# Patient Record
Sex: Female | Born: 1980 | Race: Black or African American | Hispanic: No | Marital: Single | Smoking: Never smoker
Health system: Southern US, Community
[De-identification: ages and names within clinical notes are randomized; demographics above are authoritative.]

## PROBLEM LIST (undated history)

## (undated) DIAGNOSIS — F419 Anxiety disorder, unspecified: Secondary | ICD-10-CM

## (undated) DIAGNOSIS — K8020 Calculus of gallbladder without cholecystitis without obstruction: Secondary | ICD-10-CM

## (undated) DIAGNOSIS — U071 COVID-19: Secondary | ICD-10-CM

## (undated) DIAGNOSIS — R519 Headache, unspecified: Secondary | ICD-10-CM

## (undated) DIAGNOSIS — K219 Gastro-esophageal reflux disease without esophagitis: Secondary | ICD-10-CM

## (undated) DIAGNOSIS — K2970 Gastritis, unspecified, without bleeding: Secondary | ICD-10-CM

## (undated) DIAGNOSIS — E785 Hyperlipidemia, unspecified: Secondary | ICD-10-CM

## (undated) DIAGNOSIS — G8929 Other chronic pain: Secondary | ICD-10-CM

## (undated) DIAGNOSIS — N200 Calculus of kidney: Secondary | ICD-10-CM

## (undated) HISTORY — DX: COVID-19: U07.1

## (undated) HISTORY — PX: KNEE SURGERY: SHX244

## (undated) HISTORY — DX: Other chronic pain: G89.29

## (undated) HISTORY — DX: Gastritis, unspecified, without bleeding: K29.70

## (undated) HISTORY — DX: Headache, unspecified: R51.9

## (undated) HISTORY — DX: Calculus of kidney: N20.0

## (undated) HISTORY — DX: Hyperlipidemia, unspecified: E78.5

## (undated) HISTORY — PX: COLONOSCOPY: SHX174

## (undated) HISTORY — DX: Gastro-esophageal reflux disease without esophagitis: K21.9

## (undated) HISTORY — DX: Calculus of gallbladder without cholecystitis without obstruction: K80.20

---

## 2002-12-10 ENCOUNTER — Encounter: Payer: Self-pay | Admitting: Emergency Medicine

## 2002-12-10 ENCOUNTER — Emergency Department (HOSPITAL_COMMUNITY): Admission: EM | Admit: 2002-12-10 | Discharge: 2002-12-11 | Payer: Self-pay | Admitting: Emergency Medicine

## 2003-04-21 ENCOUNTER — Inpatient Hospital Stay (HOSPITAL_COMMUNITY): Admission: AD | Admit: 2003-04-21 | Discharge: 2003-04-21 | Payer: Self-pay | Admitting: *Deleted

## 2003-05-15 ENCOUNTER — Emergency Department (HOSPITAL_COMMUNITY): Admission: EM | Admit: 2003-05-15 | Discharge: 2003-05-15 | Payer: Self-pay | Admitting: Emergency Medicine

## 2003-09-08 ENCOUNTER — Other Ambulatory Visit: Admission: RE | Admit: 2003-09-08 | Discharge: 2003-09-08 | Payer: Self-pay | Admitting: Obstetrics and Gynecology

## 2004-02-09 ENCOUNTER — Inpatient Hospital Stay (HOSPITAL_COMMUNITY): Admission: AD | Admit: 2004-02-09 | Discharge: 2004-02-09 | Payer: Self-pay | Admitting: *Deleted

## 2004-02-12 ENCOUNTER — Inpatient Hospital Stay (HOSPITAL_COMMUNITY): Admission: AD | Admit: 2004-02-12 | Discharge: 2004-02-15 | Payer: Self-pay | Admitting: Obstetrics and Gynecology

## 2004-08-17 ENCOUNTER — Emergency Department (HOSPITAL_COMMUNITY): Admission: EM | Admit: 2004-08-17 | Discharge: 2004-08-17 | Payer: Self-pay | Admitting: Emergency Medicine

## 2004-08-17 HISTORY — PX: CHOLECYSTECTOMY: SHX55

## 2004-09-12 ENCOUNTER — Ambulatory Visit (HOSPITAL_COMMUNITY): Admission: RE | Admit: 2004-09-12 | Discharge: 2004-09-13 | Payer: Self-pay | Admitting: General Surgery

## 2004-09-12 ENCOUNTER — Encounter (INDEPENDENT_AMBULATORY_CARE_PROVIDER_SITE_OTHER): Payer: Self-pay | Admitting: *Deleted

## 2006-06-12 ENCOUNTER — Other Ambulatory Visit: Admission: RE | Admit: 2006-06-12 | Discharge: 2006-06-12 | Payer: Self-pay | Admitting: Obstetrics and Gynecology

## 2006-09-10 ENCOUNTER — Emergency Department (HOSPITAL_COMMUNITY): Admission: EM | Admit: 2006-09-10 | Discharge: 2006-09-10 | Payer: Self-pay | Admitting: Family Medicine

## 2006-09-24 ENCOUNTER — Emergency Department (HOSPITAL_COMMUNITY): Admission: EM | Admit: 2006-09-24 | Discharge: 2006-09-24 | Payer: Self-pay | Admitting: Emergency Medicine

## 2007-02-15 ENCOUNTER — Emergency Department (HOSPITAL_COMMUNITY): Admission: EM | Admit: 2007-02-15 | Discharge: 2007-02-15 | Payer: Self-pay | Admitting: Emergency Medicine

## 2008-04-27 ENCOUNTER — Emergency Department (HOSPITAL_COMMUNITY): Admission: EM | Admit: 2008-04-27 | Discharge: 2008-04-28 | Payer: Self-pay | Admitting: Emergency Medicine

## 2009-07-07 ENCOUNTER — Emergency Department (HOSPITAL_COMMUNITY): Admission: EM | Admit: 2009-07-07 | Discharge: 2009-07-07 | Payer: Self-pay | Admitting: Family Medicine

## 2009-11-20 ENCOUNTER — Emergency Department (HOSPITAL_COMMUNITY): Admission: EM | Admit: 2009-11-20 | Discharge: 2009-11-20 | Payer: Self-pay | Admitting: Emergency Medicine

## 2010-01-23 ENCOUNTER — Emergency Department (HOSPITAL_COMMUNITY): Admission: EM | Admit: 2010-01-23 | Discharge: 2010-01-24 | Payer: Self-pay | Admitting: Emergency Medicine

## 2010-02-21 ENCOUNTER — Emergency Department (HOSPITAL_COMMUNITY): Admission: EM | Admit: 2010-02-21 | Discharge: 2010-02-22 | Payer: Self-pay | Admitting: Emergency Medicine

## 2010-06-05 ENCOUNTER — Emergency Department (HOSPITAL_COMMUNITY): Admission: EM | Admit: 2010-06-05 | Discharge: 2010-06-05 | Payer: Self-pay | Admitting: Family Medicine

## 2010-09-22 ENCOUNTER — Encounter: Payer: Self-pay | Admitting: Gastroenterology

## 2010-09-27 NOTE — Letter (Signed)
Summary: New Patient letter  [facility]  [address] [place], [place] [address]   Phone: [phone]  Fax: [phone]       09/22/2010 MRN: [id number]  Amy Mendoza [address] [place], [place]  [address]  Dear Ms. Roddy,  Welcome to the Gastroenterology Division at [facility].    You are scheduled to see Dr.  Christella Hartigan on 11-01-09 at 11am on the 3rd floor at [facility], [address]  We ask that you try to arrive at our office 15 minutes prior to your appointment time to allow for check-in.  We would like you to complete the enclosed self-administered evaluation form prior to your visit and bring it with you on the day of your appointment.  We will review it with you.  Also, please bring a complete list of all your medications or, if you prefer, bring the medication bottles and we will list them.  Please bring your insurance card so that we may make a copy of it.  If your insurance requires a referral to see a specialist, please bring your referral form from your primary care physician.  Co-payments are due at the time of your visit and may be paid by cash, check or credit card.     Your office visit will consist of a consult with your physician (includes a physical exam), any laboratory testing he/she may order, scheduling of any necessary diagnostic testing (e.g. x-ray, ultrasound, CT-scan), and scheduling of a procedure (e.g. Endoscopy, Colonoscopy) if required.  Please allow enough time on your schedule to allow for any/all of these possibilities.    If you cannot keep your appointment, please call [phone] to cancel or reschedule prior to your appointment date.  This allows [place] the opportunity to schedule an appointment for another patient in need of care.  If you do not cancel or reschedule by 5 p.m. the business day prior to your appointment date, you will be charged a $50.00 late cancellation/no-show fee.    Thank you for choosing  [facility] for your medical needs.  We appreciate the opportunity to care for you.  Please visit [place] at our website  to learn more about our practice.                     Sincerely,                                                             The Gastroenterology Division

## 2010-09-30 LAB — URINALYSIS, ROUTINE W REFLEX MICROSCOPIC
Hgb urine dipstick: NEGATIVE
Ketones, ur: NEGATIVE mg/dL
Protein, ur: NEGATIVE mg/dL
Urobilinogen, UA: 0.2 mg/dL (ref 0.0–1.0)

## 2010-09-30 LAB — COMPREHENSIVE METABOLIC PANEL
ALT: 21 U/L (ref 0–35)
Calcium: 9 mg/dL (ref 8.4–10.5)
Creatinine, Ser: 0.79 mg/dL (ref 0.4–1.2)
Glucose, Bld: 79 mg/dL (ref 70–99)
Sodium: 136 mEq/L (ref 135–145)
Total Protein: 7.9 g/dL (ref 6.0–8.3)

## 2010-09-30 LAB — DIFFERENTIAL
Lymphocytes Relative: 10 % — ABNORMAL LOW (ref 12–46)
Lymphs Abs: 0.8 10*3/uL (ref 0.7–4.0)
Monocytes Relative: 6 % (ref 3–12)
Neutro Abs: 6.8 10*3/uL (ref 1.7–7.7)
Neutrophils Relative %: 83 % — ABNORMAL HIGH (ref 43–77)

## 2010-09-30 LAB — POCT PREGNANCY, URINE: Preg Test, Ur: NEGATIVE

## 2010-09-30 LAB — CBC
HCT: 42.6 % (ref 36.0–46.0)
MCH: 28.1 pg (ref 26.0–34.0)
MCHC: 33.1 g/dL (ref 30.0–36.0)
RDW: 13 % (ref 11.5–15.5)

## 2010-09-30 LAB — LIPASE, BLOOD: Lipase: 23 U/L (ref 11–59)

## 2010-09-30 LAB — LACTIC ACID, PLASMA: Lactic Acid, Venous: 1.4 mmol/L (ref 0.5–2.2)

## 2010-10-02 LAB — URINALYSIS, ROUTINE W REFLEX MICROSCOPIC
Nitrite: NEGATIVE
Specific Gravity, Urine: 1.023 (ref 1.005–1.030)
Urobilinogen, UA: 1 mg/dL (ref 0.0–1.0)
pH: 5.5 (ref 5.0–8.0)

## 2010-10-02 LAB — DIFFERENTIAL
Basophils Absolute: 0 10*3/uL (ref 0.0–0.1)
Eosinophils Relative: 4 % (ref 0–5)
Lymphocytes Relative: 37 % (ref 12–46)
Monocytes Absolute: 0.4 10*3/uL (ref 0.1–1.0)
Monocytes Relative: 6 % (ref 3–12)
Neutro Abs: 3.6 10*3/uL (ref 1.7–7.7)

## 2010-10-02 LAB — COMPREHENSIVE METABOLIC PANEL
AST: 17 U/L (ref 0–37)
Albumin: 3.5 g/dL (ref 3.5–5.2)
Alkaline Phosphatase: 61 U/L (ref 39–117)
BUN: 6 mg/dL (ref 6–23)
Chloride: 107 mEq/L (ref 96–112)
Creatinine, Ser: 0.83 mg/dL (ref 0.4–1.2)
GFR calc Af Amer: >60 mL/min (ref >60)
Potassium: 3.8 mEq/L (ref 3.5–5.1)
Total Bilirubin: 0.7 mg/dL (ref 0.3–1.2)
Total Protein: 7.2 g/dL (ref 6.0–8.3)

## 2010-10-02 LAB — CBC
Platelets: 233 10*3/uL (ref 150–400)
RBC: 4.49 MIL/uL (ref 3.87–5.11)
RDW: 13 % (ref 11.5–15.5)
WBC: 6.9 10*3/uL (ref 4.0–10.5)

## 2010-11-02 ENCOUNTER — Other Ambulatory Visit (INDEPENDENT_AMBULATORY_CARE_PROVIDER_SITE_OTHER): Payer: No Typology Code available for payment source

## 2010-11-02 ENCOUNTER — Encounter: Payer: Self-pay | Admitting: Gastroenterology

## 2010-11-02 ENCOUNTER — Ambulatory Visit (INDEPENDENT_AMBULATORY_CARE_PROVIDER_SITE_OTHER): Payer: Medicaid Other | Admitting: Gastroenterology

## 2010-11-02 VITALS — BP 106/86 | HR 68 | Ht 70.0 in | Wt 260.0 lb

## 2010-11-02 DIAGNOSIS — K625 Hemorrhage of anus and rectum: Secondary | ICD-10-CM

## 2010-11-02 DIAGNOSIS — K5900 Constipation, unspecified: Secondary | ICD-10-CM

## 2010-11-02 LAB — CBC WITH DIFFERENTIAL/PLATELET
Basophils Absolute: 0 10*3/uL (ref 0.0–0.1)
Basophils Relative: 0.3 % (ref 0.0–3.0)
Eosinophils Absolute: 0.2 10*3/uL (ref 0.0–0.7)
Hemoglobin: 13.5 g/dL (ref 12.0–15.0)
Lymphs Abs: 2.1 10*3/uL (ref 0.7–4.0)
MCHC: 34 g/dL (ref 30.0–36.0)
MCV: 85.4 fl (ref 78.0–100.0)
Monocytes Absolute: 0.4 10*3/uL (ref 0.1–1.0)
Neutro Abs: 2.5 10*3/uL (ref 1.4–7.7)
RBC: 4.64 Mil/uL (ref 3.87–5.11)
RDW: 13 % (ref 11.5–14.6)

## 2010-11-02 LAB — HEPATIC FUNCTION PANEL
Bilirubin, Direct: 0.1 mg/dL (ref 0.0–0.3)
Total Bilirubin: 0.7 mg/dL (ref 0.3–1.2)

## 2010-11-02 LAB — TSH: TSH: 0.91 u[IU]/mL (ref 0.35–5.50)

## 2010-11-02 LAB — BASIC METABOLIC PANEL
Calcium: 9.4 mg/dL (ref 8.4–10.5)
Chloride: 106 mEq/L (ref 96–112)
Creatinine, Ser: 0.9 mg/dL (ref 0.4–1.2)
GFR: 98.02 mL/min (ref >60.00)

## 2010-11-02 MED ORDER — PEG-KCL-NACL-NASULF-NA ASC-C 100 G PO SOLR: 1.0000 | ORAL | Status: DC

## 2010-11-02 NOTE — Patient Instructions (Addendum)
Please start taking citrucel (orange flavored) powder fiber supplement.  This may cause some bloating at first but that usually goes away. Begin with a small spoonful and work your way up to a large, heaping spoonful daily over a week. You will be set up for a colonoscopy. You will have labs checked today in the basement lab.  Please head down after you check out with the front desk  (cbc, bmet, lfts, tsh). A copy of this information will be made available to Dr. Concepcion Elk.

## 2010-11-02 NOTE — Progress Notes (Signed)
HPI: This is a  very pleasant 30 year old woman  Sent by Dr. Tyson Dense  abd pains for 2-3 years.  Had lap chole 2006 for stone.  She has abd pains, cramps (hot/cold sensation) about 1 hour after eating.  Can also occur without eating.  Cramping type pain.  Will have 1-2 solid stools, followed by looser stools.  Can be awoken by pains, sleeps on stomach.    Went to [facility] last summer. Had a CT scan (she was told it was normal appearing).  She had a fever at that time also.  Had normal WBC, CMEt normal exept for Tbili 1.7.  Had same thing 5 years ago in [place], was told she was constipated.  She tends to be quite constipated, can go as long as 7 days.  She has to push and strain a lot.  She has seen blood and mucous in her stool at times.  Overall she has gained 10 pounds in past year.  She takes goody's, but not very frequently.     Review of systems: Pertinent positive and negative review of systems were noted in the above HPI section.  All other review of systems was otherwise negative.   Past Medical History, Past Surgical History, Family History, Social History, Current Medications, Allergies were all reviewed with the patient via [facility] electronic medical record system.   Physical Exam: BP 106/86  Pulse 68  Ht 5\' 10"  (1.778 m)  Wt 260 lb (117.935 kg)  BMI 37.31 kg/m2  LMP 10/14/2010 Constitutional: generally well-appearing, morbidly obese Psychiatric: alert and oriented x3 Eyes: extraocular movements intact Mouth: oral pharynx moist, no lesions Neck: supple no lymphadenopathy Cardiovascular: heart regular rate and rhythm Lungs: clear to auscultation bilaterally Abdomen: soft, nontender, nondistended, no obvious ascites, no peritoneal signs, normal bowel sounds Extremities: no lower extremity edema bilaterally Skin: no lesions on visible extremities    Assessment and plan: 30 y.o. female with lower to mid abdominal pain, cramping, chronic constipation,  intermittent blood per rectum  I suspect most of her symptoms are related to her constipation and I have recommended that she begin fiber supplements. We'll get a basic set of blood work today including a CBC, complete metabolic profile, thyroid testing. Since she has seen blood in her stool we will proceed with colonoscopy at her since convenience.

## 2010-11-21 ENCOUNTER — Ambulatory Visit (AMBULATORY_SURGERY_CENTER): Payer: Medicaid Other | Admitting: Gastroenterology

## 2010-11-21 ENCOUNTER — Encounter: Payer: Self-pay | Admitting: Gastroenterology

## 2010-11-21 VITALS — BP 110/77 | HR 84 | Temp 98.8°F | Resp 20 | Ht 69.0 in | Wt 260.0 lb

## 2010-11-21 DIAGNOSIS — K625 Hemorrhage of anus and rectum: Secondary | ICD-10-CM

## 2010-11-21 DIAGNOSIS — K5900 Constipation, unspecified: Secondary | ICD-10-CM

## 2010-11-21 MED ORDER — SODIUM CHLORIDE 0.9 % IV SOLN: 500.0000 mL | INTRAVENOUS | Status: DC

## 2010-11-21 NOTE — Patient Instructions (Signed)
Discharged instructions given with verbal understanding. Recommend changing eating habits. Eat plenty of fruits and vegetables. Fiber supplements over the counter. Exercise and call back if have any further questions. Resume previous medications.

## 2010-11-22 ENCOUNTER — Telehealth: Payer: Self-pay

## 2010-11-22 NOTE — Telephone Encounter (Signed)
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
## 2010-12-02 NOTE — Op Note (Signed)
NAME:  Amy Mendoza, Amy Mendoza             ACCOUNT NO.:  [id number]   MEDICAL RECORD NO.:  [id number]          PATIENT TYPE:  OIB   LOCATION:  6126                         FACILITY:  [facility]   PHYSICIAN:  Cherylynn Ridges III, M.D.DATE OF BIRTH:  October 16, 1980   DATE OF PROCEDURE:  09/12/2004  DATE OF DISCHARGE:                                 OPERATIVE REPORT   PREOPERATIVE DIAGNOSIS:  Symptomatic cholelithiasis.   POSTOPERATIVE DIAGNOSIS:  Symptomatic cholelithiasis.   PROCEDURE:  Laparoscopic cholecystectomy with cholangiogram.   SURGEON:  Cherylynn Ridges, M.D.   ASSISTANT:  Leonie Man, M.D.   ANESTHESIA:  General endotracheal.   ESTIMATED BLOOD LOSS:  Less than 20 mL.   COMPLICATIONS:  None.   CONDITION:  Good.   INDICATIONS FOR OPERATION:  The patient is a 30 year old with symptomatic  gallstones who comes in for a laparoscopic gallbladder procedure.   FINDINGS:  The patient had a non-acutely inflamed gallbladder with multiple  small 2- to 3-mm stones contained within the gallbladder.  Cholangiogram was  normal with no evidence of common duct obstruction or intraductal filling  defect.   OPERATION:  The patient was taken to the operating room and placed on the  table in a supine position. After an adequate endotracheal anesthetic was  administered, she was prepped and draped in the usual sterile manner,  exposing the midline in the right upper quadrant.   A supraumbilical curvilinear incision was made using #11 blade and taken  down to the midline fascia.  It was at this point that Hasson technique was  assumed.  We grasped the fascia with Kocher clamps, then made an incision in  the fascia between the Kocher clamps and then bluntly dissected down through  the peritoneum using a Kelly clamp.  Once this was done, we placed a  pursestring suture of 0 Vicryl on UR6 needle. We used the Hasson cannula to  pass into the peritoneal cavity and secured it in place with the pursestring  suture.   Once this was done, we passed 2 right costal margin 5-mm cannulas, then a  subxiphoid 11/12-mm cannula under direct vision.  With all cannulas in  place, the patient was placed reversed Trendelenburg, the left side was  tilted down and the dissection begun.   The abdomen was then insufflated up to maximal pressure of 50 mmHg during  the dissection.  We grasped the dome of the gallbladder with a ratcheted  grasper through the lateral-most 5-mm cannula and a speculum was attached to  the infundibulum of the gallbladder.  We dissected out the peritoneum  overlying the triangle of Calot and hepatoduodenal triangle.  This allowed  [place] to dissect out the cystic duct and cystic artery.  Once the cystic duct  was adequately dissected out, we clipped along the gallbladder side, then  made a duodenocholedochotomy using endoscopic scissors.  It was through this  hole in the cystic duct that a Cook catheter which had been passed through  the anterior abdominal wall was passed and secured in place for the  cholangiogram.   We used half-strength Hypaque into the cholangiogram  and this demonstrated  good flow into the duodenum, no common duct obstruction, no intraductal  filling defects and no proximal dilatation.   The cholangiogram catheter was removed a long with this clip and then the  cystic duct was triply clipped distally.  We then transected the cystic duct  and then the cystic artery was clipped and transected.  We then dissected  out the gallbladder from its bed with minimal difficulty and using an  EndoCatch bag to remove it from the supraumbilical site.   Electrocautery was used to obtain hemostasis in the gallbladder bed.  We  ligated the fascia at the supraumbilical site using the pursestring suture.  Once this was done, we injected all sites with 0.25% Marcaine with  epinephrine and then we closed each site with a running subcuticular stitch  of 4-0 Vicryl.  All needle  counts, sponge counts and instrument counts were  correct that the conclusion of the case.  A sterile dressing was applied on  all sites.      JOW/MEDQ  D:  09/12/2004  T:  09/13/2004  Job:  [id number]

## 2010-12-02 NOTE — H&P (Signed)
NAMEJAZLEEN, Amy Mendoza NO.:  [id number]   MEDICAL RECORD NO.:  [id number]                   PATIENT TYPE:  INP   LOCATION:  9163                                 [facility]ILITY[facility]   PHYSICIAN:  Janine Limbo, M.D.            DATE OF BIRTH:  06-25-1981   DATE OF ADMISSION:  02/12/2004  DATE OF DISCHARGE:                                HISTORY & PHYSICAL   This is a 30 year old, gravida 2, para 0-0-1-0 at 18 4/7 weeks who presents  in active labor. She denies leaking or bleeding, reports positive fetal  movement. The pregnancy has been followed by Dr. Su Hilt and remarkable for:  1. Unsure LMP.  2. Asthma.  3. Sickle cell trait.  4. Obesity.  5. Group B strep positive.    HISTORY OF CURRENT PREGNANCY:  The patient entered care at 14 weeks. She had  some vomiting and diarrhea early in the pregnancy which resolved.  She had a  normal 18 week ultrasound.  Later on in the pregnancy, her fundal height was  increased over the normal. Ultrasound was obtained with results pending and  her group B strep was positive.   OB HISTORY:  Remarkable for an elective abortion in 2004.   PAST MEDICAL HISTORY:  Remarkable for occasional yeast infection, childhood  varicella, history of asthma for which she uses albuterol as needed.   PAST SURGICAL HISTORY:  Remarkable for a left knee surgery and an elective  abortion.   FAMILY HISTORY:  Remarkable for a grandmother with heart disease, a mother  with anemia.  Mother with bronchitis, cousins with asthma, uncle with  diabetes, father with diabetes, aunt with a neurologic disorder.   GENETIC HISTORY:  Remarkable for the patient, mother, uncle and cousins with  sickle cell trait and a cousin with sickle cell disease.   SOCIAL HISTORY:  The patient is single but involved with Lorentus Levada Schilling  who is involved and supportive. 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 She denies any alcohol,  tobacco or drug use.   PRENATAL LABS:  Hemoglobin 11.7, platelets 239, blood type O positive,  antibody screen negative, sickle cell negative, RPR nonreactive, rubella  immune, hepatitis negative, HIV negative.  Gonorrhea and chlamydia both  negative.   PHYSICAL EXAMINATION:  VITAL SIGNS:  Stable, afebrile.  HEENT:  Within normal limits.  Thyroid normal not enlarged.  CHEST:  Clear to auscultation.  HEART:  Regular rate and rhythm.  ABDOMEN:  Gravida, vertex __________,  fetal monitor denotes reactive fetal  heart rate with uterine contractions every 2-3 minutes. Cervix is 4-5, 90, -  2, vertex per RN exam.  EXTREMITIES:  Within normal limits.   ASSESSMENT:  1. Intrauterine pregnancy at 40 4/7 weeks.  2. Group B strep positive.  3. Active labor.   PLAN:  1. Admit to birthing suites per Dr. Stefano Gaul.  2. Routine MD orders.  3. Nubain and epidural for analgesia.     Marie L. Williams, C.N.M.                 Janine Limbo, M.D.    MLW/MEDQ  D:  02/12/2004  T:  02/12/2004  Job:  [id number]

## 2011-01-07 ENCOUNTER — Inpatient Hospital Stay (INDEPENDENT_AMBULATORY_CARE_PROVIDER_SITE_OTHER)
Admission: RE | Admit: 2011-01-07 | Discharge: 2011-01-07 | Disposition: A | Discharge status: Home / Self Care | Payer: Self-pay | Source: Ambulatory Visit | Attending: Family Medicine | Admitting: Family Medicine

## 2011-01-07 DIAGNOSIS — M79609 Pain in unspecified limb: Secondary | ICD-10-CM

## 2011-02-13 ENCOUNTER — Telehealth: Payer: Self-pay | Admitting: Gastroenterology

## 2011-02-14 NOTE — Telephone Encounter (Signed)
Left message on machine to call back  

## 2011-02-16 NOTE — Telephone Encounter (Signed)
i agree, thanks 

## 2011-02-16 NOTE — Telephone Encounter (Signed)
Pt called with abd cramping and diarrhea since Saturday.  She says that those are the same symptoms as she had when she saw Dr Christella Hartigan but she found out yesterday that a virus has been going around in her family with the same symptoms.  I advised her to go on clear liquids and call tomorrow if she is no better.  I will send to Dr Christella Hartigan for review.

## 2011-03-08 ENCOUNTER — Emergency Department (HOSPITAL_COMMUNITY)
Admission: EM | Admit: 2011-03-08 | Discharge: 2011-03-09 | Disposition: A | Discharge status: Home / Self Care | Payer: No Typology Code available for payment source | Attending: Emergency Medicine | Admitting: Emergency Medicine

## 2011-03-08 DIAGNOSIS — R10813 Right lower quadrant abdominal tenderness: Secondary | ICD-10-CM | POA: Insufficient documentation

## 2011-03-08 DIAGNOSIS — R110 Nausea: Secondary | ICD-10-CM | POA: Insufficient documentation

## 2011-03-08 DIAGNOSIS — R109 Unspecified abdominal pain: Secondary | ICD-10-CM | POA: Insufficient documentation

## 2011-03-09 LAB — URINALYSIS, ROUTINE W REFLEX MICROSCOPIC
Bilirubin Urine: NEGATIVE
Glucose, UA: NEGATIVE mg/dL
Ketones, ur: NEGATIVE mg/dL
Leukocytes, UA: NEGATIVE
pH: 6 (ref 5.0–8.0)

## 2011-03-13 ENCOUNTER — Telehealth: Payer: Self-pay | Admitting: Gastroenterology

## 2011-03-13 NOTE — Telephone Encounter (Signed)
Left message on machine to call back  

## 2011-03-15 NOTE — Telephone Encounter (Signed)
Left message on machine to call back if she still has questions 

## 2011-03-28 ENCOUNTER — Telehealth: Payer: Self-pay | Admitting: Gastroenterology

## 2011-03-28 DIAGNOSIS — R1013 Epigastric pain: Secondary | ICD-10-CM

## 2011-03-28 DIAGNOSIS — R194 Change in bowel habit: Secondary | ICD-10-CM

## 2011-03-28 NOTE — Telephone Encounter (Signed)
Pt having epigastric pain with cramping and alternating constipation, diarrhea.  Normal colon in April this year.  She says she feels hungry even after she eats and it feels "like something pulling in the upper abdomen".  Please advise

## 2011-03-29 NOTE — Telephone Encounter (Signed)
Ok, thanks.

## 2011-03-29 NOTE — Telephone Encounter (Signed)
Pt is taking daily fiber and will be in today for labs

## 2011-03-29 NOTE — Telephone Encounter (Signed)
Is she still taking daily fiber supplements?   She needs celiac panel, total IgA checked and should be on daily citrucel.

## 2011-04-17 LAB — CBC
Hemoglobin: 13.6
MCHC: 33.5
MCV: 83.4
RBC: 4.87

## 2011-04-17 LAB — POCT I-STAT, CHEM 8
Calcium, Ion: 1.08 — ABNORMAL LOW
Chloride: 100
Creatinine, Ser: 1
Glucose, Bld: 98
HCT: 43

## 2011-04-17 LAB — DIFFERENTIAL
Basophils Relative: 0
Lymphs Abs: 1.7
Monocytes Absolute: 0.4
Monocytes Relative: 10
Neutro Abs: 2

## 2011-05-08 ENCOUNTER — Inpatient Hospital Stay (INDEPENDENT_AMBULATORY_CARE_PROVIDER_SITE_OTHER)
Admission: RE | Admit: 2011-05-08 | Discharge: 2011-05-08 | Disposition: A | Discharge status: Home / Self Care | Payer: Self-pay | Source: Ambulatory Visit | Attending: Emergency Medicine | Admitting: Emergency Medicine

## 2011-05-08 DIAGNOSIS — N760 Acute vaginitis: Secondary | ICD-10-CM

## 2011-05-08 LAB — POCT URINALYSIS DIP (DEVICE)
Hgb urine dipstick: NEGATIVE
Protein, ur: NEGATIVE mg/dL
Specific Gravity, Urine: 1.02 (ref 1.005–1.030)
Urobilinogen, UA: 0.2 mg/dL (ref 0.0–1.0)
pH: 7.5 (ref 5.0–8.0)

## 2011-05-08 LAB — WET PREP, GENITAL: Clue Cells Wet Prep HPF POC: NONE SEEN

## 2011-05-08 LAB — POCT PREGNANCY, URINE: Preg Test, Ur: NEGATIVE

## 2011-05-09 LAB — GC/CHLAMYDIA PROBE AMP, GENITAL
Chlamydia, DNA Probe: NEGATIVE
GC Probe Amp, Genital: NEGATIVE

## 2012-04-08 ENCOUNTER — Emergency Department (INDEPENDENT_AMBULATORY_CARE_PROVIDER_SITE_OTHER)
Admission: EM | Admit: 2012-04-08 | Discharge: 2012-04-08 | Disposition: A | Discharge status: Home / Self Care | Payer: Self-pay | Source: Home / Self Care | Attending: Family Medicine | Admitting: Family Medicine

## 2012-04-08 ENCOUNTER — Encounter (HOSPITAL_COMMUNITY): Payer: Self-pay | Admitting: *Deleted

## 2012-04-08 DIAGNOSIS — M79651 Pain in right thigh: Secondary | ICD-10-CM

## 2012-04-08 DIAGNOSIS — M545 Low back pain: Secondary | ICD-10-CM

## 2012-04-08 DIAGNOSIS — G5711 Meralgia paresthetica, right lower limb: Secondary | ICD-10-CM

## 2012-04-08 DIAGNOSIS — M79609 Pain in unspecified limb: Secondary | ICD-10-CM

## 2012-04-08 DIAGNOSIS — G5710 Meralgia paresthetica, unspecified lower limb: Secondary | ICD-10-CM

## 2012-04-08 LAB — TSH: TSH: 1.233 u[IU]/mL (ref 0.350–4.500)

## 2012-04-08 MED ORDER — PREDNISONE 20 MG PO TABS: ORAL_TABLET | ORAL | Status: DC

## 2012-04-08 MED ORDER — ALBUTEROL SULFATE (5 MG/ML) 0.5% IN NEBU: 5.0000 mg | INHALATION_SOLUTION | Freq: Once | RESPIRATORY_TRACT | Status: DC

## 2012-04-08 MED ORDER — CYCLOBENZAPRINE HCL 10 MG PO TABS: 10.0000 mg | ORAL_TABLET | Freq: Two times a day (BID) | ORAL | Status: DC | PRN

## 2012-04-08 MED ORDER — NAPROXEN 500 MG PO TABS: 500.0000 mg | ORAL_TABLET | Freq: Two times a day (BID) | ORAL | Status: DC

## 2012-04-08 MED ORDER — TRAMADOL HCL 50 MG PO TABS: 50.0000 mg | ORAL_TABLET | Freq: Four times a day (QID) | ORAL | Status: DC | PRN

## 2012-04-08 MED ORDER — IPRATROPIUM BROMIDE 0.02 % IN SOLN: 0.5000 mg | Freq: Once | RESPIRATORY_TRACT | Status: DC

## 2012-04-08 NOTE — ED Provider Notes (Signed)
History     CSN: [id number]  Arrival date & time 04/08/12  1107   First MD Initiated Contact with Patient 04/08/12 1242      Chief Complaint  Patient presents with  . Leg Pain    (Consider location/radiation/quality/duration/timing/severity/associated sxs/prior treatment) HPI Comments: 31 year old female with history of obesity here complaining of 2 weeks history of right thigh pain associated with right lower back pain. Patient denies any recent increase in her physical activity or known injury. 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 She describes her pain as a burning type superficial sensation in the side and anterior to her right upper thigh, pain starts close to the pelvic area. She denies dysuria or hematuria. She denies pelvic pain or vaginal discharge. Pain is worse with superficial touching of the thigh skin. Denies significant difficulty with walking or limping. You have problems with muscle aches in the past. Denies taking statins. Patient has tried several times to start a walking routine to help with her obesity.   Past Medical History  Diagnosis Date  . Asthma   . Gallstones   . Hyperlipemia     Past Surgical History  Procedure Date  . Cholecystectomy feb 2006  . Knee surgery     left    Family History  Problem Relation Age of Onset  . Diabetes Father     MGM, maternal uncle  . Prostate cancer Maternal Grandfather   . Breast cancer Maternal Aunt   . Colon cancer Neg Hx     History  Substance Use Topics  . Smoking status: Never Smoker   . Smokeless tobacco: Never Used  . Alcohol Use: No     occasionally    OB History    Grav Para Term Preterm Abortions TAB SAB Ect Mult Living                  Review of Systems  Constitutional: Negative for fever and chills.  Gastrointestinal: Negative for nausea, vomiting, abdominal pain, diarrhea,  constipation and abdominal distention.  Genitourinary: Negative for dysuria, frequency, hematuria, flank pain, vaginal bleeding, vaginal discharge, menstrual problem and pelvic pain.  Musculoskeletal: Positive for myalgias and back pain. Negative for joint swelling.  Skin: Negative for rash.  Neurological: Negative for dizziness and headaches.  All other systems reviewed and are negative.    Allergies  Review of patient's allergies indicates no known allergies.  Home Medications   Current Outpatient Rx  Name Route Sig Dispense Refill  . CYCLOBENZAPRINE HCL 10 MG PO TABS Oral Take 1 tablet (10 mg total) by mouth 2 (two) times daily as needed for muscle spasms. 20 tablet 0  . ULTIMATE PROBIOTIC FORMULA PO CAPS Oral Take 1 capsule by mouth daily as needed.      Marland Kitchen NAPROXEN 500 MG PO TABS Oral Take 1 tablet (500 mg total) by mouth 2 (two) times daily. 20 tablet 0  . PREDNISONE 20 MG PO TABS  2 tabs by mouth daily for 5 days. 10 tablet 0  . TRAMADOL HCL 50 MG PO TABS Oral Take 1 tablet (50 mg total) by mouth every 6 (six) hours as needed for pain. 15 tablet 0    BP 115/61  Pulse 64  Temp 98.6 F (37 C) (Oral)  Resp 20  SpO2 100%  LMP 03/18/2012  Physical Exam  Nursing note  and vitals reviewed. Constitutional: She is oriented to person, place, and time. She appears well-developed and well-nourished. No distress.  HENT:  Head: Normocephalic.  Mouth/Throat: Oropharynx is clear and moist.  Eyes: Conjunctivae normal are normal.  Neck: Neck supple.       Thyroid feels full. No focal nodes palpated.  Cardiovascular: Normal heart sounds.   Pulmonary/Chest: Breath sounds normal.  Abdominal: Soft. Bowel sounds are normal. She exhibits no distension. There is no tenderness.       No costovertebral tenderness. No inguinal hernias.  Musculoskeletal:       Spine: Central. Fair range of motion despite morbid obesity. No pain with palpation of bone processes. Impress increased muscle tone  and tenderness to palpation over right lumbar paravertebral muscles.  Right hip/right thigh: Fair internal and external rotation of the right hip despite of morbid obesity. No clunks or crepitus. Negative straight leg test. Impress hyperesthesia to light touch over upper anterior and lateral thigh. No numbness. Specifically no saddle anesthesia. Otherwise entire right lower extremity appears neurovascularly intact.   Lymphadenopathy:    She has no cervical adenopathy.  Neurological: She is alert and oriented to person, place, and time.  Skin: No rash noted. She is not diaphoretic.    ED Course  Procedures (including critical care time)   Labs Reviewed  TSH   No results found.   1. Right thigh pain   2. Meralgia paresthetica of right side   3. Low back pain radiating to right leg       MDM  Impress low back pain associated with peripheral neuropathy. Has otherwise no history or clinical findings suggestive of cord compression. Decided to treat with Flexeril, naproxen, prednisone and tramadol. Asked to followup with a primary care provider or spine specialist if persistent or worsening symptoms. Encouraged back strengthening exercises and weight management.        Sharin Grave, MD 04/10/12 [phone]

## 2012-04-08 NOTE — ED Notes (Signed)
Pt reports  r  Leg pain  With  Pain radiating  Down r  Leg   denys  Any  Injury    Symptoms  X  2  Weeks   Ambulatory    To  Exam   Room     With     Slow  Steady  Gait

## 2013-03-14 ENCOUNTER — Ambulatory Visit: Payer: Self-pay | Admitting: Gastroenterology

## 2013-03-14 ENCOUNTER — Telehealth: Payer: Self-pay | Admitting: Gastroenterology

## 2013-03-14 NOTE — Telephone Encounter (Signed)
No do not bill

## 2013-08-07 ENCOUNTER — Telehealth: Payer: Self-pay | Admitting: Gastroenterology

## 2013-08-08 NOTE — Telephone Encounter (Signed)
Periumbilical pain "like contractions" and constipation, early satiety. Nausea all the time Off/on for a month.  Nothing makes it worse or better, she tried to alter her diet with no response PCP gave protonix and carafate and only had relief for about a week.  Appt with Amy for 08/12/13 3 pm

## 2013-08-12 ENCOUNTER — Ambulatory Visit: Payer: Self-pay | Admitting: Physician Assistant

## 2013-08-12 ENCOUNTER — Telehealth: Payer: Self-pay | Admitting: Gastroenterology

## 2013-08-12 ENCOUNTER — Ambulatory Visit: Payer: Self-pay | Admitting: Gastroenterology

## 2013-08-12 NOTE — Telephone Encounter (Signed)
Do not bill 

## 2013-09-12 ENCOUNTER — Ambulatory Visit: Payer: Self-pay | Admitting: Gastroenterology

## 2013-10-10 ENCOUNTER — Ambulatory Visit: Payer: Self-pay | Admitting: Gastroenterology

## 2014-02-09 ENCOUNTER — Ambulatory Visit: Payer: Self-pay | Admitting: Family Medicine

## 2014-02-23 ENCOUNTER — Emergency Department (HOSPITAL_COMMUNITY): Payer: Medicaid Other

## 2014-02-23 ENCOUNTER — Encounter (HOSPITAL_COMMUNITY): Payer: Self-pay | Admitting: Emergency Medicine

## 2014-02-23 ENCOUNTER — Emergency Department (HOSPITAL_COMMUNITY)
Admission: EM | Admit: 2014-02-23 | Discharge: 2014-02-23 | Disposition: A | Discharge status: Home / Self Care | Payer: Medicaid Other | Attending: Emergency Medicine | Admitting: Emergency Medicine

## 2014-02-23 DIAGNOSIS — Y9389 Activity, other specified: Secondary | ICD-10-CM | POA: Diagnosis not present

## 2014-02-23 DIAGNOSIS — W230XXA Caught, crushed, jammed, or pinched between moving objects, initial encounter: Secondary | ICD-10-CM | POA: Diagnosis not present

## 2014-02-23 DIAGNOSIS — Z862 Personal history of diseases of the blood and blood-forming organs and certain disorders involving the immune mechanism: Secondary | ICD-10-CM | POA: Insufficient documentation

## 2014-02-23 DIAGNOSIS — IMO0002 Reserved for concepts with insufficient information to code with codable children: Secondary | ICD-10-CM | POA: Insufficient documentation

## 2014-02-23 DIAGNOSIS — Y9289 Other specified places as the place of occurrence of the external cause: Secondary | ICD-10-CM | POA: Diagnosis not present

## 2014-02-23 DIAGNOSIS — Z8639 Personal history of other endocrine, nutritional and metabolic disease: Secondary | ICD-10-CM | POA: Insufficient documentation

## 2014-02-23 DIAGNOSIS — S6980XA Other specified injuries of unspecified wrist, hand and finger(s), initial encounter: Secondary | ICD-10-CM | POA: Insufficient documentation

## 2014-02-23 DIAGNOSIS — S6992XA Unspecified injury of left wrist, hand and finger(s), initial encounter: Secondary | ICD-10-CM

## 2014-02-23 DIAGNOSIS — Z791 Long term (current) use of non-steroidal anti-inflammatories (NSAID): Secondary | ICD-10-CM | POA: Insufficient documentation

## 2014-02-23 DIAGNOSIS — Z8719 Personal history of other diseases of the digestive system: Secondary | ICD-10-CM | POA: Diagnosis not present

## 2014-02-23 DIAGNOSIS — S6990XA Unspecified injury of unspecified wrist, hand and finger(s), initial encounter: Principal | ICD-10-CM | POA: Insufficient documentation

## 2014-02-23 DIAGNOSIS — J45909 Unspecified asthma, uncomplicated: Secondary | ICD-10-CM | POA: Insufficient documentation

## 2014-02-23 MED ORDER — HYDROCODONE-ACETAMINOPHEN 5-325 MG PO TABS: 1.0000 | ORAL_TABLET | Freq: Four times a day (QID) | ORAL | Status: DC | PRN

## 2014-02-23 MED ORDER — HYDROCODONE-ACETAMINOPHEN 5-325 MG PO TABS
1.0000 | ORAL_TABLET | Freq: Once | ORAL | Status: AC
  Administered 2014-02-23: 1 via ORAL
  Filled 2014-02-23: qty 1

## 2014-02-23 NOTE — ED Provider Notes (Signed)
Medical screening examination/treatment/procedure(s) were performed by non-physician practitioner and as supervising physician I was immediately available for consultation/collaboration.   EKG Interpretation None        Dehaven Sine, MD 02/23/14 0620 

## 2014-02-23 NOTE — ED Provider Notes (Signed)
CSN: [id number]     Arrival date & time 02/23/14  0028 History   First MD Initiated Contact with Patient 02/23/14 0050     Chief Complaint  Patient presents with  . Finger Injury     (Consider location/radiation/quality/duration/timing/severity/associated sxs/prior Treatment) HPI Comments: Patient presents emergency department with chief complaint of finger jammed. She states that her left thumb was closed in a closet door this evening by her son. She reports moderate pain. It is worse with movement. She has not tried taking anything to alleviate her symptoms. She denies any bleeding or wounds. The pain radiates to her hand.  The history is provided by the patient. No language interpreter was used.    Past Medical History  Diagnosis Date  . Asthma   . Gallstones   . Hyperlipemia    Past Surgical History  Procedure Laterality Date  . Cholecystectomy  feb 2006  . Knee surgery      left   Family History  Problem Relation Age of Onset  . Diabetes Father     MGM, maternal uncle  . Prostate cancer Maternal Grandfather   . Breast cancer Maternal Aunt   . Colon cancer Neg Hx    History  Substance Use Topics  . Smoking status: Never Smoker   . Smokeless tobacco: Never Used  . Alcohol Use: No     Comment: occasionally   OB History   Grav Para Term Preterm Abortions TAB SAB Ect Mult Living                 Review of Systems  Constitutional: Negative for fever and chills.  Respiratory: Negative for shortness of breath.   Cardiovascular: Negative for chest pain.  Gastrointestinal: Negative for nausea, vomiting, diarrhea and constipation.  Genitourinary: Negative for dysuria.  Musculoskeletal: Positive for arthralgias.      Allergies  Review of patient's allergies indicates no known allergies.  Home Medications   Prior to Admission medications   Medication Sig Start Date End Date Taking? Authorizing Provider  cyclobenzaprine (FLEXERIL) 10 MG tablet Take 1 tablet (10  mg total) by mouth 2 (two) times daily as needed for muscle spasms. 04/08/12   Adlih Moreno-Coll, MD  HYDROcodone-acetaminophen (NORCO/VICODIN) 5-325 MG per tablet Take 1 tablet by mouth every 6 (six) hours as needed for moderate pain or severe pain. 02/23/14   Roxy Horsemanobert Kentavius Dettore, PA-C  Lactobacillus (ULTIMATE PROBIOTIC FORMULA) CAPS Take 1 capsule by mouth daily as needed.      Historical Provider, MD  naproxen (NAPROSYN) 500 MG tablet Take 1 tablet (500 mg total) by mouth 2 (two) times daily. 04/08/12   Adlih Moreno-Coll, MD  predniSONE (DELTASONE) 20 MG tablet 2 tabs by mouth daily for 5 days. 04/08/12   Adlih Moreno-Coll, MD  traMADol (ULTRAM) 50 MG tablet Take 1 tablet (50 mg total) by mouth every 6 (six) hours as needed for pain. 04/08/12   Adlih Moreno-Coll, MD   BP 123/68  Pulse 70  Temp(Src) 97.7 F (36.5 C) (Oral)  Resp 14  Ht 5\' 8"  (1.727 m)  Wt 262 lb (118.842 kg)  BMI 39.85 kg/m2  SpO2 98%  LMP 12/02/2013 Physical Exam  Nursing note and vitals reviewed. Constitutional: She is oriented to person, place, and time. She appears well-developed and well-nourished.  HENT:  Head: Normocephalic and atraumatic.  Eyes: Conjunctivae and EOM are normal.  Neck: Normal range of motion.  Cardiovascular: Normal rate.   Pulmonary/Chest: Effort normal.  Abdominal: She exhibits no distension.  Musculoskeletal:  Normal range of motion.  Left thumb range of motion and strength 5/5, however moderately tender to palpation over the distal phalanx  Neurological: She is alert and oriented to person, place, and time.  Skin: Skin is dry.  Very small subungual hematoma of the left thumbnail  Psychiatric: She has a normal mood and affect. Her behavior is normal. Judgment and thought content normal.    ED Course  Procedures (including critical care time) Labs Review Labs Reviewed - No data to display  Imaging Review Dg Finger Thumb Left  02/23/2014   CLINICAL DATA:  Smashed left thumb in closet door,  with bruising and pain.  EXAM: LEFT THUMB 2+V  COMPARISON:  None.  FINDINGS: The left thumb appears grossly intact. There is no evidence of fracture or dislocation. Known soft tissue injury is not well characterized on radiograph. Visualized joint spaces are preserved.  IMPRESSION: No evidence of fracture or dislocation.   Electronically Signed   By: Roanna Raider M.D.   On: 02/23/2014 01:55     EKG Interpretation None      MDM   Final diagnoses:  Jammed finger (interphalangeal joint), left, initial encounter    Patient with injured finger after jamming it in a door. No broken bones. No laceration. Discharged home with ice and pain medicine.    Roxy Horseman, PA-C 02/23/14 [phone]

## 2014-02-23 NOTE — ED Notes (Signed)
Pt to Xray.

## 2014-02-23 NOTE — ED Notes (Signed)
Pt reports slamming finger into door.  Hematoma to left thumb nail.  CNS intact.  Limited ROM secondary to pain.

## 2014-02-23 NOTE — Discharge Instructions (Signed)
Fingertip Injuries and Amputations Fingertip injuries are common and often get injured because they are last to escape when pulling your hand out of harm's way. You have amputated (cut off) part of your finger. How this turns out depends largely on how much was amputated. If just the tip is amputated, often the end of the finger will grow back and the finger may return to much the same as it was before the injury.  If more of the finger is missing, your caregiver has done the best with the tissue remaining to allow you to keep as much finger as is possible. Your caregiver after checking your injury has tried to leave you with a painless fingertip that has durable, feeling skin. If possible, your caregiver has tried to maintain the finger's length and appearance and preserve its fingernail.  Please read the instructions outlined below and refer to this sheet in the next few weeks. These instructions provide you with general information on caring for yourself. Your caregiver may also give you specific instructions. While your treatment has been done according to the most current medical practices available, unavoidable complications occasionally occur. If you have any problems or questions after discharge, please call your caregiver. HOME CARE INSTRUCTIONS   You may resume normal diet and activities as directed or allowed.  Keep your hand elevated above the level of your heart. This helps decrease pain and swelling.  Keep ice packs (or a bag of ice wrapped in a towel) on the injured area for 15-20 minutes, 03-04 times per day, for the first two days.  Change dressings if necessary or as directed.  Clean the wound daily or as directed.  Only take over-the-counter or prescription medicines for pain, discomfort, or fever as directed by your caregiver.  Keep appointments as directed. SEEK IMMEDIATE MEDICAL CARE IF:  You develop redness, swelling, numbness or increasing pain in the wound.  There is  pus coming from the wound.  You develop an unexplained oral temperature above 102 F (38.9 C) or as your caregiver suggests.  There is a foul (bad) smell coming from the wound or dressing.  There is a breaking open of the wound (edges not staying together) after sutures or staples have been removed. MAKE SURE YOU:   Understand these instructions.  Will watch your condition.  Will get help right away if you are not doing well or get worse. Document Released: 05/24/2005 Document Revised: 09/25/2011 Document Reviewed: 04/22/2008 [facility] Patient Information 2015 [place]. This information is not intended to replace advice given to you by your health care provider. Make sure you discuss any questions you have with your health care provider.  Blunt Trauma You have been evaluated for injuries. You have been examined and your caregiver has not found injuries serious enough to require hospitalization. It is common to have multiple bruises and sore muscles following an accident. These tend to feel worse for the first 24 hours. You will feel more stiffness and soreness over the next several hours and worse when you wake up the first morning after your accident. After this point, you should begin to improve with each passing day. The amount of improvement depends on the amount of damage done in the accident. Following your accident, if some part of your body does not work as it should, or if the pain in any area continues to increase, you should return to the Emergency Department for re-evaluation.  HOME CARE INSTRUCTIONS  Routine care for sore areas should  include:  Ice to sore areas every 2 hours for 20 minutes while awake for the next 2 days.  Drink extra fluids (not alcohol).  Take a hot or warm shower or bath once or twice a day to increase blood flow to sore muscles. This will help you "limber up".  Activity as tolerated. Lifting may aggravate neck or back pain.  Only take  over-the-counter or prescription medicines for pain, discomfort, or fever as directed by your caregiver. Do not use aspirin. This may increase bruising or increase bleeding if there are small areas where this is happening. SEEK IMMEDIATE MEDICAL CARE IF:  Numbness, tingling, weakness, or problem with the use of your arms or legs.  A severe headache is not relieved with medications.  There is a change in bowel or bladder control.  Increasing pain in any areas of the body.  Short of breath or dizzy.  Nauseated, vomiting, or sweating.  Increasing belly (abdominal) discomfort.  Blood in urine, stool, or vomiting blood.  Pain in either shoulder in an area where a shoulder strap would be.  Feelings of lightheadedness or if you have a fainting episode. Sometimes it is not possible to identify all injuries immediately after the trauma. It is important that you continue to monitor your condition after the emergency department visit. If you feel you are not improving, or improving more slowly than should be expected, call your physician. If you feel your symptoms (problems) are worsening, return to the Emergency Department immediately. Document Released: 03/29/2001 Document Revised: 09/25/2011 Document Reviewed: 02/19/2008 [facility] Patient Information 2015 [place], [place]. This information is not intended to replace advice given to you by your health care provider. Make sure you discuss any questions you have with your health care provider.

## 2014-02-23 NOTE — ED Notes (Signed)
Pt. accidentally injured her left thumb against closet door this evening , presents with mild swelling and bruise at nailbed .

## 2014-04-07 DIAGNOSIS — R05 Cough: Secondary | ICD-10-CM | POA: Insufficient documentation

## 2014-04-07 DIAGNOSIS — Z8639 Personal history of other endocrine, nutritional and metabolic disease: Secondary | ICD-10-CM | POA: Insufficient documentation

## 2014-04-07 DIAGNOSIS — Z8719 Personal history of other diseases of the digestive system: Secondary | ICD-10-CM | POA: Insufficient documentation

## 2014-04-07 DIAGNOSIS — Z862 Personal history of diseases of the blood and blood-forming organs and certain disorders involving the immune mechanism: Secondary | ICD-10-CM | POA: Insufficient documentation

## 2014-04-07 DIAGNOSIS — Z791 Long term (current) use of non-steroidal anti-inflammatories (NSAID): Secondary | ICD-10-CM | POA: Diagnosis not present

## 2014-04-07 DIAGNOSIS — Z792 Long term (current) use of antibiotics: Secondary | ICD-10-CM | POA: Diagnosis not present

## 2014-04-07 DIAGNOSIS — J159 Unspecified bacterial pneumonia: Secondary | ICD-10-CM | POA: Diagnosis not present

## 2014-04-07 DIAGNOSIS — R059 Cough, unspecified: Secondary | ICD-10-CM | POA: Insufficient documentation

## 2014-04-07 DIAGNOSIS — J45909 Unspecified asthma, uncomplicated: Secondary | ICD-10-CM | POA: Insufficient documentation

## 2014-04-07 DIAGNOSIS — D689 Coagulation defect, unspecified: Secondary | ICD-10-CM | POA: Insufficient documentation

## 2014-04-08 ENCOUNTER — Emergency Department (HOSPITAL_COMMUNITY): Payer: Medicaid Other

## 2014-04-08 ENCOUNTER — Encounter (HOSPITAL_COMMUNITY): Payer: Self-pay | Admitting: Emergency Medicine

## 2014-04-08 ENCOUNTER — Emergency Department (HOSPITAL_COMMUNITY)
Admission: EM | Admit: 2014-04-08 | Discharge: 2014-04-08 | Disposition: A | Discharge status: Home / Self Care | Payer: Medicaid Other | Attending: Emergency Medicine | Admitting: Emergency Medicine

## 2014-04-08 DIAGNOSIS — D689 Coagulation defect, unspecified: Secondary | ICD-10-CM

## 2014-04-08 DIAGNOSIS — J189 Pneumonia, unspecified organism: Secondary | ICD-10-CM

## 2014-04-08 LAB — CBC WITH DIFFERENTIAL/PLATELET
Basophils Absolute: 0 10*3/uL (ref 0.0–0.1)
Basophils Relative: 1 % (ref 0–1)
EOS ABS: 0.2 10*3/uL (ref 0.0–0.7)
EOS PCT: 3 % (ref 0–5)
HEMATOCRIT: 37.6 % (ref 36.0–46.0)
HEMOGLOBIN: 12.6 g/dL (ref 12.0–15.0)
LYMPHS ABS: 2.8 10*3/uL (ref 0.7–4.0)
Lymphocytes Relative: 47 % — ABNORMAL HIGH (ref 12–46)
MCH: 27.5 pg (ref 26.0–34.0)
MCHC: 33.5 g/dL (ref 30.0–36.0)
MCV: 81.9 fL (ref 78.0–100.0)
MONO ABS: 0.4 10*3/uL (ref 0.1–1.0)
MONOS PCT: 7 % (ref 3–12)
Neutro Abs: 2.5 10*3/uL (ref 1.7–7.7)
Neutrophils Relative %: 42 % — ABNORMAL LOW (ref 43–77)
Platelets: 259 10*3/uL (ref 150–400)
RBC: 4.59 MIL/uL (ref 3.87–5.11)
RDW: 12.8 % (ref 11.5–15.5)
WBC: 6 10*3/uL (ref 4.0–10.5)

## 2014-04-08 MED ORDER — LEVOFLOXACIN 750 MG PO TABS
750.0000 mg | ORAL_TABLET | Freq: Once | ORAL | Status: AC
  Administered 2014-04-08: 750 mg via ORAL
  Filled 2014-04-08: qty 1

## 2014-04-08 MED ORDER — LEVOFLOXACIN 750 MG PO TABS: 750.0000 mg | ORAL_TABLET | Freq: Every day | ORAL | Status: DC

## 2014-04-08 NOTE — Discharge Instructions (Signed)
Your chest x-ray shows the start of a pneumonia, your blood work shows that you are not anemic.  This was drawn as a baseline.  As discussed please make an appointment with your primary care physician to be seen in a week.  You've also been given prescriptions for antibiotic.  Please take this on regular, basis, on a daily basis for the next 6 days.  If at anytime, you become worse, you start coughing, more blood, you feel weak, or dizzy.  Please return for further evaluation

## 2014-04-08 NOTE — ED Provider Notes (Signed)
Medical screening examination/treatment/procedure(s) were performed by non-physician practitioner and as supervising physician I was immediately available for consultation/collaboration.   EKG Interpretation None        Loren Racer, MD 04/08/14 [phone]

## 2014-04-08 NOTE — ED Provider Notes (Signed)
CSN: [id number]     Arrival date & time 04/07/14  2351 History   First MD Initiated Contact with Patient 04/08/14 0301     Chief Complaint  Patient presents with  . Cough     (Consider location/radiation/quality/duration/timing/severity/associated sxs/prior Treatment) HPI Comments: Patient started with harsh cough and hemoptysis this evening denies trauma, smoking, chemical exposure   But does report central chest discomfort   Patient is a 33 y.o. female presenting with cough. The history is provided by the patient.  Cough Cough characteristics:  Harsh Severity:  Moderate Onset quality:  Gradual Timing:  Intermittent Chronicity:  New Smoker: no   Relieved by:  Rest Worsened by:  Activity Ineffective treatments:  None tried Associated symptoms: no fever, no rash, no shortness of breath and no wheezing   Associated symptoms comment:  Hemoptysis Risk factors: no recent infection and no recent travel     Past Medical History  Diagnosis Date  . Asthma   . Gallstones   . Hyperlipemia    Past Surgical History  Procedure Laterality Date  . Cholecystectomy  feb 2006  . Knee surgery      left   Family History  Problem Relation Age of Onset  . Diabetes Father     MGM, maternal uncle  . Prostate cancer Maternal Grandfather   . Breast cancer Maternal Aunt   . Colon cancer Neg Hx    History  Substance Use Topics  . Smoking status: Never Smoker   . Smokeless tobacco: Never Used  . Alcohol Use: No     Comment: occasionally   OB History   Grav Para Term Preterm Abortions TAB SAB Ect Mult Living                 Review of Systems  Constitutional: Negative for fever.  Respiratory: Positive for cough. Negative for shortness of breath and wheezing.   Gastrointestinal: Negative for nausea, vomiting and abdominal pain.  Skin: Negative for rash.  Neurological: Negative for dizziness.      Allergies  Review of patient's allergies indicates no known allergies.  Home  Medications   Prior to Admission medications   Medication Sig Start Date End Date Taking? Authorizing Provider  naproxen (NAPROSYN) 250 MG tablet Take 500 mg by mouth daily as needed for mild pain.   Yes Historical Provider, MD  levofloxacin (LEVAQUIN) 750 MG tablet Take 1 tablet (750 mg total) by mouth daily. 04/08/14   Arman Filter, NP   BP 115/71  Pulse 64  Temp(Src) 98.8 F (37.1 C) (Oral)  Resp 16  SpO2 100%  LMP 02/14/2014 Physical Exam  Nursing note and vitals reviewed. Constitutional: She appears well-developed and well-nourished.  HENT:  Head: Normocephalic.  Mouth/Throat: Oropharynx is clear and moist.  Eyes: Pupils are equal, round, and reactive to light.  Neck: Normal range of motion.  Cardiovascular: Normal rate and regular rhythm.   Pulmonary/Chest: Effort normal and breath sounds normal. No respiratory distress. She has no wheezes. She exhibits no tenderness.  Musculoskeletal: Normal range of motion.  Neurological: She is alert.  Skin: Skin is warm and dry. No rash noted. No erythema.    ED Course  Procedures (including critical care time) Labs Review Labs Reviewed  CBC WITH DIFFERENTIAL - Abnormal; Notable for the following:    Neutrophils Relative % 42 (*)    Lymphocytes Relative 47 (*)    All other components within normal limits    Imaging Review Dg Chest 2 View  04/08/2014  CLINICAL DATA:  Cough.  Mid chest pain  EXAM: CHEST  2 VIEW  COMPARISON:  06/05/2010  FINDINGS: There is a subtle, roughly rounded opacity in the mid right chest. No effusion. Normal heart size and mediastinal contours. Cholecystectomy changes.  IMPRESSION: Question early pneumonia on the right.   Electronically Signed   By: Tiburcio Pea M.D.   On: 04/08/2014 01:16     EKG Interpretation None     PAteint has PCP at [facility]  Will obtain CBC for base line start antibiotic and have patient FU with PCP in 3-5 days  To return if develops fever, worsening hemoptysis  MDM    Final diagnoses:  CAP (community acquired pneumonia)  Hemostasis disorder         Arman Filter, NP 04/08/14 [phone]

## 2014-04-08 NOTE — ED Notes (Signed)
Pt. reports productive cough with bloody phlegm onset this evening , deneies SOB / respirations unlabored. No fever or chills.

## 2014-04-08 NOTE — ED Notes (Signed)
Pt reports choking at dinner, causing a coughing fit.  Since then, throughout the night, pt reports coughing up "blood clots".  Pt states food she choked on was soft.  Pt reports some chest pain, located in mid chest, described as burning.  Denies SOB.  NAD, respirations equal and unlabored, skin warm and dry.

## 2014-08-11 ENCOUNTER — Emergency Department (HOSPITAL_COMMUNITY): Payer: Medicaid Other

## 2014-08-11 ENCOUNTER — Encounter (HOSPITAL_COMMUNITY): Payer: Self-pay | Admitting: Emergency Medicine

## 2014-08-11 ENCOUNTER — Emergency Department (HOSPITAL_COMMUNITY)
Admission: EM | Admit: 2014-08-11 | Discharge: 2014-08-11 | Disposition: A | Discharge status: Home / Self Care | Payer: Medicaid Other | Attending: Emergency Medicine | Admitting: Emergency Medicine

## 2014-08-11 DIAGNOSIS — M545 Low back pain: Secondary | ICD-10-CM | POA: Diagnosis not present

## 2014-08-11 DIAGNOSIS — J45909 Unspecified asthma, uncomplicated: Secondary | ICD-10-CM | POA: Diagnosis not present

## 2014-08-11 DIAGNOSIS — Z8639 Personal history of other endocrine, nutritional and metabolic disease: Secondary | ICD-10-CM | POA: Diagnosis not present

## 2014-08-11 DIAGNOSIS — Z3202 Encounter for pregnancy test, result negative: Secondary | ICD-10-CM | POA: Diagnosis not present

## 2014-08-11 DIAGNOSIS — R1032 Left lower quadrant pain: Secondary | ICD-10-CM | POA: Diagnosis present

## 2014-08-11 DIAGNOSIS — K5900 Constipation, unspecified: Secondary | ICD-10-CM | POA: Diagnosis not present

## 2014-08-11 LAB — COMPREHENSIVE METABOLIC PANEL
ALBUMIN: 3.4 g/dL — AB (ref 3.5–5.2)
ALK PHOS: 56 U/L (ref 39–117)
ALT: 15 U/L (ref 0–35)
AST: 18 U/L (ref 0–37)
Anion gap: 5 (ref 5–15)
BUN: 11 mg/dL (ref 6–23)
CALCIUM: 8.7 mg/dL (ref 8.4–10.5)
CHLORIDE: 106 mmol/L (ref 96–112)
CO2: 27 mmol/L (ref 19–32)
CREATININE: 0.83 mg/dL (ref 0.50–1.10)
GFR calc Af Amer: >90 mL/min (ref >90)
GFR calc non Af Amer: >90 mL/min (ref >90)
GLUCOSE: 101 mg/dL — AB (ref 70–99)
POTASSIUM: 3.6 mmol/L (ref 3.5–5.1)
Sodium: 138 mmol/L (ref 135–145)
TOTAL PROTEIN: 6.7 g/dL (ref 6.0–8.3)
Total Bilirubin: 0.6 mg/dL (ref 0.3–1.2)

## 2014-08-11 LAB — URINALYSIS, ROUTINE W REFLEX MICROSCOPIC
BILIRUBIN URINE: NEGATIVE
GLUCOSE, UA: NEGATIVE mg/dL
HGB URINE DIPSTICK: NEGATIVE
KETONES UR: NEGATIVE mg/dL
Leukocytes, UA: NEGATIVE
NITRITE: NEGATIVE
PROTEIN: NEGATIVE mg/dL
Specific Gravity, Urine: 1.03 (ref 1.005–1.030)
Urobilinogen, UA: 1 mg/dL (ref 0.0–1.0)
pH: 5.5 (ref 5.0–8.0)

## 2014-08-11 LAB — CBC WITH DIFFERENTIAL/PLATELET
BASOS PCT: 0 % (ref 0–1)
Basophils Absolute: 0 10*3/uL (ref 0.0–0.1)
EOS PCT: 2 % (ref 0–5)
Eosinophils Absolute: 0.1 10*3/uL (ref 0.0–0.7)
HEMATOCRIT: 38.3 % (ref 36.0–46.0)
HEMOGLOBIN: 12.9 g/dL (ref 12.0–15.0)
Lymphocytes Relative: 51 % — ABNORMAL HIGH (ref 12–46)
Lymphs Abs: 3 10*3/uL (ref 0.7–4.0)
MCH: 28.4 pg (ref 26.0–34.0)
MCHC: 33.7 g/dL (ref 30.0–36.0)
MCV: 84.4 fL (ref 78.0–100.0)
MONO ABS: 0.4 10*3/uL (ref 0.1–1.0)
MONOS PCT: 6 % (ref 3–12)
Neutro Abs: 2.3 10*3/uL (ref 1.7–7.7)
Neutrophils Relative %: 40 % — ABNORMAL LOW (ref 43–77)
PLATELETS: 226 10*3/uL (ref 150–400)
RBC: 4.54 MIL/uL (ref 3.87–5.11)
RDW: 12.7 % (ref 11.5–15.5)
WBC: 5.9 10*3/uL (ref 4.0–10.5)

## 2014-08-11 LAB — LIPASE, BLOOD: Lipase: 33 U/L (ref 11–59)

## 2014-08-11 LAB — PREGNANCY, URINE: PREG TEST UR: NEGATIVE

## 2014-08-11 MED ORDER — DICYCLOMINE HCL 20 MG PO TABS: 20.0000 mg | ORAL_TABLET | Freq: Two times a day (BID) | ORAL | Status: DC | PRN

## 2014-08-11 MED ORDER — POLYETHYLENE GLYCOL 3350 17 G PO PACK: 17.0000 g | PACK | Freq: Every day | ORAL | Status: DC

## 2014-08-11 NOTE — Discharge Instructions (Signed)

## 2014-08-11 NOTE — ED Notes (Signed)
Pt a/o x 4 on d/c with steady gait. 

## 2014-08-11 NOTE — ED Provider Notes (Signed)
CSN: [id number]     Arrival date & time 08/11/14  0014 History  This chart was scribed for Amy Mendoza Lonisha Bobby, MD by Annye AsaAnna Dorsett, ED Scribe. This patient was seen in room D34C/D34C and the patient's care was started at 2:11 AM.     Chief Complaint  Patient presents with  . Abdominal Pain   Patient is a 34 y.o. female presenting with abdominal pain. The history is provided by the patient. No language interpreter was used.  Abdominal Pain Associated symptoms: no chest pain, no chills, no constipation, no cough, no diarrhea, no dysuria, no fever, no nausea, no shortness of breath, no vaginal bleeding, no vaginal discharge and no vomiting      HPI Comments: Amy Mendoza is a 34 y.o. female who presents to the Emergency Department complaining of 4 months of "sharp" intermittent LLQ pain. Tonight her pain is also in her left lower back. She does not associate this pain with food intake. She denies nausea, hematuria, urinary retention, fever, chills. Currently pain is improved.  Patient had a cholecystectomy in 2006.   Past Medical History  Diagnosis Date  . Asthma   . Gallstones   . Hyperlipemia    Past Surgical History  Procedure Laterality Date  . Cholecystectomy  feb 2006  . Knee surgery      left   Family History  Problem Relation Age of Onset  . Diabetes Father     MGM, maternal uncle  . Prostate cancer Maternal Grandfather   . Breast cancer Maternal Aunt   . Colon cancer Neg Hx    History  Substance Use Topics  . Smoking status: Never Smoker   . Smokeless tobacco: Never Used  . Alcohol Use: No     Comment: occasionally   OB History    No data available     Review of Systems  Constitutional: Negative for fever and chills.  Respiratory: Negative for cough and shortness of breath.   Cardiovascular: Negative for chest pain.  Gastrointestinal: Positive for abdominal pain. Negative for nausea, vomiting, diarrhea, constipation and blood in stool.  Genitourinary:  Negative for dysuria, flank pain, vaginal bleeding, vaginal discharge and pelvic pain.  Musculoskeletal: Positive for back pain. Negative for myalgias, joint swelling, neck pain and neck stiffness.  Skin: Negative for rash and wound.  Neurological: Negative for dizziness, weakness, light-headedness, numbness and headaches.  All other systems reviewed and are negative.  Allergies  Review of patient's allergies indicates no known allergies.  Home Medications   Prior to Admission medications   Medication Sig Start Date End Date Taking? Authorizing Provider  naproxen (NAPROSYN) 250 MG tablet Take 500 mg by mouth daily as needed for mild pain.   Yes Historical Provider, MD  dicyclomine (BENTYL) 20 MG tablet Take 1 tablet (20 mg total) by mouth 2 (two) times daily as needed for spasms. 08/11/14   Amy Mendoza Marlita Keil, MD  levofloxacin (LEVAQUIN) 750 MG tablet Take 1 tablet (750 mg total) by mouth daily. Patient not taking: Reported on 08/11/2014 04/08/14   Arman FilterGail K Schulz, NP  polyethylene glycol [facility](MIRALAX / Ethelene HalGLYCOLAX) packet Take 17 g by mouth daily. 08/11/14   Amy Mendoza Lille Karim, MD   BP 115/61 mmHg  Pulse 72  Temp(Src) 98.2 F (36.8 C) (Oral)  Resp 16  Ht 5\' 8"  (1.727 m)  Wt 260 lb (117.935 kg)  BMI 39.54 kg/m2  SpO2 96%  LMP 07/21/2014 Physical Exam  Constitutional: She is oriented to person, place, and time. She appears well-developed and  well-nourished. No distress.  HENT:  Head: Normocephalic and atraumatic.  Mouth/Throat: Oropharynx is clear and moist.  Eyes: EOM are normal. Pupils are equal, round, and reactive to light.  Neck: Normal range of motion. Neck supple.  Cardiovascular: Normal rate and regular rhythm.   Pulmonary/Chest: Effort normal and breath sounds normal. No respiratory distress. She has no wheezes. She has no rales. She exhibits no tenderness.  Abdominal: Soft. Bowel sounds are normal. She exhibits no distension and no mass. There is no tenderness. There is no rebound and no  guarding.  Musculoskeletal: Normal range of motion. She exhibits no edema or tenderness.  No bilateral CVA tenderness to percussion.  Neurological: She is alert and oriented to person, place, and time.  Skin: Skin is warm and dry. No rash noted. No erythema.  Psychiatric: She has a normal mood and affect. Her behavior is normal.  Nursing note and vitals reviewed.   ED Course  Procedures   DIAGNOSTIC STUDIES: Oxygen Saturation is 98% on RA, normal by my interpretation.    COORDINATION OF CARE: 2:14 AM Discussed treatment plan with pt at bedside and pt agreed to plan.   Labs Review Labs Reviewed  CBC WITH DIFFERENTIAL/PLATELET - Abnormal; Notable for the following:    Neutrophils Relative % 40 (*)    Lymphocytes Relative 51 (*)    All other components within normal limits  COMPREHENSIVE METABOLIC PANEL - Abnormal; Notable for the following:    Glucose, Bld 101 (*)    Albumin 3.4 (*)    All other components within normal limits  LIPASE, BLOOD  PREGNANCY, URINE  URINALYSIS, ROUTINE W REFLEX MICROSCOPIC    Imaging Review Dg Abd 1 View  08/11/2014   CLINICAL DATA:  Left lower quadrant abdominal pain and left-sided back pain.  EXAM: ABDOMEN - 1 VIEW  COMPARISON:  CT abdomen and pelvis 02/21/2010.  FINDINGS: Stool-filled colon. The bowel gas pattern is normal. No radio-opaque calculi or other significant radiographic abnormality are seen.  IMPRESSION: Stool-filled colon.  Nonobstructive bowel gas pattern.   Electronically Signed   By: Burman Nieves M.D.   On: 08/11/2014 04:04     EKG Interpretation None      MDM   Final diagnoses:  Constipation, unspecified constipation type    I personally performed the services described in this documentation, which was scribed in my presence. The recorded information has been reviewed and considered.  Patient is well-appearing with soft abdomen. Laboratory workup is unrevealing. Plain film with stool filled colon. We'll treat for  constipation. Return precautions given.   Amy Racer, MD 08/11/14 [phone]

## 2014-08-11 NOTE — ED Notes (Signed)
Pt. reports chronic intermittent LLQ pain for several months , denies nausea or vomitting / no diarrhea , denies fever or chills.

## 2014-09-04 ENCOUNTER — Encounter (HOSPITAL_COMMUNITY): Payer: Self-pay | Admitting: *Deleted

## 2014-09-04 ENCOUNTER — Emergency Department (HOSPITAL_COMMUNITY)
Admission: EM | Admit: 2014-09-04 | Discharge: 2014-09-04 | Disposition: A | Discharge status: Home / Self Care | Payer: Medicaid Other | Attending: Emergency Medicine | Admitting: Emergency Medicine

## 2014-09-04 DIAGNOSIS — Z79899 Other long term (current) drug therapy: Secondary | ICD-10-CM | POA: Insufficient documentation

## 2014-09-04 DIAGNOSIS — R5383 Other fatigue: Secondary | ICD-10-CM | POA: Diagnosis not present

## 2014-09-04 DIAGNOSIS — J45909 Unspecified asthma, uncomplicated: Secondary | ICD-10-CM | POA: Insufficient documentation

## 2014-09-04 DIAGNOSIS — Z8719 Personal history of other diseases of the digestive system: Secondary | ICD-10-CM | POA: Diagnosis not present

## 2014-09-04 DIAGNOSIS — R52 Pain, unspecified: Secondary | ICD-10-CM | POA: Insufficient documentation

## 2014-09-04 DIAGNOSIS — R5381 Other malaise: Secondary | ICD-10-CM | POA: Diagnosis not present

## 2014-09-04 DIAGNOSIS — R51 Headache: Secondary | ICD-10-CM

## 2014-09-04 DIAGNOSIS — R42 Dizziness and giddiness: Secondary | ICD-10-CM | POA: Diagnosis not present

## 2014-09-04 DIAGNOSIS — I10 Essential (primary) hypertension: Secondary | ICD-10-CM | POA: Insufficient documentation

## 2014-09-04 DIAGNOSIS — Z8639 Personal history of other endocrine, nutritional and metabolic disease: Secondary | ICD-10-CM | POA: Insufficient documentation

## 2014-09-04 DIAGNOSIS — R519 Headache, unspecified: Secondary | ICD-10-CM

## 2014-09-04 MED ORDER — CEPHALEXIN 500 MG PO CAPS: 500.0000 mg | ORAL_CAPSULE | Freq: Four times a day (QID) | ORAL | Status: DC

## 2014-09-04 MED ORDER — KETOROLAC TROMETHAMINE 30 MG/ML IJ SOLN
30.0000 mg | Freq: Once | INTRAMUSCULAR | Status: AC
  Administered 2014-09-04: 30 mg via INTRAVENOUS
  Filled 2014-09-04: qty 1

## 2014-09-04 MED ORDER — TRAMADOL HCL 50 MG PO TABS: 50.0000 mg | ORAL_TABLET | Freq: Four times a day (QID) | ORAL | Status: DC | PRN

## 2014-09-04 MED ORDER — SODIUM CHLORIDE 0.9 % IV BOLUS (SEPSIS)
1000.0000 mL | Freq: Once | INTRAVENOUS | Status: AC
  Administered 2014-09-04: 1000 mL via INTRAVENOUS

## 2014-09-04 NOTE — ED Notes (Signed)
Pt reports pain to back of her head, tender to touch. Also had dizziness last night, checked bp at work this am and it was elevated.

## 2014-09-04 NOTE — Discharge Instructions (Signed)
You are being treated for a possible infection of the scalp. Please take and complete the entire course of antibiotics. Reviewed the information below 2 understand these symptoms and signs of a shingles infection.   Your headache today does not appear to be life-threatening or require hospitalization, but often the exact cause of headaches is not determined in the emergency department. Therefore, follow-up with your doctor is very important to find out what may have caused your headache, and whether or not you need any further diagnostic testing or treatment. Sometimes headaches can appear benign (not harmful), but then more serious symptoms can develop which should prompt an immediate re-evaluation by your doctor or the emergency department. SEEK MEDICAL ATTENTION IF: You develop possible problems with medications prescribed.  The medications don't resolve your headache, if it recurs , or if you have multiple episodes of vomiting or can't take fluids. You have a change from the usual headache. RETURN IMMEDIATELY IF you develop a sudden, severe headache or confusion, become poorly responsive or faint, develop a fever above 100.3F or problem breathing, have a change in speech, vision, swallowing, or understanding, or develop new weakness, numbness, tingling, incoordination, or have a seizure.  Cellulitis Cellulitis is an infection of the skin and the tissue beneath it. The infected area is usually red and tender. Cellulitis occurs most often in the arms and lower legs.  CAUSES  Cellulitis is caused by bacteria that enter the skin through cracks or cuts in the skin. The most common types of bacteria that cause cellulitis are staphylococci and streptococci. SIGNS AND SYMPTOMS   Redness and warmth.  Swelling.  Tenderness or pain.  Fever. DIAGNOSIS  Your health care provider can usually determine what is wrong based on a physical exam. Blood tests may also be done. TREATMENT  Treatment usually  involves taking an antibiotic medicine. HOME CARE INSTRUCTIONS   Take your antibiotic medicine as directed by your health care provider. Finish the antibiotic even if you start to feel better.  Keep the infected arm or leg elevated to reduce swelling.  Apply a warm cloth to the affected area up to 4 times per day to relieve pain.  Take medicines only as directed by your health care provider.  Keep all follow-up visits as directed by your health care provider. SEEK MEDICAL CARE IF:   You notice red streaks coming from the infected area.  Your red area gets larger or turns dark in color.  Your bone or joint underneath the infected area becomes painful after the skin has healed.  Your infection returns in the same area or another area.  You notice a swollen bump in the infected area.  You develop new symptoms.  You have a fever. SEEK IMMEDIATE MEDICAL CARE IF:   You feel very sleepy.  You develop vomiting or diarrhea.  You have a general ill feeling (malaise) with muscle aches and pains. MAKE SURE YOU:   Understand these instructions.  Will watch your condition.  Will get help right away if you are not doing well or get worse. Document Released: 04/12/2005 Document Revised: 11/17/2013 Document Reviewed: 09/18/2011 [facility] Patient Information 2015 [place], [place]. This information is not intended to replace advice given to you by your health care provider. Make sure you discuss any questions you have with your health care provider.  Shingles Shingles (herpes zoster) is an infection that is caused by the same virus that causes chickenpox (varicella). The infection causes a painful skin rash and fluid-filled blisters, which  eventually break open, crust over, and heal. It may occur in any area of the body, but it usually affects only one side of the body or face. The pain of shingles usually lasts about 1 month. However, some people with shingles may develop long-term (chronic)  pain in the affected area of the body. Shingles often occurs many years after the person had chickenpox. It is more common:  In people older than 50 years.  In people with weakened immune systems, such as those with HIV, AIDS, or cancer.  In people taking medicines that weaken the immune system, such as transplant medicines.  In people under great stress. CAUSES  Shingles is caused by the varicella zoster virus (VZV), which also causes chickenpox. After a person is infected with the virus, it can remain in the person's body for years in an inactive state (dormant). To cause shingles, the virus reactivates and breaks out as an infection in a nerve root. The virus can be spread from person to person (contagious) through contact with open blisters of the shingles rash. It will only spread to people who have not had chickenpox. When these people are exposed to the virus, they may develop chickenpox. They will not develop shingles. Once the blisters scab over, the person is no longer contagious and cannot spread the virus to others. SIGNS AND SYMPTOMS  Shingles shows up in stages. The initial symptoms may be pain, itching, and tingling in an area of the skin. This pain is usually described as burning, stabbing, or throbbing.In a few days or weeks, a painful red rash will appear in the area where the pain, itching, and tingling were felt. The rash is usually on one side of the body in a band or belt-like pattern. Then, the rash usually turns into fluid-filled blisters. They will scab over and dry up in approximately 2-3 weeks. Flu-like symptoms may also occur with the initial symptoms, the rash, or the blisters. These may include:  Fever.  Chills.  Headache.  Upset stomach. DIAGNOSIS  Your health care provider will perform a skin exam to diagnose shingles. Skin scrapings or fluid samples may also be taken from the blisters. This sample will be examined under a microscope or sent to a lab for  further testing. TREATMENT  There is no specific cure for shingles. Your health care provider will likely prescribe medicines to help you manage the pain, recover faster, and avoid long-term problems. This may include antiviral drugs, anti-inflammatory drugs, and pain medicines. HOME CARE INSTRUCTIONS   Take a cool bath or apply cool compresses to the area of the rash or blisters as directed. This may help with the pain and itching.   Take medicines only as directed by your health care provider.   Rest as directed by your health care provider.  Keep your rash and blisters clean with mild soap and cool water or as directed by your health care provider.  Do not pick your blisters or scratch your rash. Apply an anti-itch cream or numbing creams to the affected area as directed by your health care provider.  Keep your shingles rash covered with a loose bandage (dressing).  Avoid skin contact with:  Babies.   Pregnant women.   Children with eczema.   Elderly people with transplants.   People with chronic illnesses, such as leukemia or AIDS.   Wear loose-fitting clothing to help ease the pain of material rubbing against the rash.  Keep all follow-up visits as directed by your health  care provider.If the area involved is on your face, you may receive a referral for a specialist, such as an eye doctor (ophthalmologist) or an ear, nose, and throat (ENT) doctor. Keeping all follow-up visits will help you avoid eye problems, chronic pain, or disability.  SEEK IMMEDIATE MEDICAL CARE IF:   You have facial pain, pain around the eye area, or loss of feeling on one side of your face.  You have ear pain or ringing in your ear.  You have loss of taste.  Your pain is not relieved with prescribed medicines.   Your redness or swelling spreads.   You have more pain and swelling.  Your condition is worsening or has changed.   You have a fever. MAKE SURE YOU:  Understand  these instructions.  Will watch your condition.  Will get help right away if you are not doing well or get worse. Document Released: 07/03/2005 Document Revised: 11/17/2013 Document Reviewed: 02/15/2012 [facility] Patient Information 2015 [place]. This information is not intended to replace advice given to you by your health care provider. Make sure you discuss any questions you have with your health care provider.

## 2014-09-04 NOTE — ED Provider Notes (Signed)
CSN: [id number]     Arrival date & time 09/04/14  [id number] History   First MD Initiated Contact with Patient 09/04/14 [phone]     Chief Complaint  Patient presents with  . Headache  . Hypertension  . Dizziness     (Consider location/radiation/quality/duration/timing/severity/associated sxs/prior Treatment) Patient is a 34 y.o. female presenting with headaches, hypertension, and dizziness. The history is provided by the patient and medical records.  Headache Associated symptoms: dizziness and fatigue   Associated symptoms: no abdominal pain, no diarrhea, no fever, no myalgias, no nausea, no numbness and no vomiting   Hypertension Associated symptoms include fatigue and headaches. Pertinent negatives include no abdominal pain, arthralgias, chest pain, chills, fever, myalgias, nausea, numbness, rash or vomiting.  Dizziness Associated symptoms: headaches   Associated symptoms: no chest pain, no diarrhea, no nausea, no shortness of breath and no vomiting    the patient presents with complaint of scalp pain. The patient states that 2 days ago she developed pain in the posterior occipital scalp. She states that it is tender to palpation. She denies headache. She states that the pain radiates down to her year and jaw when she presses on it. It is nontender when she doesn't touch it. She denies any lesions of the scalp, itching, burning, or electrical sensations consistent with neurologic pain. She did have the chickenpox as a child. The patient also complains of dizziness, which she states it describes as feeling malaise and fatigue. She denies vertiginous symptoms or ataxia, visual disturbances, difficulty with speech or unilateral weakness. Ice trauma to the head. She denies any recent pill processing techniques or blow dryer burns. She has no other complaints at this time.  Past Medical History  Diagnosis Date  . Asthma   . Gallstones   . Hyperlipemia    Past Surgical History  Procedure Laterality  Date  . Cholecystectomy  feb 2006  . Knee surgery      left   Family History  Problem Relation Age of Onset  . Diabetes Father     MGM, maternal uncle  . Prostate cancer Maternal Grandfather   . Breast cancer Maternal Aunt   . Colon cancer Neg Hx    History  Substance Use Topics  . Smoking status: Never Smoker   . Smokeless tobacco: Never Used  . Alcohol Use: No     Comment: occasionally   OB History    No data available     Review of Systems  Constitutional: Positive for fatigue. Negative for fever and chills.  HENT: Negative for trouble swallowing.   Respiratory: Negative for shortness of breath.   Cardiovascular: Negative for chest pain.  Gastrointestinal: Negative for nausea, vomiting, abdominal pain, diarrhea and constipation.  Genitourinary: Negative for dysuria and hematuria.  Musculoskeletal: Negative for myalgias and arthralgias.  Skin: Negative for rash.  Neurological: Positive for dizziness and headaches. Negative for numbness.  All other systems reviewed and are negative.     Allergies  Review of patient's allergies indicates no known allergies.  Home Medications   Prior to Admission medications   Medication Sig Start Date End Date Taking? Authorizing Provider  dicyclomine (BENTYL) 20 MG tablet Take 1 tablet (20 mg total) by mouth 2 (two) times daily as needed for spasms. 08/11/14   Loren Raceravid Yelverton, MD  levofloxacin (LEVAQUIN) 750 MG tablet Take 1 tablet (750 mg total) by mouth daily. Patient not taking: Reported on 08/11/2014 04/08/14   Arman FilterGail K Schulz, NP  naproxen (NAPROSYN) 250 MG tablet Take  500 mg by mouth daily as needed for mild pain.    Historical Provider, MD  polyethylene glycol (MIRALAX / GLYCOLAX) packet Take 17 g by mouth daily. 08/11/14   Loren Racer, MD   BP 127/71 mmHg  Pulse 69  Temp(Src) 98.7 F (37.1 C) (Oral)  Resp 18  SpO2 98%  LMP 07/21/2014 Physical Exam  Constitutional: She is oriented to person, place, and time. She  appears well-developed and well-nourished. No distress.  HENT:  Head: Normocephalic and atraumatic.    Eyes: Conjunctivae are normal. No scleral icterus.  Neck: Normal range of motion.  Cardiovascular: Normal rate, regular rhythm and normal heart sounds.  Exam reveals no gallop and no friction rub.   No murmur heard. Pulmonary/Chest: Effort normal and breath sounds normal. No respiratory distress.  Abdominal: Soft. Bowel sounds are normal. She exhibits no distension and no mass. There is no tenderness. There is no guarding.  Neurological: She is alert and oriented to person, place, and time.  Skin: Skin is warm and dry. She is not diaphoretic.    ED Course  Procedures (including critical care time) Labs Review Labs Reviewed - No data to display  Imaging Review No results found.   EKG Interpretation None      MDM   Final diagnoses:  Scalp tenderness  Malaise and fatigue    11:06 AM BP 112/73 mmHg  Pulse 64  Temp(Src) 98.7 F (37.1 C) (Oral)  Resp 13  SpO2 100%  LMP 07/21/2014 Physical with point tenderness on cervical, posterior occiput. Small lesions consistent with excoriation as patient has significant dandruff. There is no evidence of rash. Scalp tenderness is focal. The central area of tenderness. Feels soft and "mushy" however, does not feel fluctuant. I doubt shingles as the cause of her scalp pain. However, I have discussed signs. The patient should watch for. I feel the patient most likely has developing infection of the scalp and we will treat her for cellulitis with Keflex. Discharged with tramadol. I discussed reasons for the patient to return to the emergency department immediately.  The patient appears reasonably screened and/or stabilized for discharge and I doubt any other medical condition or other [facility] requiring further screening, evaluation, or treatment in the ED at this time prior to discharge.     Arthor Captain, PA-C 09/04/14 1112  Nathan R.  Rubin Payor, MD 09/08/14 [phone]

## 2014-09-04 NOTE — ED Notes (Signed)
PA at bedside.

## 2014-09-04 NOTE — ED Notes (Signed)
Pt states that she feels "less dizzy"

## 2014-10-27 ENCOUNTER — Emergency Department (HOSPITAL_COMMUNITY): Payer: Medicaid Other

## 2014-10-27 ENCOUNTER — Encounter (HOSPITAL_COMMUNITY): Payer: Self-pay | Admitting: Emergency Medicine

## 2014-10-27 ENCOUNTER — Emergency Department (HOSPITAL_COMMUNITY)
Admission: EM | Admit: 2014-10-27 | Discharge: 2014-10-27 | Disposition: A | Discharge status: Home / Self Care | Payer: Medicaid Other | Attending: Emergency Medicine | Admitting: Emergency Medicine

## 2014-10-27 DIAGNOSIS — Z8639 Personal history of other endocrine, nutritional and metabolic disease: Secondary | ICD-10-CM | POA: Diagnosis not present

## 2014-10-27 DIAGNOSIS — J45909 Unspecified asthma, uncomplicated: Secondary | ICD-10-CM | POA: Diagnosis not present

## 2014-10-27 DIAGNOSIS — N898 Other specified noninflammatory disorders of vagina: Secondary | ICD-10-CM | POA: Diagnosis not present

## 2014-10-27 DIAGNOSIS — Z79899 Other long term (current) drug therapy: Secondary | ICD-10-CM | POA: Diagnosis not present

## 2014-10-27 DIAGNOSIS — Z792 Long term (current) use of antibiotics: Secondary | ICD-10-CM | POA: Diagnosis not present

## 2014-10-27 DIAGNOSIS — K429 Umbilical hernia without obstruction or gangrene: Secondary | ICD-10-CM

## 2014-10-27 DIAGNOSIS — Z3202 Encounter for pregnancy test, result negative: Secondary | ICD-10-CM | POA: Diagnosis not present

## 2014-10-27 DIAGNOSIS — R1032 Left lower quadrant pain: Secondary | ICD-10-CM | POA: Diagnosis present

## 2014-10-27 LAB — COMPREHENSIVE METABOLIC PANEL
ALK PHOS: 55 U/L (ref 39–117)
ALT: 20 U/L (ref 0–35)
ANION GAP: 5 (ref 5–15)
AST: 18 U/L (ref 0–37)
Albumin: 4.2 g/dL (ref 3.5–5.2)
BUN: 9 mg/dL (ref 6–23)
CO2: 27 mmol/L (ref 19–32)
Calcium: 9.2 mg/dL (ref 8.4–10.5)
Chloride: 107 mmol/L (ref 96–112)
Creatinine, Ser: 0.78 mg/dL (ref 0.50–1.10)
GFR calc non Af Amer: >90 mL/min (ref >90)
Glucose, Bld: 91 mg/dL (ref 70–99)
Potassium: 3.8 mmol/L (ref 3.5–5.1)
Sodium: 139 mmol/L (ref 135–145)
Total Bilirubin: 1.2 mg/dL (ref 0.3–1.2)
Total Protein: 7.9 g/dL (ref 6.0–8.3)

## 2014-10-27 LAB — POC URINE PREG, ED: Preg Test, Ur: NEGATIVE

## 2014-10-27 LAB — CBC WITH DIFFERENTIAL/PLATELET
Basophils Absolute: 0 10*3/uL (ref 0.0–0.1)
Basophils Relative: 0 % (ref 0–1)
Eosinophils Absolute: 0.1 10*3/uL (ref 0.0–0.7)
Eosinophils Relative: 3 % (ref 0–5)
HCT: 41.8 % (ref 36.0–46.0)
Hemoglobin: 13.7 g/dL (ref 12.0–15.0)
LYMPHS ABS: 2 10*3/uL (ref 0.7–4.0)
LYMPHS PCT: 41 % (ref 12–46)
MCH: 28 pg (ref 26.0–34.0)
MCHC: 32.8 g/dL (ref 30.0–36.0)
MCV: 85.3 fL (ref 78.0–100.0)
MONOS PCT: 7 % (ref 3–12)
Monocytes Absolute: 0.3 10*3/uL (ref 0.1–1.0)
Neutro Abs: 2.4 10*3/uL (ref 1.7–7.7)
Neutrophils Relative %: 49 % (ref 43–77)
PLATELETS: 295 10*3/uL (ref 150–400)
RBC: 4.9 MIL/uL (ref 3.87–5.11)
RDW: 13.1 % (ref 11.5–15.5)
WBC: 4.8 10*3/uL (ref 4.0–10.5)

## 2014-10-27 LAB — URINALYSIS, ROUTINE W REFLEX MICROSCOPIC
Bilirubin Urine: NEGATIVE
Glucose, UA: NEGATIVE mg/dL
Hgb urine dipstick: NEGATIVE
Ketones, ur: NEGATIVE mg/dL
LEUKOCYTES UA: NEGATIVE
Nitrite: NEGATIVE
PH: 6.5 (ref 5.0–8.0)
Protein, ur: NEGATIVE mg/dL
SPECIFIC GRAVITY, URINE: 1.007 (ref 1.005–1.030)
UROBILINOGEN UA: 0.2 mg/dL (ref 0.0–1.0)

## 2014-10-27 LAB — WET PREP, GENITAL
TRICH WET PREP: NONE SEEN
Yeast Wet Prep HPF POC: NONE SEEN

## 2014-10-27 MED ORDER — KETOROLAC TROMETHAMINE 60 MG/2ML IM SOLN
60.0000 mg | Freq: Once | INTRAMUSCULAR | Status: AC
  Administered 2014-10-27: 60 mg via INTRAMUSCULAR
  Filled 2014-10-27: qty 2

## 2014-10-27 MED ORDER — IOHEXOL 300 MG/ML  SOLN
25.0000 mL | Freq: Once | INTRAMUSCULAR | Status: AC | PRN
  Administered 2014-10-27: 25 mL via ORAL

## 2014-10-27 MED ORDER — IOHEXOL 300 MG/ML  SOLN
100.0000 mL | Freq: Once | INTRAMUSCULAR | Status: AC | PRN
  Administered 2014-10-27: 100 mL via INTRAVENOUS

## 2014-10-27 NOTE — Discharge Instructions (Signed)
1. Medications: continue Miralax as directed, usual home medications 2. Treatment: rest, drink plenty of fluids,  3. Follow Up: Please followup with your gastroenterology in 3 days for discussion of your diagnoses and further evaluation after today's visit; if you do not have a primary care doctor use the resource guide provided to find one; Please return to the ER for worsening pain, persistent vomiting, high fevers or other concerns   Abdominal Pain, Women Abdominal (stomach, pelvic, or belly) pain can be caused by many things. It is important to tell your doctor:  The location of the pain.  Does it come and go or is it present all the time?  Are there things that start the pain (eating certain foods, exercise)?  Are there other symptoms associated with the pain (fever, nausea, vomiting, diarrhea)? All of this is helpful to know when trying to find the cause of the pain. CAUSES   Stomach: virus or bacteria infection, or ulcer.  Intestine: appendicitis (inflamed appendix), regional ileitis (Crohn's disease), ulcerative colitis (inflamed colon), irritable bowel syndrome, diverticulitis (inflamed diverticulum of the colon), or cancer of the stomach or intestine.  Gallbladder disease or stones in the gallbladder.  Kidney disease, kidney stones, or infection.  Pancreas infection or cancer.  Fibromyalgia (pain disorder).  Diseases of the female organs:  Uterus: fibroid (non-cancerous) tumors or infection.  Fallopian tubes: infection or tubal pregnancy.  Ovary: cysts or tumors.  Pelvic adhesions (scar tissue).  Endometriosis (uterus lining tissue growing in the pelvis and on the pelvic organs).  Pelvic congestion syndrome (female organs filling up with blood just before the menstrual period).  Pain with the menstrual period.  Pain with ovulation (producing an egg).  Pain with an IUD (intrauterine device, birth control) in the uterus.  Cancer of the female  organs.  Functional pain (pain not caused by a disease, may improve without treatment).  Psychological pain.  Depression. DIAGNOSIS  Your doctor will decide the seriousness of your pain by doing an examination.  Blood tests.  X-rays.  Ultrasound.  CT scan (computed tomography, special type of X-ray).  MRI (magnetic resonance imaging).  Cultures, for infection.  Barium enema (dye inserted in the large intestine, to better view it with X-rays).  Colonoscopy (looking in intestine with a lighted tube).  Laparoscopy (minor surgery, looking in abdomen with a lighted tube).  Major abdominal exploratory surgery (looking in abdomen with a large incision). TREATMENT  The treatment will depend on the cause of the pain.   Many cases can be observed and treated at home.  Over-the-counter medicines recommended by your caregiver.  Prescription medicine.  Antibiotics, for infection.  Birth control pills, for painful periods or for ovulation pain.  Hormone treatment, for endometriosis.  Nerve blocking injections.  Physical therapy.  Antidepressants.  Counseling with a psychologist or psychiatrist.  Minor or major surgery. HOME CARE INSTRUCTIONS   Do not take laxatives, unless directed by your caregiver.  Take over-the-counter pain medicine only if ordered by your caregiver. Do not take aspirin because it can cause an upset stomach or bleeding.  Try a clear liquid diet (broth or water) as ordered by your caregiver. Slowly move to a bland diet, as tolerated, if the pain is related to the stomach or intestine.  Have a thermometer and take your temperature several times a day, and record it.  Bed rest and sleep, if it helps the pain.  Avoid sexual intercourse, if it causes pain.  Avoid stressful situations.  Keep your follow-up appointments  and tests, as your caregiver orders.  If the pain does not go away with medicine or surgery, you may  try:  Acupuncture.  Relaxation exercises (yoga, meditation).  Group therapy.  Counseling. SEEK MEDICAL CARE IF:   You notice certain foods cause stomach pain.  Your home care treatment is not helping your pain.  You need stronger pain medicine.  You want your IUD removed.  You feel faint or lightheaded.  You develop nausea and vomiting.  You develop a rash.  You are having side effects or an allergy to your medicine. SEEK IMMEDIATE MEDICAL CARE IF:   Your pain does not go away or gets worse.  You have a fever.  Your pain is felt only in portions of the abdomen. The right side could possibly be appendicitis. The left lower portion of the abdomen could be colitis or diverticulitis.  You are passing blood in your stools (bright red or black tarry stools, with or without vomiting).  You have blood in your urine.  You develop chills, with or without a fever.  You pass out. MAKE SURE YOU:   Understand these instructions.  Will watch your condition.  Will get help right away if you are not doing well or get worse. Document Released: 04/30/2007 Document Revised: 11/17/2013 Document Reviewed: 05/20/2009 [facility] Patient Information 2015 [place], [place]. This information is not intended to replace advice given to you by your health care provider. Make sure you discuss any questions you have with your health care provider.    Emergency Department Resource Guide 1) Find a Doctor and Pay Out of Pocket Although you won't have to find out who is covered by your insurance plan, it is a good idea to ask around and get recommendations. You will then need to call the office and see if the doctor you have chosen will accept you as a new patient and what types of options they offer for patients who are self-pay. Some doctors offer discounts or will set up payment plans for their patients who do not have insurance, but you will need to ask so you aren't surprised when you get to your  appointment.  2) Contact Your Local Health Department Not all health departments have doctors that can see patients for sick visits, but many do, so it is worth a call to see if yours does. If you don't know where your local health department is, you can check in your phone book. The CDC also has a tool to help you locate your state's health department, and many state websites also have listings of all of their local health departments.  3) Find a Walk-in Clinic If your illness is not likely to be very severe or complicated, you may want to try a walk in clinic. These are popping up all over the country in pharmacies, drugstores, and shopping centers. They're usually staffed by nurse practitioners or physician assistants that have been trained to treat common illnesses and complaints. They're usually fairly quick and inexpensive. However, if you have serious medical issues or chronic medical problems, these are probably not your best option.  No Primary Care Doctor: - Call Health Connect at  [phone] - they can help you locate a primary care doctor that  accepts your insurance, provides certain services, etc. - Physician Referral Service- [phone]  Chronic Pain Problems: Organization         Address  Phone   Notes  [facility]  [phone] Patients need to  be referred by their primary care doctor.   Medication Assistance: Organization         Address  Phone   Notes  [facility] Medication [facility] [address] [place], [place] [address] [phone] --Must be a resident of [facility] -- Must have NO insurance coverage whatsoever (no Medicaid/ Medicare, etc.) -- The pt. MUST have a primary care doctor that directs their care regularly and follows them in the community   [facility]ist  [phone]   [employer]  [phone]    Agencies that provide inexpensive medical care: Organization         Address  Phone   Notes  [facility]  [phone]   [facility]    [phone]   [facility] [address] [place], [place] [address] [phone]   [facility] [address] [place], [place] [phone]   Planned Parenthood    [phone]   [facility]    [phone]   [facility]  [address], [place] Phone:  [phone], Fax:  [phone] Hours of Operation:  9 am - 6 pm, M-F.  Also accepts Medicaid/Medicare and self-pay.  [facility]  [address], [place] Phone: [phone], Fax: [phone]. Hours of Operation:  8:30 am - 5:30 pm, M-F.  Also accepts Medicaid and self-pay.  [facility] [place] [address] [place] Phone: [phone]   [facility] [address] Natasha Bence [place], [place] [phone], Ext. [phone] Mondays & Thursdays: 7-9 AM.  First 15 patients are seen on a first come, first serve basis.    Medicaid-accepting [facility] Providers:  Organization         Address  Phone   Notes  [facility] [address], [place] [phone] Also accepts self-pay patients.  [facility] [address] Laurell Josephs [place], [place]  [phone]   [facility] [address], [place] [phone]   [facility] [address] [place] [phone]   Renaye Rakers [address], [place]   [phone] Only accepts [place] Access [place] patients after they have their name applied to their card.   Self-Pay (no insurance) in [facility]:  Organization         Address  Phone   Notes  Sickle Cell Patients, [facility] [address] [place], [place] [phone]   [facility] [address] [place], [place] [phone]   [facility]  [place]  [address], [place] [phone]   [facility]/Dr. Osei-Bonsu  [address], [place] or [address], [place] [phone] Phone number for both [place] and [place] locations is the same.  [facility] [address], [place] [phone]   [facility] [address] [place] or [address] [phone]   [facility] [address] [place] [phone], phone; [phone], fax Sees patients 1st and 3rd Saturday of every month.  Must not qualify for public or private insurance (i.e. Medicaid, Medicare, [place] Health Choice, Veterans' Benefits)  Household income should be  no more than 200% of the poverty level The clinic cannot treat you if you are pregnant or think you are pregnant  Sexually transmitted diseases are not treated at the clinic.    Dental Care: Organization         Address  Phone  Notes  [facility] of [facility] [address] [place], [place] [phone] Accepts children up to age 19 who are enrolled in [place] or [place] Health Choice; pregnant women with a Medicaid card; and children who have applied for Medicaid or [place] Health Choice, but were declined, whose parents can pay a reduced fee at time of service.  [facility] Department of [facility]  [address], [place] [phone] Accepts children up to age 7 who are enrolled in [place] or [place] Health Choice; pregnant women with a Medicaid card; and children who have applied for Medicaid or [place] Health Choice, but were declined, whose parents can pay a reduced fee at time of service.  [facility]  [address] [place], [place] [phone] Patients are seen by appointment only. Walk-ins are not accepted. [facility] will see patients 73 years of age and  older. Monday - Tuesday (8am-5pm) Most Wednesdays (8:30-5pm) $30 per visit, cash only  [facility] Adult Dental Access PROGRAM  [address], [facility] [phone] Patients are seen by appointment only. Walk-ins are not accepted. [facility] will see patients 26 years of age and older. One Wednesday Evening (Monthly: Volunteer Based).  $30 per visit, cash only  [employer]  [phone] for adults; Children under age 5, call Graduate Pediatric Dentistry at [phone]. Children aged 17-14, please call [phone] to request a pediatric application.  Dental services are provided in all areas of dental care including fillings, crowns and bridges, complete and partial dentures, implants, gum treatment, root canals, and extractions. Preventive care is also provided. Treatment is provided to both adults and children. Patients are selected via a lottery and there is often a waiting list.   [facility] [address], [place]  [phone] [email or web address]   [facility] [address] [place], [place] [phone], Ext. [phone] Second and Fourth Thursday of each month, opens at 6:30 AM; Clinic ends at 9 AM.  Patients are seen on a first-come first-served basis, and a limited number are seen during each clinic.   [facility]  [address] Ether Griffins [place], [place] [phone]   Eligibility Requirements You must have lived in [place], [place], or [place] counties for at least the last three months.   You cannot be eligible for state or federal sponsored [place], including [employer], [place], or [employer].   You generally cannot be eligible for healthcare insurance through your employer.    How to apply: Eligibility screenings are held every Tuesday and Wednesday afternoon from 1:00 pm until 4:00 pm. You do not need an appointment for the interview!  [facility] [address]  [place], [place] [phone]   [facility] Health Department  [phone]   [facility] Health Department  [phone]   [facility]  [phone]    Behavioral Health Resources in the Community: Intensive Outpatient Programs Organization         Address  Phone  Notes  [facility] [address] [place], [place] [phone]  [facility] [address] [place], [place]   ADS: Alcohol [facility] Drug [facility] [address], [place], [place]   [place] [address]  [place], [place] or [phone]   Substance Abuse Resources Organization         Address  Phone  Notes  Alcohol and Drug Services  5045884936   [place]  [phone]   [facility] [place]   Chinita Pester  [phone]   Residential & Outpatient Substance Abuse Program  [phone]   Psychological Services Organization         Address  Phone  Notes  [facility] [place]  [place]  [phone]   [place] [address], [place] or [phone]    Mobile Crisis Teams Organization         Address  Phone  Notes  [facility], Mobile Crisis Care Unit  [phone]   [facility]  [address] [place], [place]   [place] [address] [place] [phone]    Self-Help/Support Groups Organization         Address  Phone             Notes  [place] of [place] - variety of support groups  [place] Call for more information  Narcotics Anonymous (NA), [facility] [address], [employer] [place]  2 meetings at this location   Special educational needs teacher         Address  Phone  Notes  [facility]idential [facility] [place],    [place]  [phone]   [facility]  [address] [place] [address]88325, [place], [place]   [place], [place] [phone] Admissions: 8am-3pm M-F  Incentives Substance [place] [address],    [place], [place] [phone]   [facility] [address] [place], [place], [place]   [facility] [address],  [place], [place]   [facility] - Intensive Outpatient [address] Kristeen Mans [address], [place], [place]   [facility] ([place].,  [place], [place] [phone] or [phone]   [facility] Services (RTS) [address], [place], [place] Accepts Medicaid  [facility] [place] [address]  [place] [phone] Substance Abuse/Addiction Treatment   [facility] Organization         Address  Phone  Notes  [facility]  [phone]   Domenic Schwab, PhD [address] Arlis Porta [place], [place]   [phone] or [phone]   [place], [place] [phone]   [facility] [address], [place], [place] [phone] Insurance/Medicaid/sponsorship through [facility] [place] and Families [address] [place]                                    [place], [place] [phone] [place] [address], [place] [phone]    Dr. Adele Schilder  [phone]   [facility] Dept. 1) [address] [place] 2) [place] 3)  [place], [place] [phone]  [phone]   [facility] Child Abuse Hotline 650-110-6647 or [phone] (After Hours)

## 2014-10-27 NOTE — ED Notes (Addendum)
Left flank pain radiating to left abdomin increasing since last November. Has seen her primary MD and they said it may be constipation, she's tried treating herself for constipation with no relief. States she does have a family history of ectopic pregnancies.

## 2014-10-27 NOTE — Progress Notes (Signed)
EDCM spoke to patient at bedside.  Patient listed as having Medicaid insurance.  Patient reports she was unaware she still had Medicaid insurance.  [facility] informed patient that physician listed on her Medicaid card [facility].  Patient reports she had called [facility] to make an appointment but was told she was not able to be seen there anymore because of a missed appointment.  EDCM provided patient with phone number and address of [facility], phone number for DSS and list of pcps who accept Medicaid insurnace in [place].  EDCM instructed patient to call DSS to check status of her Medicaid.  [facility] informed patient that if she wanted to change her pcp, she will need to contact DSS and will receive a new card.  Patient verbalized understanding.  No further EDCM needs at this time.

## 2014-10-27 NOTE — ED Provider Notes (Signed)
CSN: [id number]     Arrival date & time 10/27/14  1209 History   First MD Initiated Contact with Patient 10/27/14 1503     Chief Complaint  Patient presents with  . Flank Pain  . Abdominal Pain     (Consider location/radiation/quality/duration/timing/severity/associated sxs/prior Treatment) The history is provided by the patient and medical records. No language interpreter was used.     Amy Mendoza is a 34 y.o. female  with a hx of asthma, gallstones presents to the Emergency Department complaining of gradual, waxing and waning without resolving, progressively worsening LLQ abd pain onset Nov-Dec 2015.  Pt reports the pain is sharp and localized to the LLQ but does sometimes radiates to the left lower back where the pain is more aching.  Pt reports pain is rated at a 9/10.  Pt reports she has changed her diet, eliminating fried foods and taking fiber supplements along with miralax which has resolved her constipation.  Pt reports she has only been evaluated in the ED and the last dx was constipation.  Pt reports she has been taking Bentyl was well without relief.  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  Pt is sexually active with 1 female partner and denies vaginal complaints, discharge or hx of STD. Pt reports the sharp pain is worse with rest and better with movement.  Pt reports a hx of ovarian cyst.    LMP: 10/11/14.  Pt denies fever, chills, headache, neck pain, chest pain, SOB, N/V/D, weakness, dizziness, syncope, dysuria, hematuria, melena, hematochezia, vaginal discharge.     Past Medical History  Diagnosis Date  . Asthma   . Gallstones   . Hyperlipemia    Past Surgical History  Procedure Laterality Date  . Cholecystectomy  feb 2006  . Knee surgery      left   Family History  Problem Relation Age of Onset  . Diabetes Father     MGM, maternal uncle  . Prostate cancer Maternal Grandfather   . Breast cancer  Maternal Aunt   . Colon cancer Neg Hx    History  Substance Use Topics  . Smoking status: Never Smoker   . Smokeless tobacco: Never Used  . Alcohol Use: No     Comment: occasionally   OB History    No data available     Review of Systems  Constitutional: Negative for fever, diaphoresis, appetite change, fatigue and unexpected weight change.  HENT: Negative for mouth sores and trouble swallowing.   Respiratory: Negative for cough, chest tightness, shortness of breath, wheezing and stridor.   Cardiovascular: Negative for chest pain and palpitations.  Gastrointestinal: Positive for abdominal pain. Negative for nausea, vomiting, diarrhea, constipation, blood in stool, abdominal distention and rectal pain.  Genitourinary: Negative for dysuria, urgency, frequency, hematuria, flank pain and difficulty urinating.  Musculoskeletal: Positive for back pain (low). Negative for neck pain and neck stiffness.  Skin: Negative for rash.  Neurological: Negative for weakness.  Hematological: Negative for adenopathy.  Psychiatric/Behavioral: Negative for confusion.  All other systems reviewed and are negative.     Allergies  Review of patient's allergies indicates no known allergies.  Home Medications   Prior to Admission medications   Medication Sig Start Date End Date Taking? Authorizing Provider  FIBER SELECT GUMMIES PO Take 2 tablets by mouth daily.   Yes Historical Provider, MD  cephALEXin (KEFLEX) 500 MG capsule Take 1 capsule (500 mg  total) by mouth 4 (four) times daily. 09/04/14   Arthor Captain, PA-C  dicyclomine (BENTYL) 20 MG tablet Take 1 tablet (20 mg total) by mouth 2 (two) times daily as needed for spasms. 08/11/14   Loren Racer, MD  levofloxacin (LEVAQUIN) 750 MG tablet Take 1 tablet (750 mg total) by mouth daily. Patient not taking: Reported on 09/04/2014 04/08/14   Earley Favor, NP  polyethylene glycol (MIRALAX / GLYCOLAX) packet Take 17 g by mouth daily. 08/11/14   Loren Racer, MD  traMADol (ULTRAM) 50 MG tablet Take 1 tablet (50 mg total) by mouth every 6 (six) hours as needed. 09/04/14   Abigail Harris, PA-C   BP 115/56 mmHg  Pulse 73  Temp(Src) 98.9 F (37.2 C) (Oral)  Resp 18  SpO2 99%  LMP 10/10/2014 Physical Exam  Constitutional: She appears well-developed and well-nourished. No distress.  HENT:  Head: Normocephalic and atraumatic.  Mouth/Throat: Oropharynx is clear and moist. No oropharyngeal exudate.  Eyes: Conjunctivae are normal. No scleral icterus.  Neck: Normal range of motion. Neck supple.  Full ROM without pain  Cardiovascular: Normal rate, regular rhythm, normal heart sounds and intact distal pulses.   No murmur heard. Pulmonary/Chest: Effort normal and breath sounds normal. No respiratory distress. She has no wheezes.  Abdominal: Soft. Bowel sounds are normal. She exhibits no distension and no mass. There is tenderness (very mild LLQ abd tendernes on exam). There is no rebound and no guarding. Hernia confirmed negative in the right inguinal area and confirmed negative in the left inguinal area.  No CVA tenderness   Genitourinary: Uterus normal. No labial fusion. There is no rash, tenderness or lesion on the right labia. There is no rash, tenderness or lesion on the left labia. Uterus is not deviated, not enlarged, not fixed and not tender. Cervix exhibits no motion tenderness, no discharge and no friability. Right adnexum displays no mass, no tenderness and no fullness. Left adnexum displays no mass, no tenderness and no fullness. No erythema, tenderness or bleeding in the vagina. No foreign body around the vagina. No signs of injury around the vagina. Vaginal discharge (moderate amount, thick white, nonodorous) found.  Musculoskeletal: Normal range of motion. She exhibits no edema.  Full range of motion of the T-spine and L-spine No tenderness to palpation of the spinous processes of the T-spine or L-spine No tenderness to palpation of  the paraspinous muscles of the L-spine  Lymphadenopathy:    She has no cervical adenopathy.       Right: No inguinal adenopathy present.       Left: No inguinal adenopathy present.  Neurological: She is alert. She has normal reflexes.  Reflex Scores:      Bicep reflexes are 2+ on the right side and 2+ on the left side.      Brachioradialis reflexes are 2+ on the right side and 2+ on the left side.      Patellar reflexes are 2+ on the right side and 2+ on the left side.      Achilles reflexes are 2+ on the right side and 2+ on the left side. Speech is clear and goal oriented, follows commands Normal 5/5 strength in upper and lower extremities bilaterally including dorsiflexion and plantar flexion, strong and equal grip strength Sensation normal to light and sharp touch Moves extremities without ataxia, coordination intact Normal gait Normal balance No Clonus   Skin: Skin is warm and dry. No rash noted. She is not diaphoretic. No erythema.  Psychiatric:  She has a normal mood and affect. Her behavior is normal.  Nursing note and vitals reviewed.   ED Course  Procedures (including critical care time) Labs Review Labs Reviewed  WET PREP, GENITAL - Abnormal; Notable for the following:    Clue Cells Wet Prep HPF POC MANY (*)    WBC, Wet Prep HPF POC FEW (*)    All other components within normal limits  CBC WITH DIFFERENTIAL/PLATELET  COMPREHENSIVE METABOLIC PANEL  URINALYSIS, ROUTINE W REFLEX MICROSCOPIC  POC URINE PREG, ED  GC/CHLAMYDIA PROBE AMP ([place])    Imaging Review US Transvaginal Non-ob  10/27/2014   CLINICAL DATA:  LEFT lower quadrant and LEFT flank pain, question ovarian torsion  EXAM: TRANSABDOMINAL AND TRANSVAGINAL ULTRASOUND OF PELVIS  DOPPLER ULTRASOUND OF OVARIES  TECHNIQUE: Both transabdominal and transvaginal ultrasound examinations of the pelvis were performed. Transabdominal technique was performed for global imaging of the pelvis including uterus, ovaries,  adnexal regions, and pelvic cul-de-sac.  It was necessary to proceed with endovaginal exam following the transabdominal exam to visualize blood flow in the ovaries. Color and duplex Doppler ultrasound was utilized to evaluate blood flow to the ovaries.  COMPARISON:  CT abdomen and pelvis 02/21/2010  FINDINGS: Uterus  Measurements: 8.3 x 4.1 x 5.1 cm. Normal morphology without mass.  Endometrium  Thickness: 12 mm thick, normal. No endometrial fluid or focal abnormality.  Right ovary  Measurements: 3.2 x 1.6 x 1.9 cm. Normal morphology without mass. Blood flow present within RIGHT ovary on color Doppler imaging.  Left ovary  Measurements: 3.8 x 2.3 x 2.7 cm. Normal morphology without mass. Blood flow present within LEFT ovary on color Doppler imaging.  Pulsed Doppler evaluation of both ovaries demonstrates normal low-resistance arterial and venous waveforms.  Other findings  Trace free pelvic fluid  IMPRESSION: Normal exam.   Electronically Signed   By: Ulyses Southward M.D.   On: 10/27/2014 16:44   US Pelvis Complete  10/27/2014   CLINICAL DATA:  LEFT lower quadrant and LEFT flank pain, question ovarian torsion  EXAM: TRANSABDOMINAL AND TRANSVAGINAL ULTRASOUND OF PELVIS  DOPPLER ULTRASOUND OF OVARIES  TECHNIQUE: Both transabdominal and transvaginal ultrasound examinations of the pelvis were performed. Transabdominal technique was performed for global imaging of the pelvis including uterus, ovaries, adnexal regions, and pelvic cul-de-sac.  It was necessary to proceed with endovaginal exam following the transabdominal exam to visualize blood flow in the ovaries. Color and duplex Doppler ultrasound was utilized to evaluate blood flow to the ovaries.  COMPARISON:  CT abdomen and pelvis 02/21/2010  FINDINGS: Uterus  Measurements: 8.3 x 4.1 x 5.1 cm. Normal morphology without mass.  Endometrium  Thickness: 12 mm thick, normal. No endometrial fluid or focal abnormality.  Right ovary  Measurements: 3.2 x 1.6 x 1.9 cm. Normal  morphology without mass. Blood flow present within RIGHT ovary on color Doppler imaging.  Left ovary  Measurements: 3.8 x 2.3 x 2.7 cm. Normal morphology without mass. Blood flow present within LEFT ovary on color Doppler imaging.  Pulsed Doppler evaluation of both ovaries demonstrates normal low-resistance arterial and venous waveforms.  Other findings  Trace free pelvic fluid  IMPRESSION: Normal exam.   Electronically Signed   By: Ulyses Southward M.D.   On: 10/27/2014 16:44   Ct Abdomen Pelvis W Contrast  10/27/2014   CLINICAL DATA:  Left flank pain starting last November, question constipation  EXAM: CT ABDOMEN AND PELVIS WITH CONTRAST  TECHNIQUE: Multidetector CT imaging of the abdomen and pelvis was  performed using the standard protocol following bolus administration of intravenous contrast.  CONTRAST:  OMNIPAQUE IOHEXOL 300 MG/ML SOLN, 25mL OMNIPAQUE IOHEXOL 300 MG/ML SOLN  COMPARISON:  None.  FINDINGS: The lung bases are unremarkable. Sagittal images of the spine shows mild degenerative changes lower thoracic spine. There is right paraumbilical hernia containing omental fat without evidence of acute complication measures 3 by 2.9 cm.  Enhanced liver is unremarkable. No intrahepatic biliary ductal dilatation. The patient is status postcholecystectomy. The pancreas, spleen and adrenal glands are unremarkable. Enhanced kidneys are symmetrical in size. No hydronephrosis or hydroureter. There is nonobstructive calcified calculus in midpole of the right kidney measures 2 mm.  No aortic aneurysm.  No small bowel obstruction. Moderate stool are noted in right colon and cecum. The tip of the cecum is in mid abdomen just inferior to level of umbilicus. Normal retrocecal appendix axial image 62.  Moderate stool noted in descending colon, transverse colon and sigmoid colon. There are colonic diverticula in proximal sigmoid colon in left lower quadrant. There is no evidence of acute diverticulitis. Enhanced uterus is  unremarkable. Two small follicles are noted within left ovary there largest measures 1.5 cm. No pelvic ascites or adenopathy. No inguinal adenopathy.  Moderate stool noted in transverse colon sigmoid colon and descending colon. Colonic diverticula are noted proximal sigmoid colon left lower quadrant. There is no evidence of acute colitis or diverticulitis.  IMPRESSION: 1. No pericecal inflammation.  Normal appendix. 2. Moderate stool noted throughout the colon. 3. No evidence of colitis or diverticulitis. Scattered colonic diverticula are noted proximal sigmoid colon left lower quadrant. 4. There is a right paraumbilical ventral hernia containing fat measures 3 x 2.9 cm without evidence of acute complication. 5. No small bowel obstruction. 6. Right nonobstructive nephrolithiasis. No hydronephrosis or hydroureter.   Electronically Signed   By: Natasha Mead M.D.   On: 10/27/2014 18:29   [place] Art/ven Flow Abd Pelv Doppler  10/27/2014   CLINICAL DATA:  LEFT lower quadrant and LEFT flank pain, question ovarian torsion  EXAM: TRANSABDOMINAL AND TRANSVAGINAL ULTRASOUND OF PELVIS  DOPPLER ULTRASOUND OF OVARIES  TECHNIQUE: Both transabdominal and transvaginal ultrasound examinations of the pelvis were performed. Transabdominal technique was performed for global imaging of the pelvis including uterus, ovaries, adnexal regions, and pelvic cul-de-sac.  It was necessary to proceed with endovaginal exam following the transabdominal exam to visualize blood flow in the ovaries. Color and duplex Doppler ultrasound was utilized to evaluate blood flow to the ovaries.  COMPARISON:  CT abdomen and pelvis 02/21/2010  FINDINGS: Uterus  Measurements: 8.3 x 4.1 x 5.1 cm. Normal morphology without mass.  Endometrium  Thickness: 12 mm thick, normal. No endometrial fluid or focal abnormality.  Right ovary  Measurements: 3.2 x 1.6 x 1.9 cm. Normal morphology without mass. Blood flow present within RIGHT ovary on color Doppler imaging.  Left  ovary  Measurements: 3.8 x 2.3 x 2.7 cm. Normal morphology without mass. Blood flow present within LEFT ovary on color Doppler imaging.  Pulsed Doppler evaluation of both ovaries demonstrates normal low-resistance arterial and venous waveforms.  Other findings  Trace free pelvic fluid  IMPRESSION: Normal exam.   Electronically Signed   By: Ulyses Southward M.D.   On: 10/27/2014 16:44     EKG Interpretation None      MDM   Final diagnoses:  LLQ abdominal pain    Amy Ogren presents with months of LLQ abd pain.  Previous w/u included FU KUB but no further  imaging. Patient reports compliance with her constipation medications and resolution of her constipation with persistence in her pain.  Patient reports a normal colonoscopy in 2012.  4:45PM Ultrasound without ovarian torsion or ovarian cyst. Pelvic exam without cervical motion tenderness, adnexal mass or lateralizing adnexal tenderness. Will obtain CT scan of abdomen and pelvis.  Labs reassuring  7:19 PM CT scan without acute abnormality. Right paraumbilical ventral hernia containing fat is noted. This is not palpable on abdominal exam. Finding discussed with patient and will monitor. Patient also with moderate stool burden throughout the colon. Discussed continued bowel regimen and GI follow-up.   Patient is nontoxic, nonseptic appearing, in no apparent distress.  Patient's pain and other symptoms adequately managed in emergency department.  Fluid bolus given.  Labs, imaging and vitals reviewed.  Patient does not meet the SIRS or Sepsis criteria.  On repeat exam patient does not have a surgical abdomin and there are no peritoneal signs.  No indication of appendicitis, bowel obstruction, bowel perforation, cholecystitis, diverticulitis, PID or ectopic pregnancy.  I have also discussed reasons to return immediately to the ER.  Patient expresses understanding and agrees with plan.  BP 115/56 mmHg  Pulse 73  Temp(Src) 98.9 F (37.2 C) (Oral)   Resp 18  SpO2 99%  LMP 10/10/2014      Amy Forth, PA-C 10/27/14 1932  Amy Mcgahee, PA-C 10/27/14 1936  Benjiman Core, MD 10/28/14 [phone]

## 2014-10-27 NOTE — ED Notes (Signed)
Pt transported to CT ?

## 2014-10-27 NOTE — ED Notes (Signed)
Unsuccessful IV by this nurse; Abby at  Bedside attempting IV start at present time.

## 2014-10-28 LAB — GC/CHLAMYDIA PROBE AMP (~~LOC~~) NOT AT ARMC
Chlamydia: NEGATIVE
Neisseria Gonorrhea: NEGATIVE

## 2015-01-27 ENCOUNTER — Encounter (HOSPITAL_COMMUNITY): Payer: Self-pay | Admitting: Emergency Medicine

## 2015-01-27 DIAGNOSIS — S3992XA Unspecified injury of lower back, initial encounter: Secondary | ICD-10-CM | POA: Diagnosis present

## 2015-01-27 DIAGNOSIS — J45909 Unspecified asthma, uncomplicated: Secondary | ICD-10-CM | POA: Diagnosis not present

## 2015-01-27 DIAGNOSIS — Z8719 Personal history of other diseases of the digestive system: Secondary | ICD-10-CM | POA: Insufficient documentation

## 2015-01-27 DIAGNOSIS — E669 Obesity, unspecified: Secondary | ICD-10-CM | POA: Insufficient documentation

## 2015-01-27 DIAGNOSIS — Z9049 Acquired absence of other specified parts of digestive tract: Secondary | ICD-10-CM | POA: Insufficient documentation

## 2015-01-27 DIAGNOSIS — X58XXXA Exposure to other specified factors, initial encounter: Secondary | ICD-10-CM | POA: Insufficient documentation

## 2015-01-27 DIAGNOSIS — Z8639 Personal history of other endocrine, nutritional and metabolic disease: Secondary | ICD-10-CM | POA: Diagnosis not present

## 2015-01-27 DIAGNOSIS — S39012A Strain of muscle, fascia and tendon of lower back, initial encounter: Secondary | ICD-10-CM | POA: Diagnosis not present

## 2015-01-27 DIAGNOSIS — Y9389 Activity, other specified: Secondary | ICD-10-CM | POA: Diagnosis not present

## 2015-01-27 DIAGNOSIS — Y998 Other external cause status: Secondary | ICD-10-CM | POA: Diagnosis not present

## 2015-01-27 DIAGNOSIS — Y9289 Other specified places as the place of occurrence of the external cause: Secondary | ICD-10-CM | POA: Insufficient documentation

## 2015-01-27 DIAGNOSIS — R011 Cardiac murmur, unspecified: Secondary | ICD-10-CM | POA: Diagnosis not present

## 2015-01-27 DIAGNOSIS — S3991XA Unspecified injury of abdomen, initial encounter: Secondary | ICD-10-CM | POA: Diagnosis not present

## 2015-01-27 LAB — CBC
HEMATOCRIT: 36.3 % (ref 36.0–46.0)
HEMOGLOBIN: 11.9 g/dL — AB (ref 12.0–15.0)
MCH: 27.4 pg (ref 26.0–34.0)
MCHC: 32.8 g/dL (ref 30.0–36.0)
MCV: 83.4 fL (ref 78.0–100.0)
PLATELETS: 230 10*3/uL (ref 150–400)
RBC: 4.35 MIL/uL (ref 3.87–5.11)
RDW: 12.7 % (ref 11.5–15.5)
WBC: 7.8 10*3/uL (ref 4.0–10.5)

## 2015-01-27 LAB — COMPREHENSIVE METABOLIC PANEL
ALK PHOS: 40 U/L (ref 38–126)
ALT: 16 U/L (ref 14–54)
ANION GAP: 5 (ref 5–15)
AST: 17 U/L (ref 15–41)
Albumin: 3.4 g/dL — ABNORMAL LOW (ref 3.5–5.0)
BILIRUBIN TOTAL: 0.5 mg/dL (ref 0.3–1.2)
BUN: 7 mg/dL (ref 6–20)
CALCIUM: 8.7 mg/dL — AB (ref 8.9–10.3)
CO2: 26 mmol/L (ref 22–32)
Chloride: 106 mmol/L (ref 101–111)
Creatinine, Ser: 0.94 mg/dL (ref 0.44–1.00)
GFR calc Af Amer: >60 mL/min (ref >60)
GLUCOSE: 93 mg/dL (ref 65–99)
Potassium: 3.6 mmol/L (ref 3.5–5.1)
Sodium: 137 mmol/L (ref 135–145)
Total Protein: 7.3 g/dL (ref 6.5–8.1)

## 2015-01-27 LAB — URINE MICROSCOPIC-ADD ON

## 2015-01-27 LAB — URINALYSIS, ROUTINE W REFLEX MICROSCOPIC
Bilirubin Urine: NEGATIVE
Glucose, UA: NEGATIVE mg/dL
HGB URINE DIPSTICK: NEGATIVE
Ketones, ur: NEGATIVE mg/dL
Nitrite: NEGATIVE
PROTEIN: NEGATIVE mg/dL
Specific Gravity, Urine: 1.026 (ref 1.005–1.030)
UROBILINOGEN UA: 1 mg/dL (ref 0.0–1.0)
pH: 6 (ref 5.0–8.0)

## 2015-01-27 LAB — I-STAT BETA HCG BLOOD, ED (MC, WL, AP ONLY)

## 2015-01-27 LAB — LIPASE, BLOOD: LIPASE: 22 U/L (ref 22–51)

## 2015-01-27 NOTE — ED Notes (Signed)
C/o intermittent LLQ pain since November.  Pain worse x 1 week with radiation to back.  Denies nausea, vomiting, diarrhea, or urinary complaints.

## 2015-01-28 ENCOUNTER — Emergency Department (HOSPITAL_COMMUNITY)
Admission: EM | Admit: 2015-01-28 | Discharge: 2015-01-28 | Disposition: A | Discharge status: Home / Self Care | Payer: Medicaid Other | Attending: Emergency Medicine | Admitting: Emergency Medicine

## 2015-01-28 ENCOUNTER — Emergency Department (HOSPITAL_COMMUNITY): Payer: Medicaid Other

## 2015-01-28 DIAGNOSIS — S39012A Strain of muscle, fascia and tendon of lower back, initial encounter: Secondary | ICD-10-CM

## 2015-01-28 MED ORDER — DIAZEPAM 5 MG PO TABS
5.0000 mg | ORAL_TABLET | Freq: Once | ORAL | Status: AC
  Administered 2015-01-28: 5 mg via ORAL
  Filled 2015-01-28: qty 1

## 2015-01-28 MED ORDER — IBUPROFEN 600 MG PO TABS: 600.0000 mg | ORAL_TABLET | Freq: Four times a day (QID) | ORAL | Status: DC | PRN

## 2015-01-28 MED ORDER — OXYCODONE-ACETAMINOPHEN 5-325 MG PO TABS: 1.0000 | ORAL_TABLET | Freq: Four times a day (QID) | ORAL | Status: DC | PRN

## 2015-01-28 MED ORDER — MORPHINE SULFATE 4 MG/ML IJ SOLN
4.0000 mg | Freq: Once | INTRAMUSCULAR | Status: AC
  Administered 2015-01-28: 4 mg via INTRAVENOUS
  Filled 2015-01-28: qty 1

## 2015-01-28 MED ORDER — ONDANSETRON HCL 4 MG/2ML IJ SOLN
4.0000 mg | Freq: Once | INTRAMUSCULAR | Status: AC
  Administered 2015-01-28: 4 mg via INTRAVENOUS
  Filled 2015-01-28: qty 2

## 2015-01-28 MED ORDER — CYCLOBENZAPRINE HCL 10 MG PO TABS: 10.0000 mg | ORAL_TABLET | Freq: Two times a day (BID) | ORAL | Status: DC | PRN

## 2015-01-28 NOTE — Discharge Instructions (Signed)
Were seen today with pain over her left back and flank. Because her pain is worse with movement and you describe muscle spasms, feel this pain is likely muscle related.  Lumbosacral Strain Lumbosacral strain is a strain of any of the parts that make up your lumbosacral vertebrae. Your lumbosacral vertebrae are the bones that make up the lower third of your backbone. Your lumbosacral vertebrae are held together by muscles and tough, fibrous tissue (ligaments).  CAUSES  A sudden blow to your back can cause lumbosacral strain. Also, anything that causes an excessive stretch of the muscles in the low back can cause this strain. This is typically seen when people exert themselves strenuously, fall, lift heavy objects, bend, or crouch repeatedly. RISK FACTORS  Physically demanding work.  Participation in pushing or pulling sports or sports that require a sudden twist of the back (tennis, golf, baseball).  Weight lifting.  Excessive lower back curvature.  Forward-tilted pelvis.  Weak back or abdominal muscles or both.  Tight hamstrings. SIGNS AND SYMPTOMS  Lumbosacral strain may cause pain in the area of your injury or pain that moves (radiates) down your leg.  DIAGNOSIS Your health care provider can often diagnose lumbosacral strain through a physical exam. In some cases, you may need tests such as X-ray exams.  TREATMENT  Treatment for your lower back injury depends on many factors that your clinician will have to evaluate. However, most treatment will include the use of anti-inflammatory medicines. HOME CARE INSTRUCTIONS   Avoid hard physical activities (tennis, racquetball, waterskiing) if you are not in proper physical condition for it. This may aggravate or create problems.  If you have a back problem, avoid sports requiring sudden body movements. Swimming and walking are generally safer activities.  Maintain good posture.  Maintain a healthy weight.  For acute conditions, you may  put ice on the injured area.  Put ice in a plastic bag.  Place a towel between your skin and the bag.  Leave the ice on for 20 minutes, 2-3 times a day.  When the low back starts healing, stretching and strengthening exercises may be recommended. SEEK MEDICAL CARE IF:  Your back pain is getting worse.  You experience severe back pain not relieved with medicines. SEEK IMMEDIATE MEDICAL CARE IF:   You have numbness, tingling, weakness, or problems with the use of your arms or legs.  There is a change in bowel or bladder control.  You have increasing pain in any area of the body, including your belly (abdomen).  You notice shortness of breath, dizziness, or feel faint.  You feel sick to your stomach (nauseous), are throwing up (vomiting), or become sweaty.  You notice discoloration of your toes or legs, or your feet get very cold. MAKE SURE YOU:   Understand these instructions.  Will watch your condition.  Will get help right away if you are not doing well or get worse. Document Released: 04/12/2005 Document Revised: 07/08/2013 Document Reviewed: 02/19/2013 [facility] Patient Information 2015 [employer], [place]. This information is not intended to replace advice given to you by your health care provider. Make sure you discuss any questions you have with your health care provider.

## 2015-01-28 NOTE — ED Provider Notes (Signed)
CSN: [id number]     Arrival date & time 01/27/15  2158 History   This chart was scribed for  Shon Batonourtney F Norval Slaven, MD by Bethel BornBritney McCollum, ED Scribe. This patient was seen in room D34C/D34C and the patient's care was started at 1:42 AM.    Chief Complaint  Patient presents with  . Abdominal Pain    The history is provided by the patient. No language interpreter was used.   Amy Mendoza is a 34 y.o. female who presents to the Emergency Department complaining of intermittent left back and LLQ with onset in November 2015. The pain is worse this week and radiates to the left lower back. She describes the pain as sharp and rates it 11/10 in severity. The pain is exacerbated by movement. Ibuprofen, Tylenol, and oxycodone that she got from her cousin provided insufficient pain relief PTA. 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 Pt denies n/v/d, dysuria, hematuria, vaginal discharge, and vaginal bleeding. Last normal bowel movement was today. LNMP was 1 month ago. No daily medication. Family history of ectopic pregnancy.   Past Medical History  Diagnosis Date  . Asthma   . Gallstones   . Hyperlipemia    Past Surgical History  Procedure Laterality Date  . Cholecystectomy  feb 2006  . Knee surgery      left   Family History  Problem Relation Age of Onset  . Diabetes Father     MGM, maternal uncle  . Prostate cancer Maternal Grandfather   . Breast cancer Maternal Aunt   . Colon cancer Neg Hx    History  Substance Use Topics  . Smoking status: Never Smoker   . Smokeless tobacco: Never Used  . Alcohol Use: No     Comment: occasionally   OB History    No data available     Review of Systems  Constitutional: Negative for fever.  Respiratory: Negative for chest tightness and shortness of breath.   Cardiovascular: Negative for chest pain.  Gastrointestinal: Positive for abdominal pain. Negative for nausea and vomiting.   Genitourinary: Positive for flank pain. Negative for dysuria and hematuria.  Musculoskeletal: Positive for back pain.  Neurological: Negative for headaches.  All other systems reviewed and are negative.     Allergies  Review of patient's allergies indicates no known allergies.  Home Medications   Prior to Admission medications   Medication Sig Start Date End Date Taking? Authorizing Provider  CAMRESE 0.15-0.03 &0.01 MG tablet Take 1 tablet by mouth daily. 12/23/14  Yes Historical Provider, MD  cyclobenzaprine (FLEXERIL) 10 MG tablet Take 1 tablet (10 mg total) by mouth 2 (two) times daily as needed for muscle spasms. 01/28/15   Shon Batonourtney F Akosua Constantine, MD  ibuprofen (ADVIL,MOTRIN) 600 MG tablet Take 1 tablet (600 mg total) by mouth every 6 (six) hours as needed. 01/28/15   Shon Batonourtney F Bathsheba Durrett, MD  oxyCODONE-acetaminophen (PERCOCET/ROXICET) 5-325 MG per tablet Take 1 tablet by mouth every 6 (six) hours as needed for severe pain. 01/28/15   Shon Batonourtney F Maurion Walkowiak, MD   BP 122/72 mmHg  Pulse 88  Temp(Src) 98 F (36.7 C) (Oral)  Resp 20  SpO2 97%  LMP 12/30/2014 Physical Exam  Constitutional: She is oriented to person, place, and time. She appears well-developed and well-nourished.  Obese  HENT:  Head: Normocephalic and atraumatic.  Cardiovascular: Normal rate and regular rhythm.   Murmur heard. Pulmonary/Chest: Effort normal and breath sounds normal. No respiratory  distress. She has no wheezes.  Abdominal: Soft. Bowel sounds are normal. She exhibits no distension. There is no tenderness. There is no rebound and no guarding.  Musculoskeletal:  Lower paraspinous muscle region, left SI joint, no step off or deformities  Neurological: She is alert and oriented to person, place, and time.  5 out of 5 strength bilateral lower extremities  Skin: Skin is warm and dry.  Psychiatric: She has a normal mood and affect.  Nursing note and vitals reviewed.   ED Course  Procedures   DIAGNOSTIC  STUDIES: Oxygen Saturation is 97% on RA, normal by my interpretation.    COORDINATION OF CARE: 1:48 AM Discussed treatment plan which includes lab work with pt at bedside and pt agreed to plan.  Labs Review Labs Reviewed  COMPREHENSIVE METABOLIC PANEL - Abnormal; Notable for the following:    Calcium 8.7 (*)    Albumin 3.4 (*)    All other components within normal limits  CBC - Abnormal; Notable for the following:    Hemoglobin 11.9 (*)    All other components within normal limits  URINALYSIS, ROUTINE W REFLEX MICROSCOPIC (NOT AT [facility]) - Abnormal; Notable for the following:    APPearance CLOUDY (*)    Leukocytes, UA TRACE (*)    All other components within normal limits  URINE MICROSCOPIC-ADD ON - Abnormal; Notable for the following:    Squamous Epithelial / LPF FEW (*)    All other components within normal limits  LIPASE, BLOOD  I-STAT BETA HCG BLOOD, ED ([facility], [facility], [facility] ONLY)    Imaging Review Dg Lumbar Spine Complete  01/28/2015   CLINICAL DATA:  Right lower quadrant abdominal pain radiating to the lower back and hip for months.  EXAM: LUMBAR SPINE - COMPLETE 4+ VIEW  COMPARISON:  CT abdomen and pelvis 10/27/2014  FINDINGS: Mild degenerative changes at T11-12 and T12-L1. Normal alignment of the lumbar spine. No vertebral compression deformities. No focal bone lesion or bone destruction. Bone cortex and trabecular architecture appear intact.  IMPRESSION: Mild degenerative changes. Normal alignment. No acute displaced fractures.   Electronically Signed   By: Burman Nieves M.D.   On: 01/28/2015 02:42     EKG Interpretation None      MDM   Final diagnoses:  Lumbar strain, initial encounter   Patient presents with ongoing waxing and waning back, left flank, left abdomen pain. Ongoing since November. Acute exacerbation of pain. Patient does do heavy lifting. On physical exam her abdominal exam is benign. She has no other associated abdominal symptoms. She does have tenderness over  the musculature of her back as well as tenderness over her SI joint. Suspect strain. Plain films of the lumbar spine obtained. Patient given pain medication and muscle relaxant. Basic labwork obtained and reassuring. On recheck, patient does report some improvement of pain. Discussed with her the workup. At this time and do not feel she needs further workup given reassuring lab testing and physical exam findings that are benign. Will discharge her with a short course of pain medication and muscle relaxers. She needs to add an anti-inflammatory daily. Patient to follow-up with her primary physician if symptoms do not improve.  After history, exam, and medical workup I feel the patient has been appropriately medically screened and is safe for discharge home. Pertinent diagnoses were discussed with the patient. Patient was given return precautions.  I personally performed the services described in this documentation, which was scribed in my presence. The recorded information has been  reviewed and is accurate.      Shon Baton, MD 01/28/15 [phone]

## 2015-12-05 ENCOUNTER — Encounter (HOSPITAL_COMMUNITY): Payer: Self-pay

## 2015-12-05 ENCOUNTER — Emergency Department (HOSPITAL_COMMUNITY): Payer: BLUE CROSS/BLUE SHIELD

## 2015-12-05 ENCOUNTER — Emergency Department (HOSPITAL_COMMUNITY)
Admission: EM | Admit: 2015-12-05 | Discharge: 2015-12-05 | Disposition: A | Discharge status: Home / Self Care | Payer: BLUE CROSS/BLUE SHIELD | Attending: Emergency Medicine | Admitting: Emergency Medicine

## 2015-12-05 DIAGNOSIS — E785 Hyperlipidemia, unspecified: Secondary | ICD-10-CM | POA: Insufficient documentation

## 2015-12-05 DIAGNOSIS — Z791 Long term (current) use of non-steroidal anti-inflammatories (NSAID): Secondary | ICD-10-CM | POA: Insufficient documentation

## 2015-12-05 DIAGNOSIS — K5900 Constipation, unspecified: Secondary | ICD-10-CM | POA: Insufficient documentation

## 2015-12-05 DIAGNOSIS — J45909 Unspecified asthma, uncomplicated: Secondary | ICD-10-CM | POA: Insufficient documentation

## 2015-12-05 DIAGNOSIS — Z79899 Other long term (current) drug therapy: Secondary | ICD-10-CM | POA: Diagnosis not present

## 2015-12-05 DIAGNOSIS — R319 Hematuria, unspecified: Secondary | ICD-10-CM

## 2015-12-05 DIAGNOSIS — Z79891 Long term (current) use of opiate analgesic: Secondary | ICD-10-CM | POA: Insufficient documentation

## 2015-12-05 DIAGNOSIS — R109 Unspecified abdominal pain: Secondary | ICD-10-CM | POA: Diagnosis present

## 2015-12-05 LAB — URINALYSIS, ROUTINE W REFLEX MICROSCOPIC
Bilirubin Urine: NEGATIVE
Glucose, UA: NEGATIVE mg/dL
Ketones, ur: NEGATIVE mg/dL
LEUKOCYTES UA: NEGATIVE
Nitrite: NEGATIVE
Protein, ur: NEGATIVE mg/dL
Specific Gravity, Urine: 1.012 (ref 1.005–1.030)
pH: 8 (ref 5.0–8.0)

## 2015-12-05 LAB — I-STAT CHEM 8, ED
BUN: 10 mg/dL (ref 6–20)
CALCIUM ION: 1.22 mmol/L (ref 1.12–1.23)
Chloride: 102 mmol/L (ref 101–111)
Creatinine, Ser: 0.8 mg/dL (ref 0.44–1.00)
Glucose, Bld: 84 mg/dL (ref 65–99)
HCT: 46 % (ref 36.0–46.0)
Hemoglobin: 15.6 g/dL — ABNORMAL HIGH (ref 12.0–15.0)
Potassium: 3.9 mmol/L (ref 3.5–5.1)
SODIUM: 142 mmol/L (ref 135–145)
TCO2: 28 mmol/L (ref 0–100)

## 2015-12-05 LAB — CBC WITH DIFFERENTIAL/PLATELET
Basophils Absolute: 0 10*3/uL (ref 0.0–0.1)
Basophils Relative: 0 %
EOS ABS: 0 10*3/uL (ref 0.0–0.7)
EOS PCT: 1 %
HCT: 42.2 % (ref 36.0–46.0)
HEMOGLOBIN: 13.9 g/dL (ref 12.0–15.0)
Lymphocytes Relative: 22 %
Lymphs Abs: 1.4 10*3/uL (ref 0.7–4.0)
MCH: 27.9 pg (ref 26.0–34.0)
MCHC: 32.9 g/dL (ref 30.0–36.0)
MCV: 84.7 fL (ref 78.0–100.0)
MONOS PCT: 4 %
Monocytes Absolute: 0.3 10*3/uL (ref 0.1–1.0)
NEUTROS PCT: 73 %
Neutro Abs: 4.7 10*3/uL (ref 1.7–7.7)
PLATELETS: 277 10*3/uL (ref 150–400)
RBC: 4.98 MIL/uL (ref 3.87–5.11)
RDW: 12.8 % (ref 11.5–15.5)
WBC: 6.4 10*3/uL (ref 4.0–10.5)

## 2015-12-05 LAB — URINE MICROSCOPIC-ADD ON
Bacteria, UA: NONE SEEN
WBC UA: NONE SEEN WBC/hpf (ref 0–5)

## 2015-12-05 LAB — POC URINE PREG, ED
PREG TEST UR: NEGATIVE
Preg Test, Ur: NEGATIVE

## 2015-12-05 MED ORDER — POLYETHYLENE GLYCOL 3350 17 G PO PACK: 17.0000 g | PACK | Freq: Every day | ORAL | Status: DC

## 2015-12-05 NOTE — ED Notes (Signed)
Bed: WA01 Expected date:  Expected time:  Means of arrival:  Comments: EMS- 34yo R flank pain; fentanyl given

## 2015-12-05 NOTE — ED Provider Notes (Signed)
CSN: [id number]     Arrival date & time 12/05/15  [id number] History   First MD Initiated Contact with Patient 12/05/15 [phone]     Chief Complaint  Patient presents with  . Flank Pain      Patient is a 35 y.o. female presenting with flank pain. The history is provided by the patient.  Flank Pain Pertinent negatives include no chest pain, no abdominal pain, no headaches and no shortness of breath.  Patient presents with right flank pain. Began this morning. Has had urinary frequency he states that she had to go and only had a little month ago. Pain was severe. Unable to find a comfortable position. Had slight chills with the episode. No trauma. No diarrhea or constipation. Pain feels better now after EMS gave fentanyl.  Past Medical History  Diagnosis Date  . Asthma   . Gallstones   . Hyperlipemia    Past Surgical History  Procedure Laterality Date  . Cholecystectomy  feb 2006  . Knee surgery      left   Family History  Problem Relation Age of Onset  . Diabetes Father     MGM, maternal uncle  . Prostate cancer Maternal Grandfather   . Breast cancer Maternal Aunt   . Colon cancer Neg Hx    Social History  Substance Use Topics  . Smoking status: Never Smoker   . Smokeless tobacco: Never Used  . Alcohol Use: No     Comment: occasionally   OB History    No data available     Review of Systems  Constitutional: Positive for chills. Negative for activity change and appetite change.  Eyes: Negative for pain.  Respiratory: Negative for chest tightness and shortness of breath.   Cardiovascular: Negative for chest pain and leg swelling.  Gastrointestinal: Negative for nausea, vomiting, abdominal pain and diarrhea.  Genitourinary: Positive for flank pain.  Musculoskeletal: Negative for back pain and neck stiffness.  Skin: Negative for rash.  Neurological: Negative for weakness, numbness and headaches.  Psychiatric/Behavioral: Negative for behavioral problems.      Allergies   Review of patient's allergies indicates no known allergies.  Home Medications   Prior to Admission medications   Medication Sig Start Date End Date Taking? Authorizing Provider  phentermine 37.5 MG capsule Take 37.5 mg by mouth every morning.   Yes Historical Provider, MD  cyclobenzaprine (FLEXERIL) 10 MG tablet Take 1 tablet (10 mg total) by mouth 2 (two) times daily as needed for muscle spasms. Patient not taking: Reported on 12/05/2015 01/28/15   Shon Batonourtney F Horton, MD  ibuprofen (ADVIL,MOTRIN) 600 MG tablet Take 1 tablet (600 mg total) by mouth every 6 (six) hours as needed. Patient not taking: Reported on 12/05/2015 01/28/15   Shon Batonourtney F Horton, MD  oxyCODONE-acetaminophen (PERCOCET/ROXICET) 5-325 MG per tablet Take 1 tablet by mouth every 6 (six) hours as needed for severe pain. Patient not taking: Reported on 12/05/2015 01/28/15   Shon Batonourtney F Horton, MD  polyethylene glycol [facility](MIRALAX) packet Take 17 g by mouth daily. 12/05/15   Benjiman CoreNathan Daivik Overley, MD   BP 116/60 mmHg  Pulse 78  Temp(Src) 98 F (36.7 C) (Oral)  Resp 18  SpO2 100%  LMP 11/28/2015 (Exact Date) Physical Exam  Constitutional: She appears well-developed.  HENT:  Head: Atraumatic.  Eyes: EOM are normal.  Neck: Neck supple.  Cardiovascular: Normal rate.   Pulmonary/Chest: Effort normal.  Abdominal: Soft.  Musculoskeletal: Normal range of motion. She exhibits no edema.  Neurological: She is alert.  Skin: Skin is warm.    ED Course  Procedures (including critical care time) Labs Review Labs Reviewed  URINALYSIS, ROUTINE W REFLEX MICROSCOPIC (NOT AT [facility]) - Abnormal; Notable for the following:    Hgb urine dipstick SMALL (*)    All other components within normal limits  URINE MICROSCOPIC-ADD ON - Abnormal; Notable for the following:    Squamous Epithelial / LPF 0-5 (*)    All other components within normal limits  I-STAT CHEM 8, ED - Abnormal; Notable for the following:    Hemoglobin 15.6 (*)    All other components  within normal limits  CBC WITH DIFFERENTIAL/PLATELET  POC URINE PREG, ED  POC URINE PREG, ED    Imaging Review Ct Renal Stone Study  12/05/2015  CLINICAL DATA:  Right flank pain.  Remote cholecystectomy. EXAM: CT ABDOMEN AND PELVIS WITHOUT CONTRAST TECHNIQUE: Multidetector CT imaging of the abdomen and pelvis was performed following the standard protocol without IV contrast. COMPARISON:  10/27/2014 CT abdomen/pelvis. FINDINGS: Lower chest: No significant pulmonary nodules or acute consolidative airspace disease. Hepatobiliary: Normal liver with no liver mass. Cholecystectomy. No biliary ductal dilatation. Pancreas: Normal, with no mass or duct dilation. Spleen: Normal size. No mass. Adrenals/Urinary Tract: Normal adrenals. No right renal stones. Nonobstructing 2 mm left lower renal stone. No hydronephrosis. Normal caliber ureters, with no ureteral stones. No contour deforming renal masses. Normal bladder. Stomach/Bowel: Grossly normal stomach. Normal caliber small bowel with no small bowel wall thickening. Normal appendix. Mild sigmoid diverticulosis, with no large bowel wall thickening or pericolonic fat stranding. Mild-to-moderate stool throughout the colon. Vascular/Lymphatic: Normal caliber abdominal aorta. No pathologically enlarged lymph nodes in the abdomen or pelvis. Reproductive: Grossly normal uterus.  No adnexal mass. Other: No pneumoperitoneum, ascites or focal fluid collection. Stable small to moderate fat containing umbilical hernia. Musculoskeletal: No aggressive appearing focal osseous lesions. Minimal degenerative changes in the visualized thoracolumbar spine. IMPRESSION: 1. No evidence of urinary tract obstruction. No right urinary tract stones. Punctate nonobstructing left renal stone. 2. Mild sigmoid diverticulosis, with no evidence of acute diverticulitis. No evidence of bowel obstruction or acute bowel inflammation. Normal appendix. Mild-to-moderate colonic stool volume. 3. Stable small  fat containing umbilical hernia. Electronically Signed   By: Delbert Phenix M.D.   On: 12/05/2015 11:11   I have personally reviewed and evaluated these images and lab results as part of my medical decision-making.   EKG Interpretation None      MDM   Final diagnoses:  Flank pain  Hematuria  Constipation, unspecified constipation type    Patient with flank pain. Has some hematuria. Had had urinary symptoms. No clear urinary tract infection or stone. Did have some constipation which will be treated. Will discharge home. Have follow-up with urology for the hematuria.    Benjiman Core, MD 12/05/15 1201

## 2015-12-05 NOTE — ED Notes (Signed)
She tells me that she experienced right flank pain radiating toward rlq area while at work this morning.  Earlier this morning she recalls having some urinary hesitancy, but denies fever and dysuria.  She received of IV fentanyl en route to hospital and smilingly tells me she feels "mush better".

## 2015-12-05 NOTE — Discharge Instructions (Signed)
Flank Pain Flank pain refers to pain that is located on the side of the body between the upper abdomen and the back. The pain may occur over a short period of time (acute) or may be long-term or reoccurring (chronic). It may be mild or severe. Flank pain can be caused by many things. CAUSES  Some of the more common causes of flank pain include:  Muscle strains.   Muscle spasms.   A disease of your spine (vertebral disk disease).   A lung infection (pneumonia).   Fluid around your lungs (pulmonary edema).   A kidney infection.   Kidney stones.   A very painful skin rash caused by the chickenpox virus (shingles).   Gallbladder disease.  HOME CARE INSTRUCTIONS  Home care will depend on the cause of your pain. In general,  Rest as directed by your caregiver.  Drink enough fluids to keep your urine clear or pale yellow.  Only take over-the-counter or prescription medicines as directed by your caregiver. Some medicines may help relieve the pain.  Tell your caregiver about any changes in your pain.  Follow up with your caregiver as directed. SEEK IMMEDIATE MEDICAL CARE IF:   Your pain is not controlled with medicine.   You have new or worsening symptoms.  Your pain increases.   You have abdominal pain.   You have shortness of breath.   You have persistent nausea or vomiting.   You have swelling in your abdomen.   You feel faint or pass out.   You have blood in your urine.  You have a fever or persistent symptoms for more than 2-3 days.  You have a fever and your symptoms suddenly get worse. MAKE SURE YOU:   Understand these instructions.  Will watch your condition.  Will get help right away if you are not doing well or get worse.   This information is not intended to replace advice given to you by your health care provider. Make sure you discuss any questions you have with your health care provider.   Document Released: 08/24/2005 Document  Revised: 03/27/2012 Document Reviewed: 02/15/2012 Elsevier Interactive Patient Education 2016 [employer]  Constipation, Adult Constipation is when a person has fewer than three bowel movements a week, has difficulty having a bowel movement, or has stools that are dry, hard, or larger than normal. As people grow older, constipation is more common. A low-fiber diet, not taking in enough fluids, and taking certain medicines may make constipation worse.  CAUSES   Certain medicines, such as antidepressants, pain medicine, iron supplements, antacids, and water pills.   Certain diseases, such as diabetes, irritable bowel syndrome (IBS), thyroid disease, or depression.   Not drinking enough water.   Not eating enough fiber-rich foods.   Stress or travel.   Lack of physical activity or exercise.   Ignoring the urge to have a bowel movement.   Using laxatives too much.  SIGNS AND SYMPTOMS   Having fewer than three bowel movements a week.   Straining to have a bowel movement.   Having stools that are hard, dry, or larger than normal.   Feeling full or bloated.   Pain in the lower abdomen.   Not feeling relief after having a bowel movement.  DIAGNOSIS  Your health care provider will take a medical history and perform a physical exam. Further testing may be done for severe constipation. Some tests may include:  A barium enema X-ray to examine your rectum, colon, and, sometimes, your  small intestine.   A sigmoidoscopy to examine your lower colon.   A colonoscopy to examine your entire colon. TREATMENT  Treatment will depend on the severity of your constipation and what is causing it. Some dietary treatments include drinking more fluids and eating more fiber-rich foods. Lifestyle treatments may include regular exercise. If these diet and lifestyle recommendations do not help, your health care provider may recommend taking over-the-counter laxative medicines to help  you have bowel movements. Prescription medicines may be prescribed if over-the-counter medicines do not work.  HOME CARE INSTRUCTIONS   Eat foods that have a lot of fiber, such as fruits, vegetables, whole grains, and beans.  Limit foods high in fat and processed sugars, such as french fries, hamburgers, cookies, candies, and soda.   A fiber supplement may be added to your diet if you cannot get enough fiber from foods.   Drink enough fluids to keep your urine clear or pale yellow.   Exercise regularly or as directed by your health care provider.   Go to the restroom when you have the urge to go. Do not hold it.   Only take over-the-counter or prescription medicines as directed by your health care provider. Do not take other medicines for constipation without talking to your health care provider first.  SEEK IMMEDIATE MEDICAL CARE IF:   You have bright red blood in your stool.   Your constipation lasts for more than 4 days or gets worse.   You have abdominal or rectal pain.   You have thin, pencil-like stools.   You have unexplained weight loss. MAKE SURE YOU:   Understand these instructions.  Will watch your condition.  Will get help right away if you are not doing well or get worse.   This information is not intended to replace advice given to you by your health care provider. Make sure you discuss any questions you have with your health care provider.   Document Released: 03/31/2004 Document Revised: 07/24/2014 Document Reviewed: 04/14/2013 Elsevier Interactive Patient Education 2016 Elsevier Inc.  Hematuria, Adult Hematuria is blood in your urine. It can be caused by a bladder infection, kidney infection, prostate infection, kidney stone, or cancer of your urinary tract. Infections can usually be treated with medicine, and a kidney stone usually will pass through your urine. If neither of these is the cause of your hematuria, further workup to find out the  reason may be needed. It is very important that you tell your health care provider about any blood you see in your urine, even if the blood stops without treatment or happens without causing pain. Blood in your urine that happens and then stops and then happens again can be a symptom of a very serious condition. Also, pain is not a symptom in the initial stages of many urinary cancers. HOME CARE INSTRUCTIONS   Drink lots of fluid, 3-4 quarts a day. If you have been diagnosed with an infection, cranberry juice is especially recommended, in addition to large amounts of water.  Avoid caffeine, tea, and carbonated beverages because they tend to irritate the bladder.  Avoid alcohol because it may irritate the prostate.  Take all medicines as directed by your health care provider.  If you were prescribed an antibiotic medicine, finish it all even if you start to feel better.  If you have been diagnosed with a kidney stone, follow your health care provider's instructions regarding straining your urine to catch the stone.  Empty your bladder often. Avoid holding  urine for long periods of time.  After a bowel movement, women should cleanse front to back. Use each tissue only once.  Empty your bladder before and after sexual intercourse if you are a female. SEEK MEDICAL CARE IF:  You develop back pain.  You have a fever.  You have a feeling of sickness in your stomach (nausea) or vomiting.  Your symptoms are not better in 3 days. Return sooner if you are getting worse. SEEK IMMEDIATE MEDICAL CARE IF:   You develop severe vomiting and are unable to keep the medicine down.  You develop severe back or abdominal pain despite taking your medicines.  You begin passing a large amount of blood or clots in your urine.  You feel extremely weak or faint, or you pass out. MAKE SURE YOU:   Understand these instructions.  Will watch your condition.  Will get help right away if you are not doing  well or get worse.   This information is not intended to replace advice given to you by your health care provider. Make sure you discuss any questions you have with your health care provider.   Document Released: 07/03/2005 Document Revised: 07/24/2014 Document Reviewed: 03/03/2013 Elsevier Interactive Patient Education [employer]

## 2016-01-31 IMAGING — US US PELVIS COMPLETE
1 series · 13 of 25 positions shown · non-contrast
Comparison: CT abdomen and pelvis 02/21/2010

CLINICAL DATA: LEFT lower quadrant and LEFT flank pain, question
ovarian torsion

EXAM:
TRANSABDOMINAL AND TRANSVAGINAL ULTRASOUND OF PELVIS
DOPPLER ULTRASOUND OF OVARIES
TECHNIQUE: Both transabdominal and transvaginal ultrasound examinations of the
pelvis were performed. Transabdominal technique was performed for
global imaging of the pelvis including uterus, ovaries, adnexal
regions, and pelvic cul-de-sac.
It was necessary to proceed with endovaginal exam following the
transabdominal exam to visualize blood flow in the ovaries. Color
and duplex Doppler ultrasound was utilized to evaluate blood flow to
the ovaries.

[Series 1: us pelvis complete · 0.18mm/px · 13 of 104 slices shown]
[im 1/104]
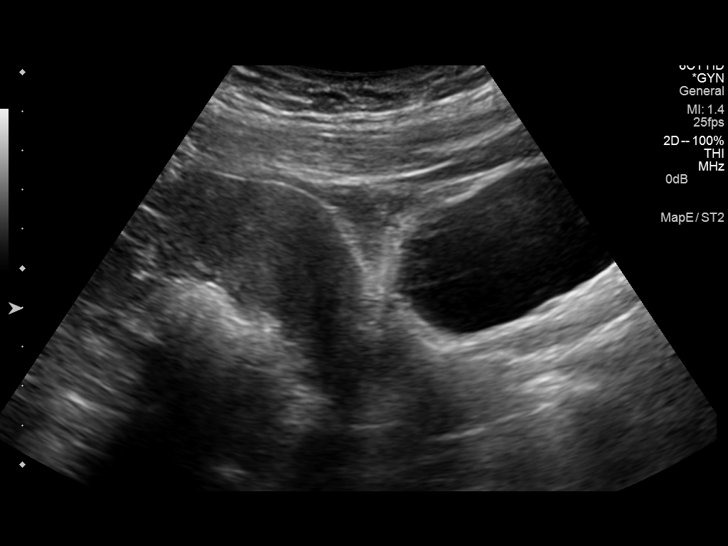
[im 9/104]
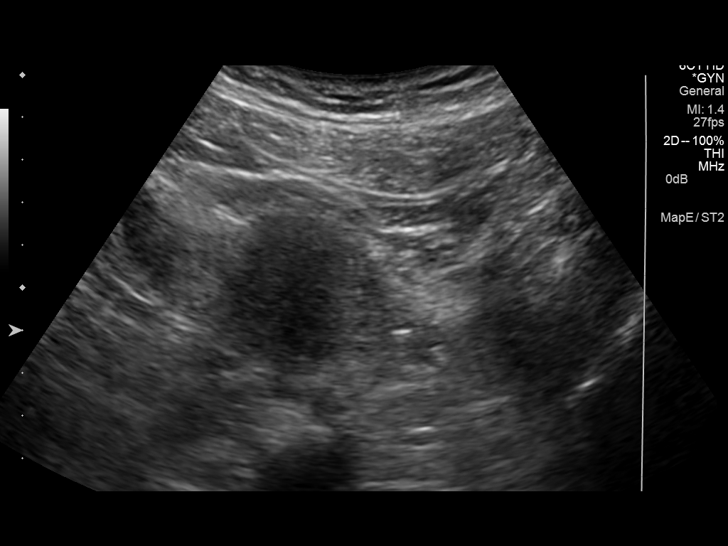
[im 18/104]
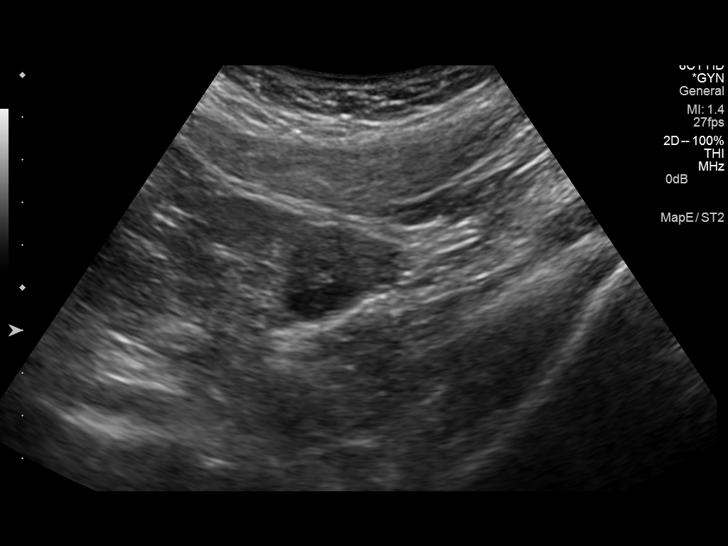
[im 26/104]
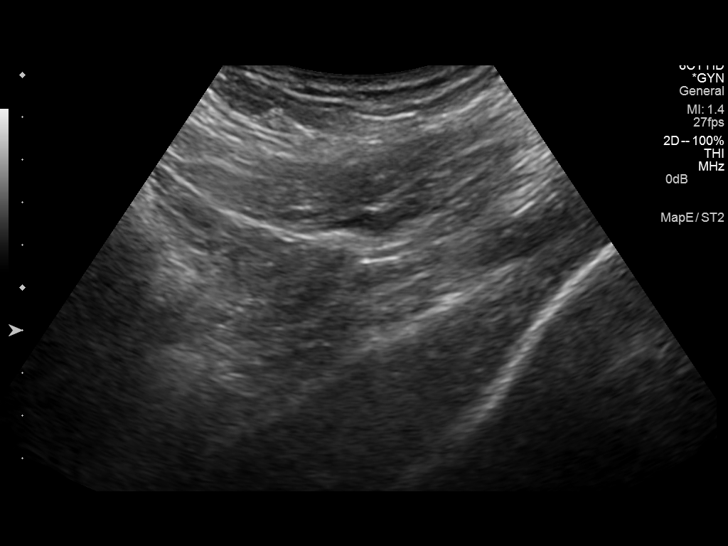
[im 35/104]
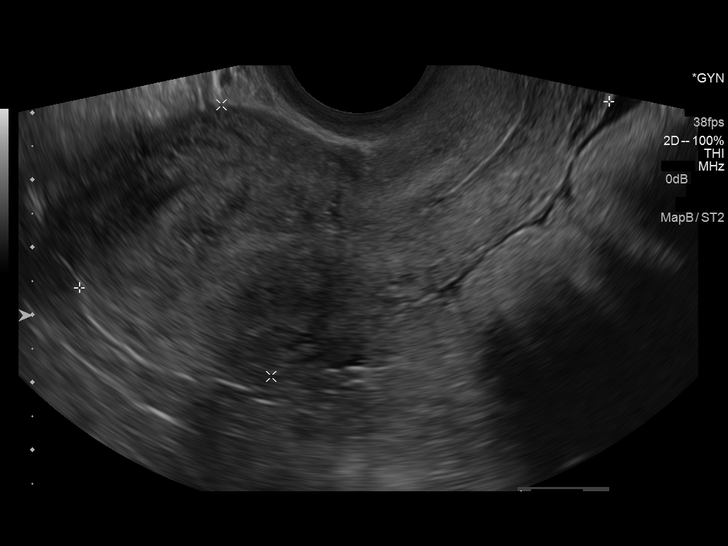
[im 43/104]
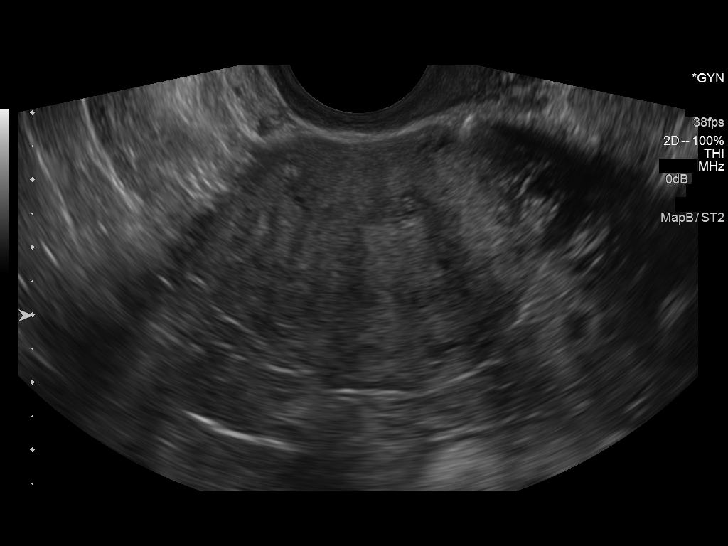
[im 52/104]
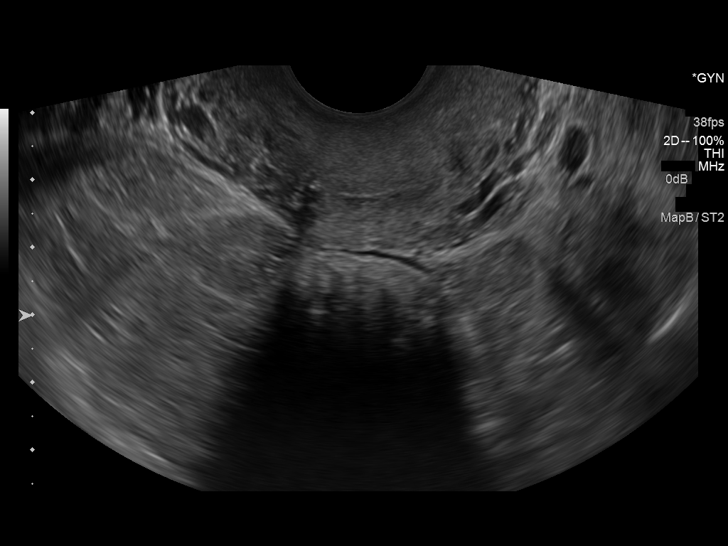
[im 61/104]
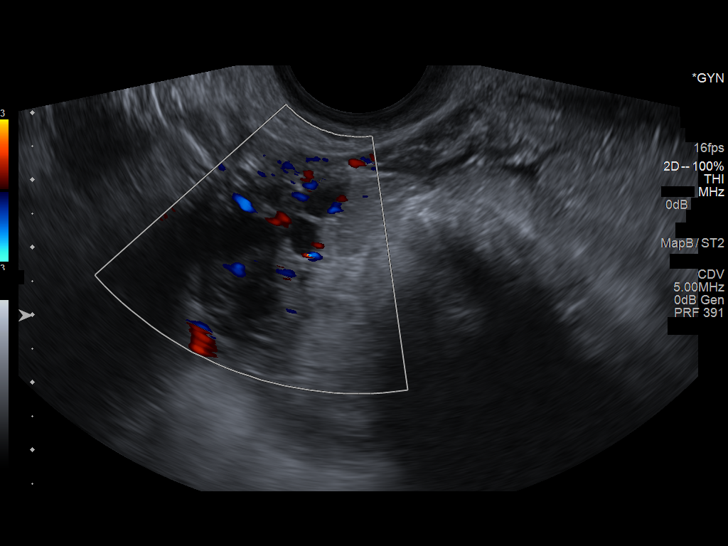
[im 69/104]
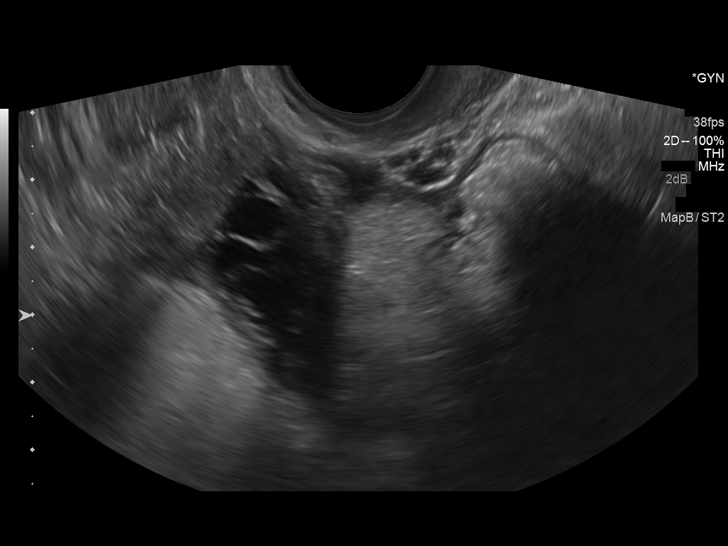
[im 78/104]
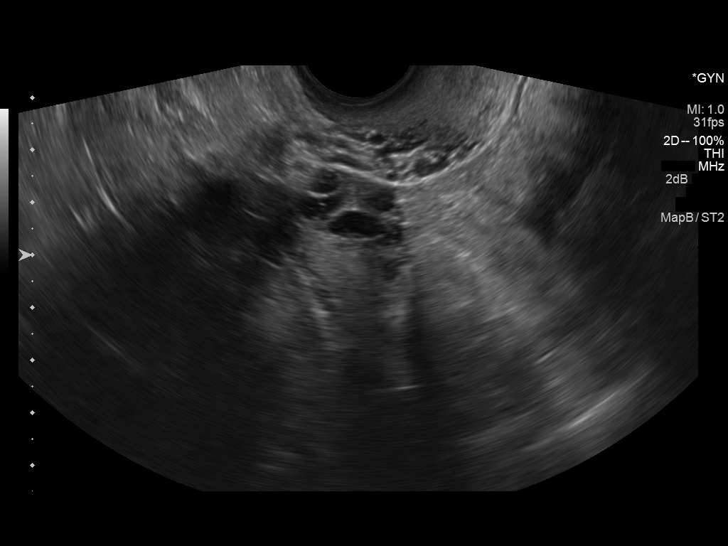
[im 86/104]
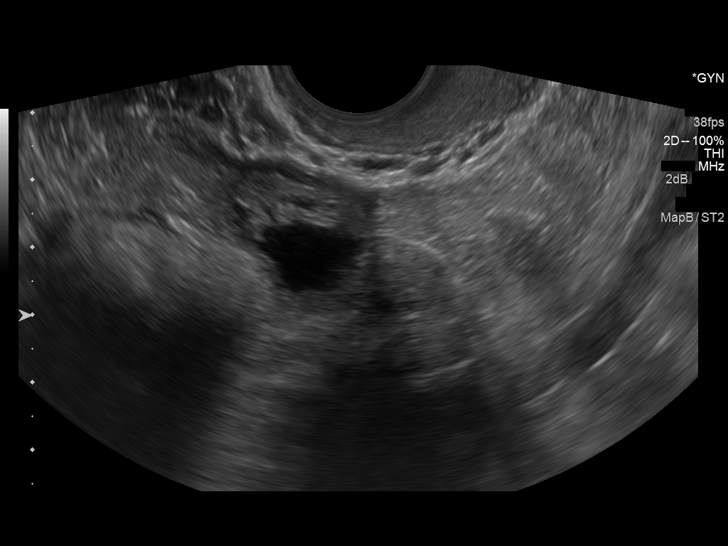
[im 95/104]
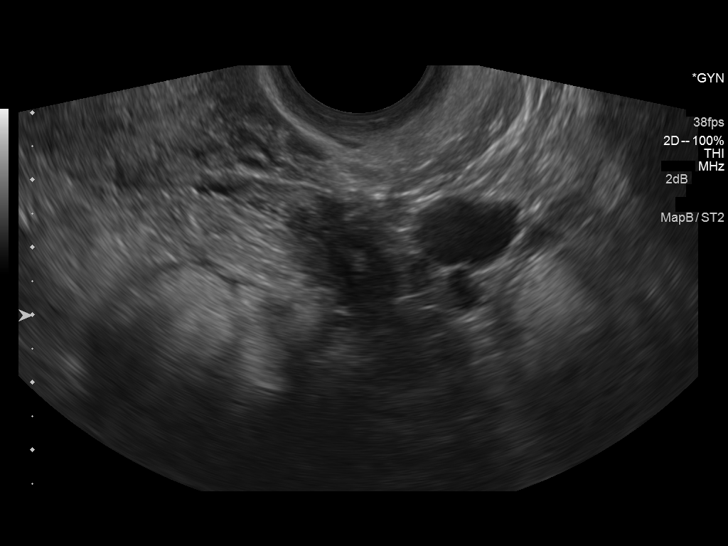
[im 104/104]
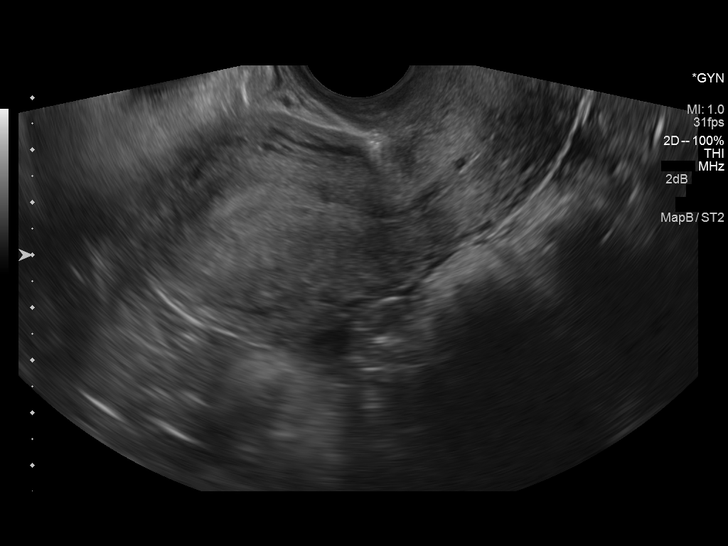

[13 of 25 positions shown; findings below may reference images not displayed]

FINDINGS: Uterus

Measurements: 8.3 x 4.1 x 5.1 cm. Normal morphology without mass.

Endometrium

Thickness: 12 mm thick, normal. No endometrial fluid or focal
abnormality.

Right ovary

Measurements: 3.2 x 1.6 x 1.9 cm. Normal morphology without mass.
Blood flow present within RIGHT ovary on color Doppler imaging.

Left ovary

Measurements: 3.8 x 2.3 x 2.7 cm. Normal morphology without mass.
Blood flow present within LEFT ovary on color Doppler imaging.

Pulsed Doppler evaluation of both ovaries demonstrates normal
low-resistance arterial and venous waveforms.

Other findings

Trace free pelvic fluid
IMPRESSION: Normal exam.

## 2016-02-07 ENCOUNTER — Ambulatory Visit (INDEPENDENT_AMBULATORY_CARE_PROVIDER_SITE_OTHER): Payer: BLUE CROSS/BLUE SHIELD | Admitting: Family Medicine

## 2016-02-07 VITALS — BP 120/82 | HR 87 | Temp 99.0°F | Resp 18 | Ht 68.0 in | Wt 260.0 lb

## 2016-02-07 DIAGNOSIS — Z111 Encounter for screening for respiratory tuberculosis: Secondary | ICD-10-CM | POA: Diagnosis not present

## 2016-02-07 DIAGNOSIS — E669 Obesity, unspecified: Secondary | ICD-10-CM

## 2016-02-07 DIAGNOSIS — I809 Phlebitis and thrombophlebitis of unspecified site: Secondary | ICD-10-CM

## 2016-02-07 DIAGNOSIS — Z0000 Encounter for general adult medical examination without abnormal findings: Secondary | ICD-10-CM | POA: Diagnosis not present

## 2016-02-07 NOTE — Patient Instructions (Addendum)
Work hard on trying to eat less and exercise more to try and lose weight  Use some over-the-counter hydrocortisone cream on the rash on the leg if it is itching. Otherwise just trying keep the skin moist with lotions  Continue using the MiraLAX intermittently as needed. I recommend also taking some Metamucil or Citrucel regularly and see if the combination will help work better.  Drink plenty of fluids  She had the first of the PPD testing done 5 days ago. She needs to return after 7-10 days for the second test to be applied. That means she needs to come in sometime after this Wednesday, then return in 48-72 hours after testing for the interpretation of the skin test  Make sure you get the remaining immunizations as scheduled.    IF you received an x-ray today, you will receive an invoice from [facility]. Please contact [facility] at [phone] with questions or concerns regarding your invoice.   IF you received labwork today, you will receive an invoice from [employer]. Please contact [facility] at [phone] with questions or concerns regarding your invoice.   Our billing staff will not be able to assist you with questions regarding bills from these companies.  You will be contacted with the lab results as soon as they are available. The fastest way to get your results is to activate your My Chart account. Instructions are located on the last page of this paperwork. If you have not heard from [place] regarding the results in 2 weeks, please contact this office.

## 2016-02-07 NOTE — Progress Notes (Signed)
Patient ID: Amy Mendoza, female    DOB: 13-Aug-1980  Age: 35 y.o. MRN: [id number]  Chief Complaint  Patient presents with  . Annual Exam    School    Subjective:   35 year old lady who is here for her physical examination with a form from for slight tach for her nursing school education. She is generally healthy and has no major problems today.  Past medical history: Medications: None Allergies: None Medical illnesses: History of asthma, not a current acute problem; she did have a kidney stone last year.  Surgical history: Cholecystectomy 35 child 35 years old  Family history: Mother is healthy. Father died at 34 of diabetes and hypertension. Family history in the grandparents is positive also for cancer and diabetes.  Social history: Does not smoke, occasional alcoholic beverage, does not use drugs. She is single but has a regular sexual partner she has been with for many years. 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 She has a high school education. She does not exercise regularly.  Review of systems: Constitutional: Unremarkable HEENT: Unremarkable Respiratory: Unremarkable Cardiovascular: Unremarkable Gastrointestinal: Chronic constipation Endocrine: Unremarkable Genitourinary: Unremarkable Muscular skeletal: Unremarkable Dermatologic: Has a area of superficial varicosity along the right ankle that itches. Allergy/immunology: Unremarkable Neurological: Unremarkable Hematologic: Unremarkable Psychiatric: Unremarkable   Current allergies, medications, problem list, past/family and social histories reviewed.  Objective:  BP 120/82 (BP Location: Left Arm)   Pulse 87   Temp 99 F (37.2 C) (Oral)   Resp 18   Ht 5\' 8"  (1.727 m)   Wt 260 lb (117.9 kg)   LMP 01/18/2016   SpO2 100%   BMI 39.53 kg/m   Alert oriented lady in no acute distress. She is significantly overweight. Her TMs are normal. Eyes PERRLA. EOMs intact. Throat clear with good  teeth. Neck supple without nodes or thyromegaly. No carotid bruits. Chest is clear to auscultation. Heart regular without murmurs. Breasts not examined. Abdomen soft without mass or tenderness. Pelvic exam not done done today. Extremities unremarkable. Skin unremarkable except for that placement and above on the right lower ankle, about 6 inches of palpable old fair paucity of superficial vein with a little dry skin and rash overlying it.  Assessment & Plan:   Assessment: 1. Annual physical exam   2. Tuberculosis screening   3. Obesity   4. Superficial phlebitis       Plan: Per instructions. Will check labs. She only got a single cup of urine clean-catch so we did not do the GC/chlamydia testing.  Orders Placed This Encounter  Procedures  . Comprehensive metabolic panel  . Hemoglobin A1c  . HIV antibody  . TSH  . RPR  . Lipid panel  . PPD    Order Specific Question:   Has patient ever tested positive?    Answer:   No    No orders of the defined types were placed in this encounter.        Patient Instructions   Work hard on trying to eat less and exercise more to try and lose weight  Use some over-the-counter hydrocortisone cream on the rash on the leg if it is itching. Otherwise just trying keep the skin moist with lotions  Continue using the MiraLAX intermittently as needed. I recommend also taking some Metamucil or Citrucel regularly and see if the combination will help work better.  Drink plenty of fluids  She had the first of the PPD testing done 5 days  ago. She needs to return after 7-10 days for the second test to be applied. That means she needs to come in sometime after this Wednesday, then return in 48-72 hours after testing for the interpretation of the skin test  Make sure you get the remaining immunizations as scheduled.    IF you received an x-ray today, you will receive an invoice from [facility]. Please contact [facility] at  [phone] with questions or concerns regarding your invoice.   IF you received labwork today, you will receive an invoice from [employer]. Please contact [facility] at [phone] with questions or concerns regarding your invoice.   Our billing staff will not be able to assist you with questions regarding bills from these companies.  You will be contacted with the lab results as soon as they are available. The fastest way to get your results is to activate your My Chart account. Instructions are located on the last page of this paperwork. If you have not heard from [place] regarding the results in 2 weeks, please contact this office.         Return if symptoms worsen or fail to improve.   HOPPER,DAVID, MD 02/07/2016

## 2016-02-08 LAB — COMPREHENSIVE METABOLIC PANEL
ALK PHOS: 61 U/L (ref 33–115)
ALT: 15 U/L (ref 6–29)
AST: 13 U/L (ref 10–30)
Albumin: 4 g/dL (ref 3.6–5.1)
BUN: 11 mg/dL (ref 7–25)
CALCIUM: 9.2 mg/dL (ref 8.6–10.2)
CO2: 27 mmol/L (ref 20–31)
CREATININE: 0.84 mg/dL (ref 0.50–1.10)
Chloride: 102 mmol/L (ref 98–110)
Glucose, Bld: 82 mg/dL (ref 65–99)
Potassium: 3.7 mmol/L (ref 3.5–5.3)
SODIUM: 138 mmol/L (ref 135–146)
TOTAL PROTEIN: 7.3 g/dL (ref 6.1–8.1)
Total Bilirubin: 0.6 mg/dL (ref 0.2–1.2)

## 2016-02-08 LAB — LIPID PANEL
CHOL/HDL RATIO: 4.2 ratio (ref <=5.0)
CHOLESTEROL: 169 mg/dL (ref 125–200)
HDL: 40 mg/dL — ABNORMAL LOW (ref >=46)
LDL CALC: 96 mg/dL (ref <130)
Triglycerides: 167 mg/dL — ABNORMAL HIGH (ref <150)
VLDL: 33 mg/dL — AB (ref <30)

## 2016-02-08 LAB — TSH: TSH: 1.86 mIU/L

## 2016-02-08 LAB — HIV ANTIBODY (ROUTINE TESTING W REFLEX): HIV 1&2 Ab, 4th Generation: NONREACTIVE

## 2016-02-09 ENCOUNTER — Ambulatory Visit (INDEPENDENT_AMBULATORY_CARE_PROVIDER_SITE_OTHER): Payer: BLUE CROSS/BLUE SHIELD | Admitting: Emergency Medicine

## 2016-02-09 VITALS — Ht 68.0 in

## 2016-02-09 DIAGNOSIS — Z23 Encounter for immunization: Secondary | ICD-10-CM

## 2016-02-09 LAB — RPR

## 2016-02-09 LAB — HEMOGLOBIN A1C
Hgb A1c MFr Bld: 5.5 % (ref <5.7)
Mean Plasma Glucose: 111 mg/dL

## 2016-02-09 NOTE — Progress Notes (Signed)
  Tuberculosis Risk Questionnaire  1. No Were you born outside the [place] in one of the following parts of the world: [place], [place], [place], [place] or [place]?    2. No Have you traveled outside the [place] and lived for more than one month in one of the following parts of the world: [place], [place], [place], [place] or [place]?    3. No Do you have a compromised immune system such as from any of the following conditions:HIV/AIDS, organ or bone marrow transplantation, diabetes, immunosuppressive medicines (e.g. Prednisone, Remicaide), leukemia, lymphoma, cancer of the head or neck, gastrectomy or jejunal bypass, end-stage renal disease (on dialysis), or silicosis?     4. Yes Health care  Have you ever or do you plan on working in: a residential care center, a health care facility, a jail or prison or homeless shelter?    5. No Have you ever: injected illegal drugs, used crack cocaine, lived in a homeless shelter  or been in jail or prison?     6. No Have you ever been exposed to anyone with infectious tuberculosis?    Tuberculosis Symptom Questionnaire  Do you currently have any of the following symptoms?  1. No Unexplained cough lasting more than 3 weeks?   2. No Unexplained fever lasting more than 3 weeks.   3. No Night Sweats (sweating that leaves the bedclothes and sheets wet)     4. No Shortness of Breath   5. No Chest Pain   6. No Unintentional weight loss    7. No Unexplained fatigue (very tired for no reason)   

## 2016-02-11 ENCOUNTER — Ambulatory Visit (INDEPENDENT_AMBULATORY_CARE_PROVIDER_SITE_OTHER): Payer: BLUE CROSS/BLUE SHIELD | Admitting: Urgent Care

## 2016-02-11 DIAGNOSIS — Z111 Encounter for screening for respiratory tuberculosis: Secondary | ICD-10-CM

## 2016-02-11 LAB — TB SKIN TEST
Induration: 0 mm
TB SKIN TEST: NEGATIVE

## 2016-02-11 NOTE — Progress Notes (Signed)
Patient presented today for a PPD reading only.

## 2016-05-03 ENCOUNTER — Ambulatory Visit (HOSPITAL_COMMUNITY)
Admission: EM | Admit: 2016-05-03 | Discharge: 2016-05-03 | Disposition: A | Discharge status: Home / Self Care | Payer: Self-pay | Attending: Family Medicine | Admitting: Family Medicine

## 2016-05-03 ENCOUNTER — Encounter (HOSPITAL_COMMUNITY): Payer: Self-pay | Admitting: Emergency Medicine

## 2016-05-03 DIAGNOSIS — J209 Acute bronchitis, unspecified: Secondary | ICD-10-CM

## 2016-05-03 MED ORDER — HYDROCOD POLST-CPM POLST ER 10-8 MG/5ML PO SUER: 5.0000 mL | Freq: Two times a day (BID) | ORAL | 0 refills | Status: AC | PRN

## 2016-05-03 MED ORDER — AZITHROMYCIN 250 MG PO TABS: 250.0000 mg | ORAL_TABLET | Freq: Once | ORAL | 0 refills | Status: AC

## 2016-05-03 NOTE — Discharge Instructions (Signed)
1) Take the cough syrup as prescribed. If this cough medicine is too expensive; then you may do over the counter delsym 2) Don't take the antibiotic yet. I really don't think you need it right now. But you may start taking it if you do not get better next week.

## 2016-05-03 NOTE — ED Triage Notes (Signed)
Patient reports onset of symptoms 10/08.  Initially had fever for 2 days and a sore throat-symptoms resolved, but a cough started.  Initially a dry cough, now a productive cough.  Reports family members have the same.

## 2016-05-03 NOTE — ED Provider Notes (Signed)
CSN: [id number]     Arrival date & time 05/03/16  1243 History   First MD Initiated Contact with Patient 05/03/16 1319     Chief Complaint  Patient presents with  . URI   (Consider location/radiation/quality/duration/timing/severity/associated sxs/prior Treatment) Amy Mendoza is a well-appearing 35 y.o female, with history of asthma, HLD, and gallstone,  presents today for 10-day hx of coughing. She also endorses running nose, some SOB and some wheezing. She felt like it was improving at one point but got worst again. She endorses fever of highest of 100.2 at home and chills. She reports her asthma is well controlled and only uses albuterol inhaler as needed during seasonal changes.       Past Medical History:  Diagnosis Date  . Asthma   . Gallstones   . Hyperlipemia    Past Surgical History:  Procedure Laterality Date  . CHOLECYSTECTOMY  feb 2006  . KNEE SURGERY     left   Family History  Problem Relation Age of Onset  . Diabetes Father     MGM, maternal uncle  . Prostate cancer Maternal Grandfather   . Breast cancer Maternal Aunt   . Colon cancer Neg Hx    Social History  Substance Use Topics  . Smoking status: Never Smoker  . Smokeless tobacco: Never Used  . Alcohol use No     Comment: occasionally   OB History    No data available     Review of Systems  Constitutional: Positive for chills and fever. Negative for fatigue.  HENT: Positive for rhinorrhea. Negative for sinus pressure, sneezing and sore throat.   Respiratory: Positive for cough, shortness of breath and wheezing.   Cardiovascular: Negative for chest pain and palpitations.  Gastrointestinal: Negative for abdominal pain, nausea and vomiting.  Musculoskeletal: Negative for myalgias.  Neurological: Negative for dizziness, weakness and headaches.    Allergies  Review of patient's allergies indicates no known allergies.  Home Medications   Prior to Admission medications   Medication Sig Start  Date End Date Taking? Authorizing Provider  IBUPROFEN PO Take by mouth.   Yes Historical Provider, MD  Pseudoeph-Chlorphen-DM (ROBITUSSIN PM PO) Take by mouth.   Yes Historical Provider, MD  azithromycin (ZITHROMAX Z-PAK) 250 MG tablet Take 1 tablet (250 mg total) by mouth once. Take 2 pills today, take 1 pill for day 2-5. 05/03/16 05/03/16  Amy Estelle, NP  chlorpheniramine-HYDROcodone [facility]) 10-8 MG/5ML SUER Take 5 mLs by mouth every 12 (twelve) hours as needed for cough. 05/03/16 05/10/16  Amy Estelle, NP  cyclobenzaprine (FLEXERIL) 10 MG tablet Take 1 tablet (10 mg total) by mouth 2 (two) times daily as needed for muscle spasms. Patient not taking: Reported on 05/03/2016 01/28/15   Shon Baton, MD  oxyCODONE-acetaminophen (PERCOCET/ROXICET) 5-325 MG per tablet Take 1 tablet by mouth every 6 (six) hours as needed for severe pain. Patient not taking: Reported on 05/03/2016 01/28/15   Shon Baton, MD  phentermine 37.5 MG capsule Take 37.5 mg by mouth every morning.    Historical Provider, MD  polyethylene glycol (MIRALAX) packet Take 17 g by mouth daily. Patient not taking: Reported on 05/03/2016 12/05/15   Benjiman Core, MD   Meds Ordered and Administered this Visit  Medications - No data to display  BP 110/70 (BP Location: Right Arm)   Pulse 94   Temp 98.5 F (36.9 C) (Oral)   Resp 18   LMP 04/09/2016   SpO2 99%  No data found.  Physical Exam  Constitutional: She is oriented to person, place, and time. She appears well-developed and well-nourished.  HENT:  Head: Normocephalic and atraumatic.  Right Ear: External ear normal.  Left Ear: External ear normal.  Nose: Nose normal.  Mouth/Throat: Oropharynx is clear and moist. No oropharyngeal exudate.  TM normal bilaterally  Eyes: Conjunctivae and EOM are normal. Pupils are equal, round, and reactive to light. Right eye exhibits no discharge. Left eye exhibits no discharge.  Neck: Normal range of  motion. Neck supple.  Tender to palpate at the R anterior cervical region. No lymphadenopathy   Cardiovascular: Normal rate, regular rhythm and normal heart sounds.   No murmur heard. Pulmonary/Chest: Effort normal and breath sounds normal. No respiratory distress. She has no wheezes.  Abdominal: Soft. Bowel sounds are normal. She exhibits no distension. There is no tenderness.  Lymphadenopathy:    She has no cervical adenopathy.  Neurological: She is alert and oriented to person, place, and time.  Skin: Skin is warm and dry.  Nursing note and vitals reviewed.   Urgent Care Course   Clinical Course    Procedures (including critical care time)  Labs Review Labs Reviewed - No data to display  Imaging Review No results found.  MDM   1. Acute bronchitis, unspecified organism    1) Physical exam normal. Symptoms most consistent with acute bronchitis 2) Tussionex rx for cough given. Informed that she may use OTC delsym or robitussin if this cough syrup is expensive for her 3) A wait-and-see rx for z-pack given. Informed to take only if she does not improve next week.  4) Patient denies any questions. Discharge paperwork given.     Amy EstelleFeng Kecia Swoboda, NP 05/03/16 1334

## 2016-05-04 ENCOUNTER — Telehealth (HOSPITAL_COMMUNITY): Payer: Self-pay | Admitting: Emergency Medicine

## 2016-05-04 NOTE — Telephone Encounter (Signed)
Returned pt's call... States she could not afford z-pack  [facility]... Adv pt to take it to another pharmacy first and if she's not able to resolve it, to call back   Pt verb understanding.

## 2017-01-18 ENCOUNTER — Ambulatory Visit (INDEPENDENT_AMBULATORY_CARE_PROVIDER_SITE_OTHER): Payer: BLUE CROSS/BLUE SHIELD | Admitting: Emergency Medicine

## 2017-01-18 ENCOUNTER — Encounter: Payer: Self-pay | Admitting: Emergency Medicine

## 2017-01-18 VITALS — BP 117/76 | HR 79 | Temp 98.3°F | Resp 18 | Ht 69.0 in | Wt 273.4 lb

## 2017-01-18 DIAGNOSIS — Z0000 Encounter for general adult medical examination without abnormal findings: Secondary | ICD-10-CM | POA: Diagnosis not present

## 2017-01-18 NOTE — Patient Instructions (Addendum)
   IF you received an x-ray today, you will receive an invoice from [facility]. Please contact [facility] at [phone] with questions or concerns regarding your invoice.   IF you received labwork today, you will receive an invoice from [facility]. Please contact [facility] at [phone] with questions or concerns regarding your invoice.   Our billing staff will not be able to assist you with questions regarding bills from these companies.  You will be contacted with the lab results as soon as they are available. The fastest way to get your results is to activate your My Chart account. Instructions are located on the last page of this paperwork. If you have not heard from us regarding the results in 2 weeks, please contact this office.     Health Maintenance, Female Adopting a healthy lifestyle and getting preventive care can go a long way to promote health and wellness. Talk with your health care provider about what schedule of regular examinations is right for you. This is a good chance for you to check in with your provider about disease prevention and staying healthy. In between checkups, there are plenty of things you can do on your own. Experts have done a lot of research about which lifestyle changes and preventive measures are most likely to keep you healthy. Ask your health care provider for more information. Weight and diet Eat a healthy diet  Be sure to include plenty of vegetables, fruits, low-fat dairy products, and lean protein.  Do not eat a lot of foods high in solid fats, added sugars, or salt.  Get regular exercise. This is one of the most important things you can do for your health. ? Most adults should exercise for at least 150 minutes each week. The exercise should increase your heart rate and make you sweat (moderate-intensity exercise). ? Most adults should also do strengthening exercises at least twice a week. This is in addition to the  moderate-intensity exercise.  Maintain a healthy weight  Body mass index (BMI) is a measurement that can be used to identify possible weight problems. It estimates body fat based on height and weight. Your health care provider can help determine your BMI and help you achieve or maintain a healthy weight.  For females 20 years of age and older: ? A BMI below 18.5 is considered underweight. ? A BMI of 18.5 to 24.9 is normal. ? A BMI of 25 to 29.9 is considered overweight. ? A BMI of 30 and above is considered obese.  Watch levels of cholesterol and blood lipids  You should start having your blood tested for lipids and cholesterol at 35 years of age, then have this test every 5 years.  You may need to have your cholesterol levels checked more often if: ? Your lipid or cholesterol levels are high. ? You are older than 35 years of age. ? You are at high risk for heart disease.  Cancer screening Lung Cancer  Lung cancer screening is recommended for adults 55-80 years old who are at high risk for lung cancer because of a history of smoking.  A yearly low-dose CT scan of the lungs is recommended for people who: ? Currently smoke. ? Have quit within the past 15 years. ? Have at least a 30-pack-year history of smoking. A pack year is smoking an average of one pack of cigarettes a day for 1 year.  Yearly screening should continue until it has been 15 years since you quit.  Yearly   screening should stop if you develop a health problem that would prevent you from having lung cancer treatment.  Breast Cancer  Practice breast self-awareness. This means understanding how your breasts normally appear and feel.  It also means doing regular breast self-exams. Let your health care provider know about any changes, no matter how small.  If you are in your 20s or 30s, you should have a clinical breast exam (CBE) by a health care provider every 1-3 years as part of a regular health exam.  If you  are 40 or older, have a CBE every year. Also consider having a breast X-ray (mammogram) every year.  If you have a family history of breast cancer, talk to your health care provider about genetic screening.  If you are at high risk for breast cancer, talk to your health care provider about having an MRI and a mammogram every year.  Breast cancer gene (BRCA) assessment is recommended for women who have family members with BRCA-related cancers. BRCA-related cancers include: ? Breast. ? Ovarian. ? Tubal. ? Peritoneal cancers.  Results of the assessment will determine the need for genetic counseling and BRCA1 and BRCA2 testing.  Cervical Cancer Your health care provider may recommend that you be screened regularly for cancer of the pelvic organs (ovaries, uterus, and vagina). This screening involves a pelvic examination, including checking for microscopic changes to the surface of your cervix (Pap test). You may be encouraged to have this screening done every 3 years, beginning at age 21.  For women ages 30-65, health care providers may recommend pelvic exams and Pap testing every 3 years, or they may recommend the Pap and pelvic exam, combined with testing for human papilloma virus (HPV), every 5 years. Some types of HPV increase your risk of cervical cancer. Testing for HPV may also be done on women of any age with unclear Pap test results.  Other health care providers may not recommend any screening for nonpregnant women who are considered low risk for pelvic cancer and who do not have symptoms. Ask your health care provider if a screening pelvic exam is right for you.  If you have had past treatment for cervical cancer or a condition that could lead to cancer, you need Pap tests and screening for cancer for at least 20 years after your treatment. If Pap tests have been discontinued, your risk factors (such as having a new sexual partner) need to be reassessed to determine if screening should  resume. Some women have medical problems that increase the chance of getting cervical cancer. In these cases, your health care provider may recommend more frequent screening and Pap tests.  Colorectal Cancer  This type of cancer can be detected and often prevented.  Routine colorectal cancer screening usually begins at 35 years of age and continues through 35 years of age.  Your health care provider may recommend screening at an earlier age if you have risk factors for colon cancer.  Your health care provider may also recommend using home test kits to check for hidden blood in the stool.  A small camera at the end of a tube can be used to examine your colon directly (sigmoidoscopy or colonoscopy). This is done to check for the earliest forms of colorectal cancer.  Routine screening usually begins at age 50.  Direct examination of the colon should be repeated every 5-10 years through 35 years of age. However, you may need to be screened more often if early forms of precancerous polyps   or small growths are found.  Skin Cancer  Check your skin from head to toe regularly.  Tell your health care provider about any new moles or changes in moles, especially if there is a change in a mole's shape or color.  Also tell your health care provider if you have a mole that is larger than the size of a pencil eraser.  Always use sunscreen. Apply sunscreen liberally and repeatedly throughout the day.  Protect yourself by wearing long sleeves, pants, a wide-brimmed hat, and sunglasses whenever you are outside.  Heart disease, diabetes, and high blood pressure  High blood pressure causes heart disease and increases the risk of stroke. High blood pressure is more likely to develop in: ? People who have blood pressure in the high end of the normal range (130-139/85-89 mm Hg). ? People who are overweight or obese. ? People who are African American.  If you are 18-39 years of age, have your blood  pressure checked every 3-5 years. If you are 40 years of age or older, have your blood pressure checked every year. You should have your blood pressure measured twice-once when you are at a hospital or clinic, and once when you are not at a hospital or clinic. Record the average of the two measurements. To check your blood pressure when you are not at a hospital or clinic, you can use: ? An automated blood pressure machine at a pharmacy. ? A home blood pressure monitor.  If you are between 55 years and 79 years old, ask your health care provider if you should take aspirin to prevent strokes.  Have regular diabetes screenings. This involves taking a blood sample to check your fasting blood sugar level. ? If you are at a normal weight and have a low risk for diabetes, have this test once every three years after 35 years of age. ? If you are overweight and have a high risk for diabetes, consider being tested at a younger age or more often. Preventing infection Hepatitis B  If you have a higher risk for hepatitis B, you should be screened for this virus. You are considered at high risk for hepatitis B if: ? You were born in a country where hepatitis B is common. Ask your health care provider which countries are considered high risk. ? Your parents were born in a high-risk country, and you have not been immunized against hepatitis B (hepatitis B vaccine). ? You have HIV or AIDS. ? You use needles to inject street drugs. ? You live with someone who has hepatitis B. ? You have had sex with someone who has hepatitis B. ? You get hemodialysis treatment. ? You take certain medicines for conditions, including cancer, organ transplantation, and autoimmune conditions.  Hepatitis C  Blood testing is recommended for: ? Everyone born from 1945 through 1965. ? Anyone with known risk factors for hepatitis C.  Sexually transmitted infections (STIs)  You should be screened for sexually transmitted  infections (STIs) including gonorrhea and chlamydia if: ? You are sexually active and are younger than 35 years of age. ? You are older than 35 years of age and your health care provider tells you that you are at risk for this type of infection. ? Your sexual activity has changed since you were last screened and you are at an increased risk for chlamydia or gonorrhea. Ask your health care provider if you are at risk.  If you do not have HIV, but are at risk,   risk, it may be recommended that you take a prescription medicine daily to prevent HIV infection. This is called pre-exposure prophylaxis (PrEP). You are considered at risk if: ? You are sexually active and do not regularly use condoms or know the HIV status of your partner(s). ? You take drugs by injection. ? You are sexually active with a partner who has HIV.  Talk with your health care provider about whether you are at high risk of being infected with HIV. If you choose to begin PrEP, you should first be tested for HIV. You should then be tested every 3 months for as long as you are taking PrEP. Pregnancy  If you are premenopausal and you may become pregnant, ask your health care provider about preconception counseling.  If you may become pregnant, take 400 to 800 micrograms (mcg) of folic acid every day.  If you want to prevent pregnancy, talk to your health care provider about birth control (contraception). Osteoporosis and menopause  Osteoporosis is a disease in which the bones lose minerals and strength with aging. This can result in serious bone fractures. Your risk for osteoporosis can be identified using a bone density scan.  If you are 75 years of age or older, or if you are at risk for osteoporosis and fractures, ask your health care provider if you should be screened.  Ask your health care provider whether you should take a calcium or vitamin D supplement to lower your risk for osteoporosis.  Menopause may have certain physical  symptoms and risks.  Hormone replacement therapy may reduce some of these symptoms and risks. Talk to your health care provider about whether hormone replacement therapy is right for you. Follow these instructions at home:  Schedule regular health, dental, and eye exams.  Stay current with your immunizations.  Do not use any tobacco products including cigarettes, chewing tobacco, or electronic cigarettes.  If you are pregnant, do not drink alcohol.  If you are breastfeeding, limit how much and how often you drink alcohol.  Limit alcohol intake to no more than 1 drink per day for nonpregnant women. One drink equals 12 ounces of beer, 5 ounces of wine, or 1 ounces of hard liquor.  Do not use street drugs.  Do not share needles.  Ask your health care provider for help if you need support or information about quitting drugs.  Tell your health care provider if you often feel depressed.  Tell your health care provider if you have ever been abused or do not feel safe at home. This information is not intended to replace advice given to you by your health care provider. Make sure you discuss any questions you have with your health care provider. Document Released: 01/16/2011 Document Revised: 12/09/2015 Document Reviewed: 04/06/2015 Elsevier Interactive Patient Education  2018 [employer].   Fat and Cholesterol Restricted Diet Getting too much fat and cholesterol in your diet may cause health problems. Following this diet helps keep your fat and cholesterol at normal levels. This can keep you from getting sick. What types of fat should I choose?  Choose monosaturated and polyunsaturated fats. These are found in foods such as olive oil, canola oil, flaxseeds, walnuts, almonds, and seeds.  Eat more omega-3 fats. Good choices include salmon, mackerel, sardines, tuna, flaxseed oil, and ground flaxseeds.  Limit saturated fats. These are in animal products such as meats, butter, and  cream. They can also be in plant products such as palm oil, palm kernel oil, and coconut  oil.  Avoid foods with partially hydrogenated oils in them. These contain trans fats. Examples of foods that have trans fats are stick margarine, some tub margarines, cookies, crackers, and other baked goods. What general guidelines do I need to follow?  Check food labels. Look for the words "trans fat" and "saturated fat."  When preparing a meal: ? Fill half of your plate with vegetables and green salads. ? Fill one fourth of your plate with whole grains. Look for the word "whole" as the first word in the ingredient list. ? Fill one fourth of your plate with lean protein foods.  Eat more foods that have fiber, like apples, carrots, beans, peas, and barley.  Eat more home-cooked foods. Eat less at restaurants and buffets.  Limit or avoid alcohol.  Limit foods high in starch and sugar.  Limit fried foods.  Cook foods without frying them. Baking, boiling, grilling, and broiling are all great options.  Lose weight if you are overweight. Losing even a small amount of weight can help your overall health. It can also help prevent diseases such as diabetes and heart disease. What foods can I eat? Grains Whole grains, such as whole wheat or whole grain breads, crackers, cereals, and pasta. Unsweetened oatmeal, bulgur, barley, quinoa, or brown rice. Corn or whole wheat flour tortillas. Vegetables Fresh or frozen vegetables (raw, steamed, roasted, or grilled). Green salads. Fruits All fresh, canned (in natural juice), or frozen fruits. Meat and Other Protein Products Ground beef (85% or leaner), grass-fed beef, or beef trimmed of fat. Skinless chicken or [place]. Ground chicken or [place]. Pork trimmed of fat. All fish and seafood. Eggs. Dried beans, peas, or lentils. Unsalted nuts or seeds. Unsalted canned or dry beans. Dairy Low-fat dairy products, such as skim or 1% milk, 2% or reduced-fat cheeses,  low-fat ricotta or cottage cheese, or plain low-fat yogurt. Fats and Oils Tub margarines without trans fats. Light or reduced-fat mayonnaise and salad dressings. Avocado. Olive, canola, sesame, or safflower oils. Natural peanut or almond butter (choose ones without added sugar and oil). The items listed above may not be a complete list of recommended foods or beverages. Contact your dietitian for more options. What foods are not recommended? Grains White bread. White pasta. White rice. Cornbread. Bagels, pastries, and croissants. Crackers that contain trans fat. Vegetables White potatoes. Corn. Creamed or fried vegetables. Vegetables in a cheese sauce. Fruits Dried fruits. Canned fruit in light or heavy syrup. Fruit juice. Meat and Other Protein Products Fatty cuts of meat. Ribs, chicken wings, bacon, sausage, bologna, salami, chitterlings, fatback, hot dogs, bratwurst, and packaged luncheon meats. Liver and organ meats. Dairy Whole or 2% milk, cream, half-and-half, and cream cheese. Whole milk cheeses. Whole-fat or sweetened yogurt. Full-fat cheeses. Nondairy creamers and whipped toppings. Processed cheese, cheese spreads, or cheese curds. Sweets and Desserts Corn syrup, sugars, honey, and molasses. Candy. Jam and jelly. Syrup. Sweetened cereals. Cookies, pies, cakes, donuts, muffins, and ice cream. Fats and Oils Butter, stick margarine, lard, shortening, ghee, or bacon fat. Coconut, palm kernel, or palm oils. Beverages Alcohol. Sweetened drinks (such as sodas, lemonade, and fruit drinks or punches). The items listed above may not be a complete list of foods and beverages to avoid. Contact your dietitian for more information. This information is not intended to replace advice given to you by your health care provider. Make sure you discuss any questions you have with your health care provider. Document Released: 01/02/2012 Document Revised: 03/09/2016 Document Reviewed: 10/02/2013 Elsevier  Interactive  Patient Education  2018 [facility]) Exercise Recommendation  Being physically active is important to prevent heart disease and stroke, the nation's No. 1and No. 5killers. To improve overall cardiovascular health, we suggest at least 150 minutes per week of moderate exercise or 75 minutes per week of vigorous exercise (or a combination of moderate and vigorous activity). Thirty minutes a day, five times a week is an easy goal to remember. You will also experience benefits even if you divide your time into two or three segments of 10 to 15 minutes per day.  For people who would benefit from lowering their blood pressure or cholesterol, we recommend 40 minutes of aerobic exercise of moderate to vigorous intensity three to four times a week to lower the risk for heart attack and stroke.  Physical activity is anything that makes you move your body and burn calories.  This includes things like climbing stairs or playing sports. Aerobic exercises benefit your heart, and include walking, jogging, swimming or biking. Strength and stretching exercises are best for overall stamina and flexibility.  The simplest, positive change you can make to effectively improve your heart health is to start walking. It's enjoyable, free, easy, social and great exercise. A walking program is flexible and boasts high success rates because people can stick with it. It's easy for walking to become a regular and satisfying part of life.   For Overall Cardiovascular Health:  At least 30 minutes of moderate-intensity aerobic activity at least 5 days per week for a total of 150  OR   At least 25 minutes of vigorous aerobic activity at least 3 days per week for a total of 75 minutes; or a combination of moderate- and vigorous-intensity aerobic activity  AND   Moderate- to high-intensity muscle-strengthening activity at least 2 days per week for additional health  benefits.  For Lowering Blood Pressure and Cholesterol  An average 40 minutes of moderate- to vigorous-intensity aerobic activity 3 or 4 times per week  What if I can't make it to the time goal? Something is always better than nothing! And everyone has to start somewhere. Even if you've been sedentary for years, today is the day you can begin to make healthy changes in your life. If you don't think you'll make it for 30 or 40 minutes, set a reachable goal for today. You can work up toward your overall goal by increasing your time as you get stronger. Don't let all-or-nothing thinking rob you of doing what you can every day.  Source:http://www.heart.org

## 2017-01-18 NOTE — Progress Notes (Signed)
Amy Mendoza 36 y.o.   Chief Complaint  Patient presents with  . Annual Exam    No PAP needed    HISTORY OF PRESENT ILLNESS: This is a 36 y.o. female here for annual exam and needs school form filled out. Refusing blood work.  HPI   Prior to Admission medications   Medication Sig Start Date End Date Taking? Authorizing Provider  cyclobenzaprine (FLEXERIL) 10 MG tablet Take 1 tablet (10 mg total) by mouth 2 (two) times daily as needed for muscle spasms. Patient not taking: Reported on 05/03/2016 01/28/15   Horton, Barbette Hair, MD  IBUPROFEN PO Take by mouth.    [provider]  oxyCODONE-acetaminophen (PERCOCET/ROXICET) 5-325 MG per tablet Take 1 tablet by mouth every 6 (six) hours as needed for severe pain. Patient not taking: Reported on 05/03/2016 01/28/15   Horton, Barbette Hair, MD  phentermine 37.5 MG capsule Take 37.5 mg by mouth every morning.    [provider]  polyethylene glycol (MIRALAX) packet Take 17 g by mouth daily. Patient not taking: Reported on 05/03/2016 12/05/15   Davonna Belling, MD  Pseudoeph-Chlorphen-DM (ROBITUSSIN PM PO) Take by mouth.    [provider]    No Known Allergies  There are no active problems to display for this patient.   Past Medical History:  Diagnosis Date  . Asthma   . Gallstones   . Hyperlipemia     Past Surgical History:  Procedure Laterality Date  . CHOLECYSTECTOMY  feb 2006  . KNEE SURGERY     left    Social History   Social History  . Marital status: Single    Spouse name: N/A  . Number of children: 1  . Years of education: N/A   Occupational History  . cashier    Social History Main Topics  . Smoking status: Never Smoker  . Smokeless tobacco: Never Used  . Alcohol use Yes     Comment: occasionally  . Drug use: No  . Sexual activity: Not on file   Other Topics Concern  . Not on file   Social History Narrative  . No narrative on file    Family History  Problem Relation  Age of Onset  . Diabetes Father        MGM, maternal uncle  . Prostate cancer Maternal Grandfather   . Breast cancer Maternal Aunt   . Colon cancer Neg Hx      Review of Systems  Constitutional: Negative.  Negative for chills and fever.  HENT: Negative.   Eyes: Negative.   Respiratory: Negative.  Negative for cough and shortness of breath.   Cardiovascular: Negative.  Negative for chest pain.  Gastrointestinal: Negative.  Negative for abdominal pain, diarrhea, nausea and vomiting.  Genitourinary: Negative.   Musculoskeletal: Negative.  Negative for back pain, myalgias and neck pain.  Skin: Negative.  Negative for rash.  Neurological: Negative.  Negative for dizziness, focal weakness and headaches.  Endo/Heme/Allergies: Negative.   All other systems reviewed and are negative.    Physical Exam  Constitutional: She is oriented to person, place, and time. She appears well-developed.  Obese.  HENT:  Head: Normocephalic and atraumatic.  Right Ear: External ear normal.  Left Ear: External ear normal.  Nose: Nose normal.  Mouth/Throat: Oropharynx is clear and moist.  Eyes: Conjunctivae and EOM are normal. Pupils are equal, round, and reactive to light.  Neck: Normal range of motion. Neck supple. No JVD present.  Cardiovascular: Normal rate, regular rhythm  and normal heart sounds.   Pulmonary/Chest: Effort normal and breath sounds normal.  Abdominal: Soft. Bowel sounds are normal. She exhibits no distension. There is no tenderness.  Musculoskeletal: Normal range of motion.  Lymphadenopathy:    She has no cervical adenopathy.  Neurological: She is alert and oriented to person, place, and time. No sensory deficit. She exhibits normal muscle tone.  Skin: Skin is warm and dry.  Psychiatric: She has a normal mood and affect. Her behavior is normal.  Vitals reviewed.    ASSESSMENT & PLAN: Amy Mendoza was seen today for annual exam.  Diagnoses and all orders for this  visit:  Routine general medical examination at a health care facility  School form filled out.  Patient Instructions       IF you received an x-ray today, you will receive an invoice from [facility]. Please contact [facility] at [phone] with questions or concerns regarding your invoice.   IF you received labwork today, you will receive an invoice from [facility]. Please contact [facility] at [phone] with questions or concerns regarding your invoice.   Our billing staff will not be able to assist you with questions regarding bills from these companies.  You will be contacted with the lab results as soon as they are available. The fastest way to get your results is to activate your My Chart account. Instructions are located on the last page of this paperwork. If you have not heard from [place] regarding the results in 2 weeks, please contact this office.       Health Maintenance, Female Adopting a healthy lifestyle and getting preventive care can go a long way to promote health and wellness. Talk with your health care provider about what schedule of regular examinations is right for you. This is a good chance for you to check in with your provider about disease prevention and staying healthy. In between checkups, there are plenty of things you can do on your own. Experts have done a lot of research about which lifestyle changes and preventive measures are most likely to keep you healthy. Ask your health care provider for more information. Weight and diet Eat a healthy diet  Be sure to include plenty of vegetables, fruits, low-fat dairy products, and lean protein.  Do not eat a lot of foods high in solid fats, added sugars, or salt.  Get regular exercise. This is one of the most important things you can do for your health. ? Most adults should exercise for at least 150 minutes each week. The exercise should increase your heart rate and make you sweat  (moderate-intensity exercise). ? Most adults should also do strengthening exercises at least twice a week. This is in addition to the moderate-intensity exercise.  Maintain a healthy weight  Body mass index (BMI) is a measurement that can be used to identify possible weight problems. It estimates body fat based on height and weight. Your health care provider can help determine your BMI and help you achieve or maintain a healthy weight.  For females 94 years of age and older: ? A BMI below 18.5 is considered underweight. ? A BMI of 18.5 to 24.9 is normal. ? A BMI of 25 to 29.9 is considered overweight. ? A BMI of 30 and above is considered obese.  Watch levels of cholesterol and blood lipids  You should start having your blood tested for lipids and cholesterol at 35 years of age, then have this test every 5 years.  You may need  to have your cholesterol levels checked more often if: ? Your lipid or cholesterol levels are high. ? You are older than 35 years of age. ? You are at high risk for heart disease.  Cancer screening Lung Cancer  Lung cancer screening is recommended for adults 71-30 years old who are at high risk for lung cancer because of a history of smoking.  A yearly low-dose CT scan of the lungs is recommended for people who: ? Currently smoke. ? Have quit within the past 15 years. ? Have at least a 30-pack-year history of smoking. A pack year is smoking an average of one pack of cigarettes a day for 1 year.  Yearly screening should continue until it has been 15 years since you quit.  Yearly screening should stop if you develop a health problem that would prevent you from having lung cancer treatment.  Breast Cancer  Practice breast self-awareness. This means understanding how your breasts normally appear and feel.  It also means doing regular breast self-exams. Let your health care provider know about any changes, no matter how small.  If you are in your 20s or  30s, you should have a clinical breast exam (CBE) by a health care provider every 1-3 years as part of a regular health exam.  If you are 50 or older, have a CBE every year. Also consider having a breast X-ray (mammogram) every year.  If you have a family history of breast cancer, talk to your health care provider about genetic screening.  If you are at high risk for breast cancer, talk to your health care provider about having an MRI and a mammogram every year.  Breast cancer gene (BRCA) assessment is recommended for women who have family members with BRCA-related cancers. BRCA-related cancers include: ? Breast. ? Ovarian. ? Tubal. ? Peritoneal cancers.  Results of the assessment will determine the need for genetic counseling and BRCA1 and BRCA2 testing.  Cervical Cancer Your health care provider may recommend that you be screened regularly for cancer of the pelvic organs (ovaries, uterus, and vagina). This screening involves a pelvic examination, including checking for microscopic changes to the surface of your cervix (Pap test). You may be encouraged to have this screening done every 3 years, beginning at age 51.  For women ages 61-65, health care providers may recommend pelvic exams and Pap testing every 3 years, or they may recommend the Pap and pelvic exam, combined with testing for human papilloma virus (HPV), every 5 years. Some types of HPV increase your risk of cervical cancer. Testing for HPV may also be done on women of any age with unclear Pap test results.  Other health care providers may not recommend any screening for nonpregnant women who are considered low risk for pelvic cancer and who do not have symptoms. Ask your health care provider if a screening pelvic exam is right for you.  If you have had past treatment for cervical cancer or a condition that could lead to cancer, you need Pap tests and screening for cancer for at least 20 years after your treatment. If Pap tests  have been discontinued, your risk factors (such as having a new sexual partner) need to be reassessed to determine if screening should resume. Some women have medical problems that increase the chance of getting cervical cancer. In these cases, your health care provider may recommend more frequent screening and Pap tests.  Colorectal Cancer  This type of cancer can be detected and often prevented.  Routine colorectal cancer screening usually begins at 35 years of age and continues through 35 years of age.  Your health care provider may recommend screening at an earlier age if you have risk factors for colon cancer.  Your health care provider may also recommend using home test kits to check for hidden blood in the stool.  A small camera at the end of a tube can be used to examine your colon directly (sigmoidoscopy or colonoscopy). This is done to check for the earliest forms of colorectal cancer.  Routine screening usually begins at age 26.  Direct examination of the colon should be repeated every 5-10 years through 35 years of age. However, you may need to be screened more often if early forms of precancerous polyps or small growths are found.  Skin Cancer  Check your skin from head to toe regularly.  Tell your health care provider about any new moles or changes in moles, especially if there is a change in a mole's shape or color.  Also tell your health care provider if you have a mole that is larger than the size of a pencil eraser.  Always use sunscreen. Apply sunscreen liberally and repeatedly throughout the day.  Protect yourself by wearing long sleeves, pants, a wide-brimmed hat, and sunglasses whenever you are outside.  Heart disease, diabetes, and high blood pressure  High blood pressure causes heart disease and increases the risk of stroke. High blood pressure is more likely to develop in: ? People who have blood pressure in the high end of the normal range (130-139/85-89 mm  Hg). ? People who are overweight or obese. ? People who are African American.  If you are 55-44 years of age, have your blood pressure checked every 3-5 years. If you are 48 years of age or older, have your blood pressure checked every year. You should have your blood pressure measured twice-once when you are at a hospital or clinic, and once when you are not at a hospital or clinic. Record the average of the two measurements. To check your blood pressure when you are not at a hospital or clinic, you can use: ? An automated blood pressure machine at a pharmacy. ? A home blood pressure monitor.  If you are between 61 years and 28 years old, ask your health care provider if you should take aspirin to prevent strokes.  Have regular diabetes screenings. This involves taking a blood sample to check your fasting blood sugar level. ? If you are at a normal weight and have a low risk for diabetes, have this test once every three years after 35 years of age. ? If you are overweight and have a high risk for diabetes, consider being tested at a younger age or more often. Preventing infection Hepatitis B  If you have a higher risk for hepatitis B, you should be screened for this virus. You are considered at high risk for hepatitis B if: ? You were born in a country where hepatitis B is common. Ask your health care provider which countries are considered high risk. ? Your parents were born in a high-risk country, and you have not been immunized against hepatitis B (hepatitis B vaccine). ? You have HIV or AIDS. ? You use needles to inject street drugs. ? You live with someone who has hepatitis B. ? You have had sex with someone who has hepatitis B. ? You get hemodialysis treatment. ? You take certain medicines for conditions, including cancer, organ transplantation,  and autoimmune conditions.  Hepatitis C  Blood testing is recommended for: ? Everyone born from [birth year] through 1964. ? Anyone with known  risk factors for hepatitis C.  Sexually transmitted infections (STIs)  You should be screened for sexually transmitted infections (STIs) including gonorrhea and chlamydia if: ? You are sexually active and are younger than 35 years of age. ? You are older than 35 years of age and your health care provider tells you that you are at risk for this type of infection. ? Your sexual activity has changed since you were last screened and you are at an increased risk for chlamydia or gonorrhea. Ask your health care provider if you are at risk.  If you do not have HIV, but are at risk, it may be recommended that you take a prescription medicine daily to prevent HIV infection. This is called pre-exposure prophylaxis (PrEP). You are considered at risk if: ? You are sexually active and do not regularly use condoms or know the HIV status of your partner(s). ? You take drugs by injection. ? You are sexually active with a partner who has HIV.  Talk with your health care provider about whether you are at high risk of being infected with HIV. If you choose to begin PrEP, you should first be tested for HIV. You should then be tested every 3 months for as long as you are taking PrEP. Pregnancy  If you are premenopausal and you may become pregnant, ask your health care provider about preconception counseling.  If you may become pregnant, take 400 to 800 micrograms (mcg) of folic acid every day.  If you want to prevent pregnancy, talk to your health care provider about birth control (contraception). Osteoporosis and menopause  Osteoporosis is a disease in which the bones lose minerals and strength with aging. This can result in serious bone fractures. Your risk for osteoporosis can be identified using a bone density scan.  If you are 104 years of age or older, or if you are at risk for osteoporosis and fractures, ask your health care provider if you should be screened.  Ask your health care provider whether you  should take a calcium or vitamin D supplement to lower your risk for osteoporosis.  Menopause may have certain physical symptoms and risks.  Hormone replacement therapy may reduce some of these symptoms and risks. Talk to your health care provider about whether hormone replacement therapy is right for you. Follow these instructions at home:  Schedule regular health, dental, and eye exams.  Stay current with your immunizations.  Do not use any tobacco products including cigarettes, chewing tobacco, or electronic cigarettes.  If you are pregnant, do not drink alcohol.  If you are breastfeeding, limit how much and how often you drink alcohol.  Limit alcohol intake to no more than 1 drink per day for nonpregnant women. One drink equals 12 ounces of beer, 5 ounces of wine, or 1 ounces of hard liquor.  Do not use street drugs.  Do not share needles.  Ask your health care provider for help if you need support or information about quitting drugs.  Tell your health care provider if you often feel depressed.  Tell your health care provider if you have ever been abused or do not feel safe at home. This information is not intended to replace advice given to you by your health care provider. Make sure you discuss any questions you have with your health care provider. Document Released: 01/16/2011 Document  Revised: 12/09/2015 Document Reviewed: 04/06/2015 Elsevier Interactive Patient Education  2018 [employer].   Fat and Cholesterol Restricted Diet Getting too much fat and cholesterol in your diet may cause health problems. Following this diet helps keep your fat and cholesterol at normal levels. This can keep you from getting sick. What types of fat should I choose?  Choose monosaturated and polyunsaturated fats. These are found in foods such as olive oil, canola oil, flaxseeds, walnuts, almonds, and seeds.  Eat more omega-3 fats. Good choices include salmon, mackerel, sardines, tuna,  flaxseed oil, and ground flaxseeds.  Limit saturated fats. These are in animal products such as meats, butter, and cream. They can also be in plant products such as palm oil, palm kernel oil, and coconut oil.  Avoid foods with partially hydrogenated oils in them. These contain trans fats. Examples of foods that have trans fats are stick margarine, some tub margarines, cookies, crackers, and other baked goods. What general guidelines do I need to follow?  Check food labels. Look for the words "trans fat" and "saturated fat."  When preparing a meal: ? Fill half of your plate with vegetables and green salads. ? Fill one fourth of your plate with whole grains. Look for the word "whole" as the first word in the ingredient list. ? Fill one fourth of your plate with lean protein foods.  Eat more foods that have fiber, like apples, carrots, beans, peas, and barley.  Eat more home-cooked foods. Eat less at restaurants and buffets.  Limit or avoid alcohol.  Limit foods high in starch and sugar.  Limit fried foods.  Cook foods without frying them. Baking, boiling, grilling, and broiling are all great options.  Lose weight if you are overweight. Losing even a small amount of weight can help your overall health. It can also help prevent diseases such as diabetes and heart disease. What foods can I eat? Grains Whole grains, such as whole wheat or whole grain breads, crackers, cereals, and pasta. Unsweetened oatmeal, bulgur, barley, quinoa, or brown rice. Corn or whole wheat flour tortillas. Vegetables Fresh or frozen vegetables (raw, steamed, roasted, or grilled). Green salads. Fruits All fresh, canned (in natural juice), or frozen fruits. Meat and Other Protein Products Ground beef (85% or leaner), grass-fed beef, or beef trimmed of fat. Skinless chicken or [place]. Ground chicken or [place]. Pork trimmed of fat. All fish and seafood. Eggs. Dried beans, peas, or lentils. Unsalted nuts or seeds.  Unsalted canned or dry beans. Dairy Low-fat dairy products, such as skim or 1% milk, 2% or reduced-fat cheeses, low-fat ricotta or cottage cheese, or plain low-fat yogurt. Fats and Oils Tub margarines without trans fats. Light or reduced-fat mayonnaise and salad dressings. Avocado. Olive, canola, sesame, or safflower oils. Natural peanut or almond butter (choose ones without added sugar and oil). The items listed above may not be a complete list of recommended foods or beverages. Contact your dietitian for more options. What foods are not recommended? Grains White bread. White pasta. White rice. Cornbread. Bagels, pastries, and croissants. Crackers that contain trans fat. Vegetables White potatoes. Corn. Creamed or fried vegetables. Vegetables in a cheese sauce. Fruits Dried fruits. Canned fruit in light or heavy syrup. Fruit juice. Meat and Other Protein Products Fatty cuts of meat. Ribs, chicken wings, bacon, sausage, bologna, salami, chitterlings, fatback, hot dogs, bratwurst, and packaged luncheon meats. Liver and organ meats. Dairy Whole or 2% milk, cream, half-and-half, and cream cheese. Whole milk cheeses. Whole-fat or sweetened yogurt. Full-fat cheeses.  Nondairy creamers and whipped toppings. Processed cheese, cheese spreads, or cheese curds. Sweets and Desserts Corn syrup, sugars, honey, and molasses. Candy. Jam and jelly. Syrup. Sweetened cereals. Cookies, pies, cakes, donuts, muffins, and ice cream. Fats and Oils Butter, stick margarine, lard, shortening, ghee, or bacon fat. Coconut, palm kernel, or palm oils. Beverages Alcohol. Sweetened drinks (such as sodas, lemonade, and fruit drinks or punches). The items listed above may not be a complete list of foods and beverages to avoid. Contact your dietitian for more information. This information is not intended to replace advice given to you by your health care provider. Make sure you discuss any questions you have with your health  care provider. Document Released: 01/02/2012 Document Revised: 03/09/2016 Document Reviewed: 10/02/2013 Elsevier Interactive Patient Education  2018 [place] (AHA) Exercise Recommendation  Being physically active is important to prevent heart disease and stroke, the nation's No. 1and No. 5killers. To improve overall cardiovascular health, we suggest at least 150 minutes per week of moderate exercise or 75 minutes per week of vigorous exercise (or a combination of moderate and vigorous activity). Thirty minutes a day, five times a week is an easy goal to remember. You will also experience benefits even if you divide your time into two or three segments of 10 to 15 minutes per day.  For people who would benefit from lowering their blood pressure or cholesterol, we recommend 40 minutes of aerobic exercise of moderate to vigorous intensity three to four times a week to lower the risk for heart attack and stroke.  Physical activity is anything that makes you move your body and burn calories.  This includes things like climbing stairs or playing sports. Aerobic exercises benefit your heart, and include walking, jogging, swimming or biking. Strength and stretching exercises are best for overall stamina and flexibility.  The simplest, positive change you can make to effectively improve your heart health is to start walking. It's enjoyable, free, easy, social and great exercise. A walking program is flexible and boasts high success rates because people can stick with it. It's easy for walking to become a regular and satisfying part of life.   For Overall Cardiovascular Health:  At least 30 minutes of moderate-intensity aerobic activity at least 5 days per week for a total of 150  OR   At least 25 minutes of vigorous aerobic activity at least 3 days per week for a total of 75 minutes; or a combination of moderate- and vigorous-intensity aerobic  activity  AND   Moderate- to high-intensity muscle-strengthening activity at least 2 days per week for additional health benefits.  For Lowering Blood Pressure and Cholesterol  An average 40 minutes of moderate- to vigorous-intensity aerobic activity 3 or 4 times per week  What if I can't make it to the time goal? Something is always better than nothing! And everyone has to start somewhere. Even if you've been sedentary for years, today is the day you can begin to make healthy changes in your life. If you don't think you'll make it for 30 or 40 minutes, set a reachable goal for today. You can work up toward your overall goal by increasing your time as you get stronger. Don't let all-or-nothing thinking rob you of doing what you can every day.  Source:[email or web address].[email or web address] Pop, MD [facility]

## 2017-01-30 ENCOUNTER — Ambulatory Visit: Payer: BLUE CROSS/BLUE SHIELD | Admitting: Obstetrics

## 2017-07-14 ENCOUNTER — Ambulatory Visit (HOSPITAL_COMMUNITY)
Admission: EM | Admit: 2017-07-14 | Discharge: 2017-07-14 | Disposition: A | Discharge status: Home / Self Care | Payer: Self-pay | Attending: Family Medicine | Admitting: Family Medicine

## 2017-07-14 ENCOUNTER — Encounter (HOSPITAL_COMMUNITY): Payer: Self-pay | Admitting: Emergency Medicine

## 2017-07-14 ENCOUNTER — Other Ambulatory Visit: Payer: Self-pay

## 2017-07-14 DIAGNOSIS — M62838 Other muscle spasm: Secondary | ICD-10-CM

## 2017-07-14 MED ORDER — CYCLOBENZAPRINE HCL 10 MG PO TABS: 10.0000 mg | ORAL_TABLET | Freq: Three times a day (TID) | ORAL | 0 refills | Status: DC

## 2017-07-14 NOTE — ED Triage Notes (Signed)
Pt c/o neck pain and spasms x2 weeks, worse with movement, states she slept on the cough weird or also was jumping rope and that may have casued it.

## 2017-07-16 NOTE — ED Provider Notes (Signed)
  [facility]   [id number] 07/14/17 Arrival Time: 1542  ASSESSMENT & PLAN:  1. Muscle spasms of neck     Meds ordered this encounter  Medications  . cyclobenzaprine (FLEXERIL) 10 MG tablet    Sig: Take 1 tablet (10 mg total) by mouth 3 (three) times daily.    Dispense:  20 tablet    Refill:  0   Medication sedation precautions. Will do her best to ensure adequate ROM. Will f/u if not seeing improvement over the next several days to one week.  Reviewed expectations re: course of current medical issues. Questions answered. Outlined signs and symptoms indicating need for more acute intervention. Patient verbalized understanding. After Visit Summary given.   SUBJECTIVE: History from: patient. Amy Mendoza is a 36 y.o. female who reports fairly persistent pain of her neck. Gradual onset over the past 2 weeks. Certain movements produce a "sharp pain" on sides of neck. No specific injury or trauma. No extremity sensation changes or weakness. OTC ibuprofen without much help. No h/o similar symptoms.  ROS: As per HPI.   OBJECTIVE:  Vitals:   07/14/17 1649  BP: 127/60  Pulse: 80  Resp: 16  Temp: 98.3 F (36.8 C)  SpO2: 98%    General appearance: alert; no distress Extremities: no cyanosis or edema; symmetrical with no gross deformities; FROM Neck: supple with FROM; some tenderness of musculature of neck; no midline tenderness CV: normal extremity capillary refill Skin: warm and dry Neurologic: normal gait; normal symmetric reflexes in all extremities; normal sensation in all extremities Psychological: alert and cooperative; normal mood and affect  No Known Allergies  Past Medical History:  Diagnosis Date  . Asthma   . Gallstones   . Hyperlipemia    Social History   Socioeconomic History  . Marital status: Single    Spouse name: Not on file  . Number of children: 1  . Years of education: Not on file  . Highest education level: Not on file  Social  Needs  . Financial resource strain: Not on file  . Food insecurity - worry: Not on file  . Food insecurity - inability: Not on file  . Transportation needs - medical: Not on file  . Transportation needs - non-medical: Not on file  Occupational History  . Occupation: Conservation officer, naturecashier  Tobacco Use  . Smoking status: Never Smoker  . Smokeless tobacco: Never Used  Substance and Sexual Activity  . Alcohol use: Yes    Comment: occasionally  . Drug use: No  . Sexual activity: Not on file  Other Topics Concern  . Not on file  Social History Narrative  . Not on file   Family History  Problem Relation Age of Onset  . Diabetes Father        MGM, maternal uncle  . Prostate cancer Maternal Grandfather   . Breast cancer Maternal Aunt   . Colon cancer Neg Hx    Past Surgical History:  Procedure Laterality Date  . CHOLECYSTECTOMY  feb 2006  . KNEE SURGERY     left     Amy LaymanHagler, [name], MD 07/17/17 [phone]

## 2018-04-08 ENCOUNTER — Other Ambulatory Visit: Payer: Self-pay

## 2018-04-08 ENCOUNTER — Emergency Department (HOSPITAL_COMMUNITY)
Admission: EM | Admit: 2018-04-08 | Discharge: 2018-04-09 | Disposition: A | Discharge status: Home / Self Care | Payer: Self-pay | Attending: Emergency Medicine | Admitting: Emergency Medicine

## 2018-04-08 ENCOUNTER — Encounter (HOSPITAL_COMMUNITY): Payer: Self-pay

## 2018-04-08 ENCOUNTER — Emergency Department (HOSPITAL_COMMUNITY): Payer: Self-pay

## 2018-04-08 DIAGNOSIS — M545 Low back pain, unspecified: Secondary | ICD-10-CM

## 2018-04-08 DIAGNOSIS — R1012 Left upper quadrant pain: Secondary | ICD-10-CM | POA: Insufficient documentation

## 2018-04-08 DIAGNOSIS — J45909 Unspecified asthma, uncomplicated: Secondary | ICD-10-CM | POA: Insufficient documentation

## 2018-04-08 LAB — CBC WITH DIFFERENTIAL/PLATELET
Abs Immature Granulocytes: 0 10*3/uL (ref 0.0–0.1)
BASOS ABS: 0 10*3/uL (ref 0.0–0.1)
BASOS PCT: 1 %
EOS ABS: 0.2 10*3/uL (ref 0.0–0.7)
EOS PCT: 2 %
HCT: 38.6 % (ref 36.0–46.0)
HEMOGLOBIN: 12.2 g/dL (ref 12.0–15.0)
Immature Granulocytes: 0 %
Lymphocytes Relative: 47 %
Lymphs Abs: 2.9 10*3/uL (ref 0.7–4.0)
MCH: 27.7 pg (ref 26.0–34.0)
MCHC: 31.6 g/dL (ref 30.0–36.0)
MCV: 87.5 fL (ref 78.0–100.0)
MONO ABS: 0.4 10*3/uL (ref 0.1–1.0)
Monocytes Relative: 7 %
Neutro Abs: 2.7 10*3/uL (ref 1.7–7.7)
Neutrophils Relative %: 43 %
Platelets: 289 10*3/uL (ref 150–400)
RBC: 4.41 MIL/uL (ref 3.87–5.11)
RDW: 12.3 % (ref 11.5–15.5)
WBC: 6.2 10*3/uL (ref 4.0–10.5)

## 2018-04-08 LAB — COMPREHENSIVE METABOLIC PANEL
ALBUMIN: 3.2 g/dL — AB (ref 3.5–5.0)
ALK PHOS: 46 U/L (ref 38–126)
ALT: 14 U/L (ref 0–44)
AST: 14 U/L — AB (ref 15–41)
Anion gap: 10 (ref 5–15)
BILIRUBIN TOTAL: 0.4 mg/dL (ref 0.3–1.2)
BUN: 5 mg/dL — AB (ref 6–20)
CALCIUM: 8.7 mg/dL — AB (ref 8.9–10.3)
CO2: 21 mmol/L — ABNORMAL LOW (ref 22–32)
CREATININE: 0.8 mg/dL (ref 0.44–1.00)
Chloride: 106 mmol/L (ref 98–111)
GFR calc Af Amer: >60 mL/min (ref >60)
GLUCOSE: 111 mg/dL — AB (ref 70–99)
Potassium: 3.9 mmol/L (ref 3.5–5.1)
Sodium: 137 mmol/L (ref 135–145)
Total Protein: 6.9 g/dL (ref 6.5–8.1)

## 2018-04-08 NOTE — ED Triage Notes (Signed)
Pt here for increased shortness of breath and abdominal pain after experiencing a back injury last week.  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 Heat and Ice have not helped.

## 2018-04-09 ENCOUNTER — Emergency Department (HOSPITAL_COMMUNITY): Payer: Self-pay

## 2018-04-09 LAB — URINALYSIS, ROUTINE W REFLEX MICROSCOPIC
Bilirubin Urine: NEGATIVE
Glucose, UA: NEGATIVE mg/dL
HGB URINE DIPSTICK: NEGATIVE
KETONES UR: NEGATIVE mg/dL
Leukocytes, UA: NEGATIVE
Nitrite: NEGATIVE
PROTEIN: NEGATIVE mg/dL
Specific Gravity, Urine: 1.028 (ref 1.005–1.030)
pH: 5 (ref 5.0–8.0)

## 2018-04-09 LAB — POC URINE PREG, ED: Preg Test, Ur: NEGATIVE

## 2018-04-09 MED ORDER — PREDNISONE 10 MG (21) PO TBPK: ORAL_TABLET | Freq: Every day | ORAL | 0 refills | Status: DC

## 2018-04-09 MED ORDER — KETOROLAC TROMETHAMINE 30 MG/ML IJ SOLN
30.0000 mg | Freq: Once | INTRAMUSCULAR | Status: AC
  Administered 2018-04-09: 30 mg via INTRAMUSCULAR
  Filled 2018-04-09: qty 1

## 2018-04-09 MED ORDER — CYCLOBENZAPRINE HCL 10 MG PO TABS: 10.0000 mg | ORAL_TABLET | Freq: Two times a day (BID) | ORAL | 0 refills | Status: DC | PRN

## 2018-04-09 MED ORDER — KETOROLAC TROMETHAMINE 60 MG/2ML IM SOLN: 60.0000 mg | Freq: Once | INTRAMUSCULAR | Status: DC

## 2018-04-09 MED ORDER — OXYCODONE-ACETAMINOPHEN 5-325 MG PO TABS
1.0000 | ORAL_TABLET | ORAL | Status: DC | PRN
  Administered 2018-04-09: 1 via ORAL
  Filled 2018-04-09: qty 1

## 2018-04-09 NOTE — ED Provider Notes (Addendum)
[facility] EMERGENCY DEPARTMENT Provider Note   CSN: [id number] Arrival date & time: 04/08/18  2142     History   Chief Complaint Chief Complaint  Patient presents with  . Back Injury  . Shortness of Breath  . Abdominal Pain    HPI Amy Mendoza is a 37 y.o. female with history of asthma, gallstones, hyperlipidemia presents for evaluation of acute onset, progressively worsening back pain for 4 days and abdominal pain yesterday.  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  She states this is intermittent and will occasionally "spasm" which will "take my breath away".  Pain is elicited with bending and twisting motions, improves with rest and application of topical cocaine cream.  Yesterday she began to feel severe left upper quadrant abdominal pain which will radiate across the upper abdomen.  Denies urinary symptoms, diarrhea, fevers, chills, or chest pain.  She notes chronic constipation, last bowel movement was 3 days ago and was normal for her.  She has tried hot showers and heating pads without significant relief.  Denies radiation of symptoms down the lower extremities.  She denies bowel or bladder incontinence, saddle anesthesia, or IV drug use.   The history is provided by the patient.    Past Medical History:  Diagnosis Date  . Asthma   . Gallstones   . Hyperlipemia     Patient Active Problem List   Diagnosis Date Noted  . Routine general medical examination at a health care facility 01/18/2017    Past Surgical History:  Procedure Laterality Date  . CHOLECYSTECTOMY  feb 2006  . KNEE SURGERY     left     OB History   None      Home Medications    Prior to Admission medications   Medication Sig Start Date End Date Taking? Authorizing Provider  norgestimate-ethinyl estradiol (SPRINTEC 28) 0.25-35 MG-MCG tablet Take 1 tablet by mouth daily.   Yes [provider]  cyclobenzaprine (FLEXERIL) 10 MG tablet Take 1 tablet (10 mg total) by mouth 2 (two) times daily as needed for muscle spasms. 04/09/18   Aamir Mclinden A, PA-C  predniSONE (STERAPRED UNI-PAK 21 TAB) 10 MG (21) TBPK tablet Take by mouth daily. Take 6 tabs by mouth daily  for 2 days, then 5 tabs for 2 days, then 4 tabs for 2 days, then 3 tabs for 2 days, 2 tabs for 2 days, then 1 tab by mouth daily for 2 days 04/09/18   Jeanie Sewer, PA-C    Family History Family History  Problem Relation Age of Onset  . Diabetes Father        MGM, maternal uncle  . Prostate cancer Maternal Grandfather   . Breast cancer Maternal Aunt   . Colon cancer Neg Hx     Social History Social History   Tobacco Use  . Smoking status: Never Smoker  . Smokeless tobacco: Never Used  Substance Use Topics  . Alcohol use: Yes    Comment: occasionally  . Drug use: No     Allergies   Patient has no known allergies.   Review of Systems Review of Systems  Constitutional: Negative for chills and fever.  Respiratory: Negative for shortness of breath.   Cardiovascular: Negative for chest pain.  Gastrointestinal: Positive for abdominal pain and constipation. Negative for diarrhea, nausea and vomiting.  Genitourinary: Negative for dysuria, frequency, hematuria and  urgency.  Musculoskeletal: Positive for back pain.  Neurological: Negative for weakness and numbness.  All other systems reviewed and are negative.    Physical Exam Updated Vital Signs BP 115/68 (BP Location: Right Arm)   Pulse 82   Temp 97.9 F (36.6 C) (Oral)   Resp 16   SpO2 100%   Physical Exam  Constitutional: She appears well-developed and well-nourished. No distress.  HENT:  Head: Normocephalic and atraumatic.  Eyes: Conjunctivae are normal. Right eye exhibits no discharge. Left eye exhibits no discharge.  Neck: Normal range of motion. Neck supple. No JVD present. No tracheal deviation present.  Cardiovascular: Normal  rate, regular rhythm and normal heart sounds.  Pulmonary/Chest: Effort normal. No accessory muscle usage. No tachypnea. No respiratory distress.  Abdominal: Soft. She exhibits no distension. Bowel sounds are decreased. There is tenderness in the left upper quadrant. There is CVA tenderness. There is no rigidity, no rebound, no guarding, no tenderness at McBurney's point and negative Murphy's sign.  Left sided CVA tenderness present.   Musculoskeletal: She exhibits no edema.  No midline lumbar spine tenderness noted, left upper paralumbar muscle tenderness noted with no deformity, crepitus, or step-off noted.  5/5 strength of BLE major muscle groups.  Neurological: She is alert.  Fluent speech with no evidence of dysarthria or aphasia, no facial droop, sensation intact to soft touch of bilateral lower extremities.  Ambulates with an antalgic gait, able to Heel Walk and Toe Walk without difficulty.  Skin: No erythema.  Psychiatric: She has a normal mood and affect. Her behavior is normal.  Nursing note and vitals reviewed.    ED Treatments / Results  Labs (all labs ordered are listed, but only abnormal results are displayed) Labs Reviewed  COMPREHENSIVE METABOLIC PANEL - Abnormal; Notable for the following components:      Result Value   CO2 21 (*)    Glucose, Bld 111 (*)    BUN 5 (*)    Calcium 8.7 (*)    Albumin 3.2 (*)    AST 14 (*)    All other components within normal limits  URINALYSIS, ROUTINE W REFLEX MICROSCOPIC - Abnormal; Notable for the following components:   APPearance HAZY (*)    All other components within normal limits  CBC WITH DIFFERENTIAL/PLATELET  POC URINE PREG, ED    EKG EKG Interpretation  Date/Time:  Monday April 08 2018 21:51:00 EDT Ventricular Rate:  88 PR Interval:  150 QRS Duration: 68 QT Interval:  360 QTC Calculation: 435 R Axis:   70 Text Interpretation:  Normal sinus rhythm Nonspecific T wave abnormality Abnormal ECG When compared with  ECG of 09/04/2014, No significant change was found Confirmed by Dione Booze ([id number]) on 04/08/2018 11:04:24 PM Also confirmed by Dione Booze ([id number]), editor Barbette Hair [phone])  on 04/09/2018 7:07:42 AM   Radiology Dg Chest 2 View  Result Date: 04/08/2018 CLINICAL DATA:  Dyspnea EXAM: CHEST - 2 VIEW COMPARISON:  04/08/2014 chest radiograph. FINDINGS: Stable cardiomediastinal silhouette with normal heart size. No pneumothorax. No pleural effusion. Lungs appear clear, with no acute consolidative airspace disease and no pulmonary edema. Cholecystectomy clips are seen in the right upper quadrant of the abdomen. IMPRESSION: No active cardiopulmonary disease. Electronically Signed   By: Delbert Phenix M.D.   On: 04/08/2018 22:31   Ct Renal Stone Study  Result Date: 04/09/2018 CLINICAL DATA:  37 year old female with a history of left flank pain for 2 days EXAM: CT ABDOMEN AND PELVIS WITHOUT CONTRAST TECHNIQUE: Multidetector CT  imaging of the abdomen and pelvis was performed following the standard protocol without IV contrast. COMPARISON:  12/05/2015 FINDINGS: Lower chest: Respiratory motion somewhat limits evaluation. Scarring/atelectasis at the lung base. Hepatobiliary: Unremarkable liver parenchyma.  Cholecystectomy. Pancreas: Unremarkable pancreas Spleen: Unremarkable spleen Adrenals/Urinary Tract: Unremarkable appearance of the adrenal glands. No evidence of hydronephrosis of the right or left kidney. No nephrolithiasis. Unremarkable course of the bilateral ureters. Unremarkable appearance of the urinary bladder. Stomach/Bowel: Unremarkable stomach. Unremarkable small bowel. Normal appendix. Moderate stool burden. Diverticular change without associated inflammation. No inflammatory changes related to small bowel or colon. Vascular/Lymphatic: No significant atherosclerotic changes. No lymphadenopathy. Reproductive: Unremarkable pelvic structures. Other: Small fat containing umbilical hernia. Musculoskeletal: No  acute displaced fracture. IMPRESSION: No acute CT finding. Negative for nephrolithiasis. Small fat containing umbilical hernia. Diverticular change without evidence of acute diverticulitis. Electronically Signed   By: Gilmer MorJaime  Wagner D.O.   On: 04/09/2018 08:21    Procedures Procedures (including critical care time)  Medications Ordered in ED Medications  oxyCODONE-acetaminophen (PERCOCET/ROXICET) 5-325 MG per tablet 1 tablet (1 tablet Oral Given 04/09/18 0113)  ketorolac (TORADOL) 30 MG/ML injection 30 mg (30 mg Intramuscular Given 04/09/18 [id number])     Initial Impression / Assessment and Plan / ED Course  I have reviewed the triage vital signs and the nursing notes.  Pertinent labs & imaging results that were available during my care of the patient were reviewed by me and considered in my medical decision making (see chart for details).     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  Progressively worsened and then developed left upper quadrant pain.  No peritoneal signs on examination of the abdomen.  She is afebrile, vital signs are stable.  She is nontoxic in appearance.  Lab work reviewed by me shows no leukocytosis, no anemia.  No metabolic derangements.  Creatinine and LFTs are not elevated.  Chest x-ray shows no acute cardiopulmonary abnormalities.  No epigastric tenderness to suggest pancreatitis.  UA shows no hematuria.  Discussed with patient who would like to proceed with CT renal stone study as she states pain seems consistent with prior kidney stones she has had in the past.  Renal stone study shows no acute findings, no evidence of nephrolithiasis or renal abscess. Doubt acute surgical abdominal pathology. Doubt PID, ovarian torsion, TOA, or ectopic pregnancy in the absence of lower abdominal pain. Suspect lumbar strain after heavy lifting.  No red flag signs concerning for cauda equina or spinal abscess.  She is  ambulatory without difficulty despite pain.  Recommend conservative management with prednisone taper, muscle relaxers as needed.  Discussed appropriate use of these medications.  Recommend follow-up with PCP for reevaluation.  Discussed strict ED return precautions. Pt verbalized understanding of and agreement with plan and is safe for discharge home at this time.   Final Clinical Impressions(s) / ED Diagnoses   Final diagnoses:  Acute left-sided low back pain without sciatica  Left upper quadrant pain    ED Discharge Orders         Ordered    predniSONE (STERAPRED UNI-PAK 21 TAB) 10 MG (21) TBPK tablet  Daily     04/09/18 0844    cyclobenzaprine (FLEXERIL) 10 MG tablet  2 times daily PRN     04/09/18 0844           Jeanie SewerFawze, Shadaya Marschner A, PA-C 04/09/18 0901    Devoria AlbeKnapp, Iva, MD 04/09/18 2336

## 2018-04-09 NOTE — ED Notes (Signed)
D/c reviewed with patient 

## 2018-04-09 NOTE — Discharge Instructions (Signed)
1. Medications: Take steroid taper as prescribed.  Do not take ibuprofen, Advil, Aleve, or Motrin while taking this medication.  Take steroid with food to avoid upset stomach issues.  You can also take [phone] mg of Tylenol every 6 hours as needed for pain. Do not exceed 4000 mg of Tylenol daily. You can take Flexeril as needed for muscle spasm up to twice daily but do not drive, drink alcohol, or operate heavy machinery while taking this medicine because it may make you drowsy.  I typically recommend taking this medicine only at night when you are going to sleep.  You can also cut these tablets in half if they make you feel very drowsy. 2. Treatment: rest, drink plenty of fluids, gentle stretching as discussed (see attached, or can also Youtube search low back physical therapy exercises), alternate ice and heat (or stick with whichever feels best) 20 minutes on 20 minutes off. 3. Follow Up: Please followup with your primary doctor in 3-7 days for discussion of your diagnoses and further evaluation after today's visit; if you do not have a primary care doctor use the resource guide provided to find one;  Return to the ER for worsening back pain, difficulty walking, loss of bowel or bladder control or other concerning symptoms

## 2018-07-01 ENCOUNTER — Encounter (HOSPITAL_COMMUNITY): Payer: Self-pay | Admitting: Emergency Medicine

## 2018-07-01 ENCOUNTER — Ambulatory Visit (HOSPITAL_COMMUNITY)
Admission: EM | Admit: 2018-07-01 | Discharge: 2018-07-01 | Disposition: A | Discharge status: Home / Self Care | Payer: Medicaid Other | Attending: Family Medicine | Admitting: Family Medicine

## 2018-07-01 DIAGNOSIS — J069 Acute upper respiratory infection, unspecified: Secondary | ICD-10-CM

## 2018-07-01 DIAGNOSIS — J029 Acute pharyngitis, unspecified: Secondary | ICD-10-CM

## 2018-07-01 DIAGNOSIS — J028 Acute pharyngitis due to other specified organisms: Secondary | ICD-10-CM | POA: Insufficient documentation

## 2018-07-01 LAB — POCT RAPID STREP A: Streptococcus, Group A Screen (Direct): NEGATIVE

## 2018-07-01 MED ORDER — FLUTICASONE PROPIONATE 50 MCG/ACT NA SUSP: 1.0000 | Freq: Every day | NASAL | 2 refills | Status: DC

## 2018-07-01 MED ORDER — BENZONATATE 100 MG PO CAPS: 100.0000 mg | ORAL_CAPSULE | Freq: Three times a day (TID) | ORAL | 0 refills | Status: DC

## 2018-07-01 NOTE — ED Triage Notes (Signed)
Pt c/o sore throat losing her voice since Friday.

## 2018-07-01 NOTE — ED Provider Notes (Signed)
[facility]    CSN: [id number] Arrival date & time: 07/01/18  1051     History   Chief Complaint Chief Complaint  Patient presents with  . Sore Throat    HPI Amy Mendoza is a 37 y.o. female who presents to the ED with URI symptoms. The symptoms started a few days ago with sore throat, fever and cough. The symptoms have improved slightly but patient continues to feel bad. Patient reports her son has been sick but he was fine after having breathing treatment. Patient reports 2 days ago she lost her voice but now it has come back some.   The history is provided by the patient. No language interpreter was used.  Sore Throat  This is a new problem. The current episode started 2 days ago. The problem occurs constantly. Associated symptoms include headaches. Pertinent negatives include no chest pain, no abdominal pain and no shortness of breath. Nothing relieves the symptoms. She has tried acetaminophen and rest for the symptoms.    Past Medical History:  Diagnosis Date  . Asthma   . Gallstones   . Hyperlipemia     Patient Active Problem List   Diagnosis Date Noted  . Routine general medical examination at a health care facility 01/18/2017    Past Surgical History:  Procedure Laterality Date  . CHOLECYSTECTOMY  feb 2006  . KNEE SURGERY     left    OB History   No obstetric history on file.      Home Medications    Prior to Admission medications   Medication Sig Start Date End Date Taking? Authorizing Provider  benzonatate (TESSALON) 100 MG capsule Take 1 capsule (100 mg total) by mouth every 8 (eight) hours. 07/01/18   Janne Napoleon, NP  fluticasone (FLONASE) 50 MCG/ACT nasal spray Place 1 spray into both nostrils daily. 07/01/18   Janne Napoleon, NP  norgestimate-ethinyl estradiol (SPRINTEC 28) 0.25-35 MG-MCG tablet Take 1 tablet by mouth daily.    [provider]    Family History Family History  Problem Relation Age of Onset  .  Diabetes Father        MGM, maternal uncle  . Prostate cancer Maternal Grandfather   . Breast cancer Maternal Aunt   . Colon cancer Neg Hx     Social History Social History   Tobacco Use  . Smoking status: Never Smoker  . Smokeless tobacco: Never Used  Substance Use Topics  . Alcohol use: Yes    Comment: occasionally  . Drug use: No     Allergies   Patient has no known allergies.   Review of Systems Review of Systems  Constitutional: Positive for chills and fever.  HENT: Positive for congestion, sore throat and voice change. Negative for ear pain, nosebleeds and trouble swallowing.   Eyes: Negative for pain, discharge, redness and itching.  Respiratory: Positive for cough. Negative for shortness of breath and wheezing.   Cardiovascular: Negative for chest pain.  Gastrointestinal: Negative for abdominal pain, diarrhea, nausea and vomiting.  Genitourinary: Negative for dysuria, frequency and urgency.  Musculoskeletal: Positive for myalgias. Negative for back pain.  Skin: Negative for rash.  Neurological: Positive for headaches. Negative for syncope.  Hematological: Negative for adenopathy.  Psychiatric/Behavioral: Negative for confusion.     Physical Exam Triage Vital Signs ED Triage Vitals [07/01/18 1138]  Enc Vitals Group     BP 129/73     Pulse Rate 89     Resp 18  Temp 98.2 F (36.8 C)     Temp src      SpO2 100 %     Weight      Height      Head Circumference      Peak Flow      Pain Score 0     Pain Loc      Pain Edu?      Excl. in GC?    No data found.  Updated Vital Signs BP 129/73   Pulse 89   Temp 98.2 F (36.8 C)   Resp 18   LMP 06/10/2018   SpO2 100%   Visual Acuity Right Eye Distance:   Left Eye Distance:   Bilateral Distance:    Right Eye Near:   Left Eye Near:    Bilateral Near:     Physical Exam Vitals signs and nursing note reviewed.  Constitutional:      Appearance: She is well-developed.  HENT:     Head:  Normocephalic and atraumatic.     Right Ear: Tympanic membrane normal.     Left Ear: Tympanic membrane normal.     Nose: Congestion present.     Mouth/Throat:     Mouth: Mucous membranes are moist.     Pharynx: Posterior oropharyngeal erythema present.  Eyes:     Conjunctiva/sclera: Conjunctivae normal.  Neck:     Musculoskeletal: Normal range of motion and neck supple.  Cardiovascular:     Rate and Rhythm: Normal rate and regular rhythm.  Pulmonary:     Effort: Pulmonary effort is normal. No respiratory distress.     Breath sounds: No wheezing or rales.  Abdominal:     Palpations: Abdomen is soft.     Tenderness: There is no abdominal tenderness.  Musculoskeletal: Normal range of motion.  Lymphadenopathy:     Cervical: No cervical adenopathy.  Skin:    General: Skin is warm and dry.  Neurological:     Mental Status: She is alert and oriented to person, place, and time.  Psychiatric:        Mood and Affect: Mood normal.      UC Treatments / Results  Labs (all labs ordered are listed, but only abnormal results are displayed) Labs Reviewed  CULTURE, GROUP A STREP [facility])  POCT RAPID STREP A   Radiology No results found.  Procedures Procedures (including critical care time)  Medications Ordered in UC Medications - No data to display  Initial Impression / Assessment and Plan / UC Course  I have reviewed the triage vital signs and the nursing notes.  Patients symptoms are consistent with URI, likely viral etiology that have improved since they started a few days ago.  Discussed that antibiotics are not indicated for viral infections. Pt will be discharged with symptomatic treatment.  Verbalizes understanding and is agreeable with plan. Pt is hemodynamically stable & in NAD prior to dc. No concern for tonsillar abscess or meningitis.  Final Clinical Impressions(s) / UC Diagnoses   Final diagnoses:  Viral pharyngitis  Acute upper respiratory infection   Discharge  Instructions   None    ED Prescriptions    Medication Sig Dispense Auth. Provider   benzonatate (TESSALON) 100 MG capsule Take 1 capsule (100 mg total) by mouth every 8 (eight) hours. 21 capsule [place], AspinwallHope M, NP   fluticasone (FLONASE) 50 MCG/ACT nasal spray Place 1 spray into both nostrils daily. 16 g Janne NapoleonNeese, Juliannah Ohmann M, NP     Controlled Substance Prescriptions [place] Controlled  Substance Registry consulted? Not Applicable   Janne Napoleon, [place] 07/01/18 1215

## 2018-07-03 LAB — CULTURE, GROUP A STREP (THRC)

## 2018-11-09 ENCOUNTER — Encounter (HOSPITAL_COMMUNITY): Payer: Self-pay

## 2018-11-09 ENCOUNTER — Ambulatory Visit (HOSPITAL_COMMUNITY)
Admission: EM | Admit: 2018-11-09 | Discharge: 2018-11-09 | Disposition: A | Discharge status: Home / Self Care | Payer: Medicaid Other | Attending: Family Medicine | Admitting: Family Medicine

## 2018-11-09 ENCOUNTER — Other Ambulatory Visit: Payer: Self-pay

## 2018-11-09 ENCOUNTER — Encounter (HOSPITAL_COMMUNITY): Payer: Self-pay | Admitting: Physician Assistant

## 2018-11-09 ENCOUNTER — Emergency Department (HOSPITAL_COMMUNITY)
Admission: EM | Admit: 2018-11-09 | Discharge: 2018-11-09 | Disposition: A | Discharge status: Home / Self Care | Payer: BLUE CROSS/BLUE SHIELD | Attending: Emergency Medicine | Admitting: Emergency Medicine

## 2018-11-09 DIAGNOSIS — F19920 Other psychoactive substance use, unspecified with intoxication, uncomplicated: Secondary | ICD-10-CM | POA: Insufficient documentation

## 2018-11-09 DIAGNOSIS — F419 Anxiety disorder, unspecified: Secondary | ICD-10-CM | POA: Diagnosis present

## 2018-11-09 DIAGNOSIS — R000 Tachycardia, unspecified: Secondary | ICD-10-CM | POA: Diagnosis not present

## 2018-11-09 DIAGNOSIS — F19980 Other psychoactive substance use, unspecified with psychoactive substance-induced anxiety disorder: Secondary | ICD-10-CM | POA: Diagnosis not present

## 2018-11-09 DIAGNOSIS — E876 Hypokalemia: Secondary | ICD-10-CM | POA: Insufficient documentation

## 2018-11-09 DIAGNOSIS — N3090 Cystitis, unspecified without hematuria: Secondary | ICD-10-CM | POA: Insufficient documentation

## 2018-11-09 LAB — POCT URINALYSIS DIP (DEVICE)
Bilirubin Urine: NEGATIVE
Glucose, UA: NEGATIVE mg/dL
Ketones, ur: NEGATIVE mg/dL
Nitrite: NEGATIVE
Protein, ur: NEGATIVE mg/dL
Specific Gravity, Urine: 1.015 (ref 1.005–1.030)
Urobilinogen, UA: 0.2 mg/dL (ref 0.0–1.0)
pH: 6.5 (ref 5.0–8.0)

## 2018-11-09 LAB — CBC WITH DIFFERENTIAL/PLATELET
Abs Immature Granulocytes: 0.03 10*3/uL (ref 0.00–0.07)
Basophils Absolute: 0 10*3/uL (ref 0.0–0.1)
Basophils Relative: 0 %
Eosinophils Absolute: 0.1 10*3/uL (ref 0.0–0.5)
Eosinophils Relative: 1 %
HCT: 39.7 % (ref 36.0–46.0)
Hemoglobin: 12.7 g/dL (ref 12.0–15.0)
Immature Granulocytes: 0 %
Lymphocytes Relative: 37 %
Lymphs Abs: 3.4 10*3/uL (ref 0.7–4.0)
MCH: 28.2 pg (ref 26.0–34.0)
MCHC: 32 g/dL (ref 30.0–36.0)
MCV: 88 fL (ref 80.0–100.0)
Monocytes Absolute: 0.8 10*3/uL (ref 0.1–1.0)
Monocytes Relative: 8 %
Neutro Abs: 4.7 10*3/uL (ref 1.7–7.7)
Neutrophils Relative %: 54 %
Platelets: 325 10*3/uL (ref 150–400)
RBC: 4.51 MIL/uL (ref 3.87–5.11)
RDW: 12.9 % (ref 11.5–15.5)
WBC: 9 10*3/uL (ref 4.0–10.5)
nRBC: 0 % (ref 0.0–0.2)

## 2018-11-09 LAB — COMPREHENSIVE METABOLIC PANEL
ALT: 17 U/L (ref 0–44)
AST: 14 U/L — ABNORMAL LOW (ref 15–41)
Albumin: 3.8 g/dL (ref 3.5–5.0)
Alkaline Phosphatase: 49 U/L (ref 38–126)
Anion gap: 11 (ref 5–15)
BUN: 14 mg/dL (ref 6–20)
CO2: 25 mmol/L (ref 22–32)
Calcium: 9 mg/dL (ref 8.9–10.3)
Chloride: 101 mmol/L (ref 98–111)
Creatinine, Ser: 1.02 mg/dL — ABNORMAL HIGH (ref 0.44–1.00)
GFR calc Af Amer: >60 mL/min (ref >60)
GFR calc non Af Amer: >60 mL/min (ref >60)
Glucose, Bld: 137 mg/dL — ABNORMAL HIGH (ref 70–99)
Potassium: 2.9 mmol/L — ABNORMAL LOW (ref 3.5–5.1)
Sodium: 137 mmol/L (ref 135–145)
Total Bilirubin: 0.3 mg/dL (ref 0.3–1.2)
Total Protein: 8.1 g/dL (ref 6.5–8.1)

## 2018-11-09 LAB — TROPONIN I: Troponin I: <0.03 ng/mL (ref <0.03)

## 2018-11-09 LAB — I-STAT BETA HCG BLOOD, ED (MC, WL, AP ONLY): I-stat hCG, quantitative: <5 m[IU]/mL (ref <5)

## 2018-11-09 MED ORDER — SODIUM CHLORIDE 0.9 % IV BOLUS
1000.0000 mL | Freq: Once | INTRAVENOUS | Status: AC
  Administered 2018-11-09: 1000 mL via INTRAVENOUS

## 2018-11-09 MED ORDER — CEPHALEXIN 500 MG PO CAPS: 500.0000 mg | ORAL_CAPSULE | Freq: Two times a day (BID) | ORAL | 0 refills | Status: AC

## 2018-11-09 MED ORDER — LORAZEPAM 2 MG/ML IJ SOLN
0.5000 mg | Freq: Once | INTRAMUSCULAR | Status: AC
  Administered 2018-11-09: 0.5 mg via INTRAVENOUS
  Filled 2018-11-09: qty 1

## 2018-11-09 MED ORDER — POTASSIUM CHLORIDE 10 MEQ/100ML IV SOLN
10.0000 meq | Freq: Once | INTRAVENOUS | Status: AC
  Administered 2018-11-09: 10 meq via INTRAVENOUS
  Filled 2018-11-09: qty 100

## 2018-11-09 MED ORDER — POTASSIUM CHLORIDE CRYS ER 20 MEQ PO TBCR
40.0000 meq | EXTENDED_RELEASE_TABLET | Freq: Once | ORAL | Status: AC
  Administered 2018-11-09: 21:00:00 40 meq via ORAL
  Filled 2018-11-09: qty 2

## 2018-11-09 MED ORDER — FLUCONAZOLE 150 MG PO TABS: 150.0000 mg | ORAL_TABLET | Freq: Every day | ORAL | 0 refills | Status: DC

## 2018-11-09 MED ORDER — POTASSIUM CHLORIDE CRYS ER 20 MEQ PO TBCR: 20.0000 meq | EXTENDED_RELEASE_TABLET | Freq: Two times a day (BID) | ORAL | 0 refills | Status: DC

## 2018-11-09 NOTE — ED Notes (Signed)
Bed: WA09 Expected date:  Expected time:  Means of arrival:  Comments: Anxiety

## 2018-11-09 NOTE — ED Provider Notes (Signed)
[facility]-EMERGENCY DEPT Provider Note   CSN: [id number] Arrival date & time: 11/09/18  1929    History   Chief Complaint Chief Complaint  Patient presents with  . Anxiety    HPI Amy Mendoza is a 38 y.o. female.     HPI   Was seen earlier for UTI at urgent care First time having edibles was 530PM today Reports was only marijuana Had 2 cookies Reports chest and body feel weird No other drugs, no caffeine or other alcohol Suddenly at [facility]7PM felt like everything going numb over body, felt anxious, mouth feels numb as talking Has never felt this way before Has not started or stopped other medications   Past Medical History:  Diagnosis Date  . Asthma   . Gallstones   . Hyperlipemia     Patient Active Problem List   Diagnosis Date Noted  . Routine general medical examination at a health care facility 01/18/2017    Past Surgical History:  Procedure Laterality Date  . CHOLECYSTECTOMY  feb 2006  . KNEE SURGERY     left     OB History   No obstetric history on file.      Home Medications    Prior to Admission medications   Medication Sig Start Date End Date Taking? Authorizing Provider  benzonatate (TESSALON) 100 MG capsule Take 1 capsule (100 mg total) by mouth every 8 (eight) hours. Patient not taking: Reported on 11/09/2018 07/01/18   Janne NapoleonNeese, Hope M, NP  cephALEXin (KEFLEX) 500 MG capsule Take 1 capsule (500 mg total) by mouth 2 (two) times daily for 5 days. 11/09/18 11/14/18  Cathie HoopsYu, Amy V, PA-C  fluconazole (DIFLUCAN) 150 MG tablet Take 1 tablet (150 mg total) by mouth daily. Take second dose 72 hours later if symptoms still persists. 11/09/18   Cathie HoopsYu, Amy V, PA-C  fluticasone (FLONASE) 50 MCG/ACT nasal spray Place 1 spray into both nostrils daily. Patient not taking: Reported on 11/09/2018 07/01/18   Janne NapoleonNeese, Hope M, NP  potassium chloride SA (K-DUR) 20 MEQ tablet Take 1 tablet (20 mEq total) by mouth 2 (two) times daily for 2 days. 11/09/18  11/11/18  Alvira MondaySchlossman, August Gosser, MD    Family History Family History  Problem Relation Age of Onset  . Diabetes Father        MGM, maternal uncle  . Prostate cancer Maternal Grandfather   . Breast cancer Maternal Aunt   . Colon cancer Neg Hx     Social History Social History   Tobacco Use  . Smoking status: Never Smoker  . Smokeless tobacco: Never Used  Substance Use Topics  . Alcohol use: Yes    Comment: occasionally  . Drug use: Yes    Types: Marijuana     Allergies   Patient has no known allergies.   Review of Systems Review of Systems  Constitutional: Negative for fever.  Respiratory: Positive for shortness of breath.   Cardiovascular: Positive for chest pain and palpitations.  Gastrointestinal: Negative for abdominal pain, diarrhea, nausea and vomiting.  Genitourinary: Positive for dysuria and frequency.  Musculoskeletal: Negative for back pain.  Neurological: Positive for numbness (all over).  Psychiatric/Behavioral: Negative for suicidal ideas.     Physical Exam Updated Vital Signs BP 129/88   Pulse 88   Temp 98.4 F (36.9 C) (Axillary)   Resp 20   Ht 5\' 8"  (1.727 m)   Wt 117.9 kg   LMP 10/26/2018   SpO2 100%   BMI 39.53 kg/m  Physical Exam Vitals signs and nursing note reviewed.  Constitutional:      General: She is not in acute distress.    Appearance: She is well-developed. She is not diaphoretic.  HENT:     Head: Normocephalic and atraumatic.  Eyes:     Conjunctiva/sclera: Conjunctivae normal.  Neck:     Musculoskeletal: Normal range of motion.  Cardiovascular:     Rate and Rhythm: Normal rate and regular rhythm.     Heart sounds: Normal heart sounds. No murmur. No friction rub. No gallop.   Pulmonary:     Effort: Pulmonary effort is normal. No respiratory distress.     Breath sounds: Normal breath sounds. No wheezing or rales.  Abdominal:     General: There is no distension.     Palpations: Abdomen is soft.     Tenderness: There is  no abdominal tenderness. There is no guarding.  Musculoskeletal:        General: No tenderness.  Skin:    General: Skin is warm and dry.     Findings: No erythema or rash.  Neurological:     Mental Status: She is alert and oriented to person, place, and time.      ED Treatments / Results  Labs (all labs ordered are listed, but only abnormal results are displayed) Labs Reviewed  COMPREHENSIVE METABOLIC PANEL - Abnormal; Notable for the following components:      Result Value   Potassium 2.9 (*)    Glucose, Bld 137 (*)    Creatinine, Ser 1.02 (*)    AST 14 (*)    All other components within normal limits  CBC WITH DIFFERENTIAL/PLATELET  TROPONIN I  I-STAT BETA HCG BLOOD, ED ([facility], [facility], [facility] ONLY)    EKG EKG Interpretation  Date/Time:  Saturday November 09 2018 19:40:01 EDT Ventricular Rate:  137 PR Interval:    QRS Duration: 79 QT Interval:  332 QTC Calculation: 502 R Axis:   58 Text Interpretation:  Sinus tachycardia , less likely ectopic atrial rhythm Probable left atrial enlargement Probable anteroseptal infarct, old Nonspecific T abnormalities, inferior leads Prolonged QT in comparison to prior , rate has increased in comparison to prior, more exaggerated ECG changes in inferior  leads in comparison to prior.  Confirmed by Alvira Monday ([id number]) on 11/09/2018 8:20:37 PM   Radiology No results found.  Procedures Procedures (including critical care time)  Medications Ordered in ED Medications  potassium chloride SA (K-DUR) CR tablet 40 mEq (has no administration in time range)  potassium chloride 10 mEq in 100 mL IVPB (has no administration in time range)  sodium chloride 0.9 % bolus 1,000 mL (1,000 mLs Intravenous New Bag/Given 11/09/18 2005)  LORazepam (ATIVAN) injection 0.5 mg (0.5 mg Intravenous Given 11/09/18 2005)     Initial Impression / Assessment and Plan / ED Course  I have reviewed the triage vital signs and the nursing notes.  Pertinent labs & imaging  results that were available during my care of the patient were reviewed by me and considered in my medical decision making (see chart for details).        38yo female who was seen earlier today for urinary symptoms presents after having marijuana cookies at 530PM with severe anxiety and full body numbness.  She is tachycardic to 140 on arrival to ED, and given possibility of other drug use and report of CP, ordered EKG/troponin for further evaluation for ischemia. Doubt PE in setting of anxiety and drug use as likely etiology  of chest pain and dyspnea.    EKG with sinus tachycardia, prolonged QTc.  Labs show hypokalemia, no other significant abnormalities.    Given IV fluids, ativan and observed with decrease in HR. Given IV K, oral K and discharged with same.  Is hemodynamically stable, will go home with support.   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

## 2018-11-09 NOTE — ED Notes (Signed)
Discharge instructions discussed with patient. Patient verbalized understanding and has no questions at this time.

## 2018-11-09 NOTE — ED Triage Notes (Signed)
Per pt she has been having urinary frequency but nothing really coming out and then it hurts afterward. No fever no chills, no abdominal pain. No blood ion urine

## 2018-11-09 NOTE — ED Provider Notes (Signed)
[facility]    CSN: [id number] Arrival date & time: 11/09/18  1526     History   Chief Complaint Chief Complaint  Patient presents with  . Urinary Frequency    HPI Amy Mendoza is a 38 y.o. female.   38 year old female comes in for few day history of urinary frequency, hesitancy, dysuria.  Denies abdominal pain, nausea, vomiting.  Denies fever, chills, night sweats.  Denies vaginal discharge, itching, pain.  Denies hematuria.  Denies back pain/flank pain.  She has had a history of kidney stones in the past.  Has not taken anything for symptoms.  LMP 10/26/2018.     Past Medical History:  Diagnosis Date  . Asthma   . Gallstones   . Hyperlipemia     Patient Active Problem List   Diagnosis Date Noted  . Routine general medical examination at a health care facility 01/18/2017    Past Surgical History:  Procedure Laterality Date  . CHOLECYSTECTOMY  feb 2006  . KNEE SURGERY     left    OB History   No obstetric history on file.      Home Medications    Prior to Admission medications   Medication Sig Start Date End Date Taking? Authorizing Provider  benzonatate (TESSALON) 100 MG capsule Take 1 capsule (100 mg total) by mouth every 8 (eight) hours. 07/01/18   Janne NapoleonNeese, Hope M, NP  cephALEXin (KEFLEX) 500 MG capsule Take 1 capsule (500 mg total) by mouth 2 (two) times daily for 5 days. 11/09/18 11/14/18  Cathie HoopsYu,  V, PA-C  fluconazole (DIFLUCAN) 150 MG tablet Take 1 tablet (150 mg total) by mouth daily. Take second dose 72 hours later if symptoms still persists. 11/09/18   Cathie HoopsYu,  V, PA-C  fluticasone (FLONASE) 50 MCG/ACT nasal spray Place 1 spray into both nostrils daily. 07/01/18   Janne NapoleonNeese, Hope M, NP  norgestimate-ethinyl estradiol (SPRINTEC 28) 0.25-35 MG-MCG tablet Take 1 tablet by mouth daily.    [provider]    Family History Family History  Problem Relation Age of Onset  . Diabetes Father        MGM, maternal uncle  . Prostate cancer  Maternal Grandfather   . Breast cancer Maternal Aunt   . Colon cancer Neg Hx     Social History Social History   Tobacco Use  . Smoking status: Never Smoker  . Smokeless tobacco: Never Used  Substance Use Topics  . Alcohol use: Yes    Comment: occasionally  . Drug use: No     Allergies   Patient has no known allergies.   Review of Systems Review of Systems  Reason unable to perform ROS: See HPI as above.     Physical Exam Triage Vital Signs ED Triage Vitals  Enc Vitals Group     BP 11/09/18 1539 (!) 144/84     Pulse Rate 11/09/18 1539 73     Resp 11/09/18 1539 16     Temp 11/09/18 1539 98.6 F (37 C)     Temp Source 11/09/18 1539 Oral     SpO2 11/09/18 1539 98 %     Weight --      Height --      Head Circumference --      Peak Flow --      Pain Score 11/09/18 1538 8     Pain Loc --      Pain Edu? --      Excl. in GC? --  No data found.  Updated Vital Signs BP (!) 144/84 (BP Location: Right Arm)   Pulse 73   Temp 98.6 F (37 C) (Oral)   Resp 16   LMP 10/26/2018   SpO2 98%   Physical Exam Constitutional:      General: She is not in acute distress.    Appearance: She is well-developed. She is not ill-appearing, toxic-appearing or diaphoretic.  HENT:     Head: Normocephalic and atraumatic.  Eyes:     Conjunctiva/sclera: Conjunctivae normal.     Pupils: Pupils are equal, round, and reactive to light.  Cardiovascular:     Rate and Rhythm: Normal rate and regular rhythm.     Heart sounds: Normal heart sounds. No murmur. No friction rub. No gallop.   Pulmonary:     Effort: Pulmonary effort is normal.     Breath sounds: Normal breath sounds. No wheezing or rales.  Abdominal:     General: Bowel sounds are normal.     Palpations: Abdomen is soft.     Tenderness: There is no abdominal tenderness. There is no right CVA tenderness, left CVA tenderness, guarding or rebound.  Skin:    General: Skin is warm and dry.  Neurological:     Mental Status:  She is alert and oriented to person, place, and time.  Psychiatric:        Behavior: Behavior normal.        Judgment: Judgment normal.      UC Treatments / Results  Labs (all labs ordered are listed, but only abnormal results are displayed) Labs Reviewed  POCT URINALYSIS DIP (DEVICE) - Abnormal; Notable for the following components:      Result Value   Hgb urine dipstick TRACE (*)    Leukocytes,Ua SMALL (*)    All other components within normal limits  URINE CULTURE    EKG None  Radiology No results found.  Procedures Procedures (including critical care time)  Medications Ordered in UC Medications - No data to display  Initial Impression / Assessment and Plan / UC Course  I have reviewed the triage vital signs and the nursing notes.  Pertinent labs & imaging results that were available during my care of the patient were reviewed by me and considered in my medical decision making (see chart for details).    Urine dipstick positive for UTI. Start antibiotics as directed. Push fluids. Return precautions given.  Final Clinical Impressions(s) / UC Diagnoses   Final diagnoses:  Cystitis    ED Prescriptions    Medication Sig Dispense Auth. Provider   cephALEXin (KEFLEX) 500 MG capsule Take 1 capsule (500 mg total) by mouth 2 (two) times daily for 5 days. 10 capsule ,  V, PA-C   fluconazole (DIFLUCAN) 150 MG tablet Take 1 tablet (150 mg total) by mouth daily. Take second dose 72 hours later if symptoms still persists. 2 tablet Threasa Alpha, PA-C 11/09/18 1611

## 2018-11-09 NOTE — Discharge Instructions (Signed)
Your urine was positive for an urinary tract infection. Start keflex as directed. Keep hydrated, urine should be clear to pale yellow in color. Monitor for any worsening of symptoms, fever, worsening abdominal pain, nausea/vomiting, flank pain, follow up for reevaluation.   Called in diflucan as needed if develop yeast infection with antibiotic.

## 2018-11-09 NOTE — ED Triage Notes (Addendum)
Pt BIB [facility]. Pt c/o of anxiety after eating two homeade edibles - cookies - at 1730. Pt reports this is her first time using this and reports chest tightness and her lips are numb.   Pt was seen today at urgent care for a UTI.

## 2018-11-10 LAB — URINE CULTURE

## 2019-02-02 ENCOUNTER — Emergency Department (HOSPITAL_COMMUNITY)
Admission: EM | Admit: 2019-02-02 | Discharge: 2019-02-03 | Disposition: A | Discharge status: Home / Self Care | Payer: BLUE CROSS/BLUE SHIELD | Attending: Emergency Medicine | Admitting: Emergency Medicine

## 2019-02-02 ENCOUNTER — Encounter (HOSPITAL_COMMUNITY): Payer: Self-pay | Admitting: Emergency Medicine

## 2019-02-02 ENCOUNTER — Emergency Department (HOSPITAL_COMMUNITY): Payer: BLUE CROSS/BLUE SHIELD

## 2019-02-02 DIAGNOSIS — J45909 Unspecified asthma, uncomplicated: Secondary | ICD-10-CM | POA: Insufficient documentation

## 2019-02-02 DIAGNOSIS — R0789 Other chest pain: Secondary | ICD-10-CM

## 2019-02-02 LAB — CBC
HCT: 39.3 % (ref 36.0–46.0)
Hemoglobin: 12.7 g/dL (ref 12.0–15.0)
MCH: 28 pg (ref 26.0–34.0)
MCHC: 32.3 g/dL (ref 30.0–36.0)
MCV: 86.8 fL (ref 80.0–100.0)
Platelets: 273 10*3/uL (ref 150–400)
RBC: 4.53 MIL/uL (ref 3.87–5.11)
RDW: 12.9 % (ref 11.5–15.5)
WBC: 7.5 10*3/uL (ref 4.0–10.5)
nRBC: 0 % (ref 0.0–0.2)

## 2019-02-02 LAB — BASIC METABOLIC PANEL
Anion gap: 9 (ref 5–15)
BUN: 14 mg/dL (ref 6–20)
CO2: 25 mmol/L (ref 22–32)
Calcium: 8.8 mg/dL — ABNORMAL LOW (ref 8.9–10.3)
Chloride: 103 mmol/L (ref 98–111)
Creatinine, Ser: 0.95 mg/dL (ref 0.44–1.00)
GFR calc Af Amer: >60 mL/min (ref >60)
GFR calc non Af Amer: >60 mL/min (ref >60)
Glucose, Bld: 87 mg/dL (ref 70–99)
Potassium: 3.7 mmol/L (ref 3.5–5.1)
Sodium: 137 mmol/L (ref 135–145)

## 2019-02-02 LAB — I-STAT BETA HCG BLOOD, ED (MC, WL, AP ONLY): I-stat hCG, quantitative: <5 m[IU]/mL (ref <5)

## 2019-02-02 LAB — TROPONIN I (HIGH SENSITIVITY): Troponin I (High Sensitivity): <2 ng/L (ref <18)

## 2019-02-02 MED ORDER — SODIUM CHLORIDE 0.9% FLUSH: 3.0000 mL | Freq: Once | INTRAVENOUS | Status: DC

## 2019-02-02 NOTE — ED Triage Notes (Signed)
Pt c/o upper chest pressure and shob x 2 weeks, denies cough, +nausea, intermittent numbness and tingling down arms, denies d/v. Pt states she has noticed tremors at times to R arm. Pt tearful in triage.

## 2019-02-02 NOTE — ED Provider Notes (Signed)
TIME SEEN: 11:39 PM  CHIEF COMPLAINT: Chest pain  HPI: Patient is a 38 year old female with history of obesity, hyperlipidemia who presents to the emergency department with 2 to 3 weeks of intermittent chest pain.  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  States she would have some diffuse chest tightness with tingling down both arms and shortness of breath.  Symptoms have improved currently.  She denies any aggravating or alleviating factors.  No nausea, vomiting, diaphoresis or dizziness.  Does have a family history of CAD.  Reports her father had heart attacks in his 5860s.  No history of PE, DVT, exogenous estrogen use, recent fractures, surgery, trauma, hospitalization or prolonged travel. No lower extremity swelling or pain. No calf tenderness.   ROS: See HPI Constitutional: no fever  Eyes: no drainage  ENT: no runny nose   Cardiovascular:   chest pain  Resp:  SOB  GI: no vomiting GU: no dysuria Integumentary: no rash  Allergy: no hives  Musculoskeletal: no leg swelling  Neurological: no slurred speech ROS otherwise negative  PAST MEDICAL HISTORY/PAST SURGICAL HISTORY:  Past Medical History:  Diagnosis Date  . Asthma   . Gallstones   . Hyperlipemia     MEDICATIONS:  Prior to Admission medications   Medication Sig Start Date End Date Taking? Authorizing Provider  benzonatate (TESSALON) 100 MG capsule Take 1 capsule (100 mg total) by mouth every 8 (eight) hours. Patient not taking: Reported on 11/09/2018 07/01/18   Janne NapoleonNeese, Hope M, NP  fluconazole (DIFLUCAN) 150 MG tablet Take 1 tablet (150 mg total) by mouth daily. Take second dose 72 hours later if symptoms still persists. 11/09/18   Cathie HoopsYu, Amy V, PA-C  fluticasone (FLONASE) 50 MCG/ACT nasal spray Place 1 spray into both nostrils daily. Patient not taking: Reported on 11/09/2018 07/01/18   Janne NapoleonNeese, Hope M, NP  potassium chloride SA (K-DUR) 20 MEQ tablet Take 1 tablet (20 mEq total) by mouth 2 (two) times  daily for 2 days. 11/09/18 11/11/18  Alvira MondaySchlossman, Erin, MD    ALLERGIES:  No Known Allergies  SOCIAL HISTORY:  Social History   Tobacco Use  . Smoking status: Never Smoker  . Smokeless tobacco: Never Used  Substance Use Topics  . Alcohol use: Yes    Comment: occasionally    FAMILY HISTORY: Family History  Problem Relation Age of Onset  . Diabetes Father        MGM, maternal uncle  . Prostate cancer Maternal Grandfather   . Breast cancer Maternal Aunt   . Colon cancer Neg Hx     EXAM: BP 118/84 (BP Location: Right Arm)   Pulse 92   Temp 98.6 F (37 C) (Oral)   Resp 18   Ht 5\' 9"  (1.753 m)   Wt 117 kg   LMP 01/29/2019   SpO2 99%   BMI 38.09 kg/m  CONSTITUTIONAL: Alert and oriented and responds appropriately to questions. Well-appearing; well-nourished, obese, anxious HEAD: Normocephalic EYES: Conjunctivae clear, pupils appear equal, EOMI ENT: normal nose; moist mucous membranes NECK: Supple, no meningismus, no nuchal rigidity, no LAD  CARD: RRR; S1 and S2 appreciated; no murmurs, no clicks, no rubs, no gallops RESP: Normal chest excursion without splinting or tachypnea; breath sounds clear and equal bilaterally; no wheezes, no rhonchi, no rales, no hypoxia or respiratory distress, speaking full sentences ABD/GI: Normal bowel sounds; non-distended; soft, non-tender, no rebound, no guarding, no peritoneal signs, no hepatosplenomegaly BACK:  The back appears normal and is  non-tender to palpation, there is no CVA tenderness EXT: Normal ROM in all joints; non-tender to palpation; no edema; normal capillary refill; no cyanosis, no calf tenderness or swelling    SKIN: Normal color for age and race; warm; no rash NEURO: Moves all extremities equally PSYCH: The patient's mood and manner are appropriate. Grooming and personal hygiene are appropriate.  MEDICAL DECISION MAKING: Patient here with chest pain.  She has history of hyperlipidemia and obesity.  She does have a family  history of CAD.  She is not a smoker.  Her first troponin is negative.  Her heart score is 2-3.  She is currently chest pain-free.  She has no risk factors for PE and is PERC negative.  Doubt dissection.  No signs of volume overload, pneumonia.  Plan is to repeat second troponin.  If negative will have her follow-up with a primary care doctor as an outpatient.  ED PROGRESS: Second troponin negative.  Patient still very anxious about her chest pain.  I have encouraged her to follow-up with her primary care physician as an outpatient as I suspect anxiety is contributing to her symptoms.  I feel she would benefit from medications but discussed that this would be prescribed by her primary doctor as these medications can come with side effects and will need to be closely monitored.   At this time, I do not feel there is any life-threatening condition present. I have reviewed and discussed all results (EKG, imaging, lab, urine as appropriate) and exam findings with patient/family. I have reviewed nursing notes and appropriate previous records.  I feel the patient is safe to be discharged home without further emergent workup and can continue workup as an outpatient as needed. Discussed usual and customary return precautions. Patient/family verbalize understanding and are comfortable with this plan.  Outpatient follow-up has been provided as needed. All questions have been answered.      EKG Interpretation  Date/Time:  Sunday February 02 2019 20:24:06 EDT Ventricular Rate:  92 PR Interval:  154 QRS Duration: 70 QT Interval:  356 QTC Calculation: 440 R Axis:   56 Text Interpretation:  Normal sinus rhythm with sinus arrhythmia Normal ECG Rate slower than normal Confirmed by Ward, Cyril Mourning [phone]) on 02/03/2019 12:02:34 AM         Ward, Delice Bison, DO 02/03/19 0200

## 2019-02-03 LAB — TROPONIN I (HIGH SENSITIVITY): Troponin I (High Sensitivity): <2 ng/L (ref <18)

## 2019-02-03 NOTE — ED Notes (Signed)
Pt given turkey sandwich and water

## 2019-02-03 NOTE — Discharge Instructions (Signed)
Steps to find a Primary Care Provider (PCP): ° °Call [phone] or [phone] to access "[facility] Find a Doctor Service." ° °2.  You may also go on the [facility] website at [email or web address] ° °3.  [facility] also frequently accepts new patients. ° °[facility]  °[address] °[place] [address] °[phone] ° °4.  There are also multiple [facility], [facility], [facility] and [facility] practices throughout the [place] that are frequently accepting new patients. You may find a clinic that is close to your home and contact them. ° °[facility] °[email or web address] °[phone] ° °[facility] °[email or web address] ° °[facility] °[email or web address] °[phone] ° °[facility] °[email or web address] °[phone] ° °5.  Local Health Departments also can provide primary care services. ° °[facility]  °[address] °[place] [address] °[phone] ° °[facility] °[address] °[place] [address] °[phone] ° °[facility] °[address]  °[place] [address] °[phone] ° ° °

## 2019-04-15 ENCOUNTER — Encounter (HOSPITAL_COMMUNITY): Payer: Self-pay

## 2019-04-15 ENCOUNTER — Ambulatory Visit (HOSPITAL_COMMUNITY)
Admission: EM | Admit: 2019-04-15 | Discharge: 2019-04-15 | Disposition: A | Discharge status: Home / Self Care | Payer: BLUE CROSS/BLUE SHIELD

## 2019-04-15 DIAGNOSIS — L03317 Cellulitis of buttock: Secondary | ICD-10-CM | POA: Diagnosis not present

## 2019-04-15 DIAGNOSIS — Z3202 Encounter for pregnancy test, result negative: Secondary | ICD-10-CM

## 2019-04-15 LAB — POCT URINALYSIS DIP (DEVICE)
Bilirubin Urine: NEGATIVE
Glucose, UA: NEGATIVE mg/dL
Ketones, ur: NEGATIVE mg/dL
Leukocytes,Ua: NEGATIVE
Nitrite: NEGATIVE
Protein, ur: NEGATIVE mg/dL
Specific Gravity, Urine: >=1.03 (ref 1.005–1.030)
Urobilinogen, UA: 0.2 mg/dL (ref 0.0–1.0)
pH: 5.5 (ref 5.0–8.0)

## 2019-04-15 LAB — POCT PREGNANCY, URINE: Preg Test, Ur: NEGATIVE

## 2019-04-15 MED ORDER — CEFTRIAXONE SODIUM 1 G IJ SOLR
1.0000 g | Freq: Once | INTRAMUSCULAR | Status: AC
  Administered 2019-04-15: 1 g via INTRAMUSCULAR

## 2019-04-15 MED ORDER — TRAMADOL HCL 50 MG PO TABS: 50.0000 mg | ORAL_TABLET | Freq: Four times a day (QID) | ORAL | 0 refills | Status: DC | PRN

## 2019-04-15 MED ORDER — CEFTRIAXONE SODIUM 1 G IJ SOLR
INTRAMUSCULAR | Status: AC
  Filled 2019-04-15: qty 10

## 2019-04-15 MED ORDER — DOXYCYCLINE HYCLATE 100 MG PO CAPS: 100.0000 mg | ORAL_CAPSULE | Freq: Two times a day (BID) | ORAL | 0 refills | Status: DC

## 2019-04-15 NOTE — Discharge Instructions (Signed)
Treating you for cellulitis.  Antibiotic injection given here in clinic.  We are sending some more antibiotics to the pharmacy for you to start taking today.  Tramadol for severe pain. You can also take Tylenol or ibuprofen Please come back in 2 days for recheck If the area severely worsens you need to go to the hospital.

## 2019-04-15 NOTE — ED Provider Notes (Signed)
[facility]    CSN: [id number] Arrival date & time: 04/15/19  1207      History   Chief Complaint Chief Complaint  Patient presents with  . Abscess  . Abdominal Cramping    HPI Amy Mendoza is a 38 y.o. female.   Pt is a 38 year old female with past medical history of asthma, gallstones, hyperlipidemia.  She presents today with abscess and pain to the right upper thigh/buttocks area x3 days.  The problem has worsened.  Described as very painful with pain radiating into groin area.  She soaked in a hot bath which did not help. Mild drainage from the area.  She also took ibuprofen without much relief.  No associated fever, chills, body aches, nausea or vomiting. No hx of MRSA   ROS per HPI       Past Medical History:  Diagnosis Date  . Asthma   . Gallstones   . Hyperlipemia     Patient Active Problem List   Diagnosis Date Noted  . Routine general medical examination at a health care facility 01/18/2017    Past Surgical History:  Procedure Laterality Date  . CHOLECYSTECTOMY  feb 2006  . KNEE SURGERY     left    OB History   No obstetric history on file.      Home Medications    Prior to Admission medications   Medication Sig Start Date End Date Taking? Authorizing Provider  levonorgestrel (MIRENA) 20 MCG/24HR IUD 1 each by Intrauterine route once.   Yes [provider]  doxycycline (VIBRAMYCIN) 100 MG capsule Take 1 capsule (100 mg total) by mouth 2 (two) times daily. 04/15/19   Loura Halt A, NP  fluconazole (DIFLUCAN) 150 MG tablet Take 1 tablet (150 mg total) by mouth daily. Take second dose 72 hours later if symptoms still persists. 11/09/18   Tasia Catchings, Amy V, PA-C  potassium chloride SA (K-DUR) 20 MEQ tablet Take 1 tablet (20 mEq total) by mouth 2 (two) times daily for 2 days. 11/09/18 11/11/18  Gareth Morgan, MD  traMADol (ULTRAM) 50 MG tablet Take 1 tablet (50 mg total) by mouth every 6 (six) hours as needed. 04/15/19   Mallary Kreger, Tressia Miners  A, NP  fluticasone (FLONASE) 50 MCG/ACT nasal spray Place 1 spray into both nostrils daily. Patient not taking: Reported on 11/09/2018 07/01/18 04/15/19  Ashley Murrain, NP    Family History Family History  Problem Relation Age of Onset  . Diabetes Father        MGM, maternal uncle  . Prostate cancer Maternal Grandfather   . Breast cancer Maternal Aunt   . Colon cancer Neg Hx     Social History Social History   Tobacco Use  . Smoking status: Never Smoker  . Smokeless tobacco: Never Used  Substance Use Topics  . Alcohol use: Yes    Comment: occasionally  . Drug use: Yes    Types: Marijuana     Allergies   Patient has no known allergies.   Review of Systems Review of Systems   Physical Exam Triage Vital Signs ED Triage Vitals  Enc Vitals Group     BP 04/15/19 1230 121/75     Pulse Rate 04/15/19 1230 (!) 104     Resp 04/15/19 1230 18     Temp 04/15/19 1230 98.3 F (36.8 C)     Temp Source 04/15/19 1230 Temporal     SpO2 04/15/19 1230 98 %     Weight --  Height --      Head Circumference --      Peak Flow --      Pain Score 04/15/19 1229 10     Pain Loc --      Pain Edu? --      Excl. in GC? --    No data found.  Updated Vital Signs BP 121/75 (BP Location: Left Arm)   Pulse (!) 104   Temp 98.3 F (36.8 C) (Temporal)   Resp 18   SpO2 98%   Visual Acuity Right Eye Distance:   Left Eye Distance:   Bilateral Distance:    Right Eye Near:   Left Eye Near:    Bilateral Near:     Physical Exam Vitals signs and nursing note reviewed.  Constitutional:      General: She is not in acute distress.    Appearance: Normal appearance. She is obese. She is not ill-appearing, toxic-appearing or diaphoretic.  HENT:     Head: Normocephalic.     Nose: Nose normal.     Mouth/Throat:     Pharynx: Oropharynx is clear.  Eyes:     Conjunctiva/sclera: Conjunctivae normal.  Neck:     Musculoskeletal: Normal range of motion.  Pulmonary:     Effort: Pulmonary  effort is normal.  Abdominal:     Palpations: Abdomen is soft.     Tenderness: There is no abdominal tenderness.  Musculoskeletal: Normal range of motion.  Skin:    General: Skin is warm and dry.     Findings: No rash.     Comments: See picture for detail. Approximated 9-10 cm. Warm, indurated. No fluctuance. No drainage. Very tender to touch.   Neurological:     Mental Status: She is alert.  Psychiatric:        Mood and Affect: Mood normal.        UC Treatments / Results  Labs (all labs ordered are listed, but only abnormal results are displayed) Labs Reviewed  POCT URINALYSIS DIP (DEVICE) - Abnormal; Notable for the following components:      Result Value   Hgb urine dipstick SMALL (*)    All other components within normal limits  POCT PREGNANCY, URINE    EKG   Radiology No results found.  Procedures Procedures (including critical care time)  Medications Ordered in UC Medications  cefTRIAXone (ROCEPHIN) injection 1 g (1 g Intramuscular Given 04/15/19 1324)  cefTRIAXone (ROCEPHIN) 1 g injection (has no administration in time range)    Initial Impression / Assessment and Plan / UC Course  I have reviewed the triage vital signs and the nursing notes.  Pertinent labs & imaging results that were available during my care of the patient were reviewed by me and considered in my medical decision making (see chart for details).     Cellulitis- no indication for I &D today.  Treating for infection with 1 G rocephin here and sending home with doxycyline.  Tramadol for pain.  Recommended coming back in 2 days for recheck.  Sooner if worse.  Pt agrees.  Final Clinical Impressions(s) / UC Diagnoses   Final diagnoses:  Cellulitis of buttock     Discharge Instructions     Treating you for cellulitis.  Antibiotic injection given here in clinic.  We are sending some more antibiotics to the pharmacy for you to start taking today.  Tramadol for severe pain. You can  also take Tylenol or ibuprofen Please come back in 2 days for recheck If  the area severely worsens you need to go to the hospital.    ED Prescriptions    Medication Sig Dispense Auth. Provider   doxycycline (VIBRAMYCIN) 100 MG capsule Take 1 capsule (100 mg total) by mouth 2 (two) times daily. 20 capsule Fatime Biswell A, NP   traMADol (ULTRAM) 50 MG tablet Take 1 tablet (50 mg total) by mouth every 6 (six) hours as needed. 15 tablet Cason Luffman A, NP     I have reviewed the PDMP during this encounter.   Janace Aris, NP 04/15/19 1343

## 2019-04-15 NOTE — ED Triage Notes (Signed)
Patient report she is having an abscess x 3 days in her right buttock  and abdominal cramps x 2 days. She is using hot compress.

## 2019-05-19 ENCOUNTER — Other Ambulatory Visit: Payer: Self-pay

## 2019-05-19 DIAGNOSIS — Z20822 Contact with and (suspected) exposure to covid-19: Secondary | ICD-10-CM

## 2019-05-21 LAB — NOVEL CORONAVIRUS, NAA: SARS-CoV-2, NAA: NOT DETECTED

## 2019-06-30 ENCOUNTER — Other Ambulatory Visit: Payer: Self-pay

## 2019-06-30 DIAGNOSIS — Z20822 Contact with and (suspected) exposure to covid-19: Secondary | ICD-10-CM

## 2019-07-02 ENCOUNTER — Other Ambulatory Visit: Payer: Self-pay

## 2019-07-02 ENCOUNTER — Inpatient Hospital Stay (HOSPITAL_COMMUNITY)
Admission: EM | Admit: 2019-07-02 | Discharge: 2019-07-08 | DRG: 871 | Disposition: A | Discharge status: Home / Self Care | Payer: BLUE CROSS/BLUE SHIELD | Attending: Internal Medicine | Admitting: Internal Medicine

## 2019-07-02 ENCOUNTER — Encounter (HOSPITAL_COMMUNITY): Payer: Self-pay

## 2019-07-02 DIAGNOSIS — Z6841 Body Mass Index (BMI) 40.0 and over, adult: Secondary | ICD-10-CM

## 2019-07-02 DIAGNOSIS — Z975 Presence of (intrauterine) contraceptive device: Secondary | ICD-10-CM

## 2019-07-02 DIAGNOSIS — A4189 Other specified sepsis: Principal | ICD-10-CM | POA: Diagnosis present

## 2019-07-02 DIAGNOSIS — M549 Dorsalgia, unspecified: Secondary | ICD-10-CM | POA: Diagnosis present

## 2019-07-02 DIAGNOSIS — J189 Pneumonia, unspecified organism: Secondary | ICD-10-CM

## 2019-07-02 DIAGNOSIS — R0902 Hypoxemia: Secondary | ICD-10-CM

## 2019-07-02 DIAGNOSIS — J1289 Other viral pneumonia: Secondary | ICD-10-CM | POA: Diagnosis present

## 2019-07-02 DIAGNOSIS — Z20822 Contact with and (suspected) exposure to covid-19: Secondary | ICD-10-CM

## 2019-07-02 DIAGNOSIS — J9601 Acute respiratory failure with hypoxia: Secondary | ICD-10-CM | POA: Diagnosis present

## 2019-07-02 DIAGNOSIS — R0602 Shortness of breath: Secondary | ICD-10-CM | POA: Diagnosis not present

## 2019-07-02 DIAGNOSIS — U071 COVID-19: Secondary | ICD-10-CM | POA: Diagnosis present

## 2019-07-02 DIAGNOSIS — J4520 Mild intermittent asthma, uncomplicated: Secondary | ICD-10-CM | POA: Diagnosis present

## 2019-07-02 DIAGNOSIS — R079 Chest pain, unspecified: Secondary | ICD-10-CM

## 2019-07-02 DIAGNOSIS — Z833 Family history of diabetes mellitus: Secondary | ICD-10-CM

## 2019-07-02 DIAGNOSIS — E785 Hyperlipidemia, unspecified: Secondary | ICD-10-CM | POA: Diagnosis present

## 2019-07-02 LAB — CBC
HCT: 42.8 % (ref 36.0–46.0)
Hemoglobin: 13.6 g/dL (ref 12.0–15.0)
MCH: 27.6 pg (ref 26.0–34.0)
MCHC: 31.8 g/dL (ref 30.0–36.0)
MCV: 86.8 fL (ref 80.0–100.0)
Platelets: 171 10*3/uL (ref 150–400)
RBC: 4.93 MIL/uL (ref 3.87–5.11)
RDW: 13.2 % (ref 11.5–15.5)
WBC: 6.3 10*3/uL (ref 4.0–10.5)
nRBC: 0 % (ref 0.0–0.2)

## 2019-07-02 LAB — I-STAT BETA HCG BLOOD, ED (MC, WL, AP ONLY): I-stat hCG, quantitative: <5 m[IU]/mL (ref <5)

## 2019-07-02 LAB — NOVEL CORONAVIRUS, NAA: SARS-CoV-2, NAA: NOT DETECTED

## 2019-07-02 MED ORDER — SODIUM CHLORIDE 0.9% FLUSH: 3.0000 mL | Freq: Once | INTRAVENOUS | Status: DC

## 2019-07-02 MED ORDER — ACETAMINOPHEN 500 MG PO TABS
1000.0000 mg | ORAL_TABLET | Freq: Once | ORAL | Status: AC
  Administered 2019-07-02: 1000 mg via ORAL
  Filled 2019-07-02: qty 2

## 2019-07-02 NOTE — ED Triage Notes (Signed)
Pt arrives POV for eval of shortness of breath, cough, back pain and general malaise x1 week. Pt reports fevers at home, pt endorses multiple family members w/ Covid + status. Pt reports pain is slight in chest, but worse in back. Febrile to 102 in triage, tylenol given.

## 2019-07-03 ENCOUNTER — Emergency Department (HOSPITAL_COMMUNITY): Payer: BLUE CROSS/BLUE SHIELD

## 2019-07-03 ENCOUNTER — Encounter (HOSPITAL_COMMUNITY): Payer: Self-pay | Admitting: Emergency Medicine

## 2019-07-03 DIAGNOSIS — J1282 Pneumonia due to coronavirus disease 2019: Secondary | ICD-10-CM | POA: Diagnosis present

## 2019-07-03 DIAGNOSIS — U071 COVID-19: Secondary | ICD-10-CM | POA: Diagnosis present

## 2019-07-03 DIAGNOSIS — Z975 Presence of (intrauterine) contraceptive device: Secondary | ICD-10-CM | POA: Diagnosis not present

## 2019-07-03 DIAGNOSIS — J9601 Acute respiratory failure with hypoxia: Secondary | ICD-10-CM | POA: Diagnosis present

## 2019-07-03 DIAGNOSIS — J1289 Other viral pneumonia: Secondary | ICD-10-CM | POA: Diagnosis present

## 2019-07-03 DIAGNOSIS — R0602 Shortness of breath: Secondary | ICD-10-CM | POA: Diagnosis present

## 2019-07-03 DIAGNOSIS — A4189 Other specified sepsis: Secondary | ICD-10-CM | POA: Diagnosis present

## 2019-07-03 DIAGNOSIS — J4520 Mild intermittent asthma, uncomplicated: Secondary | ICD-10-CM | POA: Diagnosis present

## 2019-07-03 DIAGNOSIS — Z833 Family history of diabetes mellitus: Secondary | ICD-10-CM | POA: Diagnosis not present

## 2019-07-03 DIAGNOSIS — M549 Dorsalgia, unspecified: Secondary | ICD-10-CM | POA: Diagnosis present

## 2019-07-03 DIAGNOSIS — Z6841 Body Mass Index (BMI) 40.0 and over, adult: Secondary | ICD-10-CM | POA: Diagnosis not present

## 2019-07-03 DIAGNOSIS — E785 Hyperlipidemia, unspecified: Secondary | ICD-10-CM | POA: Diagnosis present

## 2019-07-03 LAB — BASIC METABOLIC PANEL
Anion gap: 8 (ref 5–15)
BUN: 5 mg/dL — ABNORMAL LOW (ref 6–20)
CO2: 25 mmol/L (ref 22–32)
Calcium: 8.3 mg/dL — ABNORMAL LOW (ref 8.9–10.3)
Chloride: 102 mmol/L (ref 98–111)
Creatinine, Ser: 0.85 mg/dL (ref 0.44–1.00)
GFR calc Af Amer: >60 mL/min (ref >60)
GFR calc non Af Amer: >60 mL/min (ref >60)
Glucose, Bld: 128 mg/dL — ABNORMAL HIGH (ref 70–99)
Potassium: 4 mmol/L (ref 3.5–5.1)
Sodium: 135 mmol/L (ref 135–145)

## 2019-07-03 LAB — TYPE AND SCREEN
ABO/RH(D): O POS
Antibody Screen: NEGATIVE

## 2019-07-03 LAB — TROPONIN I (HIGH SENSITIVITY)
Troponin I (High Sensitivity): 3 ng/L (ref <18)
Troponin I (High Sensitivity): 2 ng/L (ref <18)

## 2019-07-03 LAB — HEPATIC FUNCTION PANEL
ALT: 19 U/L (ref 0–44)
AST: 27 U/L (ref 15–41)
Albumin: 3.1 g/dL — ABNORMAL LOW (ref 3.5–5.0)
Alkaline Phosphatase: 45 U/L (ref 38–126)
Bilirubin, Direct: 0.1 mg/dL (ref 0.0–0.2)
Indirect Bilirubin: 0.6 mg/dL (ref 0.3–0.9)
Total Bilirubin: 0.7 mg/dL (ref 0.3–1.2)
Total Protein: 7.4 g/dL (ref 6.5–8.1)

## 2019-07-03 LAB — FERRITIN: Ferritin: 79 ng/mL (ref 11–307)

## 2019-07-03 LAB — D-DIMER, QUANTITATIVE: D-Dimer, Quant: 1.14 ug/mL-FEU — ABNORMAL HIGH (ref 0.00–0.50)

## 2019-07-03 LAB — PROCALCITONIN: Procalcitonin: 0.23 ng/mL

## 2019-07-03 LAB — LACTIC ACID, PLASMA
Lactic Acid, Venous: 0.8 mmol/L (ref 0.5–1.9)
Lactic Acid, Venous: 0.8 mmol/L (ref 0.5–1.9)

## 2019-07-03 LAB — LIPASE, BLOOD: Lipase: 26 U/L (ref 11–51)

## 2019-07-03 LAB — LACTATE DEHYDROGENASE: LDH: 269 U/L — ABNORMAL HIGH (ref 98–192)

## 2019-07-03 LAB — ABO/RH: ABO/RH(D): O POS

## 2019-07-03 LAB — FIBRINOGEN: Fibrinogen: 546 mg/dL — ABNORMAL HIGH (ref 210–475)

## 2019-07-03 LAB — SARS CORONAVIRUS 2 (TAT 6-24 HRS): SARS Coronavirus 2: POSITIVE — AB

## 2019-07-03 LAB — POC SARS CORONAVIRUS 2 AG -  ED: SARS Coronavirus 2 Ag: NEGATIVE

## 2019-07-03 LAB — HIV ANTIBODY (ROUTINE TESTING W REFLEX): HIV Screen 4th Generation wRfx: NONREACTIVE

## 2019-07-03 LAB — C-REACTIVE PROTEIN: CRP: 9.6 mg/dL — ABNORMAL HIGH (ref <1.0)

## 2019-07-03 LAB — TRIGLYCERIDES: Triglycerides: 51 mg/dL (ref <150)

## 2019-07-03 MED ORDER — ASCORBIC ACID 500 MG PO TABS
500.0000 mg | ORAL_TABLET | Freq: Every day | ORAL | Status: DC
  Administered 2019-07-03 – 2019-07-08 (×6): 500 mg via ORAL
  Filled 2019-07-03 (×6): qty 1

## 2019-07-03 MED ORDER — ENOXAPARIN SODIUM 40 MG/0.4ML ~~LOC~~ SOLN
40.0000 mg | SUBCUTANEOUS | Status: DC
  Administered 2019-07-03: 40 mg via SUBCUTANEOUS
  Filled 2019-07-03: qty 0.4

## 2019-07-03 MED ORDER — ONDANSETRON HCL 4 MG/2ML IJ SOLN: 4.0000 mg | Freq: Four times a day (QID) | INTRAMUSCULAR | Status: DC | PRN

## 2019-07-03 MED ORDER — ONDANSETRON HCL 4 MG PO TABS: 4.0000 mg | ORAL_TABLET | Freq: Four times a day (QID) | ORAL | Status: DC | PRN

## 2019-07-03 MED ORDER — ZINC SULFATE 220 (50 ZN) MG PO CAPS
220.0000 mg | ORAL_CAPSULE | Freq: Every day | ORAL | Status: DC
  Administered 2019-07-03 – 2019-07-08 (×6): 220 mg via ORAL
  Filled 2019-07-03 (×6): qty 1

## 2019-07-03 MED ORDER — HYDROCOD POLST-CPM POLST ER 10-8 MG/5ML PO SUER
5.0000 mL | Freq: Two times a day (BID) | ORAL | Status: DC | PRN
  Administered 2019-07-04: 5 mL via ORAL
  Filled 2019-07-03: qty 5

## 2019-07-03 MED ORDER — SODIUM CHLORIDE 0.9 % IV SOLN
2.0000 g | INTRAVENOUS | Status: DC
  Administered 2019-07-03 – 2019-07-04 (×2): 2 g via INTRAVENOUS
  Filled 2019-07-03 (×2): qty 20

## 2019-07-03 MED ORDER — FAMOTIDINE 20 MG PO TABS
20.0000 mg | ORAL_TABLET | Freq: Two times a day (BID) | ORAL | Status: DC
  Administered 2019-07-03 – 2019-07-08 (×11): 20 mg via ORAL
  Filled 2019-07-03 (×11): qty 1

## 2019-07-03 MED ORDER — DEXAMETHASONE SODIUM PHOSPHATE 10 MG/ML IJ SOLN
6.0000 mg | INTRAMUSCULAR | Status: DC
  Administered 2019-07-04 – 2019-07-07 (×4): 6 mg via INTRAVENOUS
  Filled 2019-07-03 (×5): qty 1

## 2019-07-03 MED ORDER — GUAIFENESIN-DM 100-10 MG/5ML PO SYRP
10.0000 mL | ORAL_SOLUTION | ORAL | Status: DC | PRN
  Administered 2019-07-03: 10 mL via ORAL
  Filled 2019-07-03: qty 10

## 2019-07-03 MED ORDER — IOHEXOL 350 MG/ML SOLN
80.0000 mL | Freq: Once | INTRAVENOUS | Status: AC | PRN
  Administered 2019-07-03: 80 mL via INTRAVENOUS

## 2019-07-03 MED ORDER — SODIUM CHLORIDE 0.9 % IV SOLN
200.0000 mg | Freq: Once | INTRAVENOUS | Status: AC
  Administered 2019-07-03: 200 mg via INTRAVENOUS
  Filled 2019-07-03: qty 40

## 2019-07-03 MED ORDER — SODIUM CHLORIDE 0.9% FLUSH
3.0000 mL | Freq: Two times a day (BID) | INTRAVENOUS | Status: DC
  Administered 2019-07-03 – 2019-07-08 (×11): 3 mL via INTRAVENOUS

## 2019-07-03 MED ORDER — SODIUM CHLORIDE 0.9 % IV SOLN
500.0000 mg | INTRAVENOUS | Status: DC
  Administered 2019-07-03 – 2019-07-04 (×2): 500 mg via INTRAVENOUS
  Filled 2019-07-03 (×2): qty 500

## 2019-07-03 MED ORDER — ACETAMINOPHEN 325 MG PO TABS: 650.0000 mg | ORAL_TABLET | Freq: Four times a day (QID) | ORAL | Status: DC | PRN

## 2019-07-03 MED ORDER — ALBUTEROL SULFATE HFA 108 (90 BASE) MCG/ACT IN AERS
2.0000 | INHALATION_SPRAY | Freq: Four times a day (QID) | RESPIRATORY_TRACT | Status: DC
  Administered 2019-07-03 – 2019-07-08 (×18): 2 via RESPIRATORY_TRACT
  Filled 2019-07-03 (×3): qty 6.7

## 2019-07-03 MED ORDER — DEXAMETHASONE SODIUM PHOSPHATE 10 MG/ML IJ SOLN
6.0000 mg | Freq: Once | INTRAMUSCULAR | Status: AC
  Administered 2019-07-03: 6 mg via INTRAVENOUS
  Filled 2019-07-03: qty 1

## 2019-07-03 MED ORDER — SODIUM CHLORIDE 0.9 % IV SOLN
100.0000 mg | Freq: Every day | INTRAVENOUS | Status: AC
  Administered 2019-07-04 – 2019-07-07 (×4): 100 mg via INTRAVENOUS
  Filled 2019-07-03: qty 100
  Filled 2019-07-03 (×3): qty 20

## 2019-07-03 NOTE — ED Notes (Signed)
+  tele   Pt has no phone in the room RN [place] updated pt's mother

## 2019-07-03 NOTE — H&P (Signed)
History and Physical    Amy Mendoza ENI:[id number] DOB: [birth year] DOA: 07/02/2019  Referring MD/NP/PA: Nuala Alpha, PA-C PCP: System, Provider Not In  Patient coming from: home  Chief Complaint: Cough and shortness of breath  I have personally briefly reviewed patient's old medical records in [place]   HPI: Amy Mendoza is a 38 y.o. female with medical history significant of asthma and hyperlipidemia.  She presents with complaints of progressively worsening cough and shortness of breath over the last 3 days.  Symptoms initially started 1 week ago with fevers at home up to 101.5 F.  The following day she reports having multiple episodes of nonbloody diarrhea.  Multiple family members in which she had had close contact had recently tested positive for COVID-19.  He underwent COVID-19 screening 3 days ago, but it was negative at that time.  She developed a "intermittently productive cough and reported associated symptoms of back pain and muscle cramps.  At home she had not tried albuterol.  Denies having any headache, change in taste, dysuria, frequency.   ED Course: On admission into the emergency department patient was noted to be febrile up to 102.3 F, heart rates up to 137, respirations up to 28, blood pressures maintained, and O2 saturations as low as 88% on room air with improvement on 4 L of nasal cannula oxygen. Labs significant for WBC 6.3, lactic acid 0.8, CRP 9.6, fibrinogen 546, pro calcitonin 0.23, LDH 269, troponin 3, and D-dimer 1.14.  Point-of-care testing for Covid was negative.  CT scan of the chest showed multifocal pneumonia concerning for confirmatory test was sent.  Patient was given 1000 mg of Tylenol and 6 mg of Decadron IV.  TRH called to admit.  Review of Systems  Constitutional: Positive for fever and malaise/fatigue.  HENT: Negative for ear pain and sinus pain.   Eyes: Negative for photophobia and pain.  Respiratory: Positive for cough,  sputum production and shortness of breath.   Cardiovascular: Negative for chest pain, palpitations and leg swelling.  Gastrointestinal: Positive for abdominal pain and diarrhea.  Genitourinary: Negative for dysuria and hematuria.  Musculoskeletal: Positive for myalgias. Negative for falls.  Skin: Negative for rash.  Neurological: Negative for dizziness, focal weakness and loss of consciousness.  Psychiatric/Behavioral: Negative for memory loss and suicidal ideas.    Past Medical History:  Diagnosis Date  . Asthma   . Gallstones   . Hyperlipemia     Past Surgical History:  Procedure Laterality Date  . CHOLECYSTECTOMY  feb 2006  . KNEE SURGERY     left     reports that she has never smoked. She has never used smokeless tobacco. She reports current alcohol use. She reports current drug use. Drug: Marijuana.  No Known Allergies  Family History  Problem Relation Age of Onset  . Diabetes Father        MGM, maternal uncle  . Prostate cancer Maternal Grandfather   . Breast cancer Maternal Aunt   . Colon cancer Neg Hx     Prior to Admission medications   Medication Sig Start Date End Date Taking? Authorizing Provider  doxycycline (VIBRAMYCIN) 100 MG capsule Take 1 capsule (100 mg total) by mouth 2 (two) times daily. 04/15/19   Loura Halt A, NP  fluconazole (DIFLUCAN) 150 MG tablet Take 1 tablet (150 mg total) by mouth daily. Take second dose 72 hours later if symptoms still persists. 11/09/18   Ok Edwards, PA-C  levonorgestrel (MIRENA) 20 MCG/24HR IUD 1  each by Intrauterine route once.    [provider]  potassium chloride SA (K-DUR) 20 MEQ tablet Take 1 tablet (20 mEq total) by mouth 2 (two) times daily for 2 days. 11/09/18 11/11/18  Alvira Monday, MD  traMADol (ULTRAM) 50 MG tablet Take 1 tablet (50 mg total) by mouth every 6 (six) hours as needed. 04/15/19   Bast, Gloris Manchester A, NP  fluticasone (FLONASE) 50 MCG/ACT nasal spray Place 1 spray into both nostrils daily. Patient  not taking: Reported on 11/09/2018 07/01/18 04/15/19  Janne Napoleon, NP    Physical Exam:  Constitutional: NAD, calm, comfortable Vitals:   07/03/19 0147 07/03/19 0230 07/03/19 0245 07/03/19 0300  BP: 119/76 119/78 114/64 104/63  Pulse: (!) 106 94 95 94  Resp: (!) 22 (!) 28 (!) 22 (!) 25  Temp: 98.7 F (37.1 C)     TempSrc: Oral     SpO2: (!) 88% 97% 98% 97%  Weight:      Height:       Eyes: PERRL, lids and conjunctivae normal ENMT: Mucous membranes are moist. Posterior pharynx clear of any exudate or lesions.Normal dentition.  Neck: normal, supple, no masses, no thyromegaly Respiratory: clear to auscultation bilaterally, no wheezing, no crackles. Normal respiratory effort. No accessory muscle use.  Cardiovascular: Regular rate and rhythm, no murmurs / rubs / gallops. No extremity edema. 2+ pedal pulses. No carotid bruits.  Abdomen: no tenderness, no masses palpated. No hepatosplenomegaly. Bowel sounds positive.  Musculoskeletal: no clubbing / cyanosis. No joint deformity upper and lower extremities. Good ROM, no contractures. Normal muscle tone.  Skin: no rashes, lesions, ulcers. No induration Neurologic: CN 2-12 grossly intact. Sensation intact, DTR normal. Strength 5/5 in all 4.  Psychiatric: Normal judgment and insight. Alert and oriented x 3. Normal mood.     Labs on Admission: I have personally reviewed following labs and imaging studies  CBC: Recent Labs  Lab 07/02/19 2327  WBC 6.3  HGB 13.6  HCT 42.8  MCV 86.8  PLT 171   Basic Metabolic Panel: Recent Labs  Lab 07/02/19 2327  NA 135  K 4.0  CL 102  CO2 25  GLUCOSE 128*  BUN 5*  CREATININE 0.85  CALCIUM 8.3*   GFR: Estimated Creatinine Clearance: 130.8 mL/min (by C-G formula based on SCr of 0.85 mg/dL). Liver Function Tests: Recent Labs  Lab 07/03/19 0127  AST 27  ALT 19  ALKPHOS 45  BILITOT 0.7  PROT 7.4  ALBUMIN 3.1*   Recent Labs  Lab 07/03/19 0249  LIPASE 26   No results for input(s):  AMMONIA in the last 168 hours. Coagulation Profile: No results for input(s): INR, PROTIME in the last 168 hours. Cardiac Enzymes: No results for input(s): CKTOTAL, CKMB, CKMBINDEX, TROPONINI in the last 168 hours. BNP (last 3 results) No results for input(s): PROBNP in the last 8760 hours. HbA1C: No results for input(s): HGBA1C in the last 72 hours. CBG: No results for input(s): GLUCAP in the last 168 hours. Lipid Profile: Recent Labs    07/03/19 0127  TRIG 51   Thyroid Function Tests: No results for input(s): TSH, T4TOTAL, FREET4, T3FREE, THYROIDAB in the last 72 hours. Anemia Panel: Recent Labs    07/02/19 2327  FERRITIN 79   Urine analysis:    Component Value Date/Time   COLORURINE YELLOW 04/09/2018 0700   APPEARANCEUR HAZY (A) 04/09/2018 0700   LABSPEC >=1.030 04/15/2019 1303   PHURINE 5.5 04/15/2019 1303   GLUCOSEU NEGATIVE 04/15/2019 1303   HGBUR  SMALL (A) 04/15/2019 1303   BILIRUBINUR NEGATIVE 04/15/2019 1303   KETONESUR NEGATIVE 04/15/2019 1303   PROTEINUR NEGATIVE 04/15/2019 1303   UROBILINOGEN 0.2 04/15/2019 1303   NITRITE NEGATIVE 04/15/2019 1303   LEUKOCYTESUR NEGATIVE 04/15/2019 1303   Sepsis Labs: Recent Results (from the past 240 hour(s))  Novel Coronavirus, NAA ([facility])     Status: None   Collection Time: 06/30/19 12:00 AM   Specimen: Nasopharyngeal(NP) swabs in vial transport medium   NASOPHARYNGE  TESTING  Result Value Ref Range Status   SARS-CoV-2, NAA Not Detected Not Detected Final    Comment: This nucleic acid amplification test was developed and its performance characteristics determined by [facility]. Nucleic acid amplification tests include PCR and TMA. This test has not been FDA cleared or approved. This test has been authorized by FDA under an Emergency Use Authorization (EUA). This test is only authorized for the duration of time the declaration that circumstances exist justifying the authorization of the emergency use of  in vitro diagnostic tests for detection of SARS-CoV-2 virus and/or diagnosis of COVID-19 infection under section 564(b)(1) of the Act, 21 U.S.C. 161WRU-0(A360bbb-3(b) (1), unless the authorization is terminated or revoked sooner. When diagnostic testing is negative, the possibility of a false negative result should be considered in the context of a patient's recent exposures and the presence of clinical signs and symptoms consistent with COVID-19. An individual without symptoms of COVID-19 and who is not shedding SARS-CoV-2 virus would  expect to have a negative (not detected) result in this assay.      Radiological Exams on Admission: CT Angio Chest PE W/Cm &/Or Wo Cm  Result Date: 07/03/2019 CLINICAL DATA:  Hypoxia and tachycardia.  Pain. EXAM: CT ANGIOGRAPHY CHEST WITH CONTRAST TECHNIQUE: Multidetector CT imaging of the chest was performed using the standard protocol during bolus administration of intravenous contrast. Multiplanar CT image reconstructions and MIPs were obtained to evaluate the vascular anatomy. CONTRAST:  80mL OMNIPAQUE IOHEXOL 350 MG/ML SOLN COMPARISON:  Chest radiograph July 03, 2019 FINDINGS: Cardiovascular: There is no demonstrable pulmonary embolus. There is no thoracic aortic aneurysm or dissection. Visualized great vessels appear unremarkable. There is no pericardial effusion or pericardial thickening. Mediastinum/Nodes: Thyroid appears unremarkable. There are subcentimeter mediastinal lymph nodes. There is a right pretracheal lymph node measuring 1.2 x 1.1 cm. There is a subcarinal lymph node slightly to the right of midline measuring 1.5 x 1.5 cm. No esophageal lesions are evident. Lungs/Pleura: There is multifocal airspace opacity throughout the lungs fairly diffusely, most severely involving both lower lobes in the right middle lobe but also with fairly extensive involvement in portions of each upper lobe. Scattered areas of mild consolidation are noted. No pleural  effusions are evident. Upper Abdomen: Visualized upper abdominal structures appear normal. Musculoskeletal: No blastic or lytic bone lesions. No chest wall lesions evident. Review of the MIP images confirms the above findings. IMPRESSION: 1. No demonstrable pulmonary embolus. No thoracic aortic aneurysm or dissection. 2. Extensive multifocal pneumonia bilaterally with the greatest concentration of pneumonia in the lower lobes and right middle lobe and also fairly extensive pneumonitis in each upper lobe. 3. Prominent subcarinal and pretracheal lymph nodes which may well have reactive etiology given the extent of parenchymal lung abnormality. Electronically Signed   By: Bretta BangWilliam  Woodruff III M.D.   On: 07/03/2019 07:09   DG Chest Port 1 View  Result Date: 07/03/2019 CLINICAL DATA:  Shortness of breath EXAM: PORTABLE CHEST 1 VIEW COMPARISON:  February 02, 2019 FINDINGS: There are multifocal  ground-glass airspace opacities bilaterally. There is no pneumothorax. No large pleural effusion. The heart size is borderline enlarged. The lung volumes are low. IMPRESSION: Multifocal airspace opacities bilaterally concerning for multifocal pneumonia (viral or bacterial). Electronically Signed   By: Katherine Mantle M.D.   On: 07/03/2019 02:32    EKG: Independently reviewed.  Sinus tachycardia 133 bpm  Assessment/Plan  Acute respiratory failure with hypoxia Sepsis secondary to pneumonia due to COVID-19: Acute.  Patient reports multiple COVID-19 positive family members.  She presents with complaints of shortness of breath and cough.  Found to have fever, tachycardia, and tachypnea.  O2 saturations as low as 88% on room air with improvement on 2 to 4 L of nasal cannula oxygen.  Labs reassuring with white blood cell count and lactic acid within normal limits.  Imaging studies revealing a multifocal pneumonia, and negative for pulmonary embolus.  Inflammatory markers elevated including D-dimer, fibrinogen, CRP, and LDH.   Point-of-care COVID-19 screening was negative.  Patient had been given 6 mg Decadron IV and Tylenol in the emergency department. -Admit to telemetry bed    -COVID-19 order set utilized -Continuous pulse oximetry with nasal cannula oxygen to maintain O2 saturation greater than 90%. -Follow-up confirmatory COVID-19 testing (Positive) -Add on respiratory virus panel -Follow-up blood cultures -Albuterol inhaler -Continue Decadron 6 mg IV daily -Empiric antibiotics of Rocephin and azithromycin -Consulted pharmacy for remdesivir confirmatory test positive -Antitussives as needed -Vitamin C and zinc -Tylenol as needed for fever -Continue to monitor inflammatory markers daily  Mild intermittent asthma: Patient without acute exacerbation. -Albuterol inhaler as seen above  Hyperlipidemia: Patient currently not on any medication for treatment.  DVT prophylaxis: lovenox  Code Status: Full  Family Communication: No family requested to be updated at this time Disposition Plan: Hopefully discharge home in 3 to 5 days Consults called: None Admission status: Inpatient  Clydie Braun MD [facility] Pager [phone]   If 7PM-7AM, please contact night-coverage [email or web address] Password [facility]  07/03/2019, 8:46 AM

## 2019-07-03 NOTE — Progress Notes (Signed)
Pharmacy Consult - Remdesivir  44 yof presenting COVID-19 positive with respiratory symptoms requiring hospitalization. Pharmacy consulted to dose Remdesivir. ALT<220.   Plan: Remdesivir 200mg  IV x 1; then 100mg  IV q24h to complete 5 total doses Monitor clinical progress, ALT   Elicia Lamp, PharmD, BCPS Please check AMION for all [place] contact numbers Clinical Pharmacist 07/03/2019 4:31 PM

## 2019-07-03 NOTE — ED Notes (Signed)
Pt getting ready to go to c-t

## 2019-07-03 NOTE — ED Notes (Signed)
+  tele   [phone] Sydnee Cabal pts son if pt gets a chance please call son

## 2019-07-03 NOTE — ED Notes (Signed)
Pt is NSR on monitor 

## 2019-07-03 NOTE — ED Notes (Signed)
The pt reports that she has been waiting for a c-t for hoours  Asking how long before  Its done

## 2019-07-03 NOTE — ED Notes (Signed)
Pt given turkey sandwich and crackers.

## 2019-07-03 NOTE — ED Provider Notes (Signed)
[facility] DEPARTMENT Provider Note   CSN: [id number] Arrival date & time: 07/02/19  2300     History Chief Complaint  Patient presents with  . Shortness of Breath    Amy Mendoza is a 38 y.o. female with a past medical history of asthma, hyperlipidemia, who presents today for evaluation of shortness of breath. She reports that for approximately 1 week she has had shortness of breath, cough, and back pains.  She has intermittently had fevers during this.  She reports that she had 2 days of nausea, vomiting, and diarrhea early on in the course.  She has not tried taking any albuterol.  She does report that she intermittently feels like she is about to get charley horses with cramping in her bilateral calfs.    She reports feeling slightly better after the Tylenol that she was given in triage.  She does not normally wear any oxygen.  She states that her asthma is very mild.  Her cough is occasionally productive.    She reports back pain and feeling like she has abdominal cramping any time she moves.  She has  IUD in place.  No dysuria, increased frequency or urgency.     HPI     Past Medical History:  Diagnosis Date  . Asthma   . Gallstones   . Hyperlipemia     Patient Active Problem List   Diagnosis Date Noted  . Routine general medical examination at a health care facility 01/18/2017    Past Surgical History:  Procedure Laterality Date  . CHOLECYSTECTOMY  feb 2006  . KNEE SURGERY     left     OB History   No obstetric history on file.     Family History  Problem Relation Age of Onset  . Diabetes Father        MGM, maternal uncle  . Prostate cancer Maternal Grandfather   . Breast cancer Maternal Aunt   . Colon cancer Neg Hx     Social History   Tobacco Use  . Smoking status: Never Smoker  . Smokeless tobacco: Never Used  Substance Use Topics  . Alcohol use: Yes    Comment: occasionally  . Drug use: Yes    Types:  Marijuana    Home Medications Prior to Admission medications   Medication Sig Start Date End Date Taking? Authorizing Provider  doxycycline (VIBRAMYCIN) 100 MG capsule Take 1 capsule (100 mg total) by mouth 2 (two) times daily. 04/15/19   Loura Halt A, NP  fluconazole (DIFLUCAN) 150 MG tablet Take 1 tablet (150 mg total) by mouth daily. Take second dose 72 hours later if symptoms still persists. 11/09/18   Ok Edwards, PA-C  levonorgestrel (MIRENA) 20 MCG/24HR IUD 1 each by Intrauterine route once.    [provider]  potassium chloride SA (K-DUR) 20 MEQ tablet Take 1 tablet (20 mEq total) by mouth 2 (two) times daily for 2 days. 11/09/18 11/11/18  Gareth Morgan, MD  traMADol (ULTRAM) 50 MG tablet Take 1 tablet (50 mg total) by mouth every 6 (six) hours as needed. 04/15/19   Bast, Tressia Miners A, NP  fluticasone (FLONASE) 50 MCG/ACT nasal spray Place 1 spray into both nostrils daily. Patient not taking: Reported on 11/09/2018 07/01/18 04/15/19  Ashley Murrain, NP    Allergies    Patient has no known allergies.  Review of Systems   Review of Systems  Constitutional: Positive for chills, fatigue and fever.  HENT: Negative  for congestion.   Respiratory: Positive for cough, chest tightness and shortness of breath.   Cardiovascular: Positive for chest pain.  Gastrointestinal: Positive for abdominal pain, nausea and vomiting.  Musculoskeletal: Positive for back pain. Negative for neck pain.  Skin: Negative for color change and rash.  Neurological: Negative for weakness and headaches.  All other systems reviewed and are negative.   Physical Exam Updated Vital Signs BP 104/63   Pulse 94   Temp 98.7 F (37.1 C) (Oral)   Resp (!) 25   Ht 5\' 9"  (1.753 m)   Wt 129.3 kg   SpO2 97%   BMI 42.09 kg/m   Physical Exam Vitals and nursing note reviewed.  Constitutional:      General: She is not in acute distress.    Appearance: She is well-developed.  HENT:     Head: Normocephalic and  atraumatic.  Eyes:     Conjunctiva/sclera: Conjunctivae normal.  Cardiovascular:     Rate and Rhythm: Normal rate and regular rhythm.     Heart sounds: No murmur.  Pulmonary:     Effort: Pulmonary effort is normal. Tachypnea present. No respiratory distress.     Breath sounds: Normal breath sounds. No decreased breath sounds or wheezing.  Chest:     Chest wall: No tenderness.  Abdominal:     Palpations: Abdomen is soft.     Tenderness: There is no abdominal tenderness.  Musculoskeletal:     Cervical back: Neck supple.     Right lower leg: No tenderness. No edema.     Left lower leg: No tenderness. No edema.  Skin:    General: Skin is warm and dry.  Neurological:     General: No focal deficit present.     Mental Status: She is alert.  Psychiatric:        Mood and Affect: Mood normal.        Behavior: Behavior normal.     ED Results / Procedures / Treatments   Labs (all labs ordered are listed, but only abnormal results are displayed) Labs Reviewed  BASIC METABOLIC PANEL - Abnormal; Notable for the following components:      Result Value   Glucose, Bld 128 (*)    BUN 5 (*)    Calcium 8.3 (*)    All other components within normal limits  HEPATIC FUNCTION PANEL - Abnormal; Notable for the following components:   Albumin 3.1 (*)    All other components within normal limits  D-DIMER, QUANTITATIVE (NOT AT [facility]) - Abnormal; Notable for the following components:   D-Dimer, Quant 1.14 (*)    All other components within normal limits  LACTATE DEHYDROGENASE - Abnormal; Notable for the following components:   LDH 269 (*)    All other components within normal limits  FIBRINOGEN - Abnormal; Notable for the following components:   Fibrinogen 546 (*)    All other components within normal limits  C-REACTIVE PROTEIN - Abnormal; Notable for the following components:   CRP 9.6 (*)    All other components within normal limits  CULTURE, BLOOD (ROUTINE X 2)  CULTURE, BLOOD (ROUTINE X  2)  SARS CORONAVIRUS 2 (TAT 6-24 HRS)  CBC  LACTIC ACID, PLASMA  LACTIC ACID, PLASMA  PROCALCITONIN  FERRITIN  TRIGLYCERIDES  LIPASE, BLOOD  I-STAT BETA HCG BLOOD, ED ([facility], [facility], [facility] ONLY)  POC SARS CORONAVIRUS 2 AG -  ED  TROPONIN I (HIGH SENSITIVITY)  TROPONIN I (HIGH SENSITIVITY)    EKG None  Radiology  DG Chest Port 1 View  Result Date: 07/03/2019 CLINICAL DATA:  Shortness of breath EXAM: PORTABLE CHEST 1 VIEW COMPARISON:  February 02, 2019 FINDINGS: There are multifocal ground-glass airspace opacities bilaterally. There is no pneumothorax. No large pleural effusion. The heart size is borderline enlarged. The lung volumes are low. IMPRESSION: Multifocal airspace opacities bilaterally concerning for multifocal pneumonia (viral or bacterial). Electronically Signed   By: Katherine Mantle M.D.   On: 07/03/2019 02:32    Procedures .Critical Care Performed by: Cristina Gong, PA-C Authorized by: Cristina Gong, PA-C   Critical care provider statement:    Critical care time (minutes):  45   Critical care was necessary to treat or prevent imminent or life-threatening deterioration of the following conditions:  Respiratory failure   Critical care was time spent personally by me on the following activities:  Discussions with consultants, evaluation of patient's response to treatment, examination of patient, ordering and performing treatments and interventions, ordering and review of laboratory studies, ordering and review of radiographic studies, pulse oximetry, re-evaluation of patient's condition, obtaining history from patient or surrogate and review of old charts   (including critical care time)  Medications Ordered in ED Medications  sodium chloride flush (NS) 0.9 % injection 3 mL (has no administration in time range)  acetaminophen (TYLENOL) tablet 1,000 mg (1,000 mg Oral Given 07/02/19 2322)  dexamethasone (DECADRON) injection 6 mg (6 mg Intravenous Given 07/03/19 0244)     ED Course  I have reviewed the triage vital signs and the nursing notes.  Pertinent labs & imaging results that were available during my care of the patient were reviewed by me and considered in my medical decision making (see chart for details).    MDM Rules/Calculators/A&P                      Amy Mendoza presents today for evaluation of shortness of breath, cough, back pain, and generally feeling unwell for approximately 1 week.  She has multiple family members who tested positive for coronavirus.  On arrival she was febrile at 102.3 and tachycardic at 137.  She became hypoxic while in the emergency room and desaturated to 88%.  She was placed on oxygen.  Covid antigen test was negative.  CBC is unremarkable.  BMP without significant electrolyte derangements.  D-dimer is elevated at 1.14.  CRP elevated at 9.6.  LDH is elevated at 269.  Fibrinogen 546.  Hepatic function panel without significant abnormalities.  Procalcitonin is not elevated, do not suspect a secondary bacterial infection at this time.  She is treated with 6 mg Decadron IV, and p.o. Tylenol.  Suspected Covid admission order set was used.  Additional Covid test ordered.  Based on her hypoxia, continued tachycardia, along with her Mirena increase concern for PE especially in the setting of Covid.   CTA PE study ordered.   At shift change care was transferred to [facility] PA-C who will follow pending studies, re-evaulate and determine disposition.    Anticipate admission for hypoxia with suspected covid-19 admission.    Amy Mendoza was evaluated in Emergency Department on 07/03/2019 for the symptoms described in the history of present illness. She was evaluated in the context of the global COVID-19 pandemic, which necessitated consideration that the patient might be at risk for infection with the SARS-CoV-2 virus that causes COVID-19. Institutional protocols and algorithms that pertain to the  evaluation of patients at risk for COVID-19 are in a state of  rapid change based on information released by regulatory bodies including the CDC and federal and state organizations. These policies and algorithms were followed during the patient's care in the ED.   Final Clinical Impression(s) / ED Diagnoses Final diagnoses:  Suspected COVID-19 virus infection  Hypoxia  Shortness of breath    Rx / DC Orders ED Discharge Orders    None       Cristina Gong, PA-C 07/03/19 1610    Nicanor Alcon, April, MD 07/03/19 [phone]

## 2019-07-03 NOTE — ED Notes (Signed)
Lunch Tray Ordered @ 1117. 

## 2019-07-03 NOTE — ED Provider Notes (Addendum)
Care handoff received from Lyndel Safe, PA-C at shift change please see her note for full details.  In short 38 year old female history of asthma and hyperlipidemia presents today for shortness of breath.  Patient tachycardic, hypoxic and febrile on arrival.  Multiple family members positive for COVID-19.  Plan of care is to await CT angio PE study and then call for admission.  Lactic 0.8 Lipase within normal limits Rapid Covid negative, suspect false negative Troponin: 2 Beta-hCG negative Procalcitonin within normal limits Ferritin within normal limits  D-dimer 1.14 BMP nonacute Triglycerides within normal limits LFTs nonacute CRP 9.6 LDH 269 Fibrinogen 546 CBC within normal limits CXR:  IMPRESSION:  Multifocal airspace opacities bilaterally concerning for multifocal  pneumonia (viral or bacterial).   EKG: Sinus tachycardia Septal infarct , age undetermined Abnormal ECG Since last tracing rate faster Otherwise no significant change Confirmed by Melene Plan [id number]) on 07/03/2019 7:17:12 AM - Patient has received 6 mg Decadron, 1 g Tylenol. Physical Exam  BP 104/63   Pulse 94   Temp 98.7 F (37.1 C) (Oral)   Resp (!) 25   Ht 5\' 9"  (1.753 m)   Wt 129.3 kg   SpO2 97%   BMI 42.09 kg/m   Physical Exam Constitutional:      General: She is not in acute distress.    Appearance: Normal appearance. She is well-developed. She is not ill-appearing or diaphoretic.  HENT:     Head: Normocephalic and atraumatic.     Right Ear: External ear normal.     Left Ear: External ear normal.     Nose: Nose normal.  Eyes:     General: Vision grossly intact. Gaze aligned appropriately.     Pupils: Pupils are equal, round, and reactive to light.  Neck:     Trachea: Trachea and phonation normal. No tracheal deviation.  Pulmonary:     Effort: Pulmonary effort is normal. No respiratory distress.  Musculoskeletal:        General: Normal range of motion.     Cervical back: Normal  range of motion.  Skin:    General: Skin is warm and dry.  Neurological:     Mental Status: She is alert.     GCS: GCS eye subscore is 4. GCS verbal subscore is 5. GCS motor subscore is 6.     Comments: Speech is clear and goal oriented, follows commands Major Cranial nerves without deficit, no facial droop Moves extremities without ataxia, coordination intact  Psychiatric:        Behavior: Behavior normal.     ED Course/Procedures   Clinical Course as of Jul 03 903  Thu Jul 03, 2019  Jul 05, 2019 Dr. [id number]   [BM]    Clinical Course User Index [BM] Katrinka Blazing, PA-C    Procedures  MDM  CT angio PE Study:  IMPRESSION:  1. No demonstrable pulmonary embolus. No thoracic aortic aneurysm or  dissection.    2. Extensive multifocal pneumonia bilaterally with the greatest  concentration of pneumonia in the lower lobes and right middle lobe  and also fairly extensive pneumonitis in each upper lobe.    3. Prominent subcarinal and pretracheal lymph nodes which may well  have reactive etiology given the extent of parenchymal lung  abnormality.   Suspect patient symptoms today secondary to COVID-19 viral infection, lower suspicion for bacterial etiology necessitating antibiotics at this point, will defer antibiotic choices if necessary to hospitalist service.  Patient reevaluated resting comfortably on supplemental oxygen no acute distress  states understanding of work-up and care plan and is agreeable for admission. - 8:46 AM: Discussed case with Dr. Tamala Julian from hospitalist service will be seeing patient for admission.  MAY MANRIQUE was evaluated in Emergency Department on 07/03/2019 for the symptoms described in the history of present illness. She was evaluated in the context of the global COVID-19 pandemic, which necessitated consideration that the patient might be at risk for infection with the SARS-CoV-2 virus that causes COVID-19. Institutional protocols and algorithms that  pertain to the evaluation of patients at risk for COVID-19 are in a state of rapid change based on information released by regulatory bodies including the CDC and federal and state organizations. These policies and algorithms were followed during the patient's care in the ED.  Note: Portions of this report may have been transcribed using voice recognition software. Every effort was made to ensure accuracy; however, inadvertent computerized transcription errors may still be present.    Deliah Boston, PA-C 07/03/19 7408    Lennice Sites, DO 07/03/19 1554

## 2019-07-03 NOTE — ED Notes (Signed)
Dinner Tray Ordered @ 1819. 

## 2019-07-04 ENCOUNTER — Encounter (HOSPITAL_COMMUNITY): Payer: Self-pay | Admitting: Internal Medicine

## 2019-07-04 ENCOUNTER — Inpatient Hospital Stay (HOSPITAL_COMMUNITY): Payer: BLUE CROSS/BLUE SHIELD

## 2019-07-04 DIAGNOSIS — J1289 Other viral pneumonia: Secondary | ICD-10-CM

## 2019-07-04 DIAGNOSIS — U071 COVID-19: Secondary | ICD-10-CM

## 2019-07-04 LAB — COMPREHENSIVE METABOLIC PANEL
ALT: 16 U/L (ref 0–44)
AST: 25 U/L (ref 15–41)
Albumin: 2.8 g/dL — ABNORMAL LOW (ref 3.5–5.0)
Alkaline Phosphatase: 42 U/L (ref 38–126)
Anion gap: 11 (ref 5–15)
BUN: 6 mg/dL (ref 6–20)
CO2: 23 mmol/L (ref 22–32)
Calcium: 7.9 mg/dL — ABNORMAL LOW (ref 8.9–10.3)
Chloride: 105 mmol/L (ref 98–111)
Creatinine, Ser: 0.71 mg/dL (ref 0.44–1.00)
GFR calc Af Amer: >60 mL/min (ref >60)
GFR calc non Af Amer: >60 mL/min (ref >60)
Glucose, Bld: 107 mg/dL — ABNORMAL HIGH (ref 70–99)
Potassium: 3.9 mmol/L (ref 3.5–5.1)
Sodium: 139 mmol/L (ref 135–145)
Total Bilirubin: 0.5 mg/dL (ref 0.3–1.2)
Total Protein: 7 g/dL (ref 6.5–8.1)

## 2019-07-04 LAB — C-REACTIVE PROTEIN
CRP: 6.3 mg/dL — ABNORMAL HIGH (ref <1.0)
CRP: 6.3 mg/dL — ABNORMAL HIGH (ref <1.0)

## 2019-07-04 LAB — CBC WITH DIFFERENTIAL/PLATELET
Abs Immature Granulocytes: 0.04 10*3/uL (ref 0.00–0.07)
Basophils Absolute: 0 10*3/uL (ref 0.0–0.1)
Basophils Relative: 0 %
Eosinophils Absolute: 0 10*3/uL (ref 0.0–0.5)
Eosinophils Relative: 0 %
HCT: 39.4 % (ref 36.0–46.0)
Hemoglobin: 12.6 g/dL (ref 12.0–15.0)
Immature Granulocytes: 1 %
Lymphocytes Relative: 16 %
Lymphs Abs: 1 10*3/uL (ref 0.7–4.0)
MCH: 27.9 pg (ref 26.0–34.0)
MCHC: 32 g/dL (ref 30.0–36.0)
MCV: 87.2 fL (ref 80.0–100.0)
Monocytes Absolute: 0.3 10*3/uL (ref 0.1–1.0)
Monocytes Relative: 4 %
Neutro Abs: 5.2 10*3/uL (ref 1.7–7.7)
Neutrophils Relative %: 79 %
Platelets: 186 10*3/uL (ref 150–400)
RBC: 4.52 MIL/uL (ref 3.87–5.11)
RDW: 13.4 % (ref 11.5–15.5)
WBC: 6.6 10*3/uL (ref 4.0–10.5)
nRBC: 0 % (ref 0.0–0.2)

## 2019-07-04 LAB — D-DIMER, QUANTITATIVE
D-Dimer, Quant: 0.78 ug/mL-FEU — ABNORMAL HIGH (ref 0.00–0.50)
D-Dimer, Quant: 0.84 ug/mL-FEU — ABNORMAL HIGH (ref 0.00–0.50)

## 2019-07-04 LAB — FERRITIN
Ferritin: 71 ng/mL (ref 11–307)
Ferritin: 66 ng/mL (ref 11–307)

## 2019-07-04 LAB — PHOSPHORUS: Phosphorus: 2.4 mg/dL — ABNORMAL LOW (ref 2.5–4.6)

## 2019-07-04 LAB — PROCALCITONIN: Procalcitonin: 0.21 ng/mL

## 2019-07-04 LAB — MAGNESIUM: Magnesium: 1.8 mg/dL (ref 1.7–2.4)

## 2019-07-04 MED ORDER — ENOXAPARIN SODIUM 60 MG/0.6ML ~~LOC~~ SOLN
60.0000 mg | SUBCUTANEOUS | Status: DC
  Administered 2019-07-04 – 2019-07-08 (×5): 60 mg via SUBCUTANEOUS
  Filled 2019-07-04 (×6): qty 0.6

## 2019-07-04 NOTE — ED Notes (Signed)
ptar called tranfer to [facility] at [phone]

## 2019-07-04 NOTE — ED Notes (Signed)
Messaged admitting in regards to pt's heart rate.

## 2019-07-04 NOTE — ED Notes (Signed)
+  tele  Breakfast ordered 

## 2019-07-04 NOTE — ED Notes (Signed)
Lunch Tray Ordered @ 1045 

## 2019-07-04 NOTE — ED Notes (Signed)
Patient has breakfast tray at bedside.  

## 2019-07-04 NOTE — Progress Notes (Signed)
Hospitalist daily note   Amy Mendoza [id number] DOB: 1981-07-15 DOA: 07/02/2019  PCP: System, Provider Not In   Narrative:  89 blk fem lap chole 2006 BMI 42 asthma Multiple family exposures with coronavirus-symptoms started 1 week prior T-max 101.5 O2 sats 88% on 4 L oxygen-point-of-care testing positive for: Tylenol Decadron given Data Reviewed:  BUN/creatinine 6/0.7-phosphorus 2.4 Ferritin 71 CRP 6.3 Lactic acid 0.8 D-dimer 0.7 WBC 6 Assessment & Plan: Coronavirus 19 pneumonia Continue Decadron 6 mg through 12/27 Continue remdesivir until 12/21 cycle daily markers She is requiring about 5 L of oxygen at this time when I tried to down titrate to 2 to 3 L she became a little more tachypneic into the 35 range-we will attempt to transfer her to [place] Because of multifocal pneumonia continue azithromycin and ceftriaxone but get procalcitonin in the morning to guide therapy and may be able to de-escalate to oral meds-repeat CXR in a.m. Asthma  Not on meds prior to admission Continue albuterol 2 puffs every 6, Tussionex and Robitussin as needed BMI >40-habitus for OSA Needs outpatient management and monitoring Outpatient screening for sleep study potentially? Alternating constipation diarrhea  No family, Lovenox prophylactic dose inpatient transfer to [place] this am if bed  Subjective: Awake alert coherent but feels tired No fever no chills No diarrhea none Consultants:   none Procedures:   no Antimicrobials:   no    Objective: Vitals:   07/04/19 0617 07/04/19 0624 07/04/19 0630 07/04/19 0700  BP: 110/72  115/80 116/76  Pulse: 100 97 91 89  Resp:  (!) 31 (!) 37 (!) 39  Temp:      TempSrc:      SpO2: 93% 94% 95% 94%  Weight:      Height:        Intake/Output Summary (Last 24 hours) at 07/04/2019 0729 Last data filed at 07/03/2019 2150 Gross per 24 hour  Intake 350 ml  Output --  Net 350 ml   Filed Weights   07/02/19 2325   Weight: 129.3 kg    Examination: Obese pleasant no distress Mallampati 2 EOMI NCAT no focal deficit Decreased air entry right lower lung but no rales crackles S1-S2 no murmur rub or gallop Abdomen soft nontender no rebound no guarding Neurologically intact no focal deficit  Scheduled Meds: . albuterol  2 puff Inhalation Q6H  . vitamin C  500 mg Oral Daily  . dexamethasone (DECADRON) injection  6 mg Intravenous Q24H  . enoxaparin (LOVENOX) injection  40 mg Subcutaneous Q24H  . famotidine  20 mg Oral BID  . sodium chloride flush  3 mL Intravenous Once  . sodium chloride flush  3 mL Intravenous Q12H  . zinc sulfate  220 mg Oral Daily   Continuous Infusions: . azithromycin 500 mg (07/03/19 1510)  . cefTRIAXone (ROCEPHIN)  IV Stopped (07/03/19 1510)  . remdesivir 100 mg in NS 100 mL       LOS: 1 day   Time spent: [place], MD [facility]

## 2019-07-04 NOTE — Progress Notes (Signed)
Back and chest areas from coughing

## 2019-07-05 DIAGNOSIS — J9601 Acute respiratory failure with hypoxia: Secondary | ICD-10-CM

## 2019-07-05 NOTE — Plan of Care (Signed)
[name]. VSS, SpO2 >88% on 5LNC. Dyspnea w/ exertion. Utilizing purewick   Remained prone for most of shift d/t comfort   Will continue  to monitor & report to oncoming shift  Amy Mendoza A [name]    Problem: Education: Goal: Ability to state activities that reduce stress will improve Outcome: Progressing   Problem: Coping: Goal: Ability to identify and develop effective coping behavior will improve Outcome: Progressing   Problem: Self-Concept: Goal: Ability to identify factors that promote anxiety will improve Outcome: Progressing Goal: Level of anxiety will decrease Outcome: Progressing Goal: Ability to modify response to factors that promote anxiety will improve Outcome: Progressing   Problem: Education: Goal: Knowledge of risk factors and measures for prevention of condition will improve Outcome: Progressing   Problem: Coping: Goal: Psychosocial and spiritual needs will be supported Outcome: Progressing   Problem: Respiratory: Goal: Will maintain a patent airway Outcome: Progressing Goal: Complications related to the disease process, condition or treatment will be avoided or minimized Outcome: Progressing

## 2019-07-05 NOTE — Progress Notes (Signed)
[facility] PROGRESS NOTE    Progress Note  Amy Mendoza  BJY:[id number] DOB: 1981/03/24 DOA: 07/02/2019 PCP: System, Provider Not In     Brief Narrative:   Amy Mendoza is an 38 y.o. female past medical history of asthma and hyperlipidemia comes in complaining of progressive cough and shortness of breath that started 3 days prior to admission.  She relates she started having fevers about a week ago followed by nonbloody diarrhea she has had multiple family members tested positive for COVID-19, she underwent screening 3 days prior to admission and she was negative.  She continues to have intermittent productive cough, came into the ED she was found to be with a heart rate of 137 with breathing 28 times per minute and satting 88% on room air placed on 4 L of oxygen and saturations improved.  Transferred to [facility]  Assessment/Plan:   Acute respiratory failure secondary to Pneumonia due to COVID-19 virus She is currently crying 6 L of oxygen to keep saturations greater than 92% Se was started on admission on IV remdesivir, steroids vitamin C and zinc. She does not have a white count with neutrophilia and her procalcitonin was less than 0.2, will discontinue empiric antibiotics. Inflammatory markers are improving. Try to keep the patient prone for early 16 hours a day, when not prone out of bed to chair.  Mild intermittent asthma Currently stable no signs of wheezing on physical exam. Continue inhalers.   DVT prophylaxis: lovenox Family Communication:none Disposition Plan/Barrier to D/C: unable to determine Code Status:     Code Status Orders  (From admission, onward)         Start     Ordered   07/03/19 0854  Full code  Continuous     07/03/19 0857        Code Status History    This patient has a current code status but no historical code status.   Advance Care Planning Activity        IV Access:    Peripheral IV   Procedures and  diagnostic studies:   DG CHEST PORT 1 VIEW  Result Date: 07/04/2019 CLINICAL DATA:  Pneumonia EXAM: PORTABLE CHEST 1 VIEW COMPARISON:  07/03/2019 FINDINGS: Worsening bilateral airspace disease compatible with worsening pneumonia. Low lung volumes, decreasing since prior study. Heart is normal size. No effusions. No acute bony abnormality. IMPRESSION: Worsening aeration and bilateral airspace disease. Electronically Signed   By: Rolm Baptise M.D.   On: 07/04/2019 22:00     Medical Consultants:    None.  Anti-Infectives:   IV remdesivir  Subjective:    Amy Mendoza she relates her breathing is unchanged compared to yesterday.  Objective:    Vitals:   07/05/19 0000 07/05/19 0400 07/05/19 0405 07/05/19 0700  BP: 134/90 125/87  (!) 133/94  Pulse: 99 81 84 76  Resp: (!) 30 (!) 31 (!) 21 (!) 22  Temp:  98.1 F (36.7 C) 98.1 F (36.7 C) 98.1 F (36.7 C)  TempSrc:  Oral Oral Oral  SpO2: 91% 93% 93% (!) 89%  Weight:      Height:       SpO2: (!) 89 % O2 Flow Rate (L/min): 5 L/min   Intake/Output Summary (Last 24 hours) at 07/05/2019 1040 Last data filed at 07/04/2019 2213 Gross per 24 hour  Intake 353 ml  Output 750 ml  Net -397 ml   Filed Weights   07/02/19 2325 07/04/19 2047  Weight: 129.3 kg 125.7  kg    Exam: General exam: In no acute distress. Respiratory system: Good air movement and use crackles mainly on the right. Cardiovascular system: S1 & S2 heard, RRR. No JVD. Gastrointestinal system: Abdomen is nondistended, soft and nontender.  Central nervous system: Alert and oriented. No focal neurological deficits. Extremities: No pedal edema. Skin: No rashes, lesions or ulcers Psychiatry: Judgement and insight appear normal. Mood & affect appropriate.    Data Reviewed:    Labs: Basic Metabolic Panel: Recent Labs  Lab 07/02/19 2327 07/04/19 0610  NA 135 139  K 4.0 3.9  CL 102 105  CO2 25 23  GLUCOSE 128* 107*  BUN 5* 6  CREATININE 0.85 0.71   CALCIUM 8.3* 7.9*  MG  --  1.8  PHOS  --  2.4*   GFR Estimated Creatinine Clearance: 136.8 mL/min (by C-G formula based on SCr of 0.71 mg/dL). Liver Function Tests: Recent Labs  Lab 07/03/19 0127 07/04/19 0610  AST 27 25  ALT 19 16  ALKPHOS 45 42  BILITOT 0.7 0.5  PROT 7.4 7.0  ALBUMIN 3.1* 2.8*   Recent Labs  Lab 07/03/19 0249  LIPASE 26   No results for input(s): AMMONIA in the last 168 hours. Coagulation profile No results for input(s): INR, PROTIME in the last 168 hours. COVID-19 Labs  Recent Labs    07/02/19 2327 07/04/19 0610 07/04/19 0814  DDIMER 1.14* 0.78* 0.84*  FERRITIN 79 71 66  LDH 269*  --   --   CRP 9.6* 6.3* 6.3*    Lab Results  Component Value Date   SARSCOV2NAA POSITIVE (A) 07/03/2019   SARSCOV2NAA Not Detected 06/30/2019   SARSCOV2NAA Not Detected 05/19/2019    CBC: Recent Labs  Lab 07/02/19 2327 07/04/19 0610  WBC 6.3 6.6  NEUTROABS  --  5.2  HGB 13.6 12.6  HCT 42.8 39.4  MCV 86.8 87.2  PLT 171 186   Cardiac Enzymes: No results for input(s): CKTOTAL, CKMB, CKMBINDEX, TROPONINI in the last 168 hours. BNP (last 3 results) No results for input(s): PROBNP in the last 8760 hours. CBG: No results for input(s): GLUCAP in the last 168 hours. D-Dimer: Recent Labs    07/04/19 0610 07/04/19 0814  DDIMER 0.78* 0.84*   Hgb A1c: No results for input(s): HGBA1C in the last 72 hours. Lipid Profile: Recent Labs    07/03/19 0127  TRIG 51   Thyroid function studies: No results for input(s): TSH, T4TOTAL, T3FREE, THYROIDAB in the last 72 hours.  Invalid input(s): FREET3 Anemia work up: Recent Labs    07/04/19 0610 07/04/19 0814  FERRITIN 71 66   Sepsis Labs: Recent Labs  Lab 07/02/19 2327 07/03/19 0242 07/03/19 0518 07/04/19 0610 07/04/19 0814  PROCALCITON 0.23  --   --   --  0.21  WBC 6.3  --   --  6.6  --   LATICACIDVEN  --  0.8 0.8  --   --    Microbiology Recent Results (from the past 240 hour(s))  Novel  Coronavirus, NAA ([facility])     Status: None   Collection Time: 06/30/19 12:00 AM   Specimen: Nasopharyngeal(NP) swabs in vial transport medium   NASOPHARYNGE  TESTING  Result Value Ref Range Status   SARS-CoV-2, NAA Not Detected Not Detected Final    Comment: This nucleic acid amplification test was developed and its performance characteristics determined by [facility]. Nucleic acid amplification tests include PCR and TMA. This test has not been FDA cleared or approved. This test  has been authorized by FDA under an Emergency Use Authorization (EUA). This test is only authorized for the duration of time the declaration that circumstances exist justifying the authorization of the emergency use of in vitro diagnostic tests for detection of SARS-CoV-2 virus and/or diagnosis of COVID-19 infection under section 564(b)(1) of the Act, 21 U.S.C. 295MWU-1(L) (1), unless the authorization is terminated or revoked sooner. When diagnostic testing is negative, the possibility of a false negative result should be considered in the context of a patient's recent exposures and the presence of clinical signs and symptoms consistent with COVID-19. An individual without symptoms of COVID-19 and who is not shedding SARS-CoV-2 virus would  expect to have a negative (not detected) result in this assay.   Blood Culture (routine x 2)     Status: None (Preliminary result)   Collection Time: 07/03/19  2:42 AM   Specimen: BLOOD  Result Value Ref Range Status   Specimen Description BLOOD RIGHT ARM  Final   Special Requests   Final    BOTTLES DRAWN AEROBIC AND ANAEROBIC Blood Culture results may not be optimal due to an inadequate volume of blood received in culture bottles   Culture   Final    NO GROWTH 1 DAY Performed at [facility], [address] [place], [place] [address]    Report Status PENDING  Incomplete  SARS CORONAVIRUS 2 (TAT 6-24 HRS) Nasopharyngeal Nasopharyngeal Swab     Status:  Abnormal   Collection Time: 07/03/19  3:45 AM   Specimen: Nasopharyngeal Swab  Result Value Ref Range Status   SARS Coronavirus 2 POSITIVE (A) NEGATIVE Final    Comment: RESULT CALLED TO, READ BACK BY AND VERIFIED WITH: H.HARDY,RN [id number] I.MANNING (NOTE) SARS-CoV-2 target nucleic acids are DETECTED. The SARS-CoV-2 RNA is generally detectable in upper and lower respiratory specimens during the acute phase of infection. Positive results are indicative of the presence of SARS-CoV-2 RNA. Clinical correlation with patient history and other diagnostic information is  necessary to determine patient infection status. Positive results do not rule out bacterial infection or co-infection with other viruses.  The expected result is Negative. Fact Sheet for Patients: HairSlick.no Fact Sheet for Healthcare Providers: quierodirigir.com This test is not yet approved or cleared by the [place] FDA and  has been authorized for detection and/or diagnosis of SARS-CoV-2 by FDA under an Emergency Use Authorization (EUA). This EUA will remain  in effect (meaning this test can be used) for the  duration of the COVID-19 declaration under Section 564(b)(1) of the Act, 21 U.S.C. section 360bbb-3(b)(1), unless the authorization is terminated or revoked sooner. Performed at [facility], [address], [place], [place] [address]   Blood Culture (routine x 2)     Status: None (Preliminary result)   Collection Time: 07/03/19  5:03 AM   Specimen: BLOOD  Result Value Ref Range Status   Specimen Description BLOOD RIGHT HAND  Final   Special Requests   Final    BOTTLES DRAWN AEROBIC AND ANAEROBIC Blood Culture results may not be optimal due to an inadequate volume of blood received in culture bottles   Culture   Final    NO GROWTH 1 DAY Performed at [facility], [address], [place], [place] [address]    Report Status PENDING   Incomplete     Medications:   . albuterol  2 puff Inhalation Q6H  . vitamin C  500 mg Oral Daily  . dexamethasone (DECADRON)  injection  6 mg Intravenous Q24H  . enoxaparin (LOVENOX) injection  60 mg Subcutaneous Q24H  . famotidine  20 mg Oral BID  . sodium chloride flush  3 mL Intravenous Once  . sodium chloride flush  3 mL Intravenous Q12H  . zinc sulfate  220 mg Oral Daily   Continuous Infusions: . remdesivir 100 mg in NS 100 mL 100 mg (07/05/19 1011)      LOS: 2 days   Marinda Elk  [facility]  07/05/2019, 10:40 AM

## 2019-07-05 NOTE — Plan of Care (Signed)
  Problem: Education: Goal: Ability to state activities that reduce stress will improve Outcome: Progressing   Problem: Coping: Goal: Ability to identify and develop effective coping behavior will improve Outcome: Progressing   Problem: Self-Concept: Goal: Ability to identify factors that promote anxiety will improve Outcome: Progressing Goal: Level of anxiety will decrease Outcome: Progressing Goal: Ability to modify response to factors that promote anxiety will improve Outcome: Progressing   Problem: Education: Goal: Knowledge of risk factors and measures for prevention of condition will improve Outcome: Progressing   Problem: Coping: Goal: Psychosocial and spiritual needs will be supported Outcome: Progressing   Problem: Respiratory: Goal: Will maintain a patent airway Outcome: Progressing Goal: Complications related to the disease process, condition or treatment will be avoided or minimized Outcome: Progressing   

## 2019-07-06 LAB — COMPREHENSIVE METABOLIC PANEL
ALT: 20 U/L (ref 0–44)
AST: 23 U/L (ref 15–41)
Albumin: 2.9 g/dL — ABNORMAL LOW (ref 3.5–5.0)
Alkaline Phosphatase: 46 U/L (ref 38–126)
Anion gap: 10 (ref 5–15)
BUN: 16 mg/dL (ref 6–20)
CO2: 26 mmol/L (ref 22–32)
Calcium: 8.7 mg/dL — ABNORMAL LOW (ref 8.9–10.3)
Chloride: 105 mmol/L (ref 98–111)
Creatinine, Ser: 0.79 mg/dL (ref 0.44–1.00)
GFR calc Af Amer: >60 mL/min (ref >60)
GFR calc non Af Amer: >60 mL/min (ref >60)
Glucose, Bld: 97 mg/dL (ref 70–99)
Potassium: 4.3 mmol/L (ref 3.5–5.1)
Sodium: 141 mmol/L (ref 135–145)
Total Bilirubin: 0.4 mg/dL (ref 0.3–1.2)
Total Protein: 7 g/dL (ref 6.5–8.1)

## 2019-07-06 LAB — CBC
HCT: 41.5 % (ref 36.0–46.0)
Hemoglobin: 12.9 g/dL (ref 12.0–15.0)
MCH: 27.2 pg (ref 26.0–34.0)
MCHC: 31.1 g/dL (ref 30.0–36.0)
MCV: 87.6 fL (ref 80.0–100.0)
Platelets: 288 10*3/uL (ref 150–400)
RBC: 4.74 MIL/uL (ref 3.87–5.11)
RDW: 13.6 % (ref 11.5–15.5)
WBC: 6.9 10*3/uL (ref 4.0–10.5)
nRBC: 0 % (ref 0.0–0.2)

## 2019-07-06 LAB — D-DIMER, QUANTITATIVE: D-Dimer, Quant: 0.5 ug/mL-FEU (ref 0.00–0.50)

## 2019-07-06 LAB — ABO/RH: ABO/RH(D): O POS

## 2019-07-06 LAB — FERRITIN: Ferritin: 47 ng/mL (ref 11–307)

## 2019-07-06 LAB — PROCALCITONIN: Procalcitonin: <0.1 ng/mL

## 2019-07-06 LAB — C-REACTIVE PROTEIN: CRP: 1.1 mg/dL — ABNORMAL HIGH (ref <1.0)

## 2019-07-06 LAB — PHOSPHORUS: Phosphorus: 2.7 mg/dL (ref 2.5–4.6)

## 2019-07-06 LAB — MAGNESIUM: Magnesium: 2 mg/dL (ref 1.7–2.4)

## 2019-07-06 NOTE — Progress Notes (Signed)
[facility] PROGRESS NOTE    Progress Note  Amy Mendoza  ZOX:[id number] DOB: 04/27/1981 DOA: 07/02/2019 PCP: System, Provider Not In     Brief Narrative:   Amy Mendoza is an 38 y.o. female past medical history of asthma and hyperlipidemia comes in complaining of progressive cough and shortness of breath that started 3 days prior to admission.  She relates she started having fevers about a week ago followed by nonbloody diarrhea she has had multiple family members tested positive for COVID-19, she underwent screening 3 days prior to admission and she was negative.  She continues to have intermittent productive cough, came into the ED she was found to be with a heart rate of 137 with breathing 28 times per minute and satting 88% on room air placed on 4 L of oxygen and saturations improved.  [facility]  Assessment/Plan:   Acute respiratory failure secondary to Pneumonia due to COVID-19 virus Her oxygen requirements are improving, she is currently on 3 L of oxygen to keep saturation greater than 94%, wean to room air, she is doing a great job proning. Continue IV remdesivir, steroids vitamin C and zinc. Laboratory markers have drastically improved. Try to keep the patient peripherally 16 hours a day, would not prone out of bed to chair.  Mild intermittent asthma Currently stable no signs of wheezing on physical exam. Continue inhalers.   DVT prophylaxis: lovenox Family Communication:none Disposition Plan/Barrier to D/C: unable to determine Code Status:     Code Status Orders  (From admission, onward)         Start     Ordered   07/03/19 0854  Full code  Continuous     07/03/19 0857        Code Status History    This patient has a current code status but no historical code status.   Advance Care Planning Activity        IV Access:    Peripheral IV   Procedures and diagnostic studies:   DG CHEST PORT 1 VIEW  Result  Date: 07/04/2019 CLINICAL DATA:  Pneumonia EXAM: PORTABLE CHEST 1 VIEW COMPARISON:  07/03/2019 FINDINGS: Worsening bilateral airspace disease compatible with worsening pneumonia. Low lung volumes, decreasing since prior study. Heart is normal size. No effusions. No acute bony abnormality. IMPRESSION: Worsening aeration and bilateral airspace disease. Electronically Signed   By: Charlett NoseKevin  Dover M.D.   On: 07/04/2019 22:00     Medical Consultants:    None.  Anti-Infectives:   IV remdesivir  Subjective:    Amy Mendoza she relates that she can tell a difference in her breathing, she relates she is breathing more comfortable today, compared to yesterday.  Objective:    Vitals:   07/06/19 0300 07/06/19 0305 07/06/19 0415 07/06/19 0736  BP: 118/67  118/76 128/84  Pulse: (!) 58 (!) 57 (!) 58 84  Resp:  (!) 28 20 (!) 35  Temp:    (!) 97.4 F (36.3 C)  TempSrc:      SpO2: 98% 98% 100%   Weight:      Height:       SpO2: 100 % O2 Flow Rate (L/min): 4 L/min   Intake/Output Summary (Last 24 hours) at 07/06/2019 0800 Last data filed at 07/05/2019 1500 Gross per 24 hour  Intake 1250 ml  Output -  Net 1250 ml   Filed Weights   07/02/19 2325 07/04/19 2047  Weight: 129.3 kg 125.7 kg    Exam:  General exam: In no acute distress. Respiratory system: Good air movement and diffuse crackles bilaterally. Cardiovascular system: S1 & S2 heard, RRR. No JVD. Gastrointestinal system: Abdomen is nondistended, soft and nontender.  Central nervous system: Alert and oriented. No focal neurological deficits. Extremities: No pedal edema. Skin: No rashes, lesions or ulcers Psychiatry: Judgement and insight appear normal. Mood & affect appropriate.    Data Reviewed:    Labs: Basic Metabolic Panel: Recent Labs  Lab 07/02/19 2327 07/04/19 0610 07/06/19 0337  NA 135 139 141  K 4.0 3.9 4.3  CL 102 105 105  CO2 25 23 26   GLUCOSE 128* 107* 97  BUN 5* 6 16  CREATININE 0.85 0.71 0.79   CALCIUM 8.3* 7.9* 8.7*  MG  --  1.8 2.0  PHOS  --  2.4* 2.7   GFR Estimated Creatinine Clearance: 136.8 mL/min (by C-G formula based on SCr of 0.79 mg/dL). Liver Function Tests: Recent Labs  Lab 07/03/19 0127 07/04/19 0610 07/06/19 0337  AST 27 25 23   ALT 19 16 20   ALKPHOS 45 42 46  BILITOT 0.7 0.5 0.4  PROT 7.4 7.0 7.0  ALBUMIN 3.1* 2.8* 2.9*   Recent Labs  Lab 07/03/19 0249  LIPASE 26   No results for input(s): AMMONIA in the last 168 hours. Coagulation profile No results for input(s): INR, PROTIME in the last 168 hours. COVID-19 Labs  Recent Labs    07/04/19 0610 07/04/19 0814 07/06/19 0337  DDIMER 0.78* 0.84* 0.50  FERRITIN 71 66 47  CRP 6.3* 6.3* 1.1*    Lab Results  Component Value Date   SARSCOV2NAA POSITIVE (A) 07/03/2019   SARSCOV2NAA Not Detected 06/30/2019   SARSCOV2NAA Not Detected 05/19/2019    CBC: Recent Labs  Lab 07/02/19 2327 07/04/19 0610 07/06/19 0337  WBC 6.3 6.6 6.9  NEUTROABS  --  5.2  --   HGB 13.6 12.6 12.9  HCT 42.8 39.4 41.5  MCV 86.8 87.2 87.6  PLT 171 186 288   Cardiac Enzymes: No results for input(s): CKTOTAL, CKMB, CKMBINDEX, TROPONINI in the last 168 hours. BNP (last 3 results) No results for input(s): PROBNP in the last 8760 hours. CBG: No results for input(s): GLUCAP in the last 168 hours. D-Dimer: Recent Labs    07/04/19 0814 07/06/19 0337  DDIMER 0.84* 0.50   Hgb A1c: No results for input(s): HGBA1C in the last 72 hours. Lipid Profile: No results for input(s): CHOL, HDL, LDLCALC, TRIG, CHOLHDL, LDLDIRECT in the last 72 hours. Thyroid function studies: No results for input(s): TSH, T4TOTAL, T3FREE, THYROIDAB in the last 72 hours.  Invalid input(s): FREET3 Anemia work up: Recent Labs    07/04/19 0814 07/06/19 0337  FERRITIN 66 47   Sepsis Labs: Recent Labs  Lab 07/02/19 2327 07/03/19 0242 07/03/19 0518 07/04/19 0610 07/04/19 0814 07/06/19 0337  PROCALCITON 0.23  --   --   --  0.21  --    WBC 6.3  --   --  6.6  --  6.9  LATICACIDVEN  --  0.8 0.8  --   --   --    Microbiology Recent Results (from the past 240 hour(s))  Novel Coronavirus, NAA ([facility])     Status: None   Collection Time: 06/30/19 12:00 AM   Specimen: Nasopharyngeal(NP) swabs in vial transport medium   NASOPHARYNGE  TESTING  Result Value Ref Range Status   SARS-CoV-2, NAA Not Detected Not Detected Final    Comment: This nucleic acid amplification test was developed and its  performance characteristics determined by [employer] [facility]. Nucleic acid amplification tests include PCR and TMA. This test has not been FDA cleared or approved. This test has been authorized by FDA under an Emergency Use Authorization (EUA). This test is only authorized for the duration of time the declaration that circumstances exist justifying the authorization of the emergency use of in vitro diagnostic tests for detection of SARS-CoV-2 virus and/or diagnosis of COVID-19 infection under section 564(b)(1) of the Act, 21 U.S.C. 161WRU-0(A) (1), unless the authorization is terminated or revoked sooner. When diagnostic testing is negative, the possibility of a false negative result should be considered in the context of a patient's recent exposures and the presence of clinical signs and symptoms consistent with COVID-19. An individual without symptoms of COVID-19 and who is not shedding SARS-CoV-2 virus would  expect to have a negative (not detected) result in this assay.   Blood Culture (routine x 2)     Status: None (Preliminary result)   Collection Time: 07/03/19  2:42 AM   Specimen: BLOOD  Result Value Ref Range Status   Specimen Description BLOOD RIGHT ARM  Final   Special Requests   Final    BOTTLES DRAWN AEROBIC AND ANAEROBIC Blood Culture results may not be optimal due to an inadequate volume of blood received in culture bottles   Culture   Final    NO GROWTH 2 DAYS Performed at [facility], [address], [place], [place] [address]    Report Status PENDING  Incomplete  SARS CORONAVIRUS 2 (TAT 6-24 HRS) Nasopharyngeal Nasopharyngeal Swab     Status: Abnormal   Collection Time: 07/03/19  3:45 AM   Specimen: Nasopharyngeal Swab  Result Value Ref Range Status   SARS Coronavirus 2 POSITIVE (A) NEGATIVE Final    Comment: RESULT CALLED TO, READ BACK BY AND VERIFIED WITH: H.HARDY,RN [id number] I.MANNING (NOTE) SARS-CoV-2 target nucleic acids are DETECTED. The SARS-CoV-2 RNA is generally detectable in upper and lower respiratory specimens during the acute phase of infection. Positive results are indicative of the presence of SARS-CoV-2 RNA. Clinical correlation with patient history and other diagnostic information is  necessary to determine patient infection status. Positive results do not rule out bacterial infection or co-infection with other viruses.  The expected result is Negative. Fact Sheet for Patients: HairSlick.no Fact Sheet for Healthcare Providers: quierodirigir.com This test is not yet approved or cleared by the [place] FDA and  has been authorized for detection and/or diagnosis of SARS-CoV-2 by FDA under an Emergency Use Authorization (EUA). This EUA will remain  in effect (meaning this test can be used) for the  duration of the COVID-19 declaration under Section 564(b)(1) of the Act, 21 U.S.C. section 360bbb-3(b)(1), unless the authorization is terminated or revoked sooner. Performed at [facility], [address] [place], [place] [address]   Blood Culture (routine x 2)     Status: None (Preliminary result)   Collection Time: 07/03/19  5:03 AM   Specimen: BLOOD  Result Value Ref Range Status   Specimen Description BLOOD RIGHT HAND  Final   Special Requests   Final    BOTTLES DRAWN AEROBIC AND ANAEROBIC Blood Culture results may not be optimal due to an inadequate volume of blood received in culture  bottles   Culture   Final    NO GROWTH 2 DAYS Performed at [facility], [address], [place], [place] [address]    Report Status PENDING  Incomplete  Medications:   . albuterol  2 puff Inhalation Q6H  . vitamin C  500 mg Oral Daily  . dexamethasone (DECADRON) injection  6 mg Intravenous Q24H  . enoxaparin (LOVENOX) injection  60 mg Subcutaneous Q24H  . famotidine  20 mg Oral BID  . sodium chloride flush  3 mL Intravenous Once  . sodium chloride flush  3 mL Intravenous Q12H  . zinc sulfate  220 mg Oral Daily   Continuous Infusions: . remdesivir 100 mg in NS 100 mL Stopped (07/05/19 1105)      LOS: 3 days   Charlynne Cousins  [facility]  07/06/2019, 8:00 AM

## 2019-07-06 NOTE — Progress Notes (Signed)
Pt. Transferred to room 317. Pt. Stable when arrived to room, report given to RN.

## 2019-07-06 NOTE — Progress Notes (Signed)
Pharmacy Consult - Remdesivir  55 yof presenting COVID-19 positive with respiratory symptoms requiring hospitalization. Pharmacy consulted to dose Remdesivir. ALT<220.   Plan: Continue Remdesivir 100mg  IV q24h to complete 5 total doses Monitor clinical progress, ALT  Amy Mendoza, PharmD, BCPS PGY2 Cardiology Pharmacy Resident 07/06/2019       11:14 AM  Please check [email or web address] for unit-specific pharmacist phone numbers

## 2019-07-06 NOTE — Progress Notes (Signed)
Patient transferred to 317 by wheelchair. Pt has no complaints. VSS. Will continue to monitor.

## 2019-07-06 NOTE — Plan of Care (Signed)
  Problem: Education: Goal: Ability to state activities that reduce stress will improve Outcome: Progressing   Problem: Coping: Goal: Ability to identify and develop effective coping behavior will improve Outcome: Progressing   Problem: Self-Concept: Goal: Ability to identify factors that promote anxiety will improve Outcome: Progressing Goal: Level of anxiety will decrease Outcome: Progressing Goal: Ability to modify response to factors that promote anxiety will improve Outcome: Progressing   Problem: Education: Goal: Knowledge of risk factors and measures for prevention of condition will improve Outcome: Progressing   Problem: Coping: Goal: Psychosocial and spiritual needs will be supported Outcome: Progressing   Problem: Respiratory: Goal: Will maintain a patent airway Outcome: Progressing Goal: Complications related to the disease process, condition or treatment will be avoided or minimized Outcome: Progressing   

## 2019-07-06 NOTE — Progress Notes (Signed)
RN updated patient's family on plan of care.

## 2019-07-07 DIAGNOSIS — E66813 Obesity, class 3: Secondary | ICD-10-CM

## 2019-07-07 LAB — COMPREHENSIVE METABOLIC PANEL
ALT: 34 U/L (ref 0–44)
AST: 26 U/L (ref 15–41)
Albumin: 3 g/dL — ABNORMAL LOW (ref 3.5–5.0)
Alkaline Phosphatase: 44 U/L (ref 38–126)
Anion gap: 8 (ref 5–15)
BUN: 17 mg/dL (ref 6–20)
CO2: 27 mmol/L (ref 22–32)
Calcium: 8.7 mg/dL — ABNORMAL LOW (ref 8.9–10.3)
Chloride: 106 mmol/L (ref 98–111)
Creatinine, Ser: 0.79 mg/dL (ref 0.44–1.00)
GFR calc Af Amer: >60 mL/min (ref >60)
GFR calc non Af Amer: >60 mL/min (ref >60)
Glucose, Bld: 116 mg/dL — ABNORMAL HIGH (ref 70–99)
Potassium: 4.3 mmol/L (ref 3.5–5.1)
Sodium: 141 mmol/L (ref 135–145)
Total Bilirubin: 0.3 mg/dL (ref 0.3–1.2)
Total Protein: 7.2 g/dL (ref 6.5–8.1)

## 2019-07-07 LAB — CBC
HCT: 43.4 % (ref 36.0–46.0)
Hemoglobin: 13.7 g/dL (ref 12.0–15.0)
MCH: 27.5 pg (ref 26.0–34.0)
MCHC: 31.6 g/dL (ref 30.0–36.0)
MCV: 87.1 fL (ref 80.0–100.0)
Platelets: 309 10*3/uL (ref 150–400)
RBC: 4.98 MIL/uL (ref 3.87–5.11)
RDW: 13.5 % (ref 11.5–15.5)
WBC: 8.5 10*3/uL (ref 4.0–10.5)
nRBC: 0 % (ref 0.0–0.2)

## 2019-07-07 LAB — PHOSPHORUS: Phosphorus: 3.2 mg/dL (ref 2.5–4.6)

## 2019-07-07 LAB — D-DIMER, QUANTITATIVE: D-Dimer, Quant: 0.52 ug/mL-FEU — ABNORMAL HIGH (ref 0.00–0.50)

## 2019-07-07 LAB — MAGNESIUM: Magnesium: 2 mg/dL (ref 1.7–2.4)

## 2019-07-07 LAB — C-REACTIVE PROTEIN: CRP: 0.7 mg/dL (ref <1.0)

## 2019-07-07 LAB — FERRITIN: Ferritin: 34 ng/mL (ref 11–307)

## 2019-07-07 NOTE — Progress Notes (Addendum)
[facility] PROGRESS NOTE    Progress Note  Amy OgrenLorraine L Nazzaro  WUJ:[id number] DOB: 06/15/1981 DOA: 07/02/2019 PCP: System, Provider Not In     Brief Narrative:   Amy Mendoza is an 38 y.o. female past medical history of asthma and hyperlipidemia comes in complaining of progressive cough and shortness of breath that started 3 days prior to admission.  She relates she started having fevers about a week ago followed by nonbloody diarrhea she has had multiple family members tested positive for COVID-19, she underwent screening 3 days prior to admission and she was negative.  She continues to have intermittent productive cough, came into the ED she was found to be with a heart rate of 137 with breathing 28 times per minute and satting 88% on room air placed on 4 L of oxygen and saturations improved.  [facility]  Assessment/Plan:   Acute respiratory failure secondary to Pneumonia due to COVID-19 virus She is currently requiring 3 L of oxygen to keep saturations greater than 95%, will try to wean down her oxygen she does not need to be 99%. She is doing a great job proning herself. Continue IV remdesivir, steroids vitamin C and zinc. Her inflammatory markers have drastically improved. Try to keep the patient peripherally 16 hours a day, if not prone out of bed to chair.  Mild intermittent asthma Currently stable no signs of wheezing on physical exam. Continue inhalers.  Morbid obesity: She has been counseled about diet and exercise   DVT prophylaxis: lovenox Family Communication:none Disposition Plan/Barrier to D/C: unable to determine Code Status:     Code Status Orders  (From admission, onward)           Start     Ordered   07/03/19 0854  Full code  Continuous     07/03/19 0857           Code Status History     This patient has a current code status but no historical code status.   Advance Care Planning Activity         IV  Access:   Peripheral IV   Procedures and diagnostic studies:   No results found.   Medical Consultants:   None.  Anti-Infectives:   IV remdesivir  Subjective:    Amy OgrenLorraine L Mineer relates that her breathing is better compared to yesterday today.  Objective:    Vitals:   07/06/19 1433 07/06/19 2000 07/07/19 0500 07/07/19 0746  BP: 135/75 121/77  111/73  Pulse: 70 74 70 (!) 52  Resp: (!) 22 18 20 20   Temp: 98.5 F (36.9 C) 98.4 F (36.9 C) 98.1 F (36.7 C) 98.2 F (36.8 C)  TempSrc: Oral Oral Oral Oral  SpO2: 94% 94% 95% 99%  Weight:      Height:       SpO2: 99 % O2 Flow Rate (L/min): 3 L/min   Intake/Output Summary (Last 24 hours) at 07/07/2019 0750 Last data filed at 07/06/2019 2127 Gross per 24 hour  Intake 343 ml  Output 250 ml  Net 93 ml   Filed Weights   07/02/19 2325 07/04/19 2047  Weight: 129.3 kg 125.7 kg    Exam: General exam: In no acute distress. Respiratory system: Good air movement and clear to auscultation. Cardiovascular system: S1 & S2 heard, RRR. No JVD.  Gastrointestinal system: Abdomen is nondistended, soft and nontender.  Central nervous system: Alert and oriented. No focal neurological deficits. Extremities: No pedal edema. Skin: No  rashes, lesions or ulcers Psychiatry: Judgement and insight appear normal. Mood & affect appropriate.    Data Reviewed:    Labs: Basic Metabolic Panel: Recent Labs  Lab 07/02/19 2327 07/04/19 0610 07/06/19 0337 07/07/19 0212  NA 135 139 141 141  K 4.0 3.9 4.3 4.3  CL 102 105 105 106  CO2 25 23 26 27   GLUCOSE 128* 107* 97 116*  BUN 5* 6 16 17   CREATININE 0.85 0.71 0.79 0.79  CALCIUM 8.3* 7.9* 8.7* 8.7*  MG  --  1.8 2.0 2.0  PHOS  --  2.4* 2.7 3.2   GFR Estimated Creatinine Clearance: 136.8 mL/min (by C-G formula based on SCr of 0.79 mg/dL). Liver Function Tests: Recent Labs  Lab 07/03/19 0127 07/04/19 0610 07/06/19 0337 07/07/19 0212  AST 27 25 23 26   ALT 19 16 20  34    ALKPHOS 45 42 46 44  BILITOT 0.7 0.5 0.4 0.3  PROT 7.4 7.0 7.0 7.2  ALBUMIN 3.1* 2.8* 2.9* 3.0*   Recent Labs  Lab 07/03/19 0249  LIPASE 26   No results for input(s): AMMONIA in the last 168 hours. Coagulation profile No results for input(s): INR, PROTIME in the last 168 hours. COVID-19 Labs  Recent Labs    07/04/19 0814 07/06/19 0337 07/07/19 0212  DDIMER 0.84* 0.50 0.52*  FERRITIN 66 47 34  CRP 6.3* 1.1* 0.7    Lab Results  Component Value Date   SARSCOV2NAA POSITIVE (A) 07/03/2019   SARSCOV2NAA Not Detected 06/30/2019   SARSCOV2NAA Not Detected 05/19/2019    CBC: Recent Labs  Lab 07/02/19 2327 07/04/19 0610 07/06/19 0337 07/07/19 0212  WBC 6.3 6.6 6.9 8.5  NEUTROABS  --  5.2  --   --   HGB 13.6 12.6 12.9 13.7  HCT 42.8 39.4 41.5 43.4  MCV 86.8 87.2 87.6 87.1  PLT 171 186 288 309   Cardiac Enzymes: No results for input(s): CKTOTAL, CKMB, CKMBINDEX, TROPONINI in the last 168 hours. BNP (last 3 results) No results for input(s): PROBNP in the last 8760 hours. CBG: No results for input(s): GLUCAP in the last 168 hours. D-Dimer: Recent Labs    07/06/19 0337 07/07/19 0212  DDIMER 0.50 0.52*   Hgb A1c: No results for input(s): HGBA1C in the last 72 hours. Lipid Profile: No results for input(s): CHOL, HDL, LDLCALC, TRIG, CHOLHDL, LDLDIRECT in the last 72 hours. Thyroid function studies: No results for input(s): TSH, T4TOTAL, T3FREE, THYROIDAB in the last 72 hours.  Invalid input(s): FREET3 Anemia work up: Recent Labs    07/06/19 0337 07/07/19 0212  FERRITIN 47 34   Sepsis Labs: Recent Labs  Lab 07/02/19 2327 07/03/19 0242 07/03/19 0518 07/04/19 0610 07/04/19 0814 07/06/19 0337 07/07/19 0212  PROCALCITON 0.23  --   --   --  0.21 <0.10  --   WBC 6.3  --   --  6.6  --  6.9 8.5  LATICACIDVEN  --  0.8 0.8  --   --   --   --    Microbiology Recent Results (from the past 240 hour(s))  Novel Coronavirus, NAA ([facility])     Status: None    Collection Time: 06/30/19 12:00 AM   Specimen: Nasopharyngeal(NP) swabs in vial transport medium   NASOPHARYNGE  TESTING  Result Value Ref Range Status   SARS-CoV-2, NAA Not Detected Not Detected Final    Comment: This nucleic acid amplification test was developed and its performance characteristics determined by 07/06/19. Nucleic acid amplification tests include  PCR and TMA. This test has not been FDA cleared or approved. This test has been authorized by FDA under an Emergency Use Authorization (EUA). This test is only authorized for the duration of time the declaration that circumstances exist justifying the authorization of the emergency use of in vitro diagnostic tests for detection of SARS-CoV-2 virus and/or diagnosis of COVID-19 infection under section 564(b)(1) of the Act, 21 U.S.C. 326ZTI-4(P) (1), unless the authorization is terminated or revoked sooner. When diagnostic testing is negative, the possibility of a false negative result should be considered in the context of a patient's recent exposures and the presence of clinical signs and symptoms consistent with COVID-19. An individual without symptoms of COVID-19 and who is not shedding SARS-CoV-2 virus would  expect to have a negative (not detected) result in this assay.   Blood Culture (routine x 2)     Status: None (Preliminary result)   Collection Time: 07/03/19  2:42 AM   Specimen: BLOOD  Result Value Ref Range Status   Specimen Description BLOOD RIGHT ARM  Final   Special Requests   Final    BOTTLES DRAWN AEROBIC AND ANAEROBIC Blood Culture results may not be optimal due to an inadequate volume of blood received in culture bottles   Culture   Final    NO GROWTH 4 DAYS Performed at [facility], [place] [address] [place], [place] [address]    Report Status PENDING  Incomplete  SARS CORONAVIRUS 2 (TAT 6-24 HRS) Nasopharyngeal Nasopharyngeal Swab     Status: Abnormal   Collection Time: 07/03/19  3:45  AM   Specimen: Nasopharyngeal Swab  Result Value Ref Range Status   SARS Coronavirus 2 POSITIVE (A) NEGATIVE Final    Comment: RESULT CALLED TO, READ BACK BY AND VERIFIED WITH: H.HARDY,RN [id number] I.MANNING (NOTE) SARS-CoV-2 target nucleic acids are DETECTED. The SARS-CoV-2 RNA is generally detectable in upper and lower respiratory specimens during the acute phase of infection. Positive results are indicative of the presence of SARS-CoV-2 RNA. Clinical correlation with patient history and other diagnostic information is  necessary to determine patient infection status. Positive results do not rule out bacterial infection or co-infection with other viruses.  The expected result is Negative. Fact Sheet for Patients: SugarRoll.be Fact Sheet for Healthcare Providers: https://www.woods-mathews.com/ This test is not yet approved or cleared by the [place] FDA and  has been authorized for detection and/or diagnosis of SARS-CoV-2 by FDA under an Emergency Use Authorization (EUA). This EUA will remain  in effect (meaning this test can be used) for the  duration of the COVID-19 declaration under Section 564(b)(1) of the Act, 21 U.S.C. section 360bbb-3(b)(1), unless the authorization is terminated or revoked sooner. Performed at [facility], [place] [address], [place], [place] [address]   Blood Culture (routine x 2)     Status: None (Preliminary result)   Collection Time: 07/03/19  5:03 AM   Specimen: BLOOD  Result Value Ref Range Status   Specimen Description BLOOD RIGHT HAND  Final   Special Requests   Final    BOTTLES DRAWN AEROBIC AND ANAEROBIC Blood Culture results may not be optimal due to an inadequate volume of blood received in culture bottles   Culture   Final    NO GROWTH 4 DAYS Performed at [facility], [place] [address], [place], [place] [address]    Report Status PENDING  Incomplete     Medications:     albuterol  2 puff Inhalation  Q6H   vitamin C  500 mg Oral Daily   dexamethasone (DECADRON) injection  6 mg Intravenous Q24H   enoxaparin (LOVENOX) injection  60 mg Subcutaneous Q24H   famotidine  20 mg Oral BID   sodium chloride flush  3 mL Intravenous Once   sodium chloride flush  3 mL Intravenous Q12H   zinc sulfate  220 mg Oral Daily   Continuous Infusions:  remdesivir 100 mg in NS 100 mL 100 mg (07/06/19 0905)      LOS: 4 days   Marinda Elk  [facility]  07/07/2019, 7:50 AM

## 2019-07-08 DIAGNOSIS — J4520 Mild intermittent asthma, uncomplicated: Secondary | ICD-10-CM

## 2019-07-08 LAB — COMPREHENSIVE METABOLIC PANEL
ALT: 45 U/L — ABNORMAL HIGH (ref 0–44)
AST: 24 U/L (ref 15–41)
Albumin: 2.9 g/dL — ABNORMAL LOW (ref 3.5–5.0)
Alkaline Phosphatase: 42 U/L (ref 38–126)
Anion gap: 7 (ref 5–15)
BUN: 14 mg/dL (ref 6–20)
CO2: 26 mmol/L (ref 22–32)
Calcium: 8.5 mg/dL — ABNORMAL LOW (ref 8.9–10.3)
Chloride: 106 mmol/L (ref 98–111)
Creatinine, Ser: 0.78 mg/dL (ref 0.44–1.00)
GFR calc Af Amer: >60 mL/min (ref >60)
GFR calc non Af Amer: >60 mL/min (ref >60)
Glucose, Bld: 111 mg/dL — ABNORMAL HIGH (ref 70–99)
Potassium: 3.9 mmol/L (ref 3.5–5.1)
Sodium: 139 mmol/L (ref 135–145)
Total Bilirubin: 0.5 mg/dL (ref 0.3–1.2)
Total Protein: 6.9 g/dL (ref 6.5–8.1)

## 2019-07-08 LAB — CBC
HCT: 44.4 % (ref 36.0–46.0)
Hemoglobin: 13.8 g/dL (ref 12.0–15.0)
MCH: 26.8 pg (ref 26.0–34.0)
MCHC: 31.1 g/dL (ref 30.0–36.0)
MCV: 86.4 fL (ref 80.0–100.0)
Platelets: 334 10*3/uL (ref 150–400)
RBC: 5.14 MIL/uL — ABNORMAL HIGH (ref 3.87–5.11)
RDW: 13.2 % (ref 11.5–15.5)
WBC: 10.9 10*3/uL — ABNORMAL HIGH (ref 4.0–10.5)
nRBC: 0 % (ref 0.0–0.2)

## 2019-07-08 LAB — CULTURE, BLOOD (ROUTINE X 2)
Culture: NO GROWTH
Culture: NO GROWTH

## 2019-07-08 LAB — PHOSPHORUS: Phosphorus: 3.1 mg/dL (ref 2.5–4.6)

## 2019-07-08 LAB — D-DIMER, QUANTITATIVE: D-Dimer, Quant: 0.39 ug/mL-FEU (ref 0.00–0.50)

## 2019-07-08 LAB — MAGNESIUM: Magnesium: 2 mg/dL (ref 1.7–2.4)

## 2019-07-08 LAB — C-REACTIVE PROTEIN: CRP: 0.6 mg/dL (ref <1.0)

## 2019-07-08 LAB — FERRITIN: Ferritin: 28 ng/mL (ref 11–307)

## 2019-07-08 MED ORDER — DEXAMETHASONE 6 MG PO TABS: 6.0000 mg | ORAL_TABLET | Freq: Every day | ORAL | 0 refills | Status: AC

## 2019-07-08 MED ORDER — DEXAMETHASONE 6 MG PO TABS
6.0000 mg | ORAL_TABLET | Freq: Every day | ORAL | Status: DC
  Administered 2019-07-08: 6 mg via ORAL
  Filled 2019-07-08: qty 1

## 2019-07-08 NOTE — Progress Notes (Signed)
[facility] called for transportation  Tessie Ordaz, LCSW Transitions of Care Department System Wide Float  [phone]  

## 2019-07-08 NOTE — Discharge Summary (Signed)
Physician Discharge Summary  DEEMA JUNCAJ RUE:[id number] DOB: 1981/01/31 DOA: 07/02/2019  PCP: System, Provider Not In  Admit date: 07/02/2019 Discharge date: 07/08/2019  Admitted From: Home Disposition:  Home  Recommendations for Outpatient Follow-up:  1. Follow up with PCP in 1-2 weeks 2. Please obtain BMP/CBC in one week 3.   Home Health:No Equipment/Devices:Oxygen 2 L  Discharge Condition:stable CODE STATUS:Full Diet recommendation: Heart Healthy  Brief/Interim Summary: 38 y.o. female past medical history of asthma and hyperlipidemia comes in complaining of progressive cough and shortness of breath that started 3 days prior to admission.  She relates she started having fevers about a week ago followed by nonbloody diarrhea she has had multiple family members tested positive for COVID-19, she underwent screening 3 days prior to admission and she was negative.  She continues to have intermittent productive cough, came into the ED she was found to be with a heart rate of 137 with breathing 28 times per minute and satting 88% on room air placed on 4 L of oxygen and saturations improved.  Transferred to [facility]  Discharge Diagnoses:  Principal Problem:   Pneumonia due to COVID-19 virus Active Problems:   Acute respiratory failure with hypoxia (HCC)   Mild intermittent asthma   Obesity, Class III, BMI 40-49.9 (morbid obesity) (HCC) Acute respiratory failure with hypoxia secondarily to pneumonia due to COVID-19: She was placed on oxygen supplementation on admission try to keep saturations greater than 94%. She was started empirically on IV remdesivir, steroids vitamin C and zinc. Her inflammatory markers were elevated but they drastically started to improve once treatment was started. She was wean to room air, she ambulates and sats remained stable She will continue Decadron as an outpatient for total of 10 days.  Mild intermittent asthma: Continue inhalers  stable.  Morbid obesity: She has been counseled.   Discharge Instructions  Discharge Instructions    Diet - low sodium heart healthy   Complete by: As directed    Increase activity slowly   Complete by: As directed    MyChart COVID-19 home monitoring program   Complete by: Jul 08, 2019    Is the patient willing to use the MyChart Mobile App for home monitoring?: Yes   Temperature monitoring   Complete by: Jul 08, 2019    After how many days would you like to receive a notification of this patient's flowsheet entries?: 1     Allergies as of 07/08/2019   No Known Allergies     Medication List    TAKE these medications   Alka-Seltzer 325 MG Tbef tablet Generic drug: aspirin-sod bicarb-citric acid Take 325 mg by mouth every 6 (six) hours as needed (for cold symptoms).   bismuth subsalicylate 262 MG/15ML suspension Commonly known as: PEPTO BISMOL Take 30 mLs by mouth every 6 (six) hours as needed for indigestion or diarrhea or loose stools.   dexamethasone 6 MG tablet Commonly known as: DECADRON Take 1 tablet (6 mg total) by mouth daily for 4 days.   levonorgestrel 20 MCG/24HR IUD Commonly known as: MIRENA 1 each by Intrauterine route once.   NYQUIL PO Take 10 mLs by mouth at bedtime as needed (for cold symptoms).   potassium chloride SA 20 MEQ tablet Commonly known as: KLOR-CON Take 1 tablet (20 mEq total) by mouth 2 (two) times daily for 2 days.   traMADol 50 MG tablet Commonly known as: ULTRAM Take 1 tablet (50 mg total) by mouth every 6 (six) hours as needed.  Durable Medical Equipment  (From admission, onward)         Start     Ordered   07/08/19 0840  DME Oxygen  Once    Question Answer Comment  Length of Need 6 Months   Mode or (Route) Nasal cannula   Liters per Minute 2   Frequency Continuous (stationary and portable oxygen unit needed)   Oxygen conserving device Yes   Oxygen delivery system Gas      07/08/19 0840           No Known Allergies  Consultations:  None   Procedures/Studies: CT Angio Chest PE W/Cm &/Or Wo Cm  Result Date: 07/03/2019 CLINICAL DATA:  Hypoxia and tachycardia.  Pain. EXAM: CT ANGIOGRAPHY CHEST WITH CONTRAST TECHNIQUE: Multidetector CT imaging of the chest was performed using the standard protocol during bolus administration of intravenous contrast. Multiplanar CT image reconstructions and MIPs were obtained to evaluate the vascular anatomy. CONTRAST:  80mL OMNIPAQUE IOHEXOL 350 MG/ML SOLN COMPARISON:  Chest radiograph July 03, 2019 FINDINGS: Cardiovascular: There is no demonstrable pulmonary embolus. There is no thoracic aortic aneurysm or dissection. Visualized great vessels appear unremarkable. There is no pericardial effusion or pericardial thickening. Mediastinum/Nodes: Thyroid appears unremarkable. There are subcentimeter mediastinal lymph nodes. There is a right pretracheal lymph node measuring 1.2 x 1.1 cm. There is a subcarinal lymph node slightly to the right of midline measuring 1.5 x 1.5 cm. No esophageal lesions are evident. Lungs/Pleura: There is multifocal airspace opacity throughout the lungs fairly diffusely, most severely involving both lower lobes in the right middle lobe but also with fairly extensive involvement in portions of each upper lobe. Scattered areas of mild consolidation are noted. No pleural effusions are evident. Upper Abdomen: Visualized upper abdominal structures appear normal. Musculoskeletal: No blastic or lytic bone lesions. No chest wall lesions evident. Review of the MIP images confirms the above findings. IMPRESSION: 1. No demonstrable pulmonary embolus. No thoracic aortic aneurysm or dissection. 2. Extensive multifocal pneumonia bilaterally with the greatest concentration of pneumonia in the lower lobes and right middle lobe and also fairly extensive pneumonitis in each upper lobe. 3. Prominent subcarinal and pretracheal lymph nodes which may well  have reactive etiology given the extent of parenchymal lung abnormality. Electronically Signed   By: Bretta Bang III M.D.   On: 07/03/2019 07:09   DG CHEST PORT 1 VIEW  Result Date: 07/04/2019 CLINICAL DATA:  Pneumonia EXAM: PORTABLE CHEST 1 VIEW COMPARISON:  07/03/2019 FINDINGS: Worsening bilateral airspace disease compatible with worsening pneumonia. Low lung volumes, decreasing since prior study. Heart is normal size. No effusions. No acute bony abnormality. IMPRESSION: Worsening aeration and bilateral airspace disease. Electronically Signed   By: Charlett Nose M.D.   On: 07/04/2019 22:00   DG Chest Port 1 View  Result Date: 07/03/2019 CLINICAL DATA:  Shortness of breath EXAM: PORTABLE CHEST 1 VIEW COMPARISON:  February 02, 2019 FINDINGS: There are multifocal ground-glass airspace opacities bilaterally. There is no pneumothorax. No large pleural effusion. The heart size is borderline enlarged. The lung volumes are low. IMPRESSION: Multifocal airspace opacities bilaterally concerning for multifocal pneumonia (viral or bacterial). Electronically Signed   By: Katherine Mantle M.D.   On: 07/03/2019 02:32    Subjective: No complaints feels great.  Discharge Exam: Vitals:   07/08/19 0900 07/08/19 1042  BP:    Pulse:    Resp: 19   Temp:    SpO2: 98% 95%   Vitals:   07/08/19 0400 07/08/19 0754 07/08/19 0900  07/08/19 1042  BP: (!) 106/57 111/70    Pulse: (!) 58 (!) 54    Resp: 16 18 19    Temp: 98.8 F (37.1 C) 98 F (36.7 C)    TempSrc: Oral Oral    SpO2: 99% 100% 98% 95%  Weight:      Height:        General: Pt is alert, awake, not in acute distress Cardiovascular: RRR, S1/S2 +, no rubs, no gallops Respiratory: CTA bilaterally, no wheezing, no rhonchi Abdominal: Soft, NT, ND, bowel sounds + Extremities: no edema, no cyanosis    The results of significant diagnostics from this hospitalization (including imaging, microbiology, ancillary and laboratory) are listed below for  reference.     Microbiology: Recent Results (from the past 240 hour(s))  Novel Coronavirus, NAA ([facility])     Status: None   Collection Time: 06/30/19 12:00 AM   Specimen: Nasopharyngeal(NP) swabs in vial transport medium   NASOPHARYNGE  TESTING  Result Value Ref Range Status   SARS-CoV-2, NAA Not Detected Not Detected Final    Comment: This nucleic acid amplification test was developed and its performance characteristics determined by [facility]. Nucleic acid amplification tests include PCR and TMA. This test has not been FDA cleared or approved. This test has been authorized by FDA under an Emergency Use Authorization (EUA). This test is only authorized for the duration of time the declaration that circumstances exist justifying the authorization of the emergency use of in vitro diagnostic tests for detection of SARS-CoV-2 virus and/or diagnosis of COVID-19 infection under section 564(b)(1) of the Act, 21 U.S.C. 161WRU-0(A360bbb-3(b) (1), unless the authorization is terminated or revoked sooner. When diagnostic testing is negative, the possibility of a false negative result should be considered in the context of a patient's recent exposures and the presence of clinical signs and symptoms consistent with COVID-19. An individual without symptoms of COVID-19 and who is not shedding SARS-CoV-2 virus would  expect to have a negative (not detected) result in this assay.   Blood Culture (routine x 2)     Status: None   Collection Time: 07/03/19  2:42 AM   Specimen: BLOOD  Result Value Ref Range Status   Specimen Description BLOOD RIGHT ARM  Final   Special Requests   Final    BOTTLES DRAWN AEROBIC AND ANAEROBIC Blood Culture results may not be optimal due to an inadequate volume of blood received in culture bottles   Culture   Final    NO GROWTH 5 DAYS Performed at [facility], [address] [place], [place] [address]    Report Status 07/08/2019 FINAL  Final  SARS  CORONAVIRUS 2 (TAT 6-24 HRS) Nasopharyngeal Nasopharyngeal Swab     Status: Abnormal   Collection Time: 07/03/19  3:45 AM   Specimen: Nasopharyngeal Swab  Result Value Ref Range Status   SARS Coronavirus 2 POSITIVE (A) NEGATIVE Final    Comment: RESULT CALLED TO, READ BACK BY AND VERIFIED WITH: H.HARDY,RN 1608 [id number] I.MANNING (NOTE) SARS-CoV-2 target nucleic acids are DETECTED. The SARS-CoV-2 RNA is generally detectable in upper and lower respiratory specimens during the acute phase of infection. Positive results are indicative of the presence of SARS-CoV-2 RNA. Clinical correlation with patient history and other diagnostic information is  necessary to determine patient infection status. Positive results do not rule out bacterial infection or co-infection with other viruses.  The expected result is Negative. Fact Sheet for Patients: HairSlick.nohttps://www.fda.gov/media/138098/download Fact Sheet for Healthcare Providers: quierodirigir.comhttps://www.fda.gov/media/138095/download This test is not  yet approved or cleared by the [place] and  has been authorized for detection and/or diagnosis of SARS-CoV-2 by FDA under an Emergency Use Authorization (EUA). This EUA will remain  in effect (meaning this test can be used) for the  duration of the COVID-19 declaration under Section 564(b)(1) of the Act, 21 U.S.C. section 360bbb-3(b)(1), unless the authorization is terminated or revoked sooner. Performed at [facility], [address] [place], [place] [address]   Blood Culture (routine x 2)     Status: None   Collection Time: 07/03/19  5:03 AM   Specimen: BLOOD  Result Value Ref Range Status   Specimen Description BLOOD RIGHT HAND  Final   Special Requests   Final    BOTTLES DRAWN AEROBIC AND ANAEROBIC Blood Culture results may not be optimal due to an inadequate volume of blood received in culture bottles   Culture   Final    NO GROWTH 5 DAYS Performed at [facility], [address] [place], [place] [address]    Report Status 07/08/2019 FINAL  Final     Labs: BNP (last 3 results) No results for input(s): BNP in the last 8760 hours. Basic Metabolic Panel: Recent Labs  Lab 07/02/19 2327 07/04/19 0610 07/06/19 0337 07/07/19 0212 07/08/19 0232  NA 135 139 141 141 139  K 4.0 3.9 4.3 4.3 3.9  CL 102 105 105 106 106  CO2 GLUCOSE 128* 107* 97 116* 111*  BUN 5* CREATININE 0.85 0.71 0.79 0.79 0.78  CALCIUM 8.3* 7.9* 8.7* 8.7* 8.5*  MG  --  1.8 2.0 2.0 2.0  PHOS  --  2.4* 2.7 3.2 3.1   Liver Function Tests: Recent Labs  Lab 07/03/19 0127 07/04/19 0610 07/06/19 0337 07/07/19 0212 07/08/19 0232  AST ALT 34 45*  ALKPHOS 45 42 46 44 42  BILITOT 0.7 0.5 0.4 0.3 0.5  PROT 7.4 7.0 7.0 7.2 6.9  ALBUMIN 3.1* 2.8* 2.9* 3.0* 2.9*   Recent Labs  Lab 07/03/19 0249  LIPASE 26   No results for input(s): AMMONIA in the last 168 hours. CBC: Recent Labs  Lab 07/02/19 2327 07/04/19 0610 07/06/19 0337 07/07/19 0212 07/08/19 0232  WBC 6.3 6.6 6.9 8.5 10.9*  NEUTROABS  --  5.2  --   --   --   HGB 13.6 12.6 12.9 13.7 13.8  HCT 42.8 39.4 41.5 43.4 44.4  MCV 86.8 87.2 87.6 87.1 86.4  PLT 171 186 288 309 334   Cardiac Enzymes: No results for input(s): CKTOTAL, CKMB, CKMBINDEX, TROPONINI in the last 168 hours. BNP: Invalid input(s): POCBNP CBG: No results for input(s): GLUCAP in the last 168 hours. D-Dimer Recent Labs    07/07/19 0212 07/08/19 0232  DDIMER 0.52* 0.39   Hgb A1c No results for input(s): HGBA1C in the last 72 hours. Lipid Profile No results for input(s): CHOL, HDL, LDLCALC, TRIG, CHOLHDL, LDLDIRECT in the last 72 hours. Thyroid function studies No results for input(s): TSH, T4TOTAL, T3FREE, THYROIDAB in the last 72 hours.  Invalid input(s): FREET3 Anemia work up Recent Labs    07/07/19 0212 07/08/19 0232  FERRITIN 34 28   Urinalysis    Component Value Date/Time   COLORURINE  YELLOW 04/09/2018 0700   APPEARANCEUR HAZY (A) 04/09/2018 0700   LABSPEC >=1.030 04/15/2019 1303   PHURINE 5.5 04/15/2019 1303   GLUCOSEU NEGATIVE  04/15/2019 1303   HGBUR SMALL (A) 04/15/2019 1303   BILIRUBINUR NEGATIVE 04/15/2019 1303   KETONESUR NEGATIVE 04/15/2019 1303   PROTEINUR NEGATIVE 04/15/2019 1303   UROBILINOGEN 0.2 04/15/2019 1303   NITRITE NEGATIVE 04/15/2019 1303   LEUKOCYTESUR NEGATIVE 04/15/2019 1303   Sepsis Labs Invalid input(s): PROCALCITONIN,  WBC,  LACTICIDVEN Microbiology Recent Results (from the past 240 hour(s))  Novel Coronavirus, NAA ([facility])     Status: None   Collection Time: 06/30/19 12:00 AM   Specimen: Nasopharyngeal(NP) swabs in vial transport medium   NASOPHARYNGE  TESTING  Result Value Ref Range Status   SARS-CoV-2, NAA Not Detected Not Detected Final    Comment: This nucleic acid amplification test was developed and its performance characteristics determined by [facility]. Nucleic acid amplification tests include PCR and TMA. This test has not been FDA cleared or approved. This test has been authorized by FDA under an Emergency Use Authorization (EUA). This test is only authorized for the duration of time the declaration that circumstances exist justifying the authorization of the emergency use of in vitro diagnostic tests for detection of SARS-CoV-2 virus and/or diagnosis of COVID-19 infection under section 564(b)(1) of the Act, 21 U.S.C. 269SWN-4(O) (1), unless the authorization is terminated or revoked sooner. When diagnostic testing is negative, the possibility of a false negative result should be considered in the context of a patient's recent exposures and the presence of clinical signs and symptoms consistent with COVID-19. An individual without symptoms of COVID-19 and who is not shedding SARS-CoV-2 virus would  expect to have a negative (not detected) result in this assay.   Blood Culture (routine x 2)     Status: None    Collection Time: 07/03/19  2:42 AM   Specimen: BLOOD  Result Value Ref Range Status   Specimen Description BLOOD RIGHT ARM  Final   Special Requests   Final    BOTTLES DRAWN AEROBIC AND ANAEROBIC Blood Culture results may not be optimal due to an inadequate volume of blood received in culture bottles   Culture   Final    NO GROWTH 5 DAYS Performed at [facility], [address], [place], [place] [address]    Report Status 07/08/2019 FINAL  Final  SARS CORONAVIRUS 2 (TAT 6-24 HRS) Nasopharyngeal Nasopharyngeal Swab     Status: Abnormal   Collection Time: 07/03/19  3:45 AM   Specimen: Nasopharyngeal Swab  Result Value Ref Range Status   SARS Coronavirus 2 POSITIVE (A) NEGATIVE Final    Comment: RESULT CALLED TO, READ BACK BY AND VERIFIED WITH: H.HARDY,RN 1608 [id number] I.MANNING (NOTE) SARS-CoV-2 target nucleic acids are DETECTED. The SARS-CoV-2 RNA is generally detectable in upper and lower respiratory specimens during the acute phase of infection. Positive results are indicative of the presence of SARS-CoV-2 RNA. Clinical correlation with patient history and other diagnostic information is  necessary to determine patient infection status. Positive results do not rule out bacterial infection or co-infection with other viruses.  The expected result is Negative. Fact Sheet for Patients: HairSlick.no Fact Sheet for Healthcare Providers: quierodirigir.com This test is not yet approved or cleared by the [place] FDA and  has been authorized for detection and/or diagnosis of SARS-CoV-2 by FDA under an Emergency Use Authorization (EUA). This EUA will remain  in effect (meaning this test can be used) for the  duration of the COVID-19 declaration under Section 564(b)(1) of the Act, 21 U.S.C. section 360bbb-3(b)(1), unless the authorization is terminated or revoked sooner. Performed at  [facility], [place] [address],  [place], [place] [address]   Blood Culture (routine x 2)     Status: None   Collection Time: 07/03/19  5:03 AM   Specimen: BLOOD  Result Value Ref Range Status   Specimen Description BLOOD RIGHT HAND  Final   Special Requests   Final    BOTTLES DRAWN AEROBIC AND ANAEROBIC Blood Culture results may not be optimal due to an inadequate volume of blood received in culture bottles   Culture   Final    NO GROWTH 5 DAYS Performed at [facility], [place] [address], [place], [place] [address]    Report Status 07/08/2019 FINAL  Final     Time coordinating discharge: Over 40 minutes  SIGNED:   Charlynne Cousins, MD  [facility] 07/08/2019, 11:16 AM Pager   If 7PM-7AM, please contact night-coverage [email or web address] Password [email or web address]

## 2019-07-08 NOTE — Progress Notes (Signed)
PTAR bedside preparing for pt transport to home at this time. Pt and belongings transported w/pt

## 2019-07-09 ENCOUNTER — Encounter (INDEPENDENT_AMBULATORY_CARE_PROVIDER_SITE_OTHER): Payer: Self-pay

## 2019-07-10 ENCOUNTER — Encounter (INDEPENDENT_AMBULATORY_CARE_PROVIDER_SITE_OTHER): Payer: Self-pay

## 2019-07-11 ENCOUNTER — Encounter (INDEPENDENT_AMBULATORY_CARE_PROVIDER_SITE_OTHER): Payer: Self-pay

## 2019-07-12 ENCOUNTER — Encounter (INDEPENDENT_AMBULATORY_CARE_PROVIDER_SITE_OTHER): Payer: Self-pay

## 2019-07-14 ENCOUNTER — Encounter (INDEPENDENT_AMBULATORY_CARE_PROVIDER_SITE_OTHER): Payer: Self-pay

## 2019-07-16 ENCOUNTER — Encounter (INDEPENDENT_AMBULATORY_CARE_PROVIDER_SITE_OTHER): Payer: Self-pay

## 2019-07-17 ENCOUNTER — Encounter (INDEPENDENT_AMBULATORY_CARE_PROVIDER_SITE_OTHER): Payer: Self-pay

## 2019-07-18 ENCOUNTER — Encounter (INDEPENDENT_AMBULATORY_CARE_PROVIDER_SITE_OTHER): Payer: Self-pay

## 2019-07-25 ENCOUNTER — Emergency Department (HOSPITAL_COMMUNITY): Payer: BLUE CROSS/BLUE SHIELD

## 2019-07-25 ENCOUNTER — Emergency Department (HOSPITAL_COMMUNITY)
Admission: EM | Admit: 2019-07-25 | Discharge: 2019-07-26 | Disposition: A | Discharge status: Home / Self Care | Payer: BLUE CROSS/BLUE SHIELD | Attending: Emergency Medicine | Admitting: Emergency Medicine

## 2019-07-25 ENCOUNTER — Encounter (HOSPITAL_COMMUNITY): Payer: Self-pay | Admitting: *Deleted

## 2019-07-25 DIAGNOSIS — R079 Chest pain, unspecified: Secondary | ICD-10-CM | POA: Diagnosis not present

## 2019-07-25 DIAGNOSIS — R202 Paresthesia of skin: Secondary | ICD-10-CM | POA: Diagnosis not present

## 2019-07-25 DIAGNOSIS — Z79899 Other long term (current) drug therapy: Secondary | ICD-10-CM | POA: Diagnosis not present

## 2019-07-25 DIAGNOSIS — R0602 Shortness of breath: Secondary | ICD-10-CM | POA: Diagnosis not present

## 2019-07-25 DIAGNOSIS — J45909 Unspecified asthma, uncomplicated: Secondary | ICD-10-CM | POA: Diagnosis not present

## 2019-07-25 DIAGNOSIS — U071 COVID-19: Secondary | ICD-10-CM | POA: Insufficient documentation

## 2019-07-25 DIAGNOSIS — F419 Anxiety disorder, unspecified: Secondary | ICD-10-CM | POA: Diagnosis not present

## 2019-07-25 LAB — TROPONIN I (HIGH SENSITIVITY)
Troponin I (High Sensitivity): <2 ng/L (ref <18)
Troponin I (High Sensitivity): 2 ng/L (ref <18)

## 2019-07-25 LAB — CBC
HCT: 42.5 % (ref 36.0–46.0)
Hemoglobin: 13.4 g/dL (ref 12.0–15.0)
MCH: 27.6 pg (ref 26.0–34.0)
MCHC: 31.5 g/dL (ref 30.0–36.0)
MCV: 87.6 fL (ref 80.0–100.0)
Platelets: 193 10*3/uL (ref 150–400)
RBC: 4.85 MIL/uL (ref 3.87–5.11)
RDW: 13.9 % (ref 11.5–15.5)
WBC: 7.9 10*3/uL (ref 4.0–10.5)
nRBC: 0 % (ref 0.0–0.2)

## 2019-07-25 LAB — BASIC METABOLIC PANEL
Anion gap: 10 (ref 5–15)
BUN: 6 mg/dL (ref 6–20)
CO2: 23 mmol/L (ref 22–32)
Calcium: 9.1 mg/dL (ref 8.9–10.3)
Chloride: 103 mmol/L (ref 98–111)
Creatinine, Ser: 0.83 mg/dL (ref 0.44–1.00)
GFR calc Af Amer: >60 mL/min (ref >60)
GFR calc non Af Amer: >60 mL/min (ref >60)
Glucose, Bld: 102 mg/dL — ABNORMAL HIGH (ref 70–99)
Potassium: 3.4 mmol/L — ABNORMAL LOW (ref 3.5–5.1)
Sodium: 136 mmol/L (ref 135–145)

## 2019-07-25 LAB — D-DIMER, QUANTITATIVE: D-Dimer, Quant: 0.88 ug/mL-FEU — ABNORMAL HIGH (ref 0.00–0.50)

## 2019-07-25 LAB — I-STAT BETA HCG BLOOD, ED (MC, WL, AP ONLY): I-stat hCG, quantitative: <5 m[IU]/mL (ref <5)

## 2019-07-25 MED ORDER — LACTATED RINGERS IV BOLUS
1000.0000 mL | Freq: Once | INTRAVENOUS | Status: AC
  Administered 2019-07-25: 16:00:00 1000 mL via INTRAVENOUS

## 2019-07-25 MED ORDER — LORAZEPAM 1 MG PO TABS
1.0000 mg | ORAL_TABLET | Freq: Once | ORAL | Status: AC
  Administered 2019-07-25: 16:00:00 1 mg via ORAL
  Filled 2019-07-25: qty 1

## 2019-07-25 MED ORDER — IOHEXOL 350 MG/ML SOLN
100.0000 mL | Freq: Once | INTRAVENOUS | Status: AC | PRN
  Administered 2019-07-25: 18:00:00 100 mL via INTRAVENOUS

## 2019-07-25 MED ORDER — SODIUM CHLORIDE 0.9% FLUSH: 3.0000 mL | Freq: Once | INTRAVENOUS | Status: DC

## 2019-07-25 NOTE — ED Triage Notes (Signed)
To ED for eval of sob. Pt was in [facility] for a week starting 12/16 and discharged 12/25. States she is feeling a little discomfort in her chest. Unable to describe the discomfort. Pt speaks in full clear sentences. Appears in nad. Anxious and crying.

## 2019-07-25 NOTE — ED Provider Notes (Signed)
[facility] EMERGENCY DEPARTMENT Provider Note   CSN: [id number] Arrival date & time: 07/25/19  1118     History Chief Complaint  Patient presents with  . Shortness of Breath    Amy Mendoza is a 39 y.o. female.  HPI Amy Mendoza is a 39 y.o. female with a medical history of asthma, hld who presents to the ED for intermittent sharp chest pain and shortness of breath. Noticed symptoms starting yesterday while at rest  HPI: A 39 year old patient with a history of hypercholesterolemia and obesity presents for evaluation of chest pain. Initial onset of pain was less than one hour ago. The patient's chest pain is well-localized and is not worse with exertion. The patient's chest pain is middle- or left-sided, is not described as heaviness/pressure/tightness, is not sharp and does not radiate to the arms/jaw/neck. The patient does not complain of nausea and denies diaphoresis. The patient has no history of stroke, has no history of peripheral artery disease, has not smoked in the past 90 days, denies any history of treated diabetes, has no relevant family history of coronary artery disease (first degree relative at less than age 7) and is not hypertensive.     Past Medical History:  Diagnosis Date  . Asthma   . Gallstones   . Hyperlipemia     Patient Active Problem List   Diagnosis Date Noted  . Obesity, Class III, BMI 40-49.9 (morbid obesity) (HCC) 07/07/2019  . Pneumonia due to COVID-19 virus 07/03/2019  . Acute respiratory failure with hypoxia (HCC) 07/03/2019  . Mild intermittent asthma 07/03/2019  . Routine general medical examination at a health care facility 01/18/2017    Past Surgical History:  Procedure Laterality Date  . CHOLECYSTECTOMY  feb 2006  . KNEE SURGERY     left     OB History   No obstetric history on file.     Family History  Problem Relation Age of Onset  . Diabetes Father        MGM, maternal uncle  . Prostate cancer  Maternal Grandfather   . Breast cancer Maternal Aunt   . Colon cancer Neg Hx     Social History   Tobacco Use  . Smoking status: Never Smoker  . Smokeless tobacco: Never Used  Substance Use Topics  . Alcohol use: Yes    Comment: occasionally  . Drug use: Yes    Types: Marijuana    Home Medications Prior to Admission medications   Medication Sig Start Date End Date Taking? Authorizing Provider  Ascorbic Acid (VITAMIN C PO) Take 2 tablets by mouth daily.   Yes [provider]  famotidine (PEPCID) 10 MG tablet Take 10 mg by mouth daily.   Yes [provider]  levonorgestrel (MIRENA) 20 MCG/24HR IUD 1 each by Intrauterine route once.   Yes [provider]  potassium chloride SA (K-DUR) 20 MEQ tablet Take 1 tablet (20 mEq total) by mouth 2 (two) times daily for 2 days. Patient not taking: Reported on 07/03/2019 11/09/18 11/11/18  Alvira Monday, MD  traMADol (ULTRAM) 50 MG tablet Take 1 tablet (50 mg total) by mouth every 6 (six) hours as needed. Patient not taking: Reported on 07/03/2019 04/15/19   Dahlia Byes A, NP  fluticasone (FLONASE) 50 MCG/ACT nasal spray Place 1 spray into both nostrils daily. Patient not taking: Reported on 11/09/2018 07/01/18 04/15/19  Janne Napoleon, NP    Allergies    Patient has no known allergies.  Review  of Systems   Review of Systems  Constitutional: Negative for chills and fever.  HENT: Negative for ear pain and sore throat.   Eyes: Negative for pain and visual disturbance.  Respiratory: Positive for shortness of breath. Negative for cough.   Cardiovascular: Positive for chest pain. Negative for palpitations.  Gastrointestinal: Negative for abdominal pain and vomiting.  Genitourinary: Negative for dysuria and hematuria.  Musculoskeletal: Negative for arthralgias and back pain.  Skin: Negative for color change and rash.  Neurological: Negative for seizures and syncope.  Psychiatric/Behavioral: Positive for sleep  disturbance. The patient is nervous/anxious.   All other systems reviewed and are negative.   Physical Exam Updated Vital Signs BP 124/75 (BP Location: Right Arm)   Pulse 95   Temp 98.7 F (37.1 C) (Oral)   Resp 18   SpO2 97%   Physical Exam Vitals and nursing note reviewed.  Constitutional:      General: She is in acute distress.     Appearance: Normal appearance. She is well-developed. She is not ill-appearing.  HENT:     Head: Normocephalic and atraumatic.     Right Ear: External ear normal.     Left Ear: External ear normal.     Nose: Nose normal. No rhinorrhea.     Mouth/Throat:     Mouth: Mucous membranes are moist.  Eyes:     General:        Right eye: No discharge.        Left eye: No discharge.     Conjunctiva/sclera: Conjunctivae normal.  Cardiovascular:     Rate and Rhythm: Normal rate and regular rhythm.     Pulses: Normal pulses.     Heart sounds: Normal heart sounds. No murmur.  Pulmonary:     Effort: Pulmonary effort is normal. No respiratory distress.     Breath sounds: Normal breath sounds. No wheezing or rales.  Abdominal:     General: Abdomen is flat. There is no distension.     Palpations: Abdomen is soft.     Tenderness: There is no abdominal tenderness.  Musculoskeletal:        General: No deformity or signs of injury. Normal range of motion.     Cervical back: Normal range of motion and neck supple.  Skin:    General: Skin is warm and dry.     Capillary Refill: Capillary refill takes less than 2 seconds.     Coloration: Skin is not jaundiced.  Neurological:     General: No focal deficit present.     Mental Status: She is alert. Mental status is at baseline.  Psychiatric:        Mood and Affect: Mood normal.        Behavior: Behavior normal.     ED Results / Procedures / Treatments   Labs (all labs ordered are listed, but only abnormal results are displayed) Labs Reviewed  BASIC METABOLIC PANEL - Abnormal; Notable for the following  components:      Result Value   Potassium 3.4 (*)    Glucose, Bld 102 (*)    All other components within normal limits  D-DIMER, QUANTITATIVE (NOT AT [facility]) - Abnormal; Notable for the following components:   D-Dimer, Quant 0.88 (*)    All other components within normal limits  CBC  I-STAT BETA HCG BLOOD, ED ([facility], [facility], [facility] ONLY)  TROPONIN I (HIGH SENSITIVITY)  TROPONIN I (HIGH SENSITIVITY)    EKG EKG Interpretation  Date/Time:  Friday July 25 2019 15:01:50 EST Ventricular Rate:  144 PR Interval:  162 QRS Duration: 72 QT Interval:  350 QTC Calculation: 541 R Axis:   81 Text Interpretation: Sinus tachycardia Nonspecific ST abnormality Abnormal ECG Rate faster st changes likely rate related Otherwise no significant change Confirmed by Melene Plan [phone]) on 07/25/2019 3:13:16 PM   Radiology CT Angio Chest PE W and/or Wo Contrast  Result Date: 07/25/2019 CLINICAL DATA:  Shortness of breath, history of COVID-19 positivity EXAM: CT ANGIOGRAPHY CHEST WITH CONTRAST TECHNIQUE: Multidetector CT imaging of the chest was performed using the standard protocol during bolus administration of intravenous contrast. Multiplanar CT image reconstructions and MIPs were obtained to evaluate the vascular anatomy. CONTRAST:  OMNIPAQUE IOHEXOL 350 MG/ML SOLN COMPARISON:  Chest x-ray from earlier in the same Dodd Schmid, CT from 07/03/2019 FINDINGS: Cardiovascular: Thoracic aorta shows no evidence of aneurysmal dilatation or dissection. No cardiac enlargement is noted. The pulmonary artery shows a normal branching pattern without definitive filling defect to suggest pulmonary embolism. Mediastinum/Nodes: Thoracic inlet is within normal limits. No sizable hilar or mediastinal adenopathy is noted. The esophagus as visualized is within normal limits. Lungs/Pleura: Lungs are well aerated bilaterally. Previously seen parenchymal opacities have nearly completely resolved. No new focal infiltrate or effusion is seen. No  sizable parenchymal nodules are noted. Upper Abdomen: Visualized upper abdomen shows no acute abnormality. Musculoskeletal: No chest wall abnormality. No acute or significant osseous findings. Review of the MIP images confirms the above findings. IMPRESSION: No evidence of pulmonary emboli. Previously seen parenchymal opacities consistent with COVID-19 pneumonia have nearly completely resolved in the interval from the prior exam. Electronically Signed   By: Alcide Clever M.D.   On: 07/25/2019 18:44   DG Chest Portable 1 View  Result Date: 07/25/2019 CLINICAL DATA:  Shortness of breath. EXAM: PORTABLE CHEST 1 VIEW COMPARISON:  July 04, 2019 FINDINGS: The heart size and mediastinal contours are within normal limits. Both lungs are clear. The visualized skeletal structures are unremarkable. IMPRESSION: No active disease. Electronically Signed   By: Aram Candela M.D.   On: 07/25/2019 16:58    Procedures Procedures (including critical care time)  Medications Ordered in ED Medications  sodium chloride flush (NS) 0.9 % injection 3 mL (has no administration in time range)  lactated ringers bolus 1,000 mL (1,000 mLs Intravenous New Bag/Given 07/25/19 1556)  LORazepam (ATIVAN) tablet 1 mg (1 mg Oral Given 07/25/19 1556)  iohexol (OMNIPAQUE) 350 MG/ML injection 100 mL (100 mLs Intravenous Contrast Given 07/25/19 1818)    ED Course  I have reviewed the triage vital signs and the nursing notes.  Pertinent labs & imaging results that were available during my care of the patient were reviewed by me and considered in my medical decision making (see chart for details).    MDM Rules/Calculators/A&P HEAR Score: 1                    DONNIELLE ADDISON is a 40 y.o. female with a medical history of asthma, hld who presents to the ED for intermittent sharp chest pain and shortness of breath. Noticed symptoms starting yesterday while at rest.  HPI and physical exam as above. She presents awake, very anxious, no  respiratory distress on room air, alert, hemodynamically stable, afebrile, non toxic.  She was treated with ativan and IV fluids and had improvement in her symptoms. Low suspicion for acs (hear score 1, x2 troponin unremarkable, non ischemic ecg), pe (cta pe study unremarkable). The symptoms she  described of tingling in her fingers, tightness in her chest and throat may be attributed to anxiety. We discussed follow up with her pcp. Discussed plan with patient and family including strict return precautions and follow up with PCP. Patient and family understand and are amenable with plan.   Final Clinical Impression(s) / ED Diagnoses Final diagnoses:  Chest pain, unspecified type    Rx / DC Orders ED Discharge Orders    None       Lukah Goswami, Ladona Ridgel, MD 07/25/19 2031    Melene Plan, DO 07/25/19 2051

## 2019-07-25 NOTE — ED Notes (Signed)
Brought back to triage for eval after pt states she feels her heart is fluttering and her body is feeling numb. ECG repeated. Portable CXR asked to come to triage for film. Skin w/d. Resp even. Pt is very anxious.

## 2019-07-31 ENCOUNTER — Encounter (HOSPITAL_COMMUNITY): Payer: Self-pay

## 2019-07-31 ENCOUNTER — Ambulatory Visit (HOSPITAL_COMMUNITY)
Admission: EM | Admit: 2019-07-31 | Discharge: 2019-07-31 | Disposition: A | Discharge status: Home / Self Care | Payer: BLUE CROSS/BLUE SHIELD | Attending: Emergency Medicine | Admitting: Emergency Medicine

## 2019-07-31 ENCOUNTER — Other Ambulatory Visit: Payer: Self-pay

## 2019-07-31 DIAGNOSIS — R079 Chest pain, unspecified: Secondary | ICD-10-CM

## 2019-07-31 DIAGNOSIS — R110 Nausea: Secondary | ICD-10-CM | POA: Diagnosis not present

## 2019-07-31 DIAGNOSIS — G44209 Tension-type headache, unspecified, not intractable: Secondary | ICD-10-CM | POA: Diagnosis not present

## 2019-07-31 DIAGNOSIS — K219 Gastro-esophageal reflux disease without esophagitis: Secondary | ICD-10-CM

## 2019-07-31 MED ORDER — ALUM & MAG HYDROXIDE-SIMETH 200-200-20 MG/5ML PO SUSP
ORAL | Status: AC
  Filled 2019-07-31: qty 30

## 2019-07-31 MED ORDER — METOCLOPRAMIDE HCL 5 MG/ML IJ SOLN
INTRAMUSCULAR | Status: AC
  Filled 2019-07-31: qty 2

## 2019-07-31 MED ORDER — TRIAMCINOLONE ACETONIDE 0.1 % EX CREA: 1.0000 "application " | TOPICAL_CREAM | Freq: Two times a day (BID) | CUTANEOUS | 0 refills | Status: DC

## 2019-07-31 MED ORDER — DEXAMETHASONE SODIUM PHOSPHATE 10 MG/ML IJ SOLN
10.0000 mg | Freq: Once | INTRAMUSCULAR | Status: AC
  Administered 2019-07-31: 10 mg via INTRAMUSCULAR

## 2019-07-31 MED ORDER — ALUM & MAG HYDROXIDE-SIMETH 200-200-20 MG/5ML PO SUSP
30.0000 mL | Freq: Once | ORAL | Status: AC
  Administered 2019-07-31: 30 mL via ORAL

## 2019-07-31 MED ORDER — METOCLOPRAMIDE HCL 5 MG/ML IJ SOLN
5.0000 mg | Freq: Once | INTRAMUSCULAR | Status: AC
  Administered 2019-07-31: 5 mg via INTRAMUSCULAR

## 2019-07-31 MED ORDER — OMEPRAZOLE 20 MG PO CPDR: 20.0000 mg | DELAYED_RELEASE_CAPSULE | Freq: Two times a day (BID) | ORAL | 0 refills | Status: DC

## 2019-07-31 MED ORDER — SUCRALFATE 1 GM/10ML PO SUSP: 1.0000 g | Freq: Three times a day (TID) | ORAL | 0 refills | Status: DC

## 2019-07-31 MED ORDER — DEXAMETHASONE SODIUM PHOSPHATE 10 MG/ML IJ SOLN
INTRAMUSCULAR | Status: AC
  Filled 2019-07-31: qty 1

## 2019-07-31 MED ORDER — ONDANSETRON 4 MG PO TBDP: 4.0000 mg | ORAL_TABLET | Freq: Three times a day (TID) | ORAL | 0 refills | Status: DC | PRN

## 2019-07-31 MED ORDER — LIDOCAINE VISCOUS HCL 2 % MT SOLN
15.0000 mL | Freq: Once | OROMUCOSAL | Status: AC
  Administered 2019-07-31: 15 mL via ORAL

## 2019-07-31 MED ORDER — LIDOCAINE VISCOUS HCL 2 % MT SOLN
OROMUCOSAL | Status: AC
  Filled 2019-07-31: qty 15

## 2019-07-31 MED ORDER — HYDROXYZINE HCL 25 MG PO TABS: 25.0000 mg | ORAL_TABLET | Freq: Four times a day (QID) | ORAL | 0 refills | Status: DC | PRN

## 2019-07-31 NOTE — ED Triage Notes (Signed)
Pt. States she is having discomfort in her chest at times, has been having migraines on & off too. Pt tested POSITIVE for COVID on 07/02/2020, states she is feeling the aftermath of discomfort from it.

## 2019-07-31 NOTE — Discharge Instructions (Addendum)
-   EKG stable, follow-up with cardiology for further evaluation of your palpitations and chest discomfort - We gave you a GI cocktail today which had Maalox and lidocaine, may continue to use Maalox as needed from over-the-counter for further burning symptoms - We gave you Decadron and Reglan today for headache/nausea - Begin omeprazole twice daily before meals - Please also try Carafate before meals and bedtime to help with any underlying ulcer - Please read attached and avoid trigger foods for reflux/acid - May apply triamcinolone to eczema areas, apply no more than twice a day to avoid skin discoloration  If you develop increased chest pain, shortness of breath, leg pain or leg swelling please follow-up in the emergency room

## 2019-08-01 NOTE — ED Provider Notes (Signed)
[facility]    CSN: [id number] Arrival date & time: 07/31/19  1753      History   Chief Complaint Chief Complaint  Patient presents with  . Abdominal Pain    HPI Amy Mendoza is a 39 y.o. female history of asthma, cholecystectomy, recent Covid infection presenting today for evaluation of nausea, chest pain and abdominal discomfort.  Patient tested positive for Covid around 12/17 and was briefly admitted.  Her symptoms largely resolved, but approximately 1 week ago she began to develop an uneasiness and discomfort in her chest.  She was seen in the emergency room last Friday and had work-up with negative troponins, negative CTA-PE, negative chest x-ray, and relatively unremarkable labs.  Symptoms were attributed to chest wall pain versus anxiety.  Over the past week this uneasiness has turned into a burning sensation she feels in her chest as well as upper abdomen has noted frequent belching.  Has used some Pepcid which has helped some but is concerned as has not fully relieved and symptoms persisting.  Does also report occasional palpitations and occasional intense sharp pains.  She also notes an occasional numbness that radiates up to her jaw, poor sleep as well as frequent headaches that are located in her frontal sinus area, occasionally wrapping around head and band distribution.  All of the symptoms have made her very nervous and she is concerned about eating, driving.  Has history of prior ulcers- denies blood in stools, denies recent alochol use, has been using tylneol/ibuprofen.  Denies prior DVT/PE, denies any leg pain or leg swelling.  Denies worsening shortness of breath since evaluation last Friday.  HPI  Past Medical History:  Diagnosis Date  . Asthma   . Gallstones   . Hyperlipemia     Patient Active Problem List   Diagnosis Date Noted  . Obesity, Class III, BMI 40-49.9 (morbid obesity) (HCC) 07/07/2019  . Pneumonia due to COVID-19 virus 07/03/2019  .  Acute respiratory failure with hypoxia (HCC) 07/03/2019  . Mild intermittent asthma 07/03/2019  . Routine general medical examination at a health care facility 01/18/2017    Past Surgical History:  Procedure Laterality Date  . CHOLECYSTECTOMY  feb 2006  . KNEE SURGERY     left    OB History   No obstetric history on file.      Home Medications    Prior to Admission medications   Medication Sig Start Date End Date Taking? Authorizing Provider  Ascorbic Acid (VITAMIN C PO) Take 2 tablets by mouth daily.    [provider]  famotidine (PEPCID) 10 MG tablet Take 10 mg by mouth daily.    [provider]  hydrOXYzine (ATARAX/VISTARIL) 25 MG tablet Take 1 tablet (25 mg total) by mouth every 6 (six) hours as needed for anxiety. 07/31/19   Saige Canton C, PA-C  levonorgestrel (MIRENA) 20 MCG/24HR IUD 1 each by Intrauterine route once.    [provider]  omeprazole (PRILOSEC) 20 MG capsule Take 1 capsule (20 mg total) by mouth 2 (two) times daily before a meal for 14 days. 07/31/19 08/14/19  Gershon Shorten C, PA-C  ondansetron (ZOFRAN ODT) 4 MG disintegrating tablet Take 1 tablet (4 mg total) by mouth every 8 (eight) hours as needed for nausea or vomiting. 07/31/19   Anvi Mangal C, PA-C  sucralfate (CARAFATE) 1 GM/10ML suspension Take 10 mLs (1 g total) by mouth 4 (four) times daily -  with meals and at bedtime. 07/31/19   Lakisa Lotz,  Verle Wheeling C, PA-C  triamcinolone cream (KENALOG) 0.1 % Apply 1 application topically 2 (two) times daily. 07/31/19   Akyah Lagrange C, PA-C  fluticasone (FLONASE) 50 MCG/ACT nasal spray Place 1 spray into both nostrils daily. Patient not taking: Reported on 11/09/2018 07/01/18 04/15/19  Ashley Murrain, NP  potassium chloride SA (K-DUR) 20 MEQ tablet Take 1 tablet (20 mEq total) by mouth 2 (two) times daily for 2 days. Patient not taking: Reported on 07/03/2019 11/09/18 07/31/19  Gareth Morgan, MD    Family History Family History    Problem Relation Age of Onset  . Diabetes Father        MGM, maternal uncle  . Heart failure Father   . Prostate cancer Maternal Grandfather   . Breast cancer Maternal Aunt   . Healthy Mother   . Colon cancer Neg Hx     Social History Social History   Tobacco Use  . Smoking status: Never Smoker  . Smokeless tobacco: Never Used  Substance Use Topics  . Alcohol use: Yes    Comment: occasionally  . Drug use: Yes    Types: Marijuana     Allergies   Patient has no known allergies.   Review of Systems Review of Systems  Constitutional: Negative for fatigue and fever.  HENT: Positive for sinus pressure. Negative for congestion and sore throat.   Eyes: Negative for photophobia, pain and visual disturbance.  Respiratory: Positive for chest tightness. Negative for cough and shortness of breath.   Cardiovascular: Positive for chest pain. Negative for leg swelling.  Gastrointestinal: Positive for abdominal pain and nausea. Negative for vomiting.  Genitourinary: Negative for decreased urine volume and hematuria.  Musculoskeletal: Negative for myalgias, neck pain and neck stiffness.  Neurological: Positive for headaches. Negative for dizziness, syncope, facial asymmetry, speech difficulty, weakness, light-headedness and numbness.  Psychiatric/Behavioral: The patient is nervous/anxious.      Physical Exam Triage Vital Signs ED Triage Vitals  Enc Vitals Group     BP 07/31/19 1829 139/85     Pulse Rate 07/31/19 1829 (!) 105     Resp 07/31/19 1829 18     Temp 07/31/19 1829 98.3 F (36.8 C)     Temp Source 07/31/19 1829 Oral     SpO2 07/31/19 1829 100 %     Weight 07/31/19 1827 270 lb 9.6 oz (122.7 kg)     Height --      Head Circumference --      Peak Flow --      Pain Score 07/31/19 1827 8     Pain Loc --      Pain Edu? --      Excl. in [place]? --    No data found.  Updated Vital Signs BP 139/85 (BP Location: Left Arm)   Pulse (!) 105   Temp 98.3 F (36.8 C) (Oral)    Resp 18   Wt 270 lb 9.6 oz (122.7 kg)   LMP 07/30/2019   SpO2 100%   BMI 39.96 kg/m   Visual Acuity Right Eye Distance:   Left Eye Distance:   Bilateral Distance:    Right Eye Near:   Left Eye Near:    Bilateral Near:     Physical Exam Vitals and nursing note reviewed.  Constitutional:      General: She is not in acute distress.    Appearance: She is well-developed.     Comments: Sitting in chair, no acute distress  HENT:     Head: Normocephalic  and atraumatic.     Mouth/Throat:     Comments: Oral mucosa pink and moist, no tonsillar enlargement or exudate. Posterior pharynx patent and nonerythematous, no uvula deviation or swelling. Normal phonation.  Eyes:     Conjunctiva/sclera: Conjunctivae normal.  Cardiovascular:     Rate and Rhythm: Normal rate and regular rhythm.     Heart sounds: No murmur.  Pulmonary:     Effort: Pulmonary effort is normal. No respiratory distress.     Breath sounds: Normal breath sounds.     Comments: Breathing comfortably at rest, CTABL, no wheezing, rales or other adventitious sounds auscultated  NO anterior chest tenderness to palpation Abdominal:     Palpations: Abdomen is soft.     Tenderness: There is no abdominal tenderness.  Musculoskeletal:     Cervical back: Neck supple.     Comments: Bilateral lower legs symmetric, no calf tenderness or swelling noted  Skin:    General: Skin is warm and dry.  Neurological:     General: No focal deficit present.     Mental Status: She is alert and oriented to person, place, and time. Mental status is at baseline.      UC Treatments / Results  Labs (all labs ordered are listed, but only abnormal results are displayed) Labs Reviewed - No data to display  EKG   Radiology No results found.  Procedures Procedures (including critical care time)  Medications Ordered in UC Medications  alum & mag hydroxide-simeth (MAALOX/MYLANTA) 200-200-20 MG/5ML suspension 30 mL (30 mLs Oral Given  07/31/19 1913)    And  lidocaine (XYLOCAINE) 2 % viscous mouth solution 15 mL (15 mLs Oral Given 07/31/19 1913)  metoCLOPramide (REGLAN) injection 5 mg (5 mg Intramuscular Given 07/31/19 2004)  dexamethasone (DECADRON) injection 10 mg (10 mg Intramuscular Given 07/31/19 2005)    Initial Impression / Assessment and Plan / UC Course  I have reviewed the triage vital signs and the nursing notes.  Pertinent labs & imaging results that were available during my care of the patient were reviewed by me and considered in my medical decision making (see chart for details).     Gi cocktail provided with mild easing of symptoms in chest/stomach. Will initiate on omeprazole 20 mg BID x 2 weeks, as well as carafate before meals and bedtime given history of ulcers. Continue to monitor.   EKG repeated, stable from prior EKG, isolated inversion in lead III, previous troponins negative, no recent worsening. Given reported palpitations and chest pain since COVID, will recommend follow up outpatient with cardiology. Do not suspect PE, recent CTA negative, no worsening of symptoms.   Chest discomfort today most consistent with possible reflux/gastritis and/or anxiety. Continue to monitor, follow up in ED if chest pain changing or worsening, increased SOB.   Provided decadron and reglan for headache today, no red flags, seems likely sinus pressure vs tension headache. Avoiding toradol given possible gastritis/ulcer hx.   Discussed strict return precautions. Patient verbalized understanding and is agreeable with plan.  Final Clinical Impressions(s) / UC Diagnoses   Final diagnoses:  Nausea  Chest pain, unspecified type  Gastroesophageal reflux disease, unspecified whether esophagitis present  Acute non intractable tension-type headache     Discharge Instructions     - EKG stable, follow-up with cardiology for further evaluation of your palpitations and chest discomfort - We gave you a GI cocktail today  which had Maalox and lidocaine, may continue to use Maalox as needed from over-the-counter for further burning symptoms -  We gave you Decadron and Reglan today for headache/nausea - Begin omeprazole twice daily before meals - Please also try Carafate before meals and bedtime to help with any underlying ulcer - Please read attached and avoid trigger foods for reflux/acid - May apply triamcinolone to eczema areas, apply no more than twice a day to avoid skin discoloration  If you develop increased chest pain, shortness of breath, leg pain or leg swelling please follow-up in the emergency room   ED Prescriptions    Medication Sig Dispense Auth. Provider   omeprazole (PRILOSEC) 20 MG capsule Take 1 capsule (20 mg total) by mouth 2 (two) times daily before a meal for 14 days. 28 capsule Saarah Dewing C, PA-C   sucralfate (CARAFATE) 1 GM/10ML suspension Take 10 mLs (1 g total) by mouth 4 (four) times daily -  with meals and at bedtime. 420 mL Aprile Dickenson C, PA-C   ondansetron (ZOFRAN ODT) 4 MG disintegrating tablet Take 1 tablet (4 mg total) by mouth every 8 (eight) hours as needed for nausea or vomiting. 20 tablet Davy Faught C, PA-C   triamcinolone cream (KENALOG) 0.1 % Apply 1 application topically 2 (two) times daily. 80 g Bush Murdoch C, PA-C   hydrOXYzine (ATARAX/VISTARIL) 25 MG tablet Take 1 tablet (25 mg total) by mouth every 6 (six) hours as needed for anxiety. 24 tablet Simrin Vegh, [place] C, PA-C     PDMP not reviewed this encounter.   Lew Dawes, PA-C 08/01/19 1152

## 2019-08-10 ENCOUNTER — Other Ambulatory Visit: Payer: Self-pay

## 2019-08-10 ENCOUNTER — Emergency Department (HOSPITAL_COMMUNITY): Payer: BLUE CROSS/BLUE SHIELD

## 2019-08-10 ENCOUNTER — Encounter (HOSPITAL_COMMUNITY): Payer: Self-pay | Admitting: Emergency Medicine

## 2019-08-10 ENCOUNTER — Emergency Department (HOSPITAL_COMMUNITY)
Admission: EM | Admit: 2019-08-10 | Discharge: 2019-08-10 | Disposition: A | Discharge status: Home / Self Care | Payer: BLUE CROSS/BLUE SHIELD | Attending: Emergency Medicine | Admitting: Emergency Medicine

## 2019-08-10 DIAGNOSIS — R0781 Pleurodynia: Secondary | ICD-10-CM | POA: Insufficient documentation

## 2019-08-10 DIAGNOSIS — Z8709 Personal history of other diseases of the respiratory system: Secondary | ICD-10-CM | POA: Insufficient documentation

## 2019-08-10 DIAGNOSIS — R002 Palpitations: Secondary | ICD-10-CM | POA: Insufficient documentation

## 2019-08-10 DIAGNOSIS — Z79899 Other long term (current) drug therapy: Secondary | ICD-10-CM | POA: Diagnosis not present

## 2019-08-10 DIAGNOSIS — R1013 Epigastric pain: Secondary | ICD-10-CM | POA: Diagnosis not present

## 2019-08-10 DIAGNOSIS — R0609 Other forms of dyspnea: Secondary | ICD-10-CM | POA: Insufficient documentation

## 2019-08-10 LAB — CBC
HCT: 42 % (ref 36.0–46.0)
Hemoglobin: 13.8 g/dL (ref 12.0–15.0)
MCH: 28.4 pg (ref 26.0–34.0)
MCHC: 32.9 g/dL (ref 30.0–36.0)
MCV: 86.4 fL (ref 80.0–100.0)
Platelets: 360 10*3/uL (ref 150–400)
RBC: 4.86 MIL/uL (ref 3.87–5.11)
RDW: 13.3 % (ref 11.5–15.5)
WBC: 6 10*3/uL (ref 4.0–10.5)
nRBC: 0 % (ref 0.0–0.2)

## 2019-08-10 LAB — TSH: TSH: 0.767 u[IU]/mL (ref 0.350–4.500)

## 2019-08-10 LAB — BASIC METABOLIC PANEL
Anion gap: 10 (ref 5–15)
BUN: <5 mg/dL — ABNORMAL LOW (ref 6–20)
CO2: 22 mmol/L (ref 22–32)
Calcium: 9 mg/dL (ref 8.9–10.3)
Chloride: 105 mmol/L (ref 98–111)
Creatinine, Ser: 0.83 mg/dL (ref 0.44–1.00)
GFR calc Af Amer: >60 mL/min (ref >60)
GFR calc non Af Amer: >60 mL/min (ref >60)
Glucose, Bld: 125 mg/dL — ABNORMAL HIGH (ref 70–99)
Potassium: 3.6 mmol/L (ref 3.5–5.1)
Sodium: 137 mmol/L (ref 135–145)

## 2019-08-10 LAB — TROPONIN I (HIGH SENSITIVITY)
Troponin I (High Sensitivity): <2 ng/L (ref <18)
Troponin I (High Sensitivity): <2 ng/L (ref <18)

## 2019-08-10 LAB — I-STAT BETA HCG BLOOD, ED (MC, WL, AP ONLY): I-stat hCG, quantitative: <5 m[IU]/mL (ref <5)

## 2019-08-10 LAB — MAGNESIUM: Magnesium: 1.8 mg/dL (ref 1.7–2.4)

## 2019-08-10 MED ORDER — IOHEXOL 350 MG/ML SOLN
100.0000 mL | Freq: Once | INTRAVENOUS | Status: AC | PRN
  Administered 2019-08-10: 100 mL via INTRAVENOUS

## 2019-08-10 NOTE — ED Provider Notes (Signed)
[facility] Provider Note   CSN: [id number] Arrival date & time: 08/10/19  1614     History Chief Complaint  Patient presents with  . Palpitations    Amy M[facility]za is a 39 y.o. female.  HPI 39 year old female with history of hyperlipidemia, gallstones, asthma, presenting to the emergency [facility] for a 2-week history of intermittent upper abdominal pain, centralized chest pain, as well as belching and nausea.  Patient states she has had intermittent chest pain as well as palpitations over the past few weeks since her Covid diagnosis, states she has some mild shortness of breath, centralized chest pain that does not radiate, nonexertional, also states that she sometimes has some belching as well as associated epigastric burning and centralized chest burning that seems to be relieved sometimes with belching.  Denies any diarrhea, no abdominal pain, no vomiting, denies any hemoptysis or hematemesis, no leg swelling.  Patient states that she was admitted secondary to her Covid proximately 3 weeks ago.    Past Medical History:  Diagnosis Date  . Asthma   . Gallstones   . Hyperlipemia     Patient Active Problem List   Diagnosis Date Noted  . Obesity, Class III, BMI 40-49.9 (morbid obesity) ([facility]) 07/07/2019  . Pneumonia due to COVID-19 virus 07/03/2019  . Acute respiratory failure with hypoxia ([facility]) 07/03/2019  . Mild intermittent asthma 07/03/2019  . Routine general medical examination at a health care facility 01/18/2017    Past Surgical History:  Procedure Laterality Date  . CHOLECYSTECTOMY  feb 2006  . KNEE SURGERY     left     OB History   No obstetric history on file.     Family History  Problem Relation Age of Onset  . Diabetes Father        MGM, maternal uncle  . Heart failure Father   . Prostate cancer Maternal Grandfather   . Breast cancer Maternal Aunt   . Healthy Mother   . Colon cancer Neg Hx     Social  History   Tobacco Use  . Smoking status: Never Smoker  . Smokeless tobacco: Never Used  Substance Use Topics  . Alcohol use: Yes    Comment: occasionally  . Drug use: Yes    Types: Marijuana    Home Medications Prior to Admission medications   Medication Sig Start Date End Date Taking? Authorizing Provider  Ascorbic Acid (VITAMIN C PO) Take 2 tablets by mouth daily.    [provider]  famotidine (PEPCID) 10 MG tablet Take 10 mg by mouth daily.    [provider]  hydrOXYzine (ATARAX/VISTARIL) 25 MG tablet Take 1 tablet (25 mg total) by mouth every 6 (six) hours as needed for anxiety. 07/31/19   Wieters, Hallie C, PA-C  levonorgestrel (MIRENA) 20 MCG/24HR IUD 1 each by Intrauterine route once.    [provider]  omeprazole (PRILOSEC) 20 MG capsule Take 1 capsule (20 mg total) by mouth 2 (two) times daily before a meal for 14 days. 07/31/19 08/14/19  Wieters, Hallie C, PA-C  ondansetron (ZOFRAN ODT) 4 MG disintegrating tablet Take 1 tablet (4 mg total) by mouth every 8 (eight) hours as needed for nausea or vomiting. 07/31/19   Wieters, Hallie C, PA-C  sucralfate (CARAFATE) 1 GM/10ML suspension Take 10 mLs (1 g total) by mouth 4 (four) times daily -  with meals and at bedtime. 07/31/19   Wieters, Hallie C, PA-C  triamcinolone cream (KENALOG) 0.1 % Apply  1 application topically 2 (two) times daily. 07/31/19   Wieters, Hallie C, PA-C  fluticasone (FLONASE) 50 MCG/ACT nasal spray Place 1 spray into both nostrils daily. Patient not taking: Reported on 11/09/2018 07/01/18 04/15/19  Janne Napoleon, NP  potassium chloride SA (K-DUR) 20 MEQ tablet Take 1 tablet (20 mEq total) by mouth 2 (two) times daily for 2 days. Patient not taking: Reported on 07/03/2019 11/09/18 07/31/19  Alvira Monday, MD    Allergies    Patient has no known allergies.  Review of Systems   Review of Systems  Constitutional: Negative for chills and fever.  HENT: Negative for ear pain and sore throat.    Eyes: Negative for pain and visual disturbance.  Respiratory: Positive for shortness of breath. Negative for cough.   Cardiovascular: Positive for chest pain. Negative for palpitations.  Gastrointestinal: Positive for abdominal pain and nausea. Negative for vomiting.  Genitourinary: Negative for dysuria and hematuria.  Musculoskeletal: Negative for arthralgias and back pain.  Skin: Negative for color change and rash.  Neurological: Negative for seizures and syncope.  All other systems reviewed and are negative.   Physical Exam Updated Vital Signs BP 116/74   Pulse 84   Temp 98.1 F (36.7 C) (Oral)   Resp (!) 21   LMP 07/30/2019   SpO2 99%   Physical Exam Vitals and nursing note reviewed.  Constitutional:      General: She is not in acute distress.    Appearance: She is well-developed.  HENT:     Head: Normocephalic and atraumatic.     Right Ear: External ear normal. There is no impacted cerumen.     Left Ear: External ear normal. There is no impacted cerumen.     Nose: Nose normal. No congestion.     Mouth/Throat:     Mouth: Mucous membranes are moist.     Pharynx: Oropharynx is clear.  Eyes:     Conjunctiva/sclera: Conjunctivae normal.  Cardiovascular:     Rate and Rhythm: Regular rhythm. Tachycardia present.     Heart sounds: No murmur.  Pulmonary:     Effort: Pulmonary effort is normal. No respiratory distress.     Breath sounds: Normal breath sounds.  Abdominal:     General: There is no distension.     Palpations: Abdomen is soft. There is no mass.     Tenderness: There is no abdominal tenderness. There is no guarding or rebound.     Hernia: No hernia is present.  Musculoskeletal:     Cervical back: Neck supple.  Skin:    General: Skin is warm and dry.  Neurological:     General: No focal deficit present.     Mental Status: She is alert and oriented to person, place, and time. Mental status is at baseline.  Psychiatric:        Mood and Affect: Mood  normal.        Behavior: Behavior normal.     ED Results / Procedures / Treatments   Labs (all labs ordered are listed, but only abnormal results are displayed) Labs Reviewed  BASIC METABOLIC PANEL - Abnormal; Notable for the following components:      Result Value   Glucose, Bld 125 (*)    BUN <5 (*)    All other components within normal limits  CBC  TSH  MAGNESIUM  I-STAT BETA HCG BLOOD, ED ([facility], [facility], [facility] ONLY)  TROPONIN I (HIGH SENSITIVITY)  TROPONIN I (HIGH SENSITIVITY)    EKG None  Radiology DG Chest 2 View  Result Date: 08/10/2019 CLINICAL DATA:  Chest pain and shortness of breath. EXAM: CHEST - 2 VIEW COMPARISON:  July 25, 2019 FINDINGS: The heart size and mediastinal contours are within normal limits. Both lungs are clear. The visualized skeletal structures are unremarkable. IMPRESSION: No active cardiopulmonary disease. Electronically Signed   By: Aram Candela M.D.   On: 08/10/2019 17:35   CT Angio Chest PE W and/or Wo Contrast  Result Date: 08/10/2019 CLINICAL DATA:  Shortness of breath. EXAM: CT ANGIOGRAPHY CHEST WITH CONTRAST TECHNIQUE: Multidetector CT imaging of the chest was performed using the standard protocol during bolus administration of intravenous contrast. Multiplanar CT image reconstructions and MIPs were obtained to evaluate the vascular anatomy. CONTRAST:  OMNIPAQUE IOHEXOL 350 MG/ML SOLN COMPARISON:  July 25, 2019 FINDINGS: Cardiovascular: Satisfactory opacification of the pulmonary arteries to the segmental level. No evidence of pulmonary embolism. Normal heart size. No pericardial effusion. Mediastinum/Nodes: No enlarged mediastinal, hilar, or axillary lymph nodes. Thyroid gland, trachea, and esophagus demonstrate no significant findings. Lungs/Pleura: Lungs are clear. No pleural effusion or pneumothorax. Upper Abdomen: No acute abnormality. Musculoskeletal: No chest wall abnormality. No acute or significant osseous findings. Review of the MIP  images confirms the above findings. IMPRESSION: No CT evidence of pulmonary embolism or active cardiopulmonary disease. Electronically Signed   By: Aram Candela M.D.   On: 08/10/2019 18:59    Procedures Procedures (including critical care time)  Medications Ordered in ED Medications  iohexol (OMNIPAQUE) 350 MG/ML injection 100 mL (100 mLs Intravenous Contrast Given 08/10/19 1846)    ED Course  I have reviewed the triage vital signs and the nursing notes.  Pertinent labs & imaging results that were available during my care of the patient were reviewed by me and considered in my medical decision making (see chart for details).    MDM Rules/Calculators/A&P                      39 year old female presenting to the emergency [facility] for shortness of breath, intermittent chest pain and nausea for the past few weeks after Covid infection.  Patient on arrival was hemodynamically stable, afebrile and well-appearing, patient was tachycardic to 120 initially, which decreased to 95 after initial evaluation.  Given her recent history of Covid, concern for PE, CTA of the chest was obtained.  Patient's EKG did reveal some T wave inversions in the inferior leads which were baseline for patient, however there were some small T wave changes noted in the lateral leads as well, initial troponin was less than 2.  Given normal troponin, and atypical story, doubt ACS at this time.  Patient will need to follow-up with cardiology regardless.  Given her EKG changes.  Chest x-ray was also obtained, reassuring with no focal abnormalities noted.  Abdominal exam was benign, no Murphy sign, doubt biliary pathology at this time, patient states that she has had some episodes of chest burning that relieved with belching, could be element of GERD that is causing her symptoms.  Also post Covid syndrome could also be causing her symptoms as well. Pending CT at this time.  TSH normal, metabolic panel reassuring as well, CBC  reassuring.  CT scan negative for PE, delta troponin negative as well.  With a reassuring troponin and labs, CT scan as well, doubt PE, patient will follow up with cardiology for further evaluation, possible stress test.  Patient currently states her symptoms are improving, likely pleurisy secondary to recent  Covid infection.  Patient was given strict return precautions as well as need for close follow-up, she agreed and understood plan.  Patient remained stable emergency [facility], no further episodes of tachycardia, hemodynamically stable and afebrile.  Well-appearing.  Discharged home in good condition.  The attending physician was present and available for all medical decision making and procedures related to this patient's care.      Final Clinical Impression(s) / ED Diagnoses Final diagnoses:  Palpitations  Pleurodynia    Rx / DC Orders ED Discharge Orders    None       Jamey Reas, MD 08/10/19 2039    Blane Ohara, MD 08/10/19 2300

## 2019-08-10 NOTE — ED Notes (Signed)
Patient transported to X-ray 

## 2019-08-10 NOTE — ED Triage Notes (Signed)
Pt reports constipation, burping frequently and feels like she has trapped gas. Endorses weakness at times. States it feels like her heart is beating out of her chest causing SOB.

## 2019-08-10 NOTE — Discharge Instructions (Signed)
You are seen in the emergency department for chest pain and nausea.  Your labs are reassuring, chest x-ray normal, troponin normal x2, CT of the chest was normal, no signs of blood clot in your lungs.  Please follow-up with a cardiologist for continued management of your atypical chest pain.  Could be secondary to your Covid infection.  See your PCP for further symptoms, return to the emergency department for any worsening fevers, trauma, chest pain or shortness of breath

## 2019-08-12 ENCOUNTER — Emergency Department (HOSPITAL_COMMUNITY)
Admission: EM | Admit: 2019-08-12 | Discharge: 2019-08-13 | Disposition: A | Discharge status: Home / Self Care | Payer: BLUE CROSS/BLUE SHIELD | Attending: Emergency Medicine | Admitting: Emergency Medicine

## 2019-08-12 ENCOUNTER — Emergency Department (HOSPITAL_COMMUNITY): Payer: BLUE CROSS/BLUE SHIELD

## 2019-08-12 ENCOUNTER — Other Ambulatory Visit: Payer: Self-pay

## 2019-08-12 ENCOUNTER — Encounter (HOSPITAL_COMMUNITY): Payer: Self-pay | Admitting: Emergency Medicine

## 2019-08-12 DIAGNOSIS — Z79899 Other long term (current) drug therapy: Secondary | ICD-10-CM | POA: Diagnosis not present

## 2019-08-12 DIAGNOSIS — R1084 Generalized abdominal pain: Secondary | ICD-10-CM | POA: Diagnosis present

## 2019-08-12 DIAGNOSIS — Z8709 Personal history of other diseases of the respiratory system: Secondary | ICD-10-CM | POA: Insufficient documentation

## 2019-08-12 LAB — CBC
HCT: 41 % (ref 36.0–46.0)
Hemoglobin: 12.9 g/dL (ref 12.0–15.0)
MCH: 27.4 pg (ref 26.0–34.0)
MCHC: 31.5 g/dL (ref 30.0–36.0)
MCV: 87.2 fL (ref 80.0–100.0)
Platelets: 355 10*3/uL (ref 150–400)
RBC: 4.7 MIL/uL (ref 3.87–5.11)
RDW: 13.3 % (ref 11.5–15.5)
WBC: 6.7 10*3/uL (ref 4.0–10.5)
nRBC: 0 % (ref 0.0–0.2)

## 2019-08-12 LAB — BASIC METABOLIC PANEL
Anion gap: 8 (ref 5–15)
BUN: <5 mg/dL — ABNORMAL LOW (ref 6–20)
CO2: 26 mmol/L (ref 22–32)
Calcium: 9.1 mg/dL (ref 8.9–10.3)
Chloride: 107 mmol/L (ref 98–111)
Creatinine, Ser: 0.69 mg/dL (ref 0.44–1.00)
GFR calc Af Amer: >60 mL/min (ref >60)
GFR calc non Af Amer: >60 mL/min (ref >60)
Glucose, Bld: 104 mg/dL — ABNORMAL HIGH (ref 70–99)
Potassium: 3.4 mmol/L — ABNORMAL LOW (ref 3.5–5.1)
Sodium: 141 mmol/L (ref 135–145)

## 2019-08-12 LAB — I-STAT BETA HCG BLOOD, ED (MC, WL, AP ONLY): I-stat hCG, quantitative: <5 m[IU]/mL (ref <5)

## 2019-08-12 LAB — TROPONIN I (HIGH SENSITIVITY): Troponin I (High Sensitivity): <2 ng/L (ref <18)

## 2019-08-12 MED ORDER — SODIUM CHLORIDE 0.9% FLUSH
3.0000 mL | Freq: Once | INTRAVENOUS | Status: AC
  Administered 2019-08-12: 3 mL via INTRAVENOUS

## 2019-08-12 NOTE — ED Triage Notes (Signed)
Patient comes in complaining of abdominal pain and chest pain that she was seen here for 2 days ago. Patient states symptoms are the same with no improvement. Patient states she been attempting to fix it at home with no help. Patient states that she is belching a lot and having a lot of gas still. States she took a laxative on Friday.

## 2019-08-12 NOTE — ED Provider Notes (Signed)
[facility]-EMERGENCY DEPT Provider Note   CSN: [id number] Arrival date & time: 08/12/19  2017     History Chief Complaint  Patient presents with  . Abdominal Pain  . Chest Pain    Amy [facility] is a 39 y.o. female.  Patient presents to the emergency department with a chief complaint of abdominal pain.  She states that she was seen approximately 2 days ago for the same.  She states that she has been having anxiety, heart palpitations, and a lot of gas.  She denies any fevers or chills.  Denies any vomiting or diarrhea.  Per EMR, recent work-up included CT PE study which was negative.  She was admitted for Covid back in December.  States that she continues to feel fatigued from this.  The history is provided by the patient. No language interpreter was used.       Past Medical History:  Diagnosis Date  . Asthma   . Gallstones   . Hyperlipemia     Patient Active Problem List   Diagnosis Date Noted  . Obesity, Class III, BMI 40-49.9 (morbid obesity) ([facility]) 07/07/2019  . Pneumonia due to COVID-19 virus 07/03/2019  . Acute respiratory failure with hypoxia ([facility]) 07/03/2019  . Mild intermittent asthma 07/03/2019  . Routine general medical examination at a health care facility 01/18/2017    Past Surgical History:  Procedure Laterality Date  . CHOLECYSTECTOMY  feb 2006  . KNEE SURGERY     left     OB History   No obstetric history on file.     Family History  Problem Relation Age of Onset  . Diabetes Father        MGM, maternal uncle  . Heart failure Father   . Prostate cancer Maternal Grandfather   . Breast cancer Maternal Aunt   . Healthy Mother   . Colon cancer Neg Hx     Social History   Tobacco Use  . Smoking status: Never Smoker  . Smokeless tobacco: Never Used  Substance Use Topics  . Alcohol use: Yes    Comment: occasionally  . Drug use: Yes    Types: Marijuana    Home Medications Prior to Admission medications     Medication Sig Start Date End Date Taking? Authorizing Provider  Ascorbic Acid (VITAMIN C PO) Take 2 tablets by mouth daily.   Yes [provider]  hydrOXYzine (ATARAX/VISTARIL) 25 MG tablet Take 1 tablet (25 mg total) by mouth every 6 (six) hours as needed for anxiety. 07/31/19  Yes Wieters, Hallie C, PA-C  levonorgestrel (MIRENA) 20 MCG/24HR IUD 1 each by Intrauterine route once.   Yes [provider]  omeprazole (PRILOSEC) 20 MG capsule Take 1 capsule (20 mg total) by mouth 2 (two) times daily before a meal for 14 days. 07/31/19 08/14/19 Yes Wieters, Hallie C, PA-C  ondansetron (ZOFRAN ODT) 4 MG disintegrating tablet Take 1 tablet (4 mg total) by mouth every 8 (eight) hours as needed for nausea or vomiting. 07/31/19  Yes Wieters, Hallie C, PA-C  sucralfate (CARAFATE) 1 GM/10ML suspension Take 10 mLs (1 g total) by mouth 4 (four) times daily -  with meals and at bedtime. 07/31/19  Yes Wieters, Hallie C, PA-C  triamcinolone cream (KENALOG) 0.1 % Apply 1 application topically 2 (two) times daily. 07/31/19  Yes Wieters, Hallie C, PA-C  fluticasone (FLONASE) 50 MCG/ACT nasal spray Place 1 spray into both nostrils daily. Patient not taking: Reported on 11/09/2018 07/01/18 04/15/19  Damian Leavell,  Hope M, NP  potassium chloride SA (K-DUR) 20 MEQ tablet Take 1 tablet (20 mEq total) by mouth 2 (two) times daily for 2 days. Patient not taking: Reported on 07/03/2019 11/09/18 07/31/19  Alvira Monday, MD    Allergies    Patient has no known allergies.  Review of Systems   Review of Systems  All other systems reviewed and are negative.   Physical Exam Updated Vital Signs BP (!) 165/84   Pulse 63   Temp 98.2 F (36.8 C) (Oral)   Resp 15   Ht 5\' 9"  (1.753 m)   Wt 122.9 kg   LMP 07/30/2019   SpO2 96%   BMI 40.02 kg/m   Physical Exam Vitals and nursing note reviewed.  Constitutional:      General: She is not in acute distress.    Appearance: She is well-developed.  HENT:     Head:  Normocephalic and atraumatic.  Eyes:     Conjunctiva/sclera: Conjunctivae normal.  Cardiovascular:     Rate and Rhythm: Normal rate and regular rhythm.     Heart sounds: No murmur.  Pulmonary:     Effort: Pulmonary effort is normal. No respiratory distress.     Breath sounds: Normal breath sounds.  Abdominal:     Palpations: Abdomen is soft.     Tenderness: There is no abdominal tenderness.     Comments: No focal abdominal tenderness, no RLQ tenderness or pain at McBurney's point, no RUQ tenderness or Murphy's sign, no left-sided abdominal tenderness, no fluid wave, or signs of peritonitis   Musculoskeletal:        General: Normal range of motion.     Cervical back: Neck supple.  Skin:    General: Skin is warm and dry.  Neurological:     Mental Status: She is alert and oriented to person, place, and time.  Psychiatric:        Mood and Affect: Mood normal.        Behavior: Behavior normal.     ED Results / Procedures / Treatments   Labs (all labs ordered are listed, but only abnormal results are displayed) Labs Reviewed  BASIC METABOLIC PANEL - Abnormal; Notable for the following components:      Result Value   Potassium 3.4 (*)    Glucose, Bld 104 (*)    BUN <5 (*)    All other components within normal limits  CBC  I-STAT BETA HCG BLOOD, ED ([facility], [facility], [facility] ONLY)  TROPONIN I (HIGH SENSITIVITY)  TROPONIN I (HIGH SENSITIVITY)    EKG None  Radiology No results found.  Procedures Procedures (including critical care time)  Medications Ordered in ED Medications  sodium chloride flush (NS) 0.9 % injection 3 mL (3 mLs Intravenous Given 08/12/19 2234)    ED Course  I have reviewed the triage vital signs and the nursing notes.  Pertinent labs & imaging results that were available during my care of the patient were reviewed by me and considered in my medical decision making (see chart for details).    MDM Rules/Calculators/A&P                      Patient with  generalized abdominal pain.  She states that she feels very gassy.  Vital signs are stable.  Potassium is 3.4, will give potassium replacement.  Otherwise, laboratory work-up is normal.  She states that she has had some intermittent palpitations, but here, her EKG shows normal sinus rhythm.  Patient is in no acute distress.  I will check plain films, but if no evidence of obstruction, which I believe to be unlikely given how well the patient looks, I believe that she can be discharged home.  She discontinued Carafate and Prilosec on her own, I have recommended that she continue this.  Recommend continuing Gas-X.  Believe that she is safe for outpatient follow-up with gastroenterology for her stomach problems, and may benefit from cardiology follow-up for her palpitations.  No evidence of obstruction on plain films.  Emergent cause of the symptoms has been found tonight.  Do not feel that any additional emergent work-up is indicated.   Final Clinical Impression(s) / ED Diagnoses Final diagnoses:  Generalized abdominal pain    Rx / DC Orders ED Discharge Orders    None       Montine Circle, PA-C 08/13/19 0005    Charlesetta Shanks, MD 08/15/19 [phone]

## 2019-08-12 NOTE — Progress Notes (Signed)
Tried x2 getting blood for second troponin. Asked for another RN/NT to draw.

## 2019-08-13 MED ORDER — POTASSIUM CHLORIDE CRYS ER 20 MEQ PO TBCR
40.0000 meq | EXTENDED_RELEASE_TABLET | Freq: Once | ORAL | Status: AC
  Administered 2019-08-13: 40 meq via ORAL
  Filled 2019-08-13: qty 2

## 2019-08-13 NOTE — Discharge Instructions (Addendum)
Please resume taking your omeprazole and Carafate.  You may continue the Gas-X.  Please follow-up with a gastroenterologist, contact information has been listed.  Your x-ray and laboratory work-up did not reveal any emergent cause of your symptoms.  Please return for fever, focal abdominal pain, bloody vomit, bloody diarrhea, or any other symptoms that concern you.

## 2019-08-20 ENCOUNTER — Encounter (HOSPITAL_COMMUNITY): Payer: Self-pay

## 2019-08-20 ENCOUNTER — Other Ambulatory Visit: Payer: Self-pay

## 2019-08-20 ENCOUNTER — Emergency Department (HOSPITAL_COMMUNITY)
Admission: EM | Admit: 2019-08-20 | Discharge: 2019-08-20 | Disposition: A | Discharge status: Home / Self Care | Payer: BLUE CROSS/BLUE SHIELD | Attending: Emergency Medicine | Admitting: Emergency Medicine

## 2019-08-20 ENCOUNTER — Emergency Department (HOSPITAL_COMMUNITY): Payer: BLUE CROSS/BLUE SHIELD

## 2019-08-20 DIAGNOSIS — Z79899 Other long term (current) drug therapy: Secondary | ICD-10-CM | POA: Insufficient documentation

## 2019-08-20 DIAGNOSIS — R42 Dizziness and giddiness: Secondary | ICD-10-CM | POA: Insufficient documentation

## 2019-08-20 DIAGNOSIS — R519 Headache, unspecified: Secondary | ICD-10-CM | POA: Diagnosis not present

## 2019-08-20 DIAGNOSIS — R1084 Generalized abdominal pain: Secondary | ICD-10-CM | POA: Diagnosis present

## 2019-08-20 DIAGNOSIS — M62838 Other muscle spasm: Secondary | ICD-10-CM | POA: Insufficient documentation

## 2019-08-20 LAB — CBC
HCT: 41.8 % (ref 36.0–46.0)
Hemoglobin: 13.3 g/dL (ref 12.0–15.0)
MCH: 27.7 pg (ref 26.0–34.0)
MCHC: 31.8 g/dL (ref 30.0–36.0)
MCV: 86.9 fL (ref 80.0–100.0)
Platelets: 267 10*3/uL (ref 150–400)
RBC: 4.81 MIL/uL (ref 3.87–5.11)
RDW: 13.2 % (ref 11.5–15.5)
WBC: 7.3 10*3/uL (ref 4.0–10.5)
nRBC: 0 % (ref 0.0–0.2)

## 2019-08-20 LAB — LIPASE, BLOOD: Lipase: 24 U/L (ref 11–51)

## 2019-08-20 LAB — URINALYSIS, ROUTINE W REFLEX MICROSCOPIC
Bilirubin Urine: NEGATIVE
Glucose, UA: NEGATIVE mg/dL
Hgb urine dipstick: NEGATIVE
Ketones, ur: 5 mg/dL — AB
Leukocytes,Ua: NEGATIVE
Nitrite: NEGATIVE
Protein, ur: NEGATIVE mg/dL
Specific Gravity, Urine: 1.021 (ref 1.005–1.030)
pH: 6 (ref 5.0–8.0)

## 2019-08-20 LAB — I-STAT BETA HCG BLOOD, ED (MC, WL, AP ONLY): I-stat hCG, quantitative: <5 m[IU]/mL (ref <5)

## 2019-08-20 LAB — COMPREHENSIVE METABOLIC PANEL
ALT: 18 U/L (ref 0–44)
AST: 13 U/L — ABNORMAL LOW (ref 15–41)
Albumin: 3.7 g/dL (ref 3.5–5.0)
Alkaline Phosphatase: 55 U/L (ref 38–126)
Anion gap: 9 (ref 5–15)
BUN: 5 mg/dL — ABNORMAL LOW (ref 6–20)
CO2: 25 mmol/L (ref 22–32)
Calcium: 9.2 mg/dL (ref 8.9–10.3)
Chloride: 105 mmol/L (ref 98–111)
Creatinine, Ser: 0.77 mg/dL (ref 0.44–1.00)
GFR calc Af Amer: >60 mL/min (ref >60)
GFR calc non Af Amer: >60 mL/min (ref >60)
Glucose, Bld: 94 mg/dL (ref 70–99)
Potassium: 3.7 mmol/L (ref 3.5–5.1)
Sodium: 139 mmol/L (ref 135–145)
Total Bilirubin: 1.6 mg/dL — ABNORMAL HIGH (ref 0.3–1.2)
Total Protein: 7.1 g/dL (ref 6.5–8.1)

## 2019-08-20 MED ORDER — LORAZEPAM 1 MG PO TABS
1.0000 mg | ORAL_TABLET | Freq: Once | ORAL | Status: AC
  Administered 2019-08-20: 1 mg via ORAL
  Filled 2019-08-20: qty 1

## 2019-08-20 MED ORDER — CYCLOBENZAPRINE HCL 10 MG PO TABS: 10.0000 mg | ORAL_TABLET | Freq: Two times a day (BID) | ORAL | 0 refills | Status: DC | PRN

## 2019-08-20 MED ORDER — SODIUM CHLORIDE 0.9% FLUSH: 3.0000 mL | Freq: Once | INTRAVENOUS | Status: DC

## 2019-08-20 MED ORDER — AMOXICILLIN-POT CLAVULANATE 875-125 MG PO TABS: 1.0000 | ORAL_TABLET | Freq: Two times a day (BID) | ORAL | 0 refills | Status: DC

## 2019-08-20 MED ORDER — IOHEXOL 300 MG/ML  SOLN
100.0000 mL | Freq: Once | INTRAMUSCULAR | Status: AC | PRN
  Administered 2019-08-20: 22:00:00 100 mL via INTRAVENOUS

## 2019-08-20 MED ORDER — OMEPRAZOLE 20 MG PO CPDR: 20.0000 mg | DELAYED_RELEASE_CAPSULE | Freq: Two times a day (BID) | ORAL | 0 refills | Status: DC

## 2019-08-20 MED ORDER — ONDANSETRON 4 MG PO TBDP
4.0000 mg | ORAL_TABLET | Freq: Once | ORAL | Status: AC
  Administered 2019-08-20: 4 mg via ORAL
  Filled 2019-08-20: qty 1

## 2019-08-20 NOTE — ED Provider Notes (Signed)
[facility] EMERGENCY DEPARTMENT Provider Note   CSN: [id number] Arrival date & time: 08/20/19  1756     History Chief Complaint  Patient presents with  . Abdominal Cramping    Amy Mendoza is a 39 y.o. female.  HPI   Pt has been having abdominal contractions for the last few days.  This spasms come and go.  She also has been having trouble with headaches and feeling lightheaded.  Pt feels a pressure in her abdomen and then it might resolve after 15 to 20 minutes.  She has had a lot of belching, sometimes that helps.  No vomiting.  No fever.  No diarrhea or constipation recently but she did have some before.  Past Medical History:  Diagnosis Date  . Asthma   . Gallstones   . Hyperlipemia     Patient Active Problem List   Diagnosis Date Noted  . Obesity, Class III, BMI 40-49.9 (morbid obesity) ([place]) 07/07/2019  . Pneumonia due to COVID-19 virus 07/03/2019  . Acute respiratory failure with hypoxia ([place]) 07/03/2019  . Mild intermittent asthma 07/03/2019  . Routine general medical examination at a health care facility 01/18/2017    Past Surgical History:  Procedure Laterality Date  . CHOLECYSTECTOMY  feb 2006  . KNEE SURGERY     left     OB History   No obstetric history on file.     Family History  Problem Relation Age of Onset  . Diabetes Father        MGM, maternal uncle  . Heart failure Father   . Prostate cancer Maternal Grandfather   . Breast cancer Maternal Aunt   . Healthy Mother   . Colon cancer Neg Hx     Social History   Tobacco Use  . Smoking status: Never Smoker  . Smokeless tobacco: Never Used  Substance Use Topics  . Alcohol use: Yes    Comment: occasionally  . Drug use: Yes    Types: Marijuana    Home Medications Prior to Admission medications   Medication Sig Start Date End Date Taking? Authorizing Provider  amoxicillin-clavulanate (AUGMENTIN) 875-125 MG tablet Take 1 tablet by mouth 2 (two) times daily.  08/20/19   Dorie Rank, MD  Ascorbic Acid (VITAMIN C PO) Take 2 tablets by mouth daily.    [provider]  cyclobenzaprine (FLEXERIL) 10 MG tablet Take 1 tablet (10 mg total) by mouth 2 (two) times daily as needed for muscle spasms. 08/20/19   Dorie Rank, MD  hydrOXYzine (ATARAX/VISTARIL) 25 MG tablet Take 1 tablet (25 mg total) by mouth every 6 (six) hours as needed for anxiety. 07/31/19   Wieters, Hallie C, PA-C  levonorgestrel (MIRENA) 20 MCG/24HR IUD 1 each by Intrauterine route once.    [provider]  omeprazole (PRILOSEC) 20 MG capsule Take 1 capsule (20 mg total) by mouth 2 (two) times daily before a meal for 14 days. 08/20/19 09/03/19  Dorie Rank, MD  ondansetron (ZOFRAN ODT) 4 MG disintegrating tablet Take 1 tablet (4 mg total) by mouth every 8 (eight) hours as needed for nausea or vomiting. 07/31/19   Wieters, Hallie C, PA-C  sucralfate (CARAFATE) 1 GM/10ML suspension Take 10 mLs (1 g total) by mouth 4 (four) times daily -  with meals and at bedtime. 07/31/19   Wieters, Hallie C, PA-C  triamcinolone cream (KENALOG) 0.1 % Apply 1 application topically 2 (two) times daily. 07/31/19   Wieters, Hallie C, PA-C  fluticasone (FLONASE) 50 MCG/ACT  nasal spray Place 1 spray into both nostrils daily. Patient not taking: Reported on 11/09/2018 07/01/18 04/15/19  Janne Napoleon, NP  potassium chloride SA (K-DUR) 20 MEQ tablet Take 1 tablet (20 mEq total) by mouth 2 (two) times daily for 2 days. Patient not taking: Reported on 07/03/2019 11/09/18 07/31/19  Alvira Monday, MD    Allergies    Patient has no known allergies.  Review of Systems   Review of Systems  Constitutional: Negative for fever.  Respiratory: Positive for chest tightness.        Palpitations  Gastrointestinal: Positive for abdominal pain.  Psychiatric/Behavioral:       Anxious, doesn't feel like anything has been right since she had covid  All other systems reviewed and are negative.   Physical Exam Updated Vital  Signs BP 108/64   Pulse 91   Temp 98.6 F (37 C) (Oral)   Resp 17   LMP 07/30/2019 Comment: Preg Test Waiver  SpO2 100%   Physical Exam Vitals and nursing note reviewed.  Constitutional:      Appearance: She is well-developed. She is not toxic-appearing or diaphoretic.  HENT:     Head: Normocephalic and atraumatic.     Right Ear: External ear normal.     Left Ear: External ear normal.  Eyes:     General: No scleral icterus.       Right eye: No discharge.        Left eye: No discharge.     Conjunctiva/sclera: Conjunctivae normal.  Neck:     Trachea: No tracheal deviation.  Cardiovascular:     Rate and Rhythm: Normal rate and regular rhythm.  Pulmonary:     Effort: Pulmonary effort is normal. No respiratory distress.     Breath sounds: Normal breath sounds. No stridor. No wheezing or rales.  Abdominal:     General: Bowel sounds are normal. There is no distension.     Palpations: Abdomen is soft.     Tenderness: There is no abdominal tenderness. There is no guarding or rebound.     Comments: Generalized ttp, no mass, external intermittent abdominal muscle spasms  Musculoskeletal:        General: No tenderness.     Cervical back: Neck supple.  Skin:    General: Skin is warm and dry.     Findings: No rash.  Neurological:     Mental Status: She is alert.     Cranial Nerves: No cranial nerve deficit (no facial droop, extraocular movements intact, no slurred speech).     Sensory: No sensory deficit.     Motor: No abnormal muscle tone or seizure activity.     Coordination: Coordination normal.  Psychiatric:     Comments: Anxious, tearful      ED Results / Procedures / Treatments   Labs (all labs ordered are listed, but only abnormal results are displayed) Labs Reviewed  COMPREHENSIVE METABOLIC PANEL - Abnormal; Notable for the following components:      Result Value   BUN 5 (*)    AST 13 (*)    Total Bilirubin 1.6 (*)    All other components within normal limits    URINALYSIS, ROUTINE W REFLEX MICROSCOPIC - Abnormal; Notable for the following components:   APPearance HAZY (*)    Ketones, ur 5 (*)    All other components within normal limits  LIPASE, BLOOD  CBC  I-STAT BETA HCG BLOOD, ED ([facility], [facility], [facility] ONLY)    EKG None  Radiology  CT ABDOMEN PELVIS W CONTRAST  Result Date: 08/20/2019 CLINICAL DATA:  39 year old female with concern for bowel obstruction. EXAM: CT ABDOMEN AND PELVIS WITH CONTRAST TECHNIQUE: Multidetector CT imaging of the abdomen and pelvis was performed using the standard protocol following bolus administration of intravenous contrast. CONTRAST:  OMNIPAQUE IOHEXOL 300 MG/ML  SOLN COMPARISON:  CT abdomen pelvis dated 04/09/2018. FINDINGS: Lower chest: The visualized lung bases are clear. No intra-abdominal free air or free fluid. Hepatobiliary: The liver is unremarkable. No intrahepatic biliary ductal dilatation. Cholecystectomy. Pancreas: Unremarkable. No pancreatic ductal dilatation or surrounding inflammatory changes. Spleen: Normal in size without focal abnormality. Adrenals/Urinary Tract: The adrenal glands are unremarkable. There is no hydronephrosis on either side. Subcentimeter right renal inferior pole hypodense focus is too small to characterize but may represent a cyst. The visualized ureters, and urinary bladder appear unremarkable. Stomach/Bowel: There is sigmoid diverticulosis. There is mild focal haziness adjacent to the sigmoid colon (coronal series 6 image 55) which may be chronic. Mild acute diverticulitis is not excluded. Clinical correlation is recommended. There is no bowel obstruction. The appendix is normal. Vascular/Lymphatic: The abdominal aorta and IVC are unremarkable. No portal venous gas. There is no adenopathy. Reproductive: The uterus is anteverted and grossly unremarkable. An intrauterine device is noted. No adnexal masses. Other: Small fat containing umbilical hernia.  No fluid collection. Musculoskeletal: No  acute or significant osseous findings. IMPRESSION: Sigmoid diverticulosis. Mild focal perisigmoid stranding may be chronic and scarring. Mild acute diverticulitis is not excluded. Clinical correlation is recommended. No bowel obstruction. Normal appendix. Electronically Signed   By: Elgie Collard M.D.   On: 08/20/2019 22:06    Procedures Procedures (including critical care time)  Medications Ordered in ED Medications  sodium chloride flush (NS) 0.9 % injection 3 mL (3 mLs Intravenous Not Given 08/20/19 2038)  ondansetron (ZOFRAN-ODT) disintegrating tablet 4 mg (4 mg Oral Given 08/20/19 2102)  LORazepam (ATIVAN) tablet 1 mg (1 mg Oral Given 08/20/19 2102)  iohexol (OMNIPAQUE) 300 MG/ML solution 100 mL (100 mLs Intravenous Contrast Given 08/20/19 2144)    ED Course  I have reviewed the triage vital signs and the nursing notes.  Pertinent labs & imaging results that were available during my care of the patient were reviewed by me and considered in my medical decision making (see chart for details).  Clinical Course as of Aug 20 2227  Wed Aug 20, 2019  2222 Pt continues to have intermittent grunting as if she is having abdominal contractions.  CT scan and labs reviewed.  ? Mild diverticulitis.  Her sx are not typical for diverticulitis presentation   [JK]    Clinical Course User Index [JK] Linwood Dibbles, MD   MDM Rules/Calculators/A&P                      Patient presented with atypical abdominal pain.  Patient was having intermittent intense spasms.  Patient would catch her breath similar to his if she was being punched.  Patient's ED work-up showed normal laboratory tests.  Her abdomen was soft and not distended.  CT scan was performed considering her persistent pain and discomfort.  Shows the possibility of a mild diverticulitis although her presentation is not consistent with that finding.  I will cover with a course of antibiotics.  We will try some muscle relaxant medications.  I will give  her a refill of her antacids.  There is possible there could be a stress component to her symptoms.  Recommend follow-up  with a primary care doctor. Final Clinical Impression(s) / ED Diagnoses Final diagnoses:  Spasm of abdominal muscles    Rx / DC Orders ED Discharge Orders         Ordered    amoxicillin-clavulanate (AUGMENTIN) 875-125 MG tablet  2 times daily     08/20/19 2229    cyclobenzaprine (FLEXERIL) 10 MG tablet  2 times daily PRN     08/20/19 2229    omeprazole (PRILOSEC) 20 MG capsule  2 times daily before meals     08/20/19 2229           Linwood Dibbles, MD 08/20/19 2231

## 2019-08-20 NOTE — ED Notes (Signed)
Pt went to c-t 

## 2019-08-20 NOTE — Discharge Instructions (Addendum)
The CT scan showed the possibility of a mild infection but I do not think that is the source of the abdominal muscle spasms.  Take the medications as prescribed.  Follow-up with your primary care doctor.  I also gave you a refill of your antacid medications

## 2019-08-20 NOTE — ED Triage Notes (Signed)
Pt c/o abd tightening, no pain, and burping a lot.  Pt states she ws just seen for same.

## 2019-08-25 ENCOUNTER — Emergency Department (HOSPITAL_COMMUNITY): Payer: BLUE CROSS/BLUE SHIELD

## 2019-08-25 ENCOUNTER — Other Ambulatory Visit: Payer: Self-pay

## 2019-08-25 ENCOUNTER — Emergency Department (HOSPITAL_COMMUNITY)
Admission: EM | Admit: 2019-08-25 | Discharge: 2019-08-25 | Disposition: A | Discharge status: Home / Self Care | Payer: BLUE CROSS/BLUE SHIELD | Attending: Emergency Medicine | Admitting: Emergency Medicine

## 2019-08-25 DIAGNOSIS — Z79899 Other long term (current) drug therapy: Secondary | ICD-10-CM | POA: Diagnosis not present

## 2019-08-25 DIAGNOSIS — M62838 Other muscle spasm: Secondary | ICD-10-CM | POA: Diagnosis not present

## 2019-08-25 DIAGNOSIS — R0789 Other chest pain: Secondary | ICD-10-CM | POA: Diagnosis not present

## 2019-08-25 DIAGNOSIS — J45909 Unspecified asthma, uncomplicated: Secondary | ICD-10-CM | POA: Insufficient documentation

## 2019-08-25 LAB — BASIC METABOLIC PANEL
Anion gap: 7 (ref 5–15)
BUN: <5 mg/dL — ABNORMAL LOW (ref 6–20)
CO2: 27 mmol/L (ref 22–32)
Calcium: 9.3 mg/dL (ref 8.9–10.3)
Chloride: 106 mmol/L (ref 98–111)
Creatinine, Ser: 0.89 mg/dL (ref 0.44–1.00)
GFR calc Af Amer: >60 mL/min (ref >60)
GFR calc non Af Amer: >60 mL/min (ref >60)
Glucose, Bld: 101 mg/dL — ABNORMAL HIGH (ref 70–99)
Potassium: 3.8 mmol/L (ref 3.5–5.1)
Sodium: 140 mmol/L (ref 135–145)

## 2019-08-25 LAB — CBC
HCT: 42.3 % (ref 36.0–46.0)
Hemoglobin: 13.3 g/dL (ref 12.0–15.0)
MCH: 27.8 pg (ref 26.0–34.0)
MCHC: 31.4 g/dL (ref 30.0–36.0)
MCV: 88.5 fL (ref 80.0–100.0)
Platelets: 244 10*3/uL (ref 150–400)
RBC: 4.78 MIL/uL (ref 3.87–5.11)
RDW: 13.1 % (ref 11.5–15.5)
WBC: 6.1 10*3/uL (ref 4.0–10.5)
nRBC: 0 % (ref 0.0–0.2)

## 2019-08-25 LAB — I-STAT BETA HCG BLOOD, ED (MC, WL, AP ONLY): I-stat hCG, quantitative: <5 m[IU]/mL (ref <5)

## 2019-08-25 LAB — TROPONIN I (HIGH SENSITIVITY): Troponin I (High Sensitivity): <2 ng/L (ref <18)

## 2019-08-25 MED ORDER — LORAZEPAM 1 MG PO TABS
1.0000 mg | ORAL_TABLET | Freq: Once | ORAL | Status: AC
  Administered 2019-08-25: 14:00:00 1 mg via ORAL
  Filled 2019-08-25: qty 1

## 2019-08-25 MED ORDER — SODIUM CHLORIDE 0.9% FLUSH: 3.0000 mL | Freq: Once | INTRAVENOUS | Status: DC

## 2019-08-25 NOTE — ED Triage Notes (Signed)
Pt here for evaluation of spasming chest pain since last night. Worse when lying down. Pt sts she has pressure behind bilateral eyes, feeling like twitching. Pt also having grunting spasms every few seconds where she sounds like she is going to vomit but she does not vomit.

## 2019-08-25 NOTE — ED Provider Notes (Signed)
[facility] EMERGENCY DEPARTMENT Provider Note   CSN: [id number] Arrival date & time: 08/25/19  1218     History Chief Complaint  Patient presents with  . Chest Pain  . Spasms    Amy Mendoza is a 39 y.o. female.  39 year old female with prior medical history as detailed below presents for evaluation of intermittent muscular jerking.  Patient reports that the symptoms been ongoing for several days.  She denies associated fever.  She denies weakness.  During my evaluation she has multiple episodes where her hands or feet jerk and twitch on the bed.  She appears to be distractible and these episodes are less frequent when she is busy answering questions.  The history is provided by the patient and medical records.  Illness Location:  "muscular twitch" Severity:  Moderate Onset quality:  Gradual Duration:  1 week Timing:  Constant Progression:  Waxing and waning Chronicity:  New      Past Medical History:  Diagnosis Date  . Asthma   . Gallstones   . Hyperlipemia     Patient Active Problem List   Diagnosis Date Noted  . Obesity, Class III, BMI 40-49.9 (morbid obesity) ([facility]) 07/07/2019  . Pneumonia due to COVID-19 virus 07/03/2019  . Acute respiratory failure with hypoxia ([facility]) 07/03/2019  . Mild intermittent asthma 07/03/2019  . Routine general medical examination at a health care facility 01/18/2017    Past Surgical History:  Procedure Laterality Date  . CHOLECYSTECTOMY  feb 2006  . KNEE SURGERY     left     OB History   No obstetric history on file.     Family History  Problem Relation Age of Onset  . Diabetes Father        MGM, maternal uncle  . Heart failure Father   . Prostate cancer Maternal Grandfather   . Breast cancer Maternal Aunt   . Healthy Mother   . Colon cancer Neg Hx     Social History   Tobacco Use  . Smoking status: Never Smoker  . Smokeless tobacco: Never Used  Substance Use Topics  . Alcohol use: Yes    Comment: occasionally  . Drug use: Yes    Types: Marijuana    Home Medications Prior to Admission medications   Medication Sig Start Date End Date Taking? Authorizing Provider  amoxicillin-clavulanate (AUGMENTIN) 875-125 MG tablet Take 1 tablet by mouth 2 (two) times daily. Patient taking differently: Take 1 tablet by mouth 2 (two) times daily. FOR 7 DAYS 08/20/19  Yes Linwood Dibbles, MD  Ascorbic Acid (VITAMIN C PO) Take 2 tablets by mouth daily.   Yes [provider]  cyclobenzaprine (FLEXERIL) 10 MG tablet Take 1 tablet (10 mg total) by mouth 2 (two) times daily as needed for muscle spasms. 08/20/19  Yes Linwood Dibbles, MD  hydrOXYzine (ATARAX/VISTARIL) 25 MG tablet Take 1 tablet (25 mg total) by mouth every 6 (six) hours as needed for anxiety. 07/31/19  Yes Wieters, Hallie C, PA-C  levonorgestrel (MIRENA) 20 MCG/24HR IUD 1 each by Intrauterine route once.   Yes [provider]  omeprazole (PRILOSEC) 20 MG capsule Take 1 capsule (20 mg total) by mouth 2 (two) times daily before a meal for 14 days. 08/20/19 09/03/19 Yes Linwood Dibbles, MD  ondansetron (ZOFRAN ODT) 4 MG disintegrating tablet Take 1 tablet (4 mg total) by mouth every 8 (eight) hours as needed for nausea or vomiting. Patient taking differently: Take 4 mg by mouth every 8 (eight)  hours as needed for nausea or vomiting (DISSOLVE IN THE MOUTH).  07/31/19  Yes Wieters, Hallie C, PA-C  triamcinolone cream (KENALOG) 0.1 % Apply 1 application topically 2 (two) times daily. 07/31/19  Yes Wieters, Hallie C, PA-C  sucralfate (CARAFATE) 1 GM/10ML suspension Take 10 mLs (1 g total) by mouth 4 (four) times daily -  with meals and at bedtime. Patient not taking: Reported on 08/25/2019 07/31/19   Wieters, Hallie C, PA-C  fluticasone (FLONASE) 50 MCG/ACT nasal spray Place 1 spray into both nostrils daily. Patient not taking: Reported on 11/09/2018 07/01/18 04/15/19  Ashley Murrain, NP  potassium chloride SA (K-DUR) 20 MEQ tablet Take 1 tablet (20 mEq  total) by mouth 2 (two) times daily for 2 days. Patient not taking: Reported on 07/03/2019 11/09/18 07/31/19  Gareth Morgan, MD    Allergies    Patient has no known allergies.  Review of Systems   Review of Systems  All other systems reviewed and are negative.   Physical Exam Updated Vital Signs BP 112/66 (BP Location: Right Arm)   Pulse (!) 108   Temp 98.6 F (37 C) (Oral)   Resp 16   LMP 07/30/2019 Comment: Preg Test Waiver  SpO2 99%   Physical Exam Vitals and nursing note reviewed.  Constitutional:      General: She is not in acute distress.    Appearance: She is well-developed.  HENT:     Head: Normocephalic and atraumatic.  Eyes:     Conjunctiva/sclera: Conjunctivae normal.     Pupils: Pupils are equal, round, and reactive to light.  Cardiovascular:     Rate and Rhythm: Normal rate and regular rhythm.     Heart sounds: Normal heart sounds.  Pulmonary:     Effort: Pulmonary effort is normal. No respiratory distress.     Breath sounds: Normal breath sounds.  Abdominal:     General: There is no distension.     Palpations: Abdomen is soft.     Tenderness: There is no abdominal tenderness.  Musculoskeletal:        General: No deformity. Normal range of motion.     Cervical back: Normal range of motion and neck supple.  Skin:    General: Skin is warm and dry.  Neurological:     Mental Status: She is alert and oriented to person, place, and time.     ED Results / Procedures / Treatments   Labs (all labs ordered are listed, but only abnormal results are displayed) Labs Reviewed  BASIC METABOLIC PANEL - Abnormal; Notable for the following components:      Result Value   Glucose, Bld 101 (*)    BUN <5 (*)    All other components within normal limits  CBC  I-STAT BETA HCG BLOOD, ED ([facility], [facility], [facility] ONLY)  TROPONIN I (HIGH SENSITIVITY)  TROPONIN I (HIGH SENSITIVITY)    EKG None  Radiology DG Chest 2 View  Result Date: 08/25/2019 CLINICAL DATA:  Chest pain.  EXAM: CHEST - 2 VIEW COMPARISON:  08/10/2019 FINDINGS: The heart size and mediastinal contours are within normal limits. Both lungs are clear. The visualized skeletal structures are unremarkable. IMPRESSION: Normal exam. Electronically Signed   By: Lorriane Shire M.D.   On: 08/25/2019 12:52    Procedures Procedures (including critical care time)  Medications Ordered in ED Medications  sodium chloride flush (NS) 0.9 % injection 3 mL (has no administration in time range)  LORazepam (ATIVAN) tablet 1 mg (1 mg Oral  Given 08/25/19 1410)    ED Course  I have reviewed the triage vital signs and the nursing notes.  Pertinent labs & imaging results that were available during my care of the patient were reviewed by me and considered in my medical decision making (see chart for details).    MDM Rules/Calculators/A&P                      MDM  Screen complete  Amy Mendoza was evaluated in Emergency Department on 08/25/2019 for the symptoms described in the history of present illness. She was evaluated in the context of the global COVID-19 pandemic, which necessitated consideration that the patient might be at risk for infection with the SARS-CoV-2 virus that causes COVID-19. Institutional protocols and algorithms that pertain to the evaluation of patients at risk for COVID-19 are in a state of rapid change based on information released by regulatory bodies including the CDC and federal and state organizations. These policies and algorithms were followed during the patient's care in the ED.  Patient is presenting for evaluation of reported muscular twitching.  Exam and work-up do not demonstrate evidence of significant acute pathology.  Patient does feel improved following administration of p.o. Ativan.  Suspect that the patient has an element of anxiety related to her symptoms.  Patient does feel improved following her ED evaluation and treatment.  She now desires discharge home.  Importance of  close follow-up is stressed.   Final Clinical Impression(s) / ED Diagnoses Final diagnoses:  Muscle spasm    Rx / DC Orders ED Discharge Orders    None       Wynetta Fines, MD 08/25/19 [phone]

## 2019-08-25 NOTE — Discharge Instructions (Addendum)
Please return for any problem.  Follow-up with your regular care provider as instructed. °

## 2019-09-09 ENCOUNTER — Ambulatory Visit (INDEPENDENT_AMBULATORY_CARE_PROVIDER_SITE_OTHER)
Admission: RE | Admit: 2019-09-09 | Discharge: 2019-09-09 | Disposition: A | Discharge status: Home / Self Care | Payer: BLUE CROSS/BLUE SHIELD | Source: Ambulatory Visit

## 2019-09-09 DIAGNOSIS — F411 Generalized anxiety disorder: Secondary | ICD-10-CM | POA: Diagnosis not present

## 2019-09-09 DIAGNOSIS — R110 Nausea: Secondary | ICD-10-CM | POA: Diagnosis not present

## 2019-09-09 DIAGNOSIS — Z760 Encounter for issue of repeat prescription: Secondary | ICD-10-CM

## 2019-09-09 MED ORDER — ONDANSETRON HCL 4 MG PO TABS: 4.0000 mg | ORAL_TABLET | Freq: Four times a day (QID) | ORAL | 0 refills | Status: DC | PRN

## 2019-09-09 MED ORDER — HYDROXYZINE HCL 25 MG PO TABS: 25.0000 mg | ORAL_TABLET | Freq: Four times a day (QID) | ORAL | 0 refills | Status: DC | PRN

## 2019-09-09 NOTE — ED Provider Notes (Signed)
Virtual Visit via Video Note:  Amy Mendoza  initiated request for Telemedicine visit with [facility] team. I connected with Amy Mendoza  on 09/09/2019 at 1:15 PM  for a synchronized telemedicine visit using a video enabled HIPPA compliant telemedicine application. I verified that I am speaking with Amy Mendoza  using two identifiers. Mickie Bail, NP  was physically located in a [facility] site and Amy Mendoza was located at a different location.   The limitations of evaluation and management by telemedicine as well as the availability of in-person appointments were discussed. Patient was informed that she  may incur a bill ( including co-pay) for this virtual visit encounter. Amy Mendoza  expressed understanding and gave verbal consent to proceed with virtual visit.     History of Present Illness:Amy Mendoza  is a 39 y.o. female presents with request for refill of medications.  She reports ongoing anxiety, nausea, and "dry heaving" since having COVID in December.  She has been seen multiple times in the Urgent Care and ED for symptoms since December.  She states she is out of Zofran and hydroxyzine; she requests a refill for these medications.  She states she does not currently have a PCP but has an appointment to establish a PCP on 09/18/2019.  She denies fever, chills, vomiting, or other symptoms.     No Known Allergies   Past Medical History:  Diagnosis Date  . Asthma   . Gallstones   . Hyperlipemia      Social History   Tobacco Use  . Smoking status: Never Smoker  . Smokeless tobacco: Never Used  Substance Use Topics  . Alcohol use: Yes    Comment: occasionally  . Drug use: Yes    Types: Marijuana    ROS: as stated in HPI.  All other systems reviewed and negative.       Observations/Objective: Physical Exam  VITALS: Patient denies fever. GENERAL: Alert, appears well and in no acute distress. HEENT:  Atraumatic. NECK: Normal movements of the head and neck. CARDIOPULMONARY: No increased WOB. Speaking in clear sentences. I:E ratio WNL.  MS: Moves all visible extremities without noticeable abnormality. PSYCH: Pleasant and cooperative, well-groomed. Speech normal rate and rhythm. Affect is appropriate. Insight and judgement are appropriate. Attention is focused, linear, and appropriate.  NEURO: CN grossly intact. Oriented as arrived to appointment on time with no prompting. Moves both UE equally.  SKIN: No obvious lesions, wounds, erythema, or cyanosis noted on face or hands.   Assessment and Plan:    ICD-10-CM   1. Encounter for medication refill  Z76.0   2. Anxiety state  F41.1   3. Nausea without vomiting  R11.0        Follow Up Instructions: Refill of hydroxyzine and Zofran provided until patient can establish her new PCP on 09/18/2019.  Instructed patient to follow up with her new PCP to discuss ongoing treatment for her symptoms.  Patient agrees to plan of care.    I discussed the assessment and treatment plan with the patient. The patient was provided an opportunity to ask questions and all were answered. The patient agreed with the plan and demonstrated an understanding of the instructions.   The patient was advised to call back or seek an in-person evaluation if the symptoms worsen or if the condition fails to improve as anticipated.      Mickie Bail, NP  09/09/2019 1:15 PM  Sharion Balloon, NP 09/09/19 1315

## 2019-09-09 NOTE — Discharge Instructions (Addendum)
Take the hydroxyzine and ondansetron (Zofran) as directed.    Follow up with your new primary care provider as scheduled on 09/18/2019.

## 2019-09-18 ENCOUNTER — Encounter: Payer: Self-pay | Admitting: Internal Medicine

## 2019-09-18 ENCOUNTER — Ambulatory Visit: Payer: BLUE CROSS/BLUE SHIELD | Attending: Internal Medicine | Admitting: Internal Medicine

## 2019-09-18 ENCOUNTER — Other Ambulatory Visit: Payer: Self-pay

## 2019-09-18 VITALS — BP 129/80 | HR 89 | Temp 97.5°F | Resp 16 | Ht 69.0 in | Wt 269.4 lb

## 2019-09-18 DIAGNOSIS — Z79899 Other long term (current) drug therapy: Secondary | ICD-10-CM | POA: Insufficient documentation

## 2019-09-18 DIAGNOSIS — Z793 Long term (current) use of hormonal contraceptives: Secondary | ICD-10-CM | POA: Insufficient documentation

## 2019-09-18 DIAGNOSIS — K219 Gastro-esophageal reflux disease without esophagitis: Secondary | ICD-10-CM | POA: Diagnosis not present

## 2019-09-18 DIAGNOSIS — R1110 Vomiting, unspecified: Secondary | ICD-10-CM

## 2019-09-18 DIAGNOSIS — R079 Chest pain, unspecified: Secondary | ICD-10-CM

## 2019-09-18 DIAGNOSIS — F959 Tic disorder, unspecified: Secondary | ICD-10-CM | POA: Diagnosis not present

## 2019-09-18 DIAGNOSIS — Z8616 Personal history of COVID-19: Secondary | ICD-10-CM

## 2019-09-18 DIAGNOSIS — F411 Generalized anxiety disorder: Secondary | ICD-10-CM | POA: Diagnosis not present

## 2019-09-18 DIAGNOSIS — L853 Xerosis cutis: Secondary | ICD-10-CM

## 2019-09-18 DIAGNOSIS — Z6839 Body mass index (BMI) 39.0-39.9, adult: Secondary | ICD-10-CM | POA: Insufficient documentation

## 2019-09-18 DIAGNOSIS — J4520 Mild intermittent asthma, uncomplicated: Secondary | ICD-10-CM | POA: Insufficient documentation

## 2019-09-18 MED ORDER — ONDANSETRON HCL 4 MG PO TABS: 4.0000 mg | ORAL_TABLET | Freq: Every day | ORAL | 1 refills | Status: DC | PRN

## 2019-09-18 MED ORDER — TRIAMCINOLONE ACETONIDE 0.1 % EX CREA: 1.0000 "application " | TOPICAL_CREAM | Freq: Two times a day (BID) | CUTANEOUS | 0 refills | Status: DC | PRN

## 2019-09-18 MED ORDER — HYDROXYZINE HCL 25 MG PO TABS: 25.0000 mg | ORAL_TABLET | Freq: Four times a day (QID) | ORAL | 1 refills | Status: DC | PRN

## 2019-09-18 MED ORDER — SERTRALINE HCL 50 MG PO TABS: ORAL_TABLET | ORAL | 3 refills | Status: DC

## 2019-09-18 NOTE — Progress Notes (Signed)
Patient ID: Amy Mendoza, female    DOB: Feb 04, 1981  MRN: [id number]  CC: Hospitalization Follow-up (ED)   Subjective: Amy Mendoza is a 39 y.o. female who presents for new pt visit and f/u from ER Her concerns today include:  Patient with history of COVID-19 infection 06/2019, asthma, HL, anxiety, obesity  No previous PCP She presents today to establish care.  Patient was diagnosed with COVID-19 infection and had short hospital stay because of it 06/2019.  Since having Covid, she has developed symptoms including anxiety, heartburn with persistent dry heaves, intermittent twitching/tics especially of the upper body and chest pains.  She has had numerous ER visits since January of this year with these complaints.  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  Anxiety: Seen in the emergency room several times with chest pains and anxiety.  TSH normal.  She was started on hydroxyzine.   Hydroxyzine helps but has to take every 6 hrs.  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    Developed intermittent body twitching especially upper body.  This started in early January.  The twitching "comes in waves."  Some days no twitching at all.   She stopped drinking coffee several months ago but even then she was only drinking 1 cup a day  Still having burning in chest like heartburn.  She has been tried with various medications including omeprazole, Pepcid and Carafate.  She is currently taking just the Pepcid.  She still gets dry heaves for which she takes Zofran 4 times a day.  On one of her ER visits for abdominal pain, she had CAT scan of the abdomen that revealed sigmoid diverticulosis with mild focal perisigmoid stranding may be chronic and scarring.  Saw cardiologist chest pains 1 wk ago, Dr. Jillyn Hidden, in [facility]. Given monitor to wear for 10 days which she will turn in 09/25/2019. Has stress test and echo on 10/10/2019.    Had a  bad rash from COVID - peeling on face and ears.  The rash on the face and ears has resolved.  Itching has been dry itchy skin on her back and shoulders for which she had used triamcinolone cream.  She is requesting a refill on the cream.    Past medical, social, family history reviewed Patient Active Problem List   Diagnosis Date Noted  . Obesity, Class III, BMI 40-49.9 (morbid obesity) (HCC) 07/07/2019  . Pneumonia due to COVID-19 virus 07/03/2019  . Acute respiratory failure with hypoxia (HCC) 07/03/2019  . Mild intermittent asthma 07/03/2019  . Routine general medical examination at a health care facility 01/18/2017     Current Outpatient Medications on File Prior to Visit  Medication Sig Dispense Refill  . amoxicillin-clavulanate (AUGMENTIN) 875-125 MG tablet Take 1 tablet by mouth 2 (two) times daily. (Patient not taking: Reported on 09/18/2019) 14 tablet 0  . Ascorbic Acid (VITAMIN C PO) Take 2 tablets by mouth daily.    . cyclobenzaprine (FLEXERIL) 10 MG tablet Take 1 tablet (10 mg total) by mouth 2 (two) times daily as needed for muscle spasms. (Patient not taking: Reported on 09/18/2019) 20 tablet 0  . hydrOXYzine (ATARAX/VISTARIL) 25 MG tablet Take 1 tablet (25 mg total) by mouth every 6 (six) hours as needed for anxiety. 24 tablet 0  . levonorgestrel (MIRENA) 20 MCG/24HR IUD 1 each by Intrauterine route once.    Marland Kitchen omeprazole (PRILOSEC) 20 MG capsule  Take 1 capsule (20 mg total) by mouth 2 (two) times daily before a meal for 14 days. 28 capsule 0  . ondansetron (ZOFRAN) 4 MG tablet Take 1 tablet (4 mg total) by mouth every 6 (six) hours as needed for nausea or vomiting. 24 tablet 0  . sucralfate (CARAFATE) 1 GM/10ML suspension Take 10 mLs (1 g total) by mouth 4 (four) times daily -  with meals and at bedtime. (Patient not taking: Reported on 08/25/2019) 420 mL 0  . triamcinolone cream (KENALOG) 0.1 % Apply 1 application topically 2 (two) times daily. 80 g 0  . [DISCONTINUED] fluticasone  (FLONASE) 50 MCG/ACT nasal spray Place 1 spray into both nostrils daily. (Patient not taking: Reported on 11/09/2018) 16 g 2  . [DISCONTINUED] potassium chloride SA (K-DUR) 20 MEQ tablet Take 1 tablet (20 mEq total) by mouth 2 (two) times daily for 2 days. (Patient not taking: Reported on 07/03/2019) 4 tablet 0   No current facility-administered medications on file prior to visit.    No Known Allergies  Social History   Socioeconomic History  . Marital status: Single    Spouse name: Not on file  . Number of children: 1  . Years of education: Not on file  . Highest education level: Not on file  Occupational History  . Occupation: Conservation officer, nature  Tobacco Use  . Smoking status: Never Smoker  . Smokeless tobacco: Never Used  Substance and Sexual Activity  . Alcohol use: Yes    Comment: occasionally  . Drug use: Yes    Types: Marijuana  . Sexual activity: Yes    Birth control/protection: I.U.D.  Other Topics Concern  . Not on file  Social History Narrative  . Not on file   Social Determinants of Health   Financial Resource Strain:   . Difficulty of Paying Living Expenses: Not on file  Food Insecurity:   . Worried About Programme researcher, broadcasting/film/video in the Last Year: Not on file  . Ran Out of Food in the Last Year: Not on file  Transportation Needs:   . Lack of Transportation (Medical): Not on file  . Lack of Transportation (Non-Medical): Not on file  Physical Activity:   . Days of Exercise per Week: Not on file  . Minutes of Exercise per Session: Not on file  Stress:   . Feeling of Stress : Not on file  Social Connections:   . Frequency of Communication with Friends and Family: Not on file  . Frequency of Social Gatherings with Friends and Family: Not on file  . Attends Religious Services: Not on file  . Active Member of Clubs or Organizations: Not on file  . Attends Banker Meetings: Not on file  . Marital Status: Not on file  Intimate Partner Violence:   . Fear of  Current or Ex-Partner: Not on file  . Emotionally Abused: Not on file  . Physically Abused: Not on file  . Sexually Abused: Not on file    Family History  Problem Relation Age of Onset  . Diabetes Father        MGM, maternal uncle  . Heart failure Father   . Prostate cancer Maternal Grandfather   . Breast cancer Maternal Aunt   . Healthy Mother   . Colon cancer Neg Hx     Past Surgical History:  Procedure Laterality Date  . CHOLECYSTECTOMY  feb 2006  . KNEE SURGERY     left    ROS: Review of Systems  Negative except as stated above  PHYSICAL EXAM: BP 129/80   Pulse 89   Temp (!) 97.5 F (36.4 C)   Resp 16   Ht 5\' 9"  (1.753 m)   Wt 269 lb 6.4 oz (122.2 kg)   SpO2 98%   BMI 39.78 kg/m   Physical Exam  General appearance - alert, well appearing, obese young to middle-aged African-American female and in no distress Mental status - normal mood, behavior, speech, dress, motor activity, and thought processes Neck - supple, no significant adenopathy Chest - clear to auscultation, no wheezes, rales or rhonchi, symmetric air entry Heart - normal rate, regular rhythm, normal S1, S2, no murmurs, rubs, clicks or gallops Abdomen - soft, nontender, nondistended, no masses or organomegaly Neurological -she has intermittent tics of the upper body.  Power in in both the upper and lower extremities 5/5 bilaterally.  Gross sensation intact. Extremities -no lower extremity edema. Skin: Dry excoriated skin on the upper back especially over the shoulders. CMP Latest Ref Rng & Units 08/25/2019 08/20/2019 08/12/2019  Glucose 70 - 99 mg/dL 08/14/2019) 94 761(P)  BUN 6 - 20 mg/dL 509(T) 5(L) <2(I)  Creatinine 0.44 - 1.00 mg/dL <7(T 2.45 8.09  Sodium 135 - 145 mmol/L 140 139 141  Potassium 3.5 - 5.1 mmol/L 3.8 3.7 3.4(L)  Chloride 98 - 111 mmol/L 106 105 107  CO2 22 - 32 mmol/L 27 25 26   Calcium 8.9 - 10.3 mg/dL 9.3 9.2 9.1  Total Protein 6.5 - 8.1 g/dL - 7.1 -  Total Bilirubin 0.3 - 1.2 mg/dL -  1.6(H) -  Alkaline Phos 38 - 126 U/L - 55 -  AST 15 - 41 U/L - 13(L) -  ALT 0 - 44 U/L - 18 -   Lipid Panel     Component Value Date/Time   CHOL 169 02/07/2016 1719   TRIG 51 07/03/2019 0127   HDL 40 (L) 02/07/2016 1719   CHOLHDL 4.2 02/07/2016 1719   VLDL 33 (H) 02/07/2016 1719   LDLCALC 96 02/07/2016 1719    CBC    Component Value Date/Time   WBC 6.1 08/25/2019 1229   RBC 4.78 08/25/2019 1229   HGB 13.3 08/25/2019 1229   HCT 42.3 08/25/2019 1229   PLT 244 08/25/2019 1229   MCV 88.5 08/25/2019 1229   MCH 27.8 08/25/2019 1229   MCHC 31.4 08/25/2019 1229   RDW 13.1 08/25/2019 1229   LYMPHSABS 1.0 07/04/2019 0610   MONOABS 0.3 07/04/2019 0610   EOSABS 0.0 07/04/2019 0610   BASOSABS 0.0 07/04/2019 0610    ASSESSMENT AND PLAN: 1. Generalized anxiety disorder We discussed putting her on an SSRI like Zoloft to take daily and then using the hydroxyzine as needed.  Patient is agreeable to this.  We will start her on Zoloft 50 mg.  Patient advised to take half a tablet daily for the first 2 weeks and then after that a full tablet. - hydrOXYzine (ATARAX/VISTARIL) 25 MG tablet; Take 1 tablet (25 mg total) by mouth every 6 (six) hours as needed for anxiety.  Dispense: 60 tablet; Refill: 1 - sertraline (ZOLOFT) 50 MG tablet; Take 1/2 tablet PO daily for the first 2 weeks then after that take 1 tablet daily.  Dispense: 30 tablet; Refill: 3  2. Tic disorder Started after being diagnosed with Covid.  I am not sure of etiology.  We will check some electrolytes and refer her to neurology -I do recommend cutting back on the frequency of Zofran in case this  may be playing a role - Ambulatory referral to Neurology - Basic Metabolic Panel  3. Dry skin dermatitis Recommend purchasing Curel lotion over-the-counter and using it after drying off from a bath I have refilled the triamcinolone cream to use as needed  4. GERD without esophagitis On Pepcid.  5. Dry heaves I recommend cutting  back on the frequency of Zofran.  If symptoms persist will refer to GI - ondansetron (ZOFRAN) 4 MG tablet; Take 1 tablet (4 mg total) by mouth daily as needed for nausea or vomiting.  Dispense: 30 tablet; Refill: 1  6. Chest pain in adult Being worked up by cardiology  7. Personal history of covid-19 -Patient wanted to know whether I would advise that she get the COVID-19 vaccine.  She is afraid to given that she developed a lot of the symptoms that she is having after being diagnosed with Covid.  I told her that I understood her concern.  It is believed that immunity for Covid begins to wean 3 months out from having the infection so she may want to consider getting the Covid vaccine.  However I cannot guarantee that she would not have any adverse side effects from the vaccine  Patient was given the opportunity to ask questions.  Patient verbalized understanding of the plan and was able to repeat key elements of the plan.   No orders of the defined types were placed in this encounter.    Requested Prescriptions   Pending Prescriptions Disp Refills  . triamcinolone cream (KENALOG) 0.1 % 80 g 0    Sig: Apply 1 application topically 2 (two) times daily.  . ondansetron (ZOFRAN) 4 MG tablet 24 tablet 0    Sig: Take 1 tablet (4 mg total) by mouth every 6 (six) hours as needed for nausea or vomiting.  . hydrOXYzine (ATARAX/VISTARIL) 25 MG tablet 24 tablet 0    Sig: Take 1 tablet (25 mg total) by mouth every 6 (six) hours as needed for anxiety.    No follow-ups on file.  Karle Plumber, MD, FACP

## 2019-09-18 NOTE — Patient Instructions (Signed)
Purchase and use Curel lotion every night after getting out of the shower.  Start Zoloft as discussed.  I have referred you to a neurologist.  Cut back on use of Zofran.

## 2019-09-18 NOTE — Progress Notes (Signed)
Pt states she has been having a lot of dry heaving  Pt states she has been having a lot of twitching going on

## 2019-09-19 LAB — BASIC METABOLIC PANEL
BUN/Creatinine Ratio: 7 — ABNORMAL LOW (ref 9–23)
BUN: 7 mg/dL (ref 6–20)
CO2: 23 mmol/L (ref 20–29)
Calcium: 9.6 mg/dL (ref 8.7–10.2)
Chloride: 104 mmol/L (ref 96–106)
Creatinine, Ser: 0.94 mg/dL (ref 0.57–1.00)
GFR calc Af Amer: 90 mL/min/{1.73_m2} (ref >59)
GFR calc non Af Amer: 78 mL/min/{1.73_m2} (ref >59)
Glucose: 79 mg/dL (ref 65–99)
Potassium: 4 mmol/L (ref 3.5–5.2)
Sodium: 142 mmol/L (ref 134–144)

## 2019-09-21 ENCOUNTER — Emergency Department (HOSPITAL_COMMUNITY)
Admission: EM | Admit: 2019-09-21 | Discharge: 2019-09-21 | Disposition: A | Discharge status: Home / Self Care | Payer: BLUE CROSS/BLUE SHIELD | Attending: Emergency Medicine | Admitting: Emergency Medicine

## 2019-09-21 ENCOUNTER — Encounter (HOSPITAL_COMMUNITY): Payer: Self-pay | Admitting: Emergency Medicine

## 2019-09-21 ENCOUNTER — Other Ambulatory Visit: Payer: Self-pay

## 2019-09-21 DIAGNOSIS — Z79899 Other long term (current) drug therapy: Secondary | ICD-10-CM | POA: Insufficient documentation

## 2019-09-21 DIAGNOSIS — R110 Nausea: Secondary | ICD-10-CM | POA: Diagnosis present

## 2019-09-21 DIAGNOSIS — J45909 Unspecified asthma, uncomplicated: Secondary | ICD-10-CM | POA: Insufficient documentation

## 2019-09-21 DIAGNOSIS — F419 Anxiety disorder, unspecified: Secondary | ICD-10-CM | POA: Diagnosis not present

## 2019-09-21 NOTE — ED Notes (Signed)
Pt verbalizes understanding of d/c instructions. Pt ambulatory at d/c with all belongings.   

## 2019-09-21 NOTE — Discharge Instructions (Signed)
The Zoloft you are taking should help but it will probably take several weeks to start working.  Your doctor also may need to increase the dose, if there is no improvement after a few weeks.  Some other things that can help your anxiety, include staying busy, working, and finding a counselor to talk things through, with.  Use the resource guide which is attached, to help you find a counselor.

## 2019-09-21 NOTE — ED Triage Notes (Addendum)
Pt presents with "dry heaves" that she reports have been ongoing for a month after having covid (patient having uncontrollable grunting sounds that appears like she may vomit but she does not vomit). She states she has been here multiple times for the same without any resolution. She states she never vomits and sometimes feels nauseated, has been taking zofran around the clock and depression meds were recently changed. She states her pcp has referred her to a neurologist. Currently has heart monitor on, placed by cards as part of her work up for these symptoms. A/ox4, speech clear, no neuro deficits. Denies cp or sob.

## 2019-09-21 NOTE — ED Provider Notes (Signed)
[facility] EMERGENCY DEPARTMENT Provider Note   CSN: [id number] Arrival date & time: 09/21/19  1105     History Chief Complaint  Patient presents with  . dry heaves    Amy Mendoza is a 39 y.o. female.  HPI She is here for evaluation of symptoms which she agrees her anxiety, and manifest by "heaving."  The symptoms tend to come and go and are not noticeable when she is working.  She is also bothered by "tics," which she did not describe specifically to me.  Her PCP has been managing her and several days ago prescribed Zoloft, patient has only taken 1 dose, yesterday.  She has been using hydroxyzine for several weeks, to help with anxiety symptoms.  She has been referred to cardiology, and has planned echocardiograms later this month and has a Zio patch on, currently.  Patient denies fever, chills, cough, shortness of breath, nausea, vomiting, weakness or dizziness.  She had Covid a couple months ago.  Her anxiety started after the Covid infection.  There are no other known modifying factors.    Past Medical History:  Diagnosis Date  . Asthma   . Gallstones   . Hyperlipemia     Patient Active Problem List   Diagnosis Date Noted  . Generalized anxiety disorder 09/18/2019  . Tic disorder 09/18/2019  . Dry skin dermatitis 09/18/2019  . Dry heaves 09/18/2019  . Personal history of covid-19 09/18/2019  . Obesity, Class III, BMI 40-49.9 (morbid obesity) (HCC) 07/07/2019  . Pneumonia due to COVID-19 virus 07/03/2019  . Acute respiratory failure with hypoxia (HCC) 07/03/2019  . Mild intermittent asthma 07/03/2019  . Routine general medical examination at a health care facility 01/18/2017    Past Surgical History:  Procedure Laterality Date  . CHOLECYSTECTOMY  feb 2006  . KNEE SURGERY     left     OB History   No obstetric history on file.     Family History  Problem Relation Age of Onset  . Diabetes Father        MGM, maternal uncle  . Heart  failure Father   . Prostate cancer Maternal Grandfather   . Breast cancer Maternal Aunt   . Healthy Mother   . Colon cancer Neg Hx     Social History   Tobacco Use  . Smoking status: Never Smoker  . Smokeless tobacco: Never Used  Substance Use Topics  . Alcohol use: Yes    Comment: occasionally  . Drug use: Yes    Types: Marijuana    Home Medications Prior to Admission medications   Medication Sig Start Date End Date Taking? Authorizing Provider  Ascorbic Acid (VITAMIN C PO) Take 2 tablets by mouth daily.    [provider]  hydrOXYzine (ATARAX/VISTARIL) 25 MG tablet Take 1 tablet (25 mg total) by mouth every 6 (six) hours as needed for anxiety. 09/18/19   Marcine Matar, MD  levonorgestrel (MIRENA) 20 MCG/24HR IUD 1 each by Intrauterine route once.    [provider]  omeprazole (PRILOSEC) 20 MG capsule Take 1 capsule (20 mg total) by mouth 2 (two) times daily before a meal for 14 days. 08/20/19 09/03/19  Linwood Dibbles, MD  ondansetron (ZOFRAN) 4 MG tablet Take 1 tablet (4 mg total) by mouth daily as needed for nausea or vomiting. 09/18/19   Marcine Matar, MD  sertraline (ZOLOFT) 50 MG tablet Take 1/2 tablet PO daily for the first 2 weeks then after that take  1 tablet daily. 09/18/19   Marcine Matar, MD  triamcinolone cream (KENALOG) 0.1 % Apply 1 application topically 2 (two) times daily as needed. 09/18/19   Marcine Matar, MD  fluticasone (FLONASE) 50 MCG/ACT nasal spray Place 1 spray into both nostrils daily. Patient not taking: Reported on 11/09/2018 07/01/18 04/15/19  Janne Napoleon, NP  potassium chloride SA (K-DUR) 20 MEQ tablet Take 1 tablet (20 mEq total) by mouth 2 (two) times daily for 2 days. Patient not taking: Reported on 07/03/2019 11/09/18 07/31/19  Alvira Monday, MD    Allergies    Patient has no known allergies.  Review of Systems   Review of Systems  All other systems reviewed and are negative.   Physical Exam Updated Vital  Signs BP (!) 122/96   Pulse (!) 104   Temp 98.3 F (36.8 C) (Oral)   Resp 13   SpO2 100%   Physical Exam Vitals and nursing note reviewed.  Constitutional:      General: She is not in acute distress.    Appearance: She is well-developed. She is obese. She is not ill-appearing, toxic-appearing or diaphoretic.  HENT:     Head: Normocephalic and atraumatic.     Right Ear: External ear normal.     Left Ear: External ear normal.  Eyes:     Conjunctiva/sclera: Conjunctivae normal.     Pupils: Pupils are equal, round, and reactive to light.  Neck:     Trachea: Phonation normal.  Cardiovascular:     Rate and Rhythm: Normal rate.  Pulmonary:     Effort: Pulmonary effort is normal. No respiratory distress.     Breath sounds: No stridor.  Abdominal:     General: There is no distension.     Palpations: Abdomen is soft.     Tenderness: There is no abdominal tenderness.  Musculoskeletal:        General: Normal range of motion.     Cervical back: Normal range of motion and neck supple.  Skin:    General: Skin is warm and dry.  Neurological:     Mental Status: She is alert and oriented to person, place, and time.     Cranial Nerves: No cranial nerve deficit.     Sensory: No sensory deficit.     Motor: No abnormal muscle tone.     Coordination: Coordination normal.  Psychiatric:        Behavior: Behavior normal.        Thought Content: Thought content normal.        Judgment: Judgment normal.     Comments: Mildly anxious.  Intermittent symptom of upper body movement with rapid cough and exhalation, periodic, which does not impair talking or completing sentences.     ED Results / Procedures / Treatments   Labs (all labs ordered are listed, but only abnormal results are displayed) Labs Reviewed - No data to display  EKG None  Radiology No results found.  Procedures Procedures (including critical care time)  Medications Ordered in ED Medications - No data to display  ED  Course  I have reviewed the triage vital signs and the nursing notes.  Pertinent labs & imaging results that were available during my care of the patient were reviewed by me and considered in my medical decision making (see chart for details).    MDM Rules/Calculators/A&P                       Patient  Vitals for the past 24 hrs:  BP Temp Temp src Pulse Resp SpO2  09/21/19 1122 (!) 122/96 98.3 F (36.8 C) Oral (!) 104 13 100 %    12:41 PM Reevaluation with update and discussion. After initial assessment and treatment, an updated evaluation reveals no change in status, findings discussed and questions answered. Daleen Bo   Medical Decision Making: Anxiety, currently being initiated on Zoloft treatment.  She has been on hydroxyzine for couple weeks.  She is not currently seeing a counselor and will likely benefit from that.  She describes being able to work, and having less symptoms when she is otherwise distracted.  She is seeing cardiology with management for possible cardiac disorders, upcoming.  Doubt acute unstable psychiatric condition, acute cardiac disorder, metabolic instability or impending vascular collapse.  No indication for ED evaluation beyond clinical assessment, and she is stable for discharge.  They do not think that this represents a persistent direct Covid problem.  Amy Mendoza was evaluated in Emergency Department on 09/21/2019 for the symptoms described in the history of present illness. She was evaluated in the context of the global COVID-19 pandemic, which necessitated consideration that the patient might be at risk for infection with the SARS-CoV-2 virus that causes COVID-19. Institutional protocols and algorithms that pertain to the evaluation of patients at risk for COVID-19 are in a state of rapid change based on information released by regulatory bodies including the CDC and federal and state organizations. These policies and algorithms were followed during the  patient's care in the ED.   CRITICAL CARE-no Performed by: Daleen Bo  Nursing Notes Reviewed/ Care Coordinated Applicable Imaging Reviewed Interpretation of Laboratory Data incorporated into ED treatment  The patient appears reasonably screened and/or stabilized for discharge and I doubt any other medical condition or other [facility] requiring further screening, evaluation, or treatment in the ED at this time prior to discharge.  Plan: Home Medications-continue current; Home Treatments-concentrate on working and staying busy; return here if the recommended treatment, does not improve the symptoms; Recommended follow up-consider following up with a counselor for stress management, anxiety treatment and follow-up with PCP for ongoing management of medication needs.   Final Clinical Impression(s) / ED Diagnoses Final diagnoses:  Anxiety    Rx / DC Orders ED Discharge Orders    None       Daleen Bo, MD 09/21/19 1243

## 2019-09-22 ENCOUNTER — Emergency Department (HOSPITAL_COMMUNITY)
Admission: EM | Admit: 2019-09-22 | Discharge: 2019-09-23 | Disposition: A | Discharge status: Home / Self Care | Payer: BLUE CROSS/BLUE SHIELD | Attending: Emergency Medicine | Admitting: Emergency Medicine

## 2019-09-22 ENCOUNTER — Encounter (HOSPITAL_COMMUNITY): Payer: Self-pay | Admitting: Family Medicine

## 2019-09-22 ENCOUNTER — Other Ambulatory Visit: Payer: Self-pay

## 2019-09-22 DIAGNOSIS — J45909 Unspecified asthma, uncomplicated: Secondary | ICD-10-CM | POA: Diagnosis not present

## 2019-09-22 DIAGNOSIS — R110 Nausea: Secondary | ICD-10-CM | POA: Diagnosis present

## 2019-09-22 DIAGNOSIS — Z79899 Other long term (current) drug therapy: Secondary | ICD-10-CM | POA: Insufficient documentation

## 2019-09-22 DIAGNOSIS — F959 Tic disorder, unspecified: Secondary | ICD-10-CM | POA: Insufficient documentation

## 2019-09-22 DIAGNOSIS — F419 Anxiety disorder, unspecified: Secondary | ICD-10-CM | POA: Insufficient documentation

## 2019-09-22 DIAGNOSIS — Z9049 Acquired absence of other specified parts of digestive tract: Secondary | ICD-10-CM | POA: Diagnosis not present

## 2019-09-22 DIAGNOSIS — Z8616 Personal history of COVID-19: Secondary | ICD-10-CM | POA: Diagnosis not present

## 2019-09-22 DIAGNOSIS — K219 Gastro-esophageal reflux disease without esophagitis: Secondary | ICD-10-CM | POA: Diagnosis not present

## 2019-09-22 NOTE — ED Triage Notes (Signed)
Patient is complaining of nausea with no actual vomiting. She was seen yesterday at [facility] ED yesterday for the same symptoms. Dr. Effie Shy diagnosed with anxiety.

## 2019-09-23 ENCOUNTER — Encounter: Payer: Self-pay | Admitting: Internal Medicine

## 2019-09-23 LAB — COMPREHENSIVE METABOLIC PANEL
ALT: 11 U/L (ref 0–44)
AST: 14 U/L — ABNORMAL LOW (ref 15–41)
Albumin: 3.7 g/dL (ref 3.5–5.0)
Alkaline Phosphatase: 60 U/L (ref 38–126)
Anion gap: 7 (ref 5–15)
BUN: 9 mg/dL (ref 6–20)
CO2: 25 mmol/L (ref 22–32)
Calcium: 8.7 mg/dL — ABNORMAL LOW (ref 8.9–10.3)
Chloride: 107 mmol/L (ref 98–111)
Creatinine, Ser: 0.82 mg/dL (ref 0.44–1.00)
GFR calc Af Amer: >60 mL/min (ref >60)
GFR calc non Af Amer: >60 mL/min (ref >60)
Glucose, Bld: 102 mg/dL — ABNORMAL HIGH (ref 70–99)
Potassium: 4 mmol/L (ref 3.5–5.1)
Sodium: 139 mmol/L (ref 135–145)
Total Bilirubin: 0.8 mg/dL (ref 0.3–1.2)
Total Protein: 7.2 g/dL (ref 6.5–8.1)

## 2019-09-23 LAB — CBC
HCT: 40.6 % (ref 36.0–46.0)
Hemoglobin: 12.9 g/dL (ref 12.0–15.0)
MCH: 27.7 pg (ref 26.0–34.0)
MCHC: 31.8 g/dL (ref 30.0–36.0)
MCV: 87.3 fL (ref 80.0–100.0)
Platelets: 269 10*3/uL (ref 150–400)
RBC: 4.65 MIL/uL (ref 3.87–5.11)
RDW: 13 % (ref 11.5–15.5)
WBC: 5.4 10*3/uL (ref 4.0–10.5)
nRBC: 0 % (ref 0.0–0.2)

## 2019-09-23 LAB — I-STAT BETA HCG BLOOD, ED (MC, WL, AP ONLY): I-stat hCG, quantitative: <5 m[IU]/mL (ref <5)

## 2019-09-23 LAB — LIPASE, BLOOD: Lipase: 29 U/L (ref 11–51)

## 2019-09-23 MED ORDER — SODIUM CHLORIDE 0.9% FLUSH
3.0000 mL | Freq: Once | INTRAVENOUS | Status: AC
  Administered 2019-09-23: 3 mL via INTRAVENOUS

## 2019-09-23 MED ORDER — HYOSCYAMINE SULFATE 0.125 MG SL SUBL
0.2500 mg | SUBLINGUAL_TABLET | Freq: Once | SUBLINGUAL | Status: AC
  Administered 2019-09-23: 0.25 mg via SUBLINGUAL
  Filled 2019-09-23: qty 2

## 2019-09-23 MED ORDER — ONDANSETRON 4 MG PO TBDP
4.0000 mg | ORAL_TABLET | Freq: Once | ORAL | Status: DC | PRN
  Filled 2019-09-23: qty 1

## 2019-09-23 MED ORDER — ALUM & MAG HYDROXIDE-SIMETH 200-200-20 MG/5ML PO SUSP
30.0000 mL | Freq: Once | ORAL | Status: AC
  Administered 2019-09-23: 30 mL via ORAL
  Filled 2019-09-23: qty 30

## 2019-09-23 NOTE — ED Provider Notes (Signed)
[facility]-EMERGENCY DEPT Provider Note  CSN: [id number] Arrival date & time: 09/22/19 1940  Chief Complaint(s) Nausea  HPI Amy Mendoza is a 39 y.o. female with a past medical history listed below including recent COVID-19 infection resulting in anxiety and tic disorder recently started on Zoloft by her primary care provider presents to the emergency department with nausea, burning sensation, persistent tics and increased anxiety.  No alleviating or aggravating factors.  Patient denies any acute changes.  These have been ongoing for several weeks.  No fevers or chills.  No chest pain or shortness of breath.  No abdominal pain.  No urinary symptoms.  Patient denies any other physical complaints.  HPI  Past Medical History Past Medical History:  Diagnosis Date  . Asthma   . Gallstones   . Hyperlipemia    Patient Active Problem List   Diagnosis Date Noted  . Generalized anxiety disorder 09/18/2019  . Tic disorder 09/18/2019  . Dry skin dermatitis 09/18/2019  . Dry heaves 09/18/2019  . Personal history of covid-19 09/18/2019  . Obesity, Class III, BMI 40-49.9 (morbid obesity) (HCC) 07/07/2019  . Pneumonia due to COVID-19 virus 07/03/2019  . Acute respiratory failure with hypoxia (HCC) 07/03/2019  . Mild intermittent asthma 07/03/2019  . Routine general medical examination at a health care facility 01/18/2017   Home Medication(s) Prior to Admission medications   Medication Sig Start Date End Date Taking? Authorizing Provider  Ascorbic Acid (VITAMIN C PO) Take 2 tablets by mouth daily.    [provider]  hydrOXYzine (ATARAX/VISTARIL) 25 MG tablet Take 1 tablet (25 mg total) by mouth every 6 (six) hours as needed for anxiety. 09/18/19   Marcine Matar, MD  levonorgestrel (MIRENA) 20 MCG/24HR IUD 1 each by Intrauterine route once.    [provider]  omeprazole (PRILOSEC) 20 MG capsule Take 1 capsule (20 mg total) by mouth 2 (two) times  daily before a meal for 14 days. 08/20/19 09/03/19  Linwood Dibbles, MD  ondansetron (ZOFRAN) 4 MG tablet Take 1 tablet (4 mg total) by mouth daily as needed for nausea or vomiting. 09/18/19   Marcine Matar, MD  sertraline (ZOLOFT) 50 MG tablet Take 1/2 tablet PO daily for the first 2 weeks then after that take 1 tablet daily. 09/18/19   Marcine Matar, MD  triamcinolone cream (KENALOG) 0.1 % Apply 1 application topically 2 (two) times daily as needed. 09/18/19   Marcine Matar, MD  fluticasone (FLONASE) 50 MCG/ACT nasal spray Place 1 spray into both nostrils daily. Patient not taking: Reported on 11/09/2018 07/01/18 04/15/19  Janne Napoleon, NP  potassium chloride SA (K-DUR) 20 MEQ tablet Take 1 tablet (20 mEq total) by mouth 2 (two) times daily for 2 days. Patient not taking: Reported on 07/03/2019 11/09/18 07/31/19  Alvira Monday, MD  Past Surgical History Past Surgical History:  Procedure Laterality Date  . CHOLECYSTECTOMY  feb 2006  . KNEE SURGERY     left   Family History Family History  Problem Relation Age of Onset  . Diabetes Father        MGM, maternal uncle  . Heart failure Father   . Prostate cancer Maternal Grandfather   . Breast cancer Maternal Aunt   . Healthy Mother   . Colon cancer Neg Hx     Social History Social History   Tobacco Use  . Smoking status: Never Smoker  . Smokeless tobacco: Never Used  Substance Use Topics  . Alcohol use: Yes    Comment: occasionally  . Drug use: Yes    Types: Marijuana   Allergies Patient has no known allergies.  Review of Systems Review of Systems All other systems are reviewed and are negative for acute change except as noted in the HPI  Physical Exam Vital Signs  I have reviewed the triage vital signs BP 131/77   Pulse 63   Temp 98.4 F (36.9 C) (Oral)   Resp 13   Ht 5\' 9"  (1.753 m)    Wt 120.2 kg   LMP 08/26/2019   SpO2 100%   BMI 39.13 kg/m   Physical Exam Vitals reviewed.  Constitutional:      General: She is not in acute distress.    Appearance: She is well-developed. She is not diaphoretic.  HENT:     Head: Normocephalic and atraumatic.     Nose: Nose normal.  Eyes:     General: No scleral icterus.       Right eye: No discharge.        Left eye: No discharge.     Conjunctiva/sclera: Conjunctivae normal.     Pupils: Pupils are equal, round, and reactive to light.  Cardiovascular:     Rate and Rhythm: Normal rate and regular rhythm.     Heart sounds: No murmur. No friction rub. No gallop.   Pulmonary:     Effort: Pulmonary effort is normal. No respiratory distress.     Breath sounds: Normal breath sounds. No stridor. No rales.  Abdominal:     General: There is no distension.     Palpations: Abdomen is soft.     Tenderness: There is no abdominal tenderness.  Musculoskeletal:        General: No tenderness.     Cervical back: Normal range of motion and neck supple.  Skin:    General: Skin is warm and dry.     Findings: No erythema or rash.  Neurological:     Mental Status: She is alert and oriented to person, place, and time.     Comments: Intermittent upper body myoclonus and grunting. Resolved with breathing exercises.     ED Results and Treatments Labs (all labs ordered are listed, but only abnormal results are displayed) Labs Reviewed  COMPREHENSIVE METABOLIC PANEL - Abnormal; Notable for the following components:      Result Value   Glucose, Bld 102 (*)    Calcium 8.7 (*)    AST 14 (*)    All other components within normal limits  LIPASE, BLOOD  CBC  I-STAT BETA HCG BLOOD, ED ([facility], [facility], [facility] ONLY)  EKG  EKG Interpretation  Date/Time:  Tuesday September 23 2019 01:19:12 EST Ventricular Rate:  57 PR Interval:    QRS  Duration: 88 QT Interval:  430 QTC Calculation: 419 R Axis:   39 Text Interpretation: Sinus rhythm Low voltage, precordial leads Rate slower Otherwise no significant change Confirmed by Drema Pry [phone]) on 09/23/2019 1:30:53 AM      Radiology No results found.  Pertinent labs & imaging results that were available during my care of the patient were reviewed by me and considered in my medical decision making (see chart for details).  Medications Ordered in ED Medications  ondansetron (ZOFRAN-ODT) disintegrating tablet 4 mg (has no administration in time range)  sodium chloride flush (NS) 0.9 % injection 3 mL (3 mLs Intravenous Given 09/23/19 0127)  alum & mag hydroxide-simeth (MAALOX/MYLANTA) 200-200-20 MG/5ML suspension 30 mL (30 mLs Oral Given 09/23/19 0120)  hyoscyamine (LEVSIN SL) SL tablet 0.25 mg (0.25 mg Sublingual Given 09/23/19 0121)                                                                                                                                    Procedures Procedures  (including critical care time)  Medical Decision Making / ED Course I have reviewed the nursing notes for this encounter and the patient's prior records (if available in EHR or on provided paperwork).   Amy Mendoza was evaluated in Emergency Department on 09/23/2019 for the symptoms described in the history of present illness. She was evaluated in the context of the global COVID-19 pandemic, which necessitated consideration that the patient might be at risk for infection with the SARS-CoV-2 virus that causes COVID-19. Institutional protocols and algorithms that pertain to the evaluation of patients at risk for COVID-19 are in a state of rapid change based on information released by regulatory bodies including the CDC and federal and state organizations. These policies and algorithms were followed during the patient's care in the ED.  The patient appears well, in no acute distress, without  evidence of toxicity or dehydration.  Labs reassuring. Nausea and burning sensation resolved with GI cocktail.  Tics seem to be controlled with cognitive behavioral therapy and breathing exercises.  Feel she would benefit from further behavioral therapy.      Final Clinical Impression(s) / ED Diagnoses Final diagnoses:  Gastroesophageal reflux disease, unspecified whether esophagitis present  Tic disorder  Anxiety   The patient appears reasonably screened and/or stabilized for discharge and I doubt any other medical condition or other [facility] requiring further screening, evaluation, or treatment in the ED at this time prior to discharge. Safe for discharge with strict return precautions.  Disposition: Discharge  Condition: Good  I have discussed the results, Dx and Tx plan with the patient/family who expressed understanding and agree(s) with the plan. Discharge instructions discussed at length. The patient/family was given strict return precautions who verbalized understanding of the instructions. No further  questions at time of discharge.    ED Discharge Orders    None        Follow Up: Marcine Matar, MD [address] [place] [address] [phone]  Call  As needed. As about behavioral therapy treatment      This chart was dictated using voice recognition software.  Despite best efforts to proofread,  errors can occur which can change the documentation meaning.   Nira Conn, MD 09/23/19 [phone]

## 2019-09-23 NOTE — ED Notes (Signed)
Patient tolerating fluids. 

## 2019-09-23 NOTE — ED Notes (Signed)
Patient ambulated to the restroom with no difficulty   ° °

## 2019-09-25 ENCOUNTER — Other Ambulatory Visit: Payer: Self-pay | Admitting: Internal Medicine

## 2019-09-25 DIAGNOSIS — F411 Generalized anxiety disorder: Secondary | ICD-10-CM

## 2019-09-27 ENCOUNTER — Encounter: Payer: Self-pay | Admitting: Internal Medicine

## 2019-10-08 ENCOUNTER — Encounter (HOSPITAL_COMMUNITY): Payer: Self-pay

## 2019-10-08 ENCOUNTER — Other Ambulatory Visit: Payer: Self-pay

## 2019-10-08 ENCOUNTER — Emergency Department (HOSPITAL_COMMUNITY)
Admission: EM | Admit: 2019-10-08 | Discharge: 2019-10-08 | Disposition: A | Discharge status: Home / Self Care | Payer: BLUE CROSS/BLUE SHIELD | Attending: Emergency Medicine | Admitting: Emergency Medicine

## 2019-10-08 DIAGNOSIS — Z5321 Procedure and treatment not carried out due to patient leaving prior to being seen by health care provider: Secondary | ICD-10-CM | POA: Insufficient documentation

## 2019-10-08 DIAGNOSIS — G43909 Migraine, unspecified, not intractable, without status migrainosus: Secondary | ICD-10-CM | POA: Diagnosis not present

## 2019-10-08 NOTE — ED Triage Notes (Signed)
Pt reports migraine for the past week, took excedrin without relief. Pt also c.o fatigue and nausea. Pt a.o, ambulatory.

## 2019-10-08 NOTE — ED Notes (Signed)
Pt did not respond when called for vitals check 

## 2019-10-09 ENCOUNTER — Encounter: Payer: Self-pay | Admitting: Internal Medicine

## 2019-10-10 ENCOUNTER — Other Ambulatory Visit: Payer: Self-pay | Admitting: Internal Medicine

## 2019-10-10 DIAGNOSIS — F411 Generalized anxiety disorder: Secondary | ICD-10-CM

## 2019-10-13 ENCOUNTER — Other Ambulatory Visit: Payer: Self-pay

## 2019-10-13 ENCOUNTER — Emergency Department (HOSPITAL_COMMUNITY)
Admission: EM | Admit: 2019-10-13 | Discharge: 2019-10-14 | Disposition: A | Discharge status: Home / Self Care | Payer: BLUE CROSS/BLUE SHIELD | Attending: Emergency Medicine | Admitting: Emergency Medicine

## 2019-10-13 ENCOUNTER — Encounter (HOSPITAL_COMMUNITY): Payer: Self-pay | Admitting: Emergency Medicine

## 2019-10-13 DIAGNOSIS — R519 Headache, unspecified: Secondary | ICD-10-CM | POA: Diagnosis not present

## 2019-10-13 DIAGNOSIS — Z5321 Procedure and treatment not carried out due to patient leaving prior to being seen by health care provider: Secondary | ICD-10-CM | POA: Insufficient documentation

## 2019-10-13 LAB — COMPREHENSIVE METABOLIC PANEL
ALT: 16 U/L (ref 0–44)
AST: 13 U/L — ABNORMAL LOW (ref 15–41)
Albumin: 3.5 g/dL (ref 3.5–5.0)
Alkaline Phosphatase: 55 U/L (ref 38–126)
Anion gap: 7 (ref 5–15)
BUN: 7 mg/dL (ref 6–20)
CO2: 27 mmol/L (ref 22–32)
Calcium: 9.1 mg/dL (ref 8.9–10.3)
Chloride: 106 mmol/L (ref 98–111)
Creatinine, Ser: 0.93 mg/dL (ref 0.44–1.00)
GFR calc Af Amer: >60 mL/min (ref >60)
GFR calc non Af Amer: >60 mL/min (ref >60)
Glucose, Bld: 108 mg/dL — ABNORMAL HIGH (ref 70–99)
Potassium: 3.9 mmol/L (ref 3.5–5.1)
Sodium: 140 mmol/L (ref 135–145)
Total Bilirubin: 0.8 mg/dL (ref 0.3–1.2)
Total Protein: 6.7 g/dL (ref 6.5–8.1)

## 2019-10-13 LAB — CBC WITH DIFFERENTIAL/PLATELET
Abs Immature Granulocytes: 0.02 10*3/uL (ref 0.00–0.07)
Basophils Absolute: 0.1 10*3/uL (ref 0.0–0.1)
Basophils Relative: 1 %
Eosinophils Absolute: 0.3 10*3/uL (ref 0.0–0.5)
Eosinophils Relative: 3 %
HCT: 41.4 % (ref 36.0–46.0)
Hemoglobin: 13 g/dL (ref 12.0–15.0)
Immature Granulocytes: 0 %
Lymphocytes Relative: 43 %
Lymphs Abs: 3.3 10*3/uL (ref 0.7–4.0)
MCH: 27.4 pg (ref 26.0–34.0)
MCHC: 31.4 g/dL (ref 30.0–36.0)
MCV: 87.3 fL (ref 80.0–100.0)
Monocytes Absolute: 0.5 10*3/uL (ref 0.1–1.0)
Monocytes Relative: 7 %
Neutro Abs: 3.6 10*3/uL (ref 1.7–7.7)
Neutrophils Relative %: 46 %
Platelets: 368 10*3/uL (ref 150–400)
RBC: 4.74 MIL/uL (ref 3.87–5.11)
RDW: 12.7 % (ref 11.5–15.5)
WBC: 7.8 10*3/uL (ref 4.0–10.5)
nRBC: 0 % (ref 0.0–0.2)

## 2019-10-13 LAB — I-STAT BETA HCG BLOOD, ED (MC, WL, AP ONLY): I-stat hCG, quantitative: <5 m[IU]/mL (ref <5)

## 2019-10-13 MED ORDER — ACETAMINOPHEN 325 MG PO TABS
650.0000 mg | ORAL_TABLET | Freq: Once | ORAL | Status: AC
  Administered 2019-10-13: 650 mg via ORAL
  Filled 2019-10-13: qty 2

## 2019-10-13 NOTE — ED Triage Notes (Signed)
Patient arrived with EMS from home reports persistent headache for 1 week with mild photophobia , no head injury , denies emesis or fever .

## 2019-10-14 NOTE — ED Notes (Signed)
Pt called for room placement, no answer.  

## 2019-10-14 NOTE — ED Notes (Signed)
Pt called for room placement x3, no answer

## 2019-10-15 ENCOUNTER — Other Ambulatory Visit: Payer: Self-pay

## 2019-10-15 ENCOUNTER — Emergency Department (HOSPITAL_COMMUNITY)
Admission: EM | Admit: 2019-10-15 | Discharge: 2019-10-15 | Disposition: A | Discharge status: Home / Self Care | Payer: BLUE CROSS/BLUE SHIELD | Attending: Emergency Medicine | Admitting: Emergency Medicine

## 2019-10-15 ENCOUNTER — Encounter (HOSPITAL_COMMUNITY): Payer: Self-pay | Admitting: Emergency Medicine

## 2019-10-15 ENCOUNTER — Telehealth: Payer: Self-pay | Admitting: Internal Medicine

## 2019-10-15 DIAGNOSIS — R0989 Other specified symptoms and signs involving the circulatory and respiratory systems: Secondary | ICD-10-CM | POA: Insufficient documentation

## 2019-10-15 DIAGNOSIS — R209 Unspecified disturbances of skin sensation: Secondary | ICD-10-CM | POA: Insufficient documentation

## 2019-10-15 DIAGNOSIS — R519 Headache, unspecified: Secondary | ICD-10-CM | POA: Diagnosis not present

## 2019-10-15 DIAGNOSIS — J3489 Other specified disorders of nose and nasal sinuses: Secondary | ICD-10-CM | POA: Diagnosis not present

## 2019-10-15 DIAGNOSIS — J45909 Unspecified asthma, uncomplicated: Secondary | ICD-10-CM | POA: Insufficient documentation

## 2019-10-15 DIAGNOSIS — Z8616 Personal history of COVID-19: Secondary | ICD-10-CM | POA: Insufficient documentation

## 2019-10-15 HISTORY — DX: Anxiety disorder, unspecified: F41.9

## 2019-10-15 MED ORDER — DIPHENHYDRAMINE HCL 25 MG PO CAPS
50.0000 mg | ORAL_CAPSULE | Freq: Once | ORAL | Status: AC
  Administered 2019-10-15: 50 mg via ORAL
  Filled 2019-10-15: qty 2

## 2019-10-15 MED ORDER — METOCLOPRAMIDE HCL 10 MG PO TABS: 10.0000 mg | ORAL_TABLET | Freq: Four times a day (QID) | ORAL | 0 refills | Status: DC | PRN

## 2019-10-15 MED ORDER — KETOROLAC TROMETHAMINE 10 MG PO TABS: 10.0000 mg | ORAL_TABLET | Freq: Four times a day (QID) | ORAL | 0 refills | Status: DC | PRN

## 2019-10-15 MED ORDER — METOCLOPRAMIDE HCL 10 MG PO TABS
10.0000 mg | ORAL_TABLET | Freq: Once | ORAL | Status: AC
  Administered 2019-10-15: 10 mg via ORAL
  Filled 2019-10-15: qty 1

## 2019-10-15 MED ORDER — KETOROLAC TROMETHAMINE 30 MG/ML IJ SOLN
30.0000 mg | Freq: Once | INTRAMUSCULAR | Status: AC
  Administered 2019-10-15: 30 mg via INTRAMUSCULAR
  Filled 2019-10-15: qty 1

## 2019-10-15 NOTE — ED Provider Notes (Signed)
Emergency Department Provider Note   I have reviewed the triage vital signs and the nursing notes.   HISTORY  Chief Complaint Migraine   HPI Amy Mendoza is a 39 y.o. female the past medical history reviewed below presents to the emergency department with headache over the past week.  She describes intermittent symptoms with migratory pain in different areas of her head.  She states it feels like prior headaches that she is had in the past that she describes as migraines.  She feels sensitive to light and sound.  She denies fevers.  No vision changes.  Denies unilateral numbness or weakness in the arms or legs but sometimes will feel waves of hot/cold going through her body.  She also has intermittent sensation of nasal congestion and throat tightness but not currently.  No rashes.  She has an appointment to see neurology in late April.  She was in the emergency department yesterday, at another location, and reports receiving a migraine cocktail which improved her symptoms but then headache returned. She reports a COVID diagnosis in December 2020.    Past Medical History:  Diagnosis Date  . Anxiety   . Asthma   . Gallstones   . Hyperlipemia     Patient Active Problem List   Diagnosis Date Noted  . Generalized anxiety disorder 09/18/2019  . Tic disorder 09/18/2019  . Dry skin dermatitis 09/18/2019  . Dry heaves 09/18/2019  . Personal history of covid-19 09/18/2019  . Obesity, Class III, BMI 40-49.9 (morbid obesity) ([place]) 07/07/2019  . Pneumonia due to COVID-19 virus 07/03/2019  . Acute respiratory failure with hypoxia ([place]) 07/03/2019  . Mild intermittent asthma 07/03/2019  . Routine general medical examination at a health care facility 01/18/2017    Past Surgical History:  Procedure Laterality Date  . CHOLECYSTECTOMY  feb 2006  . KNEE SURGERY     left    Allergies Patient has no known allergies.  Family History  Problem Relation Age of Onset  . Diabetes  Father        MGM, maternal uncle  . Heart failure Father   . Prostate cancer Maternal Grandfather   . Breast cancer Maternal Aunt   . Healthy Mother   . Colon cancer Neg Hx     Social History Social History   Tobacco Use  . Smoking status: Never Smoker  . Smokeless tobacco: Never Used  Substance Use Topics  . Alcohol use: Yes    Comment: occasionally  . Drug use: Yes    Types: Marijuana    Review of Systems  Constitutional: No fever/chills Eyes: No visual changes. Positive photophobia.  ENT: No sore throat. Intermittent throat tightness.  Cardiovascular: Denies chest pain. Respiratory: Denies shortness of breath. Gastrointestinal: No abdominal pain.  No nausea, no vomiting.  No diarrhea.  No constipation. Genitourinary: Negative for dysuria. Musculoskeletal: Negative for back pain. Skin: Negative for rash. Neurological: Negative for focal weakness or numbness. Positive HA.   10-point ROS otherwise negative.  ____________________________________________   PHYSICAL EXAM:  VITAL SIGNS: ED Triage Vitals  Enc Vitals Group     BP 10/15/19 2057 122/69     Pulse Rate 10/15/19 2057 96     Resp 10/15/19 2057 18     Temp 10/15/19 2057 98.3 F (36.8 C)     Temp Source 10/15/19 2057 Oral     SpO2 10/15/19 2057 98 %   Constitutional: Alert and oriented. Well appearing and in no acute distress. Eyes: Conjunctivae are normal.  PERRL. Positive photophobia.  Head: Atraumatic. Nose: No congestion/rhinnorhea. Mouth/Throat: Mucous membranes are moist.   Neck: No stridor.   Cardiovascular: Normal rate, regular rhythm. Good peripheral circulation. Grossly normal heart sounds.   Respiratory: Normal respiratory effort.  No retractions. Lungs CTAB. Gastrointestinal: Soft and nontender. No distention.  Musculoskeletal: No lower extremity tenderness nor edema. No gross deformities of extremities. Neurologic:  Normal speech and language. No gross focal neurologic deficits are  appreciated. CN 2-12 intact. Normal strength and sensation of the bilateral upper and lower extremities.  Skin:  Skin is warm, dry and intact. No rash noted.  ____________________________________________   PROCEDURES  Procedure(s) performed:   Procedures  None ____________________________________________   INITIAL IMPRESSION / ASSESSMENT AND PLAN / ED COURSE  Pertinent labs & imaging results that were available during my care of the patient were reviewed by me and considered in my medical decision making (see chart for details).   Patient presents to the emergency department for evaluation of migratory headache symptoms which are fairly atypical.  She does have photophobia and phonophobia but headache pattern seems most consistent with tension type headache.  She was seen in the [facility] emergency department yesterday.  I reviewed that note.  Lab work including pregnancy test negative yesterday.  Patient has no focal neurologic deficits on my exam.  She is afebrile and well-appearing.  She has a follow-up appointment scheduled with neurology in late April.  Plan to improve symptoms and likely discharge home with further management there.  I do not see an indication for emergency head imaging at this time.   10:43 PM  Patient reassessed and improved. Will discharge home with Toradol and Reglan. Placed Neuro referral as well to help expedite outpatient f/u with multiple ED visits. Discussed ED return precautions.    ____________________________________________  FINAL CLINICAL IMPRESSION(S) / ED DIAGNOSES  Final diagnoses:  Acute nonintractable headache, unspecified headache type     MEDICATIONS GIVEN DURING THIS VISIT:  Medications  ketorolac (TORADOL) 30 MG/ML injection 30 mg (30 mg Intramuscular Given 10/15/19 2159)  metoCLOPramide (REGLAN) tablet 10 mg (10 mg Oral Given 10/15/19 2159)  diphenhydrAMINE (BENADRYL) capsule 50 mg (50 mg Oral Given 10/15/19 2159)     NEW OUTPATIENT  MEDICATIONS STARTED DURING THIS VISIT:  New Prescriptions   KETOROLAC (TORADOL) 10 MG TABLET    Take 1 tablet (10 mg total) by mouth every 6 (six) hours as needed for moderate pain.   METOCLOPRAMIDE (REGLAN) 10 MG TABLET    Take 1 tablet (10 mg total) by mouth every 6 (six) hours as needed for nausea or vomiting (headache).    Note:  This document was prepared using Dragon voice recognition software and may include unintentional dictation errors.  Alona Bene, MD, [facility]    Alvino Lechuga, Arlyss Repress, MD 10/15/19 [phone]

## 2019-10-15 NOTE — Telephone Encounter (Signed)
Patient was called, no answer, LVM informing patient to call [facility] to schedule an UC f/u for headaches.

## 2019-10-15 NOTE — Discharge Instructions (Signed)

## 2019-10-15 NOTE — Telephone Encounter (Signed)
-----   Message from Particia Lather, [place] sent at 10/10/2019 11:46 AM EDT ----- Regarding: FW: needs UC appt with someone next week for headache.  ----- Message ----- From: Marcine Matar, MD Sent: 10/09/2019   9:02 PM EDT To: Particia Lather, RMA Subject: needs UC appt with someone next week for hea#

## 2019-10-15 NOTE — ED Triage Notes (Addendum)
Patient presents with migraine like pain that moves around. Patient also complaining of facial pressure. Patient states that she is having chest pain, which is chronic. Patient intermittently grunting/clearing her throat during exam, which she explains as "fits." Patient noted to have been seen recently for similar symptoms.

## 2019-10-19 ENCOUNTER — Other Ambulatory Visit: Payer: Self-pay

## 2019-10-19 ENCOUNTER — Emergency Department (HOSPITAL_COMMUNITY)
Admission: EM | Admit: 2019-10-19 | Discharge: 2019-10-19 | Disposition: A | Discharge status: Home / Self Care | Payer: BLUE CROSS/BLUE SHIELD | Attending: Emergency Medicine | Admitting: Emergency Medicine

## 2019-10-19 DIAGNOSIS — J0100 Acute maxillary sinusitis, unspecified: Secondary | ICD-10-CM | POA: Insufficient documentation

## 2019-10-19 DIAGNOSIS — J45909 Unspecified asthma, uncomplicated: Secondary | ICD-10-CM | POA: Insufficient documentation

## 2019-10-19 DIAGNOSIS — Z79899 Other long term (current) drug therapy: Secondary | ICD-10-CM | POA: Diagnosis not present

## 2019-10-19 DIAGNOSIS — R519 Headache, unspecified: Secondary | ICD-10-CM | POA: Diagnosis present

## 2019-10-19 MED ORDER — KETOROLAC TROMETHAMINE 30 MG/ML IJ SOLN
60.0000 mg | Freq: Once | INTRAMUSCULAR | Status: AC
  Administered 2019-10-19: 60 mg via INTRAMUSCULAR
  Filled 2019-10-19: qty 2

## 2019-10-19 MED ORDER — FLUTICASONE PROPIONATE 50 MCG/ACT NA SUSP: 2.0000 | Freq: Every day | NASAL | 2 refills | Status: DC

## 2019-10-19 NOTE — ED Triage Notes (Signed)
Pt to Ed with complaints of Facial pain/jawpain/headahce pain. Pt states the pain radiated from her lateral sides of face to posterior head. Pt was here recently for this pressure and pain and states the medicine given doesn't help relieve the pain.Pt states pain radiates down her throat and then has trouble swallowing. Pt rates pain 10/10.

## 2019-10-19 NOTE — ED Provider Notes (Signed)
[facility] DEPT Provider Note   CSN: [id number] Arrival date & time: 10/19/19  1756     History Chief Complaint  Patient presents with  . Facial Pain    Amy Mendoza is a 39 y.o. female with PMH of asthma, HLD, and anxiety presents to the ED complaints of 10 out of 10 facial pressure and pain.  Patient states that her symptoms have developed over the course of the past few days since she was evaluated in the ER and treated for migraine headache.  She states that she has had runny nose, itchy eyes, itchy ears, and "pressure" sensation.  She states that it will occasionally radiate into her jaw and cause worsening headache symptoms.  She has been taking her ketorolac and Reglan with little relief.  I reviewed patient's medical record and she was evaluated on 10/15/2019 for acute non-intractable headache and encouraged to call [facility] to schedule an appointment for work-up.  She already has a neurology appointment scheduled for 11/12/2019, this was given in an effort to expedite her assessment.  However, she states she has not yet called them and will do it first thing tomorrow.  She denies any vision changes, fevers or chills, neck pain or swelling, chest pain or difficulty breathing, weakness or numbness, recent illness, or other symptoms.  Patient states that she was COVID-19 positive in 2020 and she has since been experiencing a lot of post FBPZW-25 complications, this being the most recent.  She also has been informed by her primary care provider that at least some of are symptoms are partially related to anxiety, which she acknowledges.  HPI     Past Medical History:  Diagnosis Date  . Anxiety   . Asthma   . Gallstones   . Hyperlipemia     Patient Active Problem List   Diagnosis Date Noted  . Generalized anxiety disorder 09/18/2019  . Tic disorder 09/18/2019  . Dry skin dermatitis 09/18/2019  . Dry heaves 09/18/2019  .  Personal history of covid-19 09/18/2019  . Obesity, Class III, BMI 40-49.9 (morbid obesity) ([place]) 07/07/2019  . Pneumonia due to COVID-19 virus 07/03/2019  . Acute respiratory failure with hypoxia ([place]) 07/03/2019  . Mild intermittent asthma 07/03/2019  . Routine general medical examination at a health care facility 01/18/2017    Past Surgical History:  Procedure Laterality Date  . CHOLECYSTECTOMY  feb 2006  . KNEE SURGERY     left     OB History   No obstetric history on file.     Family History  Problem Relation Age of Onset  . Diabetes Father        MGM, maternal uncle  . Heart failure Father   . Prostate cancer Maternal Grandfather   . Breast cancer Maternal Aunt   . Healthy Mother   . Colon cancer Neg Hx     Social History   Tobacco Use  . Smoking status: Never Smoker  . Smokeless tobacco: Never Used  Substance Use Topics  . Alcohol use: Yes    Comment: occasionally  . Drug use: Yes    Types: Marijuana    Home Medications Prior to Admission medications   Medication Sig Start Date End Date Taking? Authorizing Provider  fluticasone (FLONASE) 50 MCG/ACT nasal spray Place 2 sprays into both nostrils daily. 10/19/19   Corena Herter, PA-C  hydrOXYzine (ATARAX/VISTARIL) 25 MG tablet Take 1 tablet (25 mg total) by mouth every 6 (six) hours as needed  for anxiety. 09/18/19   Marcine Matar, MD  ketorolac (TORADOL) 10 MG tablet Take 1 tablet (10 mg total) by mouth every 6 (six) hours as needed for moderate pain. 10/15/19   Long, Arlyss Repress, MD  levonorgestrel (MIRENA) 20 MCG/24HR IUD 1 each by Intrauterine route once.    [provider]  metoCLOPramide (REGLAN) 10 MG tablet Take 1 tablet (10 mg total) by mouth every 6 (six) hours as needed for nausea or vomiting (headache). 10/15/19   Long, Arlyss Repress, MD  Multiple Vitamin (MULTIVITAMIN WITH MINERALS) TABS tablet Take 1 tablet by mouth daily.    [provider]  omeprazole (PRILOSEC) 20 MG capsule Take 1  capsule (20 mg total) by mouth 2 (two) times daily before a meal for 14 days. Patient not taking: Reported on 10/15/2019 08/20/19 09/03/19  Linwood Dibbles, MD  ondansetron (ZOFRAN) 4 MG tablet Take 1 tablet (4 mg total) by mouth daily as needed for nausea or vomiting. 09/18/19   Marcine Matar, MD  pseudoephedrine (SUDAFED) 30 MG tablet Take 30 mg by mouth every 4 (four) hours as needed for congestion.    [provider]  sertraline (ZOLOFT) 50 MG tablet TAKE 1/2 TABLET DAILY FOR THE FIRST 2 WEEKS THEN AFTER THAT TAKE 1 TABLET DAILY. Dx: F41.1 10/10/19   Marcine Matar, MD  triamcinolone cream (KENALOG) 0.1 % Apply 1 application topically 2 (two) times daily as needed. Patient not taking: Reported on 10/15/2019 09/18/19   Marcine Matar, MD  potassium chloride SA (K-DUR) 20 MEQ tablet Take 1 tablet (20 mEq total) by mouth 2 (two) times daily for 2 days. Patient not taking: Reported on 07/03/2019 11/09/18 07/31/19  Alvira Monday, MD    Allergies    Patient has no known allergies.  Review of Systems   Review of Systems  All other systems reviewed and are negative.   Physical Exam Updated Vital Signs BP 114/75   Pulse 87   Temp 98.3 F (36.8 C) (Oral)   Resp 17   Ht 5\' 9"  (1.753 m)   Wt 120.2 kg   SpO2 100%   BMI 39.13 kg/m   Physical Exam Vitals and nursing note reviewed. Exam conducted with a chaperone present.  Constitutional:      Appearance: Normal appearance. She is not ill-appearing.  HENT:     Head: Normocephalic and atraumatic.     Comments: Maxillary tenderness to palpation, particularly on left side.  Discomfort and pressure worse with leaning forward.    Right Ear: Tympanic membrane normal. There is no impacted cerumen.     Left Ear: Tympanic membrane normal. There is no impacted cerumen.     Nose: Congestion and rhinorrhea present.     Mouth/Throat:     Comments: Patent oropharynx, nonerythematous.  No tonsil hypertrophy or exudates noted.  No tongue  swelling or floor of mouth induration.  No trismus.  Tolerating secretions well.   Eyes:     General: No scleral icterus.    Conjunctiva/sclera: Conjunctivae normal.  Cardiovascular:     Rate and Rhythm: Normal rate and regular rhythm.     Pulses: Normal pulses.     Heart sounds: Normal heart sounds.  Pulmonary:     Effort: Pulmonary effort is normal. No respiratory distress.     Breath sounds: Normal breath sounds.  Musculoskeletal:     Cervical back: Normal range of motion and neck supple. No rigidity.  Skin:    General: Skin is dry.  Capillary Refill: Capillary refill takes less than 2 seconds.  Neurological:     Mental Status: She is alert and oriented to person, place, and time.     GCS: GCS eye subscore is 4. GCS verbal subscore is 5. GCS motor subscore is 6.  Psychiatric:        Mood and Affect: Mood normal.        Thought Content: Thought content normal.     Comments: Anxious.      ED Results / Procedures / Treatments   Labs (all labs ordered are listed, but only abnormal results are displayed) Labs Reviewed - No data to display  EKG None  Radiology No results found.  Procedures Procedures (including critical care time)  Medications Ordered in ED Medications - No data to display  ED Course  I have reviewed the triage vital signs and the nursing notes.  Pertinent labs & imaging results that were available during my care of the patient were reviewed by me and considered in my medical decision making (see chart for details).    MDM Rules/Calculators/A&P                      Patient's history and physical exam is consistent with sinus headache and acute sinusitis.  Do not need to treat with antibiotics.  She does not have a history of sinus infections and there has been no fevers, chills, difficulty eating or drinking, difficulty swallowing, purulent discharge, or other concerning history.  She has maxillary tenderness to palpation bilaterally, however worse  on the left side.  Her oropharyngeal exam is benign.  Low suspicion for any deep tissue infection.  Do not feel as though laboratory work-up is warranted at this time.  While she may benefit from ENT evaluation for imaging including CT without contrast, given that this is her first episode of sinusitis, do not feel as though it is warranted.  However, will recommend the patient use Flonase, antihistamines, and nasal rinses.  Her vital signs are all well within normal limits and she is not ill-appearing.  Recommending that she continue to take her ketorolac as needed for her headache symptoms.  Also advising her to follow-up with [facility] as directed by prior ED provider and for ongoing evaluation and management of her headache disorder.  Low suspicion for trigeminal neuralgia at this time.  No significant discomfort with light touch.  Not described as "lightning".  Describes it as more of an aching discomfort.  Discussed case with Dr. Rubin Payor who agrees with assessment and plan.  Prior to discharge, was complaining of headache symptoms and was treated with IM Toradol.  She just recently had COVID-19 testing and is declining at this time.  Strict ED return precautions discussed with the patient.  Patient voices understanding and is agreeable to plan.   Final Clinical Impression(s) / ED Diagnoses Final diagnoses:  Acute non-recurrent maxillary sinusitis    Rx / DC Orders ED Discharge Orders         Ordered    fluticasone (FLONASE) 50 MCG/ACT nasal spray  Daily     10/19/19 2050           Lorelee New, PA-C 10/19/19 2052    Benjiman Core, MD 10/19/19 [phone]

## 2019-10-19 NOTE — Discharge Instructions (Signed)
Please use the Flonase, as prescribed.  Please also obtain over-the-counter antihistamine such as cetirizine for regular use.  I would also like for you to read the attachment on nasal rinses as that would be very helpful for your symptoms here today.  Keep taking your prescribed ketorolac, as directed.   Please call [facility] to schedule appointment for ongoing evaluation and management of your headache symptoms.   Please return to the ED should you experience any new or worsening symptoms.

## 2019-10-20 ENCOUNTER — Ambulatory Visit: Payer: BLUE CROSS/BLUE SHIELD | Attending: Internal Medicine | Admitting: Family

## 2019-10-20 ENCOUNTER — Encounter: Payer: Self-pay | Admitting: Internal Medicine

## 2019-10-20 DIAGNOSIS — G43009 Migraine without aura, not intractable, without status migrainosus: Secondary | ICD-10-CM | POA: Diagnosis not present

## 2019-10-20 MED ORDER — SUMATRIPTAN SUCCINATE 25 MG PO TABS: ORAL_TABLET | ORAL | 0 refills | Status: DC

## 2019-10-20 MED ORDER — SUMATRIPTAN SUCCINATE 50 MG PO TABS: ORAL_TABLET | ORAL | 1 refills | Status: DC

## 2019-10-20 NOTE — Progress Notes (Signed)
Virtual Visit via Telephone Note  I connected with Amy Mendoza, on 10/20/2019 at 8:56 AM by telephone due to the COVID-19 pandemic and verified that I am speaking with the correct person using two identifiers.  Due to current restrictions/limitations of in-office visits due to the COVID-19 pandemic, this scheduled clinical appointment was converted to a telehealth visit.   Consent: I discussed the limitations, risks, security and privacy concerns of performing an evaluation and management service by telephone and the availability of in person appointments. I also discussed with the patient that there may be a patient responsible charge related to this service. The patient expressed understanding and agreed to proceed.  Location of Patient: Home  Location of Provider: [place] [facility] [place]  Persons participating in Telemedicine visit: Alonza Bogus, NP Orlan Leavens, [place]  History of Present Illness: Amy Mendoza is a 39 year old female with history of pneumonia due to Covid-19 virus, acute respiratory failure with hypoxia, mild intermittent asthma, tic disorder, dry skin dermatitis, obesity class III, BMI 40-49.9 morbid obesity, generalized anxiety disorder, and dry heaves. Today's primary concern are headaches.   1. HEADACHE FOLLOW-UP:   Description of Headaches: Location of pain: left-sided unilateral, occipital, frontal, location varies  Radiation of pain?:left-sided unilateral  Character of pain:aching, crushing, sharp and sometimes pain feels like a screwdriver digging, pain comes and goes  Severity of pain: 10 Accompanying symptoms: Reports that she is sensitive to light and wears sunglasses outside to help with this. Nausea. Prodromal sx?: denies.  Rapidity of onset: gradual and randomly occurs Typical duration of individual headache: 1 hour Are most headaches similar in presentation? yes but recently with more pain and pressure of the  face Typical precipitants: none  Temporal Pattern of Headaches: Started having HAs 2 months ago  Worst time of day: none headaches comes and goes Awaken from sleep?: no Seasonal pattern?: no 'Clustering' of HAs over time? No, headaches comes and goes  Overall pattern since problem began: gradually worsening  Degree of Functional Impairment: moderate   Current Use of Meds to Treat HA: Abortive meds? NSAIDs (Ketorolac) Daily use? yes - as prescribed Prophylactic meds? none  Additional Relevant History: History of head/neck trauma? no History of head/neck surgery? no Family h/o headache problems? no Use of meds that might worsen HAs? no Exposure to carbon monoxide? no Substance use: denies  Comments: During today's visit patient confirmed she has a neurologist appointment on November 12, 2019 at [facility].   Last visit October 19, 2019 at [facility] emergency department by Dr. Alvino Chapel.  During that encounter patient's history and physical exam were consistent with sinus headache and acute sinusitis. Patient has positive history of sinus infections.  Antibiotics were not needed for treatment as there were no fevers, chills, difficulty eating or drinking, difficulty swallowing, purulent discharge, or other concerning history. On physical examination there was maxillary tenderness to palpation bilaterally however, worse on the left side.  The oropharyngeal exam was benign with low suspicion of deep tissue infection.  Laboratory work-up was not warranted at that time.  ENT evaluation for imaging including CT without contrast was considered but determined not needed at that time. There was low suspicion of trigeminal neuralgia at that time. Patient discharged and recommended to use Flonase, antihistamines, nasal rinses, and to continue taking ketorolac as needed for headache symptoms.    Last visit October 15, 2019 at [facility] emergency department by Dr.  Laverta Baltimore.  During that encounter  patient presented with atypical migratory headache symptoms.  At that time patient had photophobia and phonophobia and headache pattern seemed more consistent with tension type headache.  Lab work and pregnancy test resulted negative the day before.  No focal neurologic deficits on exam she was afebrile and well-appearing. Patient reported that she has a neurology appointment in late April.  There was no indication for emergency head imaging at that time. Upon reassessment patient was improved and discharged home with ketorolac and Reglan.  Neurology referral was placed to help expedite outpatient referral related to multiple emergency department visits.  Last visit at [facility] on October 10, 2019 by Dr. Judithe Modest.  During that encounter it was determined that left ventricular systolic function was normal with unremarkable echocardiogram.   Past Medical History:  Diagnosis Date  . Anxiety   . Asthma   . Gallstones   . Hyperlipemia    No Known Allergies  Current Outpatient Medications on File Prior to Visit  Medication Sig Dispense Refill  . fluticasone (FLONASE) 50 MCG/ACT nasal spray Place 2 sprays into both nostrils daily. 9.9 mL 2  . hydrOXYzine (ATARAX/VISTARIL) 25 MG tablet Take 1 tablet (25 mg total) by mouth every 6 (six) hours as needed for anxiety. 60 tablet 1  . ketorolac (TORADOL) 10 MG tablet Take 1 tablet (10 mg total) by mouth every 6 (six) hours as needed for moderate pain. 20 tablet 0  . levonorgestrel (MIRENA) 20 MCG/24HR IUD 1 each by Intrauterine route once.    . metoCLOPramide (REGLAN) 10 MG tablet Take 1 tablet (10 mg total) by mouth every 6 (six) hours as needed for nausea or vomiting (headache). 20 tablet 0  . Multiple Vitamin (MULTIVITAMIN WITH MINERALS) TABS tablet Take 1 tablet by mouth daily.    . ondansetron (ZOFRAN) 4 MG tablet Take 1 tablet (4 mg total) by mouth daily as needed for nausea or vomiting. 30 tablet 1  .  pseudoephedrine (SUDAFED) 30 MG tablet Take 30 mg by mouth every 4 (four) hours as needed for congestion.    . triamcinolone cream (KENALOG) 0.1 % Apply 1 application topically 2 (two) times daily as needed. 80 g 0  . omeprazole (PRILOSEC) 20 MG capsule Take 1 capsule (20 mg total) by mouth 2 (two) times daily before a meal for 14 days. (Patient not taking: Reported on 10/15/2019) 28 capsule 0  . sertraline (ZOLOFT) 50 MG tablet TAKE 1/2 TABLET DAILY FOR THE FIRST 2 WEEKS THEN AFTER THAT TAKE 1 TABLET DAILY. Dx: F41.1 (Patient not taking: Reported on 10/20/2019) 90 tablet 0  . [DISCONTINUED] potassium chloride SA (K-DUR) 20 MEQ tablet Take 1 tablet (20 mEq total) by mouth 2 (two) times daily for 2 days. (Patient not taking: Reported on 07/03/2019) 4 tablet 0   No current facility-administered medications on file prior to visit.    Observations/Objective: Alert and oriented x 3. Not in acute distress. Physical examination not completed as this is a telemedicine visit.  Assessment and Plan: 1. Migraine without aura and without status migrainosus, not intractable: -History of headaches are most consistent with migraines.  -Discontinue ketorolac as this medication has been ineffective in headache treatment for patient. -Will begin course of sumatriptan for abortive management of headaches. - SUMAtriptan (IMITREX) 50 MG tablet; Take 1 tablet (50 mg total) at the start of headache. May repeat 1 tablet (50 mg total) in 2 hours if headache persists or recurs. Maximum of 2 pills per 24 hours.  Dispense: 10  tablet; Refill: 1 -Encouraged patient to keep appointment with [facility] on November 12, 2019. -Patient was given clear instructions to go to Emergency Department or return to medical center if symptoms don't improve, worsen, or new problems develop.The patient verbalized understanding.  Follow Up Instructions: Follow-up with primary provider as needed.   I discussed the assessment  and treatment plan with the patient. The patient was provided an opportunity to ask questions and all were answered. The patient agreed with the plan and demonstrated an understanding of the instructions.   The patient was advised to call back or seek an in-person evaluation if the symptoms worsen or if the condition fails to improve as anticipated.  I provided 25 minutes total of non-face-to-face time during this encounter including median intraservice time, reviewing previous notes, labs, imaging, medications, management and patient verbalized understanding.  Rema Fendt, NP  [facility] [place], [place] [phone]   10/20/2019, 8:56 AM

## 2019-10-20 NOTE — Patient Instructions (Signed)
Imitrex for migraines. Follow-up with referral to neurology. Migraine Headache A migraine headache is a very strong throbbing pain on one side or both sides of your head. This type of headache can also cause other symptoms. It can last from 4 hours to 3 days. Talk with your doctor about what things may bring on (trigger) this condition. What are the causes? The exact cause of this condition is not known. This condition may be triggered or caused by:  Drinking alcohol.  Smoking.  Taking medicines, such as: ? Medicine used to treat chest pain (nitroglycerin). ? Birth control pills. ? Estrogen. ? Some blood pressure medicines.  Eating or drinking certain products.  Doing physical activity. Other things that may trigger a migraine headache include:  Having a menstrual period.  Pregnancy.  Hunger.  Stress.  Not getting enough sleep or getting too much sleep.  Weather changes.  Tiredness (fatigue). What increases the risk?  Being 54-9 years old.  Being female.  Having a family history of migraine headaches.  Being Caucasian.  Having depression or anxiety.  Being very overweight. What are the signs or symptoms?  A throbbing pain. This pain may: ? Happen in any area of the head, such as on one side or both sides. ? Make it hard to do daily activities. ? Get worse with physical activity. ? Get worse around bright lights or loud noises.  Other symptoms may include: ? Feeling sick to your stomach (nauseous). ? Vomiting. ? Dizziness. ? Being sensitive to bright lights, loud noises, or smells.  Before you get a migraine headache, you may get warning signs (an aura). An aura may include: ? Seeing flashing lights or having blind spots. ? Seeing bright spots, halos, or zigzag lines. ? Having tunnel vision or blurred vision. ? Having numbness or a tingling feeling. ? Having trouble talking. ? Having weak muscles.  Some people have symptoms after a migraine  headache (postdromal phase), such as: ? Tiredness. ? Trouble thinking (concentrating). How is this treated?  Taking medicines that: ? Relieve pain. ? Relieve the feeling of being sick to your stomach. ? Prevent migraine headaches.  Treatment may also include: ? Having acupuncture. ? Avoiding foods that bring on migraine headaches. ? Learning ways to control your body functions (biofeedback). ? Therapy to help you know and deal with negative thoughts (cognitive behavioral therapy). Follow these instructions at home: Medicines  Take over-the-counter and prescription medicines only as told by your doctor.  Ask your doctor if the medicine prescribed to you: ? Requires you to avoid driving or using heavy machinery. ? Can cause trouble pooping (constipation). You may need to take these steps to prevent or treat trouble pooping:  Drink enough fluid to keep your pee (urine) pale yellow.  Take over-the-counter or prescription medicines.  Eat foods that are high in fiber. These include beans, whole grains, and fresh fruits and vegetables.  Limit foods that are high in fat and sugar. These include fried or sweet foods. Lifestyle  Do not drink alcohol.  Do not use any products that contain nicotine or tobacco, such as cigarettes, e-cigarettes, and chewing tobacco. If you need help quitting, ask your doctor.  Get at least 8 hours of sleep every night.  Limit and deal with stress. General instructions      Keep a journal to find out what may bring on your migraine headaches. For example, write down: ? What you eat and drink. ? How much sleep you get. ? Any change  in what you eat or drink. ? Any change in your medicines.  If you have a migraine headache: ? Avoid things that make your symptoms worse, such as bright lights. ? It may help to lie down in a dark, quiet room. ? Do not drive or use heavy machinery. ? Ask your doctor what activities are safe for you.  Keep all  follow-up visits as told by your doctor. This is important. Contact a doctor if:  You get a migraine headache that is different or worse than others you have had.  You have more than 15 headache days in one month. Get help right away if:  Your migraine headache gets very bad.  Your migraine headache lasts longer than 72 hours.  You have a fever.  You have a stiff neck.  You have trouble seeing.  Your muscles feel weak or like you cannot control them.  You start to lose your balance a lot.  You start to have trouble walking.  You pass out (faint).  You have a seizure. Summary  A migraine headache is a very strong throbbing pain on one side or both sides of your head. These headaches can also cause other symptoms.  This condition may be treated with medicines and changes to your lifestyle.  Keep a journal to find out what may bring on your migraine headaches.  Contact a doctor if you get a migraine headache that is different or worse than others you have had.  Contact your doctor if you have more than 15 headache days in a month. This information is not intended to replace advice given to you by your health care provider. Make sure you discuss any questions you have with your health care provider. Document Revised: 10/25/2018 Document Reviewed: 08/15/2018 Elsevier Patient Education  [place].

## 2019-10-20 NOTE — Progress Notes (Signed)
Ongoing migraine. Has a lot of pain andd pressure in face. Was told she has a sinus infection when she went to ED. Encouraged to get OTC medication cetirizine. Also went to ED on Saturday.   Would like to have Neurology appt expedited.   No longer takes Zoloft.

## 2019-10-20 NOTE — Progress Notes (Signed)
[facility]    Provider:  Dr Lucia Gaskins Requesting Provider: Marcine Matar, MD Primary Care Provider:  Marcine Matar, MD  CC:  Migraines and tics  HPI:  Amy Mendoza is a 39 y.o. female here as requested by Marcine Matar, MD for tic disorder. PMHx anxiety, obesity. Reviewed Dr. Henriette Combs notes: she developed body twitching in early January, the twitching comes "in waves" some days no twitching at all, she stopped drinking caffeine. She was started on zoloft. I reviewed ED notes: She presented to the ED 3/9 for persistent tics and anxiety disorder, tics controlled with cognitive behavioral therapy and breathing exercises and they thought she would benefit from behavioral therapy. She also presented 3/24 for migraine to the ER and then again on 3/29. She was in the ED 3/31 as well for migraine, facial pressure, chest pains, grunting and clearing her throat during exam which she describes as "fits". She then presented to the ER again 10/19/19 for facial pressure and pain, taking ketorolac and reglan, and also stated that many symptoms likely due to anxiety, she had maxillary tenderness and dxed with a sinus headache and told to use flonase, antihistamines and nasal rinses. She has also visited the ED at [facility] as well on 3/30 with similar headache symptoms, diagnosed with acute tension-type headache and motor and vocal tics, exam was normal per notes. She has also been evaluated by cardiology for palpitations in the past per review of records as well.   She is here alone. We discussed due to limited time we will focus on migraines today, I will address tic disorder briefly but patient should really be seen in psychiatry for this disorder as they can provide cognitive behavioral therapy or other therapy and can also treat with medication. She has a hx of migraines around menses for many years episodic but after covid started having significant headaches and pressure in the head  and face, she had Covid in [place] and was hospitalized, she was home on Dec 22nd, the following week she was feeling better but still with significant fatigue. January 8th she went to the emergency room, she had chest pain, felt like she was dying, they thought she was having an anxiety atack she had multiple visits for this. In February headaches worsened, has progressively gotten worse and she went to the ED many times for headache as well. She feels like electric shocks in the head, pressure in the head severe, worst headaches of life and "brain zaps". Sensory changes left side of the face, light and sound sensitivity. Working made it worse. Headaches/migraines can last days, going into a dark room helps, pulsating/pounding/throbbing, pressure in the face and jaw, positional worse with bending over, blurry vision. She is having the headaches every day. Shooting pain through the nose. Headaches are throbbing and all over, can be unilateral in the temples also in the back of the head. Never been on migraine medications in the past. She says her tics are in arms and shoulders, feels like it does it on its own, jerking movements, she makes vocalizations.   Reviewed notes, labs and imaging from outside physicians, which showed: see above, cbc and cmp unremarkable 10/13/2019  Review of Systems: Patient complains of symptoms per HPI as well as the following symptoms: headaches, fatigue, sore muscles, chest pain, abnormal sensations, anxiety. Pertinent negatives and positives per HPI. All others negative.   Social History   Socioeconomic History  . Marital status: Single    Spouse  name: Not on file  . Number of children: 1  . Years of education: Not on file  . Highest education level: Associate degree: occupational, Hotel manager, or vocational program  Occupational History  . Occupation: Scientist, water quality  Tobacco Use  . Smoking status: Never Smoker  . Smokeless tobacco: Never Used  Substance and Sexual Activity   . Alcohol use: Not Currently    Comment: occasionally  . Drug use: Not Currently    Types: Marijuana    Comment: tried marijuana once in an edible but it made her freak out.   Marland Kitchen Sexual activity: Yes    Birth control/protection: I.U.D.  Other Topics Concern  . Not on file  Social History Narrative   Lives with her mother   Right handed   Caffeine: none    Social Determinants of Health   Financial Resource Strain:   . Difficulty of Paying Living Expenses:   Food Insecurity:   . Worried About Charity fundraiser in the Last Year:   . Arboriculturist in the Last Year:   Transportation Needs:   . Film/video editor (Medical):   Marland Kitchen Lack of Transportation (Non-Medical):   Physical Activity:   . Days of Exercise per Week:   . Minutes of Exercise per Session:   Stress:   . Feeling of Stress :   Social Connections:   . Frequency of Communication with Friends and Family:   . Frequency of Social Gatherings with Friends and Family:   . Attends Religious Services:   . Active Member of Clubs or Organizations:   . Attends Archivist Meetings:   Marland Kitchen Marital Status:   Intimate Partner Violence:   . Fear of Current or Ex-Partner:   . Emotionally Abused:   Marland Kitchen Physically Abused:   . Sexually Abused:     Family History  Problem Relation Age of Onset  . Diabetes Father        MGM, maternal uncle  . Heart failure Father   . Prostate cancer Maternal Grandfather   . Breast cancer Maternal Aunt   . Healthy Mother   . Colon cancer Neg Hx   . Tics Neg Hx   . Migraines Neg Hx     Past Medical History:  Diagnosis Date  . Anxiety   . Asthma   . Gallstones   . Hyperlipemia     Patient Active Problem List   Diagnosis Date Noted  . Chronic migraine without aura with status migrainosus, not intractable 10/21/2019  . Generalized anxiety disorder 09/18/2019  . Tic disorder 09/18/2019  . Dry skin dermatitis 09/18/2019  . Dry heaves 09/18/2019  . Personal history of  covid-19 09/18/2019  . Obesity, Class III, BMI 40-49.9 (morbid obesity) ([place]) 07/07/2019  . Pneumonia due to COVID-19 virus 07/03/2019  . Acute respiratory failure with hypoxia ([place]) 07/03/2019  . Mild intermittent asthma 07/03/2019  . Routine general medical examination at a health care facility 01/18/2017    Past Surgical History:  Procedure Laterality Date  . CHOLECYSTECTOMY  feb 2006  . KNEE SURGERY     left    Current Outpatient Medications  Medication Sig Dispense Refill  . fluticasone (FLONASE) 50 MCG/ACT nasal spray Place 2 sprays into both nostrils daily. 9.9 mL 2  . hydrOXYzine (ATARAX/VISTARIL) 25 MG tablet Take 1 tablet (25 mg total) by mouth every 6 (six) hours as needed for anxiety. 60 tablet 1  . ketorolac (TORADOL) 10 MG tablet Take 1 tablet (10 mg total)  by mouth every 6 (six) hours as needed for moderate pain. 20 tablet 0  . levonorgestrel (MIRENA) 20 MCG/24HR IUD 1 each by Intrauterine route once.    . Multiple Vitamin (MULTIVITAMIN WITH MINERALS) TABS tablet Take 1 tablet by mouth daily.    . ondansetron (ZOFRAN) 4 MG tablet Take 1 tablet (4 mg total) by mouth daily as needed for nausea or vomiting. 30 tablet 1  . triamcinolone cream (KENALOG) 0.1 % Apply 1 application topically 2 (two) times daily as needed. 80 g 0  . Fremanezumab-vfrm (AJOVY) 225 MG/1.5ML SOAJ Inject 675 mg into the skin every 30 (thirty) days. 3 pen 0  . SUMAtriptan (IMITREX) 50 MG tablet Take 1 tablet (50 mg total) at the start of headache. May repeat 1 tablet (50 mg total) in 2 hours if headache persists or recurs. Maximum of 2 pills per 24 hours. (Patient not taking: Reported on 10/21/2019) 10 tablet 1  . topiramate (TOPAMAX) 50 MG tablet Take 1 tablet (50 mg total) by mouth at bedtime. 30 tablet 3   No current facility-administered medications for this visit.    Allergies as of 10/21/2019 - Review Complete 10/21/2019  Allergen Reaction Noted  . Sertraline  10/21/2019    Vitals: BP 109/67  (BP Location: Right Arm, Patient Position: Sitting, Cuff Size: Large)   Pulse 76   Temp 97.8 F (36.6 C) Comment: taken at front  Ht 5\' 9"  (1.753 m)   Wt 264 lb (119.7 kg)   BMI 38.99 kg/m  Last Weight:  Wt Readings from Last 1 Encounters:  10/21/19 264 lb (119.7 kg)   Last Height:   Ht Readings from Last 1 Encounters:  10/21/19 5\' 9"  (1.753 m)     Physical exam: Exam: Gen: NAD, conversant, well nourised, obese, well groomed                     CV: RRR, no MRG. No Carotid Bruits. No peripheral edema, warm, nontender Eyes: Conjunctivae clear without exudates or hemorrhage  Neuro: Detailed Neurologic Exam  Speech:    Speech is normal; fluent and spontaneous with normal comprehension.  Cognition:    The patient is oriented to person, place, and time;     recent and remote memory intact;     language fluent;     normal attention, concentration,     fund of knowledge  Cranial Nerves:    The pupils are equal, round, and reactive to light. The fundi are normal and spontaneous venous pulsations are present. Visual fields are full to finger confrontation. Extraocular movements are intact. Trigeminal sensation is intact and the muscles of mastication are normal. The face is symmetric. The palate elevates in the midline. Hearing intact. Voice is normal. Shoulder shrug is normal. The tongue has normal motion without fasciculations.   Coordination: no dysmteria     Gait:    Normal native gait  Motor Observation:    Random shrugging and some arm quick movements, throat clearing several times Tone:    Normal muscle tone.    Posture:    Posture is normal. normal erect    Strength:    Strength is V/V in the upper and lower limbs.      Sensation: intact to LT     Reflex Exam:  DTR's:    Deep tendon reflexes in the upper and lower extremities are normal bilaterally.   Toes:    The toes are downgoing bilaterally.   Clonus:    Clonus  is absent.    Assessment/Plan:  39  y.o. female here as requested by Marcine Matar, MD for tic disorder. PMHx anxiety, obesity. Also referred from the ED for migraines. We discussed due to limited time we will focus on migraines today but also address tics, anxiety.  TICS AND ANXIETY: -  It appears anxiety is significantly involved in her multiple somatic and neurologic complaints. I addressed tic disorder briefly but patient should really be seen in psychiatry for this disorder as they can provide habit reversal training, cognitive behavioral therapy or other therapy and psychiatry can also treat any precipitating disorders such as anxiety or related disorders like adhd,ocd, touretts's that often accompany tics. Today the tics did not appear severe.Refer to Ellis Savage, gave patient phone number and asked her to call. I also discussed with Ellis Savage.  MIGRAINES: Acute: She was started on imitrex, toradol and reglan (tics started prior to the reglan). She says it did not help but she is not sure if she took it correctly. Try it again, take imitrex right at the onet of migraine along with the toradol and regan.  Preventative: Topiramate. She has never been on any migraine preventative so we are limited on what insurance will pay for, try Topamax. Also there has been some small trials that Topamax may also help with tics although tetrabenazine is first line I will let psychiatry decide whether she needs this.  Topiramate needs time to work, and she is suffering and had been to the ED multiple times, I will inject her with 3 Ajovy injectiosn today, it is a great medication, it works quickly and has few side effects hopefully this will help bridge her.  Mri of the brain: MRI brain due to concerning symptoms of morning headaches, positional headaches,vision changes, abnormal movements, jerking movements, facial sensory changes to look for space occupying mass, chiari or intracranial hypertension (pseudotumor), multiple sclerosis.  -  follow up with pcp for possible sinus infection, she is on zyrtec and flonase now  Orders Placed This Encounter  Procedures  . MR BRAIN W WO CONTRAST  . TSH  . Ambulatory referral to Psychiatry   Meds ordered this encounter  Medications  . topiramate (TOPAMAX) 50 MG tablet    Sig: Take 1 tablet (50 mg total) by mouth at bedtime.    Dispense:  30 tablet    Refill:  3  . Fremanezumab-vfrm (AJOVY) 225 MG/1.5ML SOAJ    Sig: Inject 675 mg into the skin every 30 (thirty) days.    Dispense:  3 pen    Refill:  0    Cc: Marcine Matar, MD  Naomie Dean, MD  [facility] [address] [place], [place] [address]  Phone [phone] Fax [phone]

## 2019-10-21 ENCOUNTER — Other Ambulatory Visit: Payer: Self-pay

## 2019-10-21 ENCOUNTER — Encounter: Payer: Self-pay | Admitting: Neurology

## 2019-10-21 ENCOUNTER — Ambulatory Visit (INDEPENDENT_AMBULATORY_CARE_PROVIDER_SITE_OTHER): Payer: BLUE CROSS/BLUE SHIELD | Admitting: Neurology

## 2019-10-21 VITALS — BP 109/67 | HR 76 | Temp 97.8°F | Ht 69.0 in | Wt 264.0 lb

## 2019-10-21 DIAGNOSIS — H539 Unspecified visual disturbance: Secondary | ICD-10-CM

## 2019-10-21 DIAGNOSIS — R259 Unspecified abnormal involuntary movements: Secondary | ICD-10-CM | POA: Diagnosis not present

## 2019-10-21 DIAGNOSIS — R200 Anesthesia of skin: Secondary | ICD-10-CM

## 2019-10-21 DIAGNOSIS — G43701 Chronic migraine without aura, not intractable, with status migrainosus: Secondary | ICD-10-CM

## 2019-10-21 DIAGNOSIS — F419 Anxiety disorder, unspecified: Secondary | ICD-10-CM

## 2019-10-21 DIAGNOSIS — R51 Headache with orthostatic component, not elsewhere classified: Secondary | ICD-10-CM | POA: Diagnosis not present

## 2019-10-21 DIAGNOSIS — F959 Tic disorder, unspecified: Secondary | ICD-10-CM

## 2019-10-21 DIAGNOSIS — R519 Headache, unspecified: Secondary | ICD-10-CM

## 2019-10-21 DIAGNOSIS — R5383 Other fatigue: Secondary | ICD-10-CM

## 2019-10-21 MED ORDER — AJOVY 225 MG/1.5ML ~~LOC~~ SOAJ: 675.0000 mg | SUBCUTANEOUS | 0 refills | Status: DC

## 2019-10-21 MED ORDER — TOPIRAMATE 50 MG PO TABS: 50.0000 mg | ORAL_TABLET | Freq: Every day | ORAL | 3 refills | Status: DC

## 2019-10-21 NOTE — Patient Instructions (Addendum)
Topiramate at bedtime every night. For migraine prevention and also may help tics Ajovy for migraines you had 3 injections Acutely:  take imitrex right at the onet of migraine along with the toradol and regan. [facility] for tics and anxiety:  [facility] [address] Open ? Closes 5PM  [phone]  Fremanezumab injection What is this medicine? FREMANEZUMAB (fre ma NEZ ue mab) is used to prevent migraine headaches. This medicine may be used for other purposes; ask your health care provider or pharmacist if you have questions. COMMON BRAND NAME(S): AJOVY What should I tell my health care provider before I take this medicine? They need to know if you have any of these conditions:  an unusual or allergic reaction to fremanezumab, other medicines, foods, dyes, or preservatives  pregnant or trying to get pregnant  breast-feeding How should I use this medicine? This medicine is for injection under the skin. You will be taught how to prepare and give this medicine. Use exactly as directed. Take your medicine at regular intervals. Do not take your medicine more often than directed. It is important that you put your used needles and syringes in a special sharps container. Do not put them in a trash can. If you do not have a sharps container, call your pharmacist or healthcare provider to get one. Talk to your pediatrician regarding the use of this medicine in children. Special care may be needed. Overdosage: If you think you have taken too much of this medicine contact a poison control center or emergency room at once. NOTE: This medicine is only for you. Do not share this medicine with others. What if I miss a dose? If you miss a dose, take it as soon as you can. If it is almost time for your next dose, take only that dose. Do not take double or extra doses. What may interact with this medicine? Interactions are not expected. This  list may not describe all possible interactions. Give your health care provider a list of all the medicines, herbs, non-prescription drugs, or dietary supplements you use. Also tell them if you smoke, drink alcohol, or use illegal drugs. Some items may interact with your medicine. What should I watch for while using this medicine? Tell your doctor or healthcare professional if your symptoms do not start to get better or if they get worse. What side effects may I notice from receiving this medicine? Side effects that you should report to your doctor or health care professional as soon as possible:  allergic reactions like skin rash, itching or hives, swelling of the face, lips, or tongue Side effects that usually do not require medical attention (report these to your doctor or health care professional if they continue or are bothersome):  pain, redness, or irritation at site where injected This list may not describe all possible side effects. Call your doctor for medical advice about side effects. You may report side effects to FDA at 1-800-FDA-1088. Where should I keep my medicine? Keep out of the reach of children. You will be instructed on how to store this medicine. Throw away any unused medicine after the expiration date on the label. NOTE: This sheet is a summary. It may not cover all possible information. If you have questions about this medicine, talk to your doctor, pharmacist, or health care provider.  2020 Elsevier/Gold Standard (2017-04-02 17:22:56) Topiramate tablets What is this medicine? TOPIRAMATE (toe PYRE a mate) is used to  treat seizures in adults or children with epilepsy. It is also used for the prevention of migraine headaches. This medicine may be used for other purposes; ask your health care provider or pharmacist if you have questions. COMMON BRAND NAME(S): Topamax, Topiragen What should I tell my health care provider before I take this medicine? They need to know if  you have any of these conditions:  bleeding disorders  kidney disease  lung or breathing disease, like asthma  suicidal thoughts, plans, or attempt; a previous suicide attempt by you or a family member  an unusual or allergic reaction to topiramate, other medicines, foods, dyes, or preservatives  pregnant or trying to get pregnant  breast-feeding How should I use this medicine? Take this medicine by mouth with a glass of water. Follow the directions on the prescription label. Do not cut, crush or chew this medicine. Swallow the tablets whole. You can take it with or without food. If it upsets your stomach, take it with food. Take your medicine at regular intervals. Do not take it more often than directed. Do not stop taking except on your doctor's advice. A special MedGuide will be given to you by the pharmacist with each prescription and refill. Be sure to read this information carefully each time. Talk to your pediatrician regarding the use of this medicine in children. While this drug may be prescribed for children as young as 54 years of age for selected conditions, precautions do apply. Overdosage: If you think you have taken too much of this medicine contact a poison control center or emergency room at once. NOTE: This medicine is only for you. Do not share this medicine with others. What if I miss a dose? If you miss a dose, take it as soon as you can. If your next dose is to be taken in less than 6 hours, then do not take the missed dose. Take the next dose at your regular time. Do not take double or extra doses. What may interact with this medicine? This medicine may interact with the following medications:  acetazolamide  alcohol  antihistamines for allergy, cough, and cold  aspirin and aspirin-like medicines  atropine  birth control pills  certain medicines for anxiety or sleep  certain medicines for bladder problems like oxybutynin, tolterodine  certain medicines  for depression like amitriptyline, fluoxetine, sertraline  certain medicines for seizures like carbamazepine, phenobarbital, phenytoin, primidone, valproic acid, zonisamide  certain medicines for stomach problems like dicyclomine, hyoscyamine  certain medicines for travel sickness like scopolamine  certain medicines for Parkinson's disease like benztropine, trihexyphenidyl  certain medicines that treat or prevent blood clots like warfarin, enoxaparin, dalteparin, apixaban, dabigatran, and rivaroxaban  digoxin  general anesthetics like halothane, isoflurane, methoxyflurane, propofol  hydrochlorothiazide  ipratropium  lithium  medicines that relax muscles for surgery  metformin  narcotic medicines for pain  NSAIDs, medicines for pain and inflammation, like ibuprofen or naproxen  phenothiazines like chlorpromazine, mesoridazine, prochlorperazine, thioridazine  pioglitazone This list may not describe all possible interactions. Give your health care provider a list of all the medicines, herbs, non-prescription drugs, or dietary supplements you use. Also tell them if you smoke, drink alcohol, or use illegal drugs. Some items may interact with your medicine. What should I watch for while using this medicine? Visit your doctor or health care professional for regular checks on your progress. Tell your health care professional if your symptoms do not start to get better or if they get worse. Do not stop taking  except on your health care professional's advice. You may develop a severe reaction. Your health care professional will tell you how much medicine to take. Wear a medical ID bracelet or chain. Carry a card that describes your disease and details of your medicine and dosage times. This medicine can reduce the response of your body to heat or cold. Dress warm in cold weather and stay hydrated in hot weather. If possible, avoid extreme temperatures like saunas, hot tubs, very hot or  cold showers, or activities that can cause dehydration such as vigorous exercise. Check with your health care professional if you have severe diarrhea, nausea, and vomiting, or if you sweat a lot. The loss of too much body fluid may make it dangerous for you to take this medicine. You may get drowsy or dizzy. Do not drive, use machinery, or do anything that needs mental alertness until you know how this medicine affects you. Do not stand up or sit up quickly, especially if you are an older patient. This reduces the risk of dizzy or fainting spells. Alcohol may interfere with the effect of this medicine. Avoid alcoholic drinks. Tell your health care professional right away if you have any change in your eyesight. Patients and their families should watch out for new or worsening depression or thoughts of suicide. Also watch out for sudden changes in feelings such as feeling anxious, agitated, panicky, irritable, hostile, aggressive, impulsive, severely restless, overly excited and hyperactive, or not being able to sleep. If this happens, especially at the beginning of treatment or after a change in dose, call your healthcare professional. This medicine may cause serious skin reactions. They can happen weeks to months after starting the medicine. Contact your health care provider right away if you notice fevers or flu-like symptoms with a rash. The rash may be red or purple and then turn into blisters or peeling of the skin. Or, you might notice a red rash with swelling of the face, lips or lymph nodes in your neck or under your arms. Birth control may not work properly while you are taking this medicine. Talk to your health care professional about using an extra method of birth control. Women should inform their health care professional if they wish to become pregnant or think they might be pregnant. There is a potential for serious side effects and harm to an unborn child. Talk to your health care professional  for more information. What side effects may I notice from receiving this medicine? Side effects that you should report to your doctor or health care professional as soon as possible:  allergic reactions like skin rash, itching or hives, swelling of the face, lips, or tongue  blood in the urine  changes in vision  confusion  loss of memory  pain in lower back or side  pain when urinating  redness, blistering, peeling or loosening of the skin, including inside the mouth  signs and symptoms of bleeding such as bloody or black, tarry stools; red or dark brown urine; spitting up blood or brown material that looks like coffee grounds; red spots on the skin; unusual bruising or bleeding from the eyes, gums, or nose  signs and symptoms of increased acid in the body like breathing fast; fast heartbeat; headache; confusion; unusually weak or tired; nausea, vomiting  suicidal thoughts, mood changes  trouble speaking or understanding  unusual sweating  unusually weak or tired Side effects that usually do not require medical attention (report to your doctor or health  care professional if they continue or are bothersome):  dizziness  drowsiness  fever  loss of appetite  nausea, vomiting  pain, tingling, numbness in the hands or feet  stomach pain  tiredness  upset stomach This list may not describe all possible side effects. Call your doctor for medical advice about side effects. You may report side effects to FDA at 1-800-FDA-1088. Where should I keep my medicine? Keep out of the reach of children. Store at room temperature between 15 and 30 degrees C (59 and 86 degrees F). Throw away any unused medicine after the expiration date. NOTE: This sheet is a summary. It may not cover all possible information. If you have questions about this medicine, talk to your doctor, pharmacist, or health care provider.  2020 Elsevier/Gold Standard (2019-01-30 15:07:20)  Tic Disorders A  tic disorder is a condition in which a person makes sudden and repeated movements or sounds (tics). There are three types of tic disorders:  Transient or provisional tic disorder (common). This type usually goes away within a year or two.  Chronic or persistent tic disorder. This type may last all through childhood and continue into the adult years.  Tourette syndrome (rare). This type lasts through all of life. It often occurs with other disorders. Tic disorders starts before age 74, usually between age of 45 and 37. These disorders cannot be cured, but there are many treatments that can help manage tics. Most tic disorders get better over time. What are the causes? The cause of this condition is not known. What are the signs or symptoms? The main symptom of this condition is experiencing tics. There are four type of tics:  Simple motor tics. These are movements in one area of the body.  Complex motor tics. These are movements in large areas or in several areas of the body.  Simple vocal tics. These are single sounds.  Complex vocal tics. These are sounds that include several words or phrases. Tics range in severity and may be more severe when you are stressed or tired. Tics can change over time. Symptoms of simple motor tics  Blinking, squinting, or eyebrow raising.  Nose wrinkling.  Mouth twitching, grimacing, or making tongue movements.  Head nodding or twisting.  Shoulder shrugging.  Arm jerking.  Foot shaking. Symptoms of complex motor tics  Grooming behavior, such as combing one's hair.  Smelling objects.  Jumping.  Imitating others' behavior.  Making rude or obscene gestures. Symptoms of simple vocal tics  Coughing.  Humming.  Throat clearing.  Grunting.  Yawning.  Sniffing.  Barking.  Snorting. Symptoms of complex vocal tics  Imitating what others say.  Saying words and sentences that may: ? Seem out of context. ? Be rude. How is this  diagnosed? This condition is diagnosed based on:  Your symptoms.  Your medical history.  A physical exam.  An exam of your nervous system (neurological exam).  Tests. These may be done to rule out other conditions that cause symptoms like tics. Tests may include: ? Blood tests. ? Brain imaging tests. Your health care provider will ask you about:  The type of tics you have.  When the tics started and how often they happen.  How the tics affect your daily activities.  Other medical issues you may have.  Whether you take over-the-counter or prescription medicines.  Whether you use any drugs. You may be referred to a brain and nerve specialist (neurologist) or a mental health specialist for further evaluation. How  is this treated? Treatment for this condition depends on how severe your tics are. If they are mild, you may not need treatment. If they are more severe, you may benefit from treatment. Some treatments include:  Cognitive behavioral therapy. This kind of therapy involves talking to a mental health professional. The therapist can help you to: ? Become more aware of your tics. ? Learn ways to control your tics. ? Know how to disguise your tics.  Family therapy. This kind of therapy provides education and emotional support for your family members.  Medicine that helps to control tics.  Medicine that is injected into the body to relax muscles (botulinum toxin). This may be a treatment option if your tics are severe.  Electrical stimulation of the brain (deep brain stimulation). This may be a treatment option if your tics are severe. Follow these instructions at home:  Take over-the-counter and prescription medicines only as told by your health care provider.  Check with your health care provider before using any new prescription or over-the-counter medicines.  Keep all follow-up visits as told by your health care provider. This is important. Contact a health care  provider if:  You are not able to take your medicines as prescribed.  Your symptoms get worse.  Your symptoms are interfering with your ability to function normally at home, work, or school.  You have new or unusual symptoms like pain or weakness.  Your symptoms make you feel depressed or anxious. Summary  A tic disorder is a condition in which a person makes sudden and repeated movements or sounds.  Tic disorders start before age 62, usually between the age of 42 and 82.  Many tic disorders are mild and do not need treatment.  These disorders cannot be cured, but there are many treatments that can help manage tics. This information is not intended to replace advice given to you by your health care provider. Make sure you discuss any questions you have with your health care provider. Document Revised: 06/15/2017 Document Reviewed: 07/21/2016 Elsevier Patient Education  2020 [employer].  Migraine Headache A migraine headache is an intense, throbbing pain on one side or both sides of the head. Migraine headaches may also cause other symptoms, such as nausea, vomiting, and sensitivity to light and noise. A migraine headache can last from 4 hours to 3 days. Talk with your doctor about what things may bring on (trigger) your migraine headaches. What are the causes? The exact cause of this condition is not known. However, a migraine may be caused when nerves in the brain become irritated and release chemicals that cause inflammation of blood vessels. This inflammation causes pain. This condition may be triggered or caused by:  Drinking alcohol.  Smoking.  Taking medicines, such as: ? Medicine used to treat chest pain (nitroglycerin). ? Birth control pills. ? Estrogen. ? Certain blood pressure medicines.  Eating or drinking products that contain nitrates, glutamate, aspartame, or tyramine. Aged cheeses, chocolate, or caffeine may also be triggers.  Doing physical activity. Other  things that may trigger a migraine headache include:  Menstruation.  Pregnancy.  Hunger.  Stress.  Lack of sleep or too much sleep.  Weather changes.  Fatigue. What increases the risk? The following factors may make you more likely to experience migraine headaches:  Being a certain age. This condition is more common in people who are 74-8 years old.  Being female.  Having a family history of migraine headaches.  Being Caucasian.  Having a  mental health condition, such as depression or anxiety.  Being obese. What are the signs or symptoms? The main symptom of this condition is pulsating or throbbing pain. This pain may:  Happen in any area of the head, such as on one side or both sides.  Interfere with daily activities.  Get worse with physical activity.  Get worse with exposure to bright lights or loud noises. Other symptoms may include:  Nausea.  Vomiting.  Dizziness.  General sensitivity to bright lights, loud noises, or smells. Before you get a migraine headache, you may get warning signs (an aura). An aura may include:  Seeing flashing lights or having blind spots.  Seeing bright spots, halos, or zigzag lines.  Having tunnel vision or blurred vision.  Having numbness or a tingling feeling.  Having trouble talking.  Having muscle weakness. Some people have symptoms after a migraine headache (postdromal phase), such as:  Feeling tired.  Difficulty concentrating. How is this diagnosed? A migraine headache can be diagnosed based on:  Your symptoms.  A physical exam.  Tests, such as: ? CT scan or an MRI of the head. These imaging tests can help rule out other causes of headaches. ? Taking fluid from the spine (lumbar puncture) and analyzing it (cerebrospinal fluid analysis, or CSF analysis). How is this treated? This condition may be treated with medicines that:  Relieve pain.  Relieve nausea.  Prevent migraine headaches. Treatment  for this condition may also include:  Acupuncture.  Lifestyle changes like avoiding foods that trigger migraine headaches.  Biofeedback.  Cognitive behavioral therapy. Follow these instructions at home: Medicines  Take over-the-counter and prescription medicines only as told by your health care provider.  Ask your health care provider if the medicine prescribed to you: ? Requires you to avoid driving or using heavy machinery. ? Can cause constipation. You may need to take these actions to prevent or treat constipation:  Drink enough fluid to keep your urine pale yellow.  Take over-the-counter or prescription medicines.  Eat foods that are high in fiber, such as beans, whole grains, and fresh fruits and vegetables.  Limit foods that are high in fat and processed sugars, such as fried or sweet foods. Lifestyle  Do not drink alcohol.  Do not use any products that contain nicotine or tobacco, such as cigarettes, e-cigarettes, and chewing tobacco. If you need help quitting, ask your health care provider.  Get at least 8 hours of sleep every night.  Find ways to manage stress, such as meditation, deep breathing, or yoga. General instructions      Keep a journal to find out what may trigger your migraine headaches. For example, write down: ? What you eat and drink. ? How much sleep you get. ? Any change to your diet or medicines.  If you have a migraine headache: ? Avoid things that make your symptoms worse, such as bright lights. ? It may help to lie down in a dark, quiet room. ? Do not drive or use heavy machinery. ? Ask your health care provider what activities are safe for you while you are experiencing symptoms.  Keep all follow-up visits as told by your health care provider. This is important. Contact a health care provider if:  You develop symptoms that are different or more severe than your usual migraine headache symptoms.  You have more than 15 headache days  in one month. Get help right away if:  Your migraine headache becomes severe.  Your migraine headache lasts  longer than 72 hours.  You have a fever.  You have a stiff neck.  You have vision loss.  Your muscles feel weak or like you cannot control them.  You start to lose your balance often.  You have trouble walking.  You faint.  You have a seizure. Summary  A migraine headache is an intense, throbbing pain on one side or both sides of the head. Migraines may also cause other symptoms, such as nausea, vomiting, and sensitivity to light and noise.  This condition may be treated with medicines and lifestyle changes. You may also need to avoid certain things that trigger a migraine headache.  Keep a journal to find out what may trigger your migraine headaches.  Contact your health care provider if you have more than 15 headache days in a month or you develop symptoms that are different or more severe than your usual migraine headache symptoms. This information is not intended to replace advice given to you by your health care provider. Make sure you discuss any questions you have with your health care provider. Document Revised: 10/25/2018 Document Reviewed: 08/15/2018 Elsevier Patient Education  2020 [employer].

## 2019-10-22 ENCOUNTER — Encounter (HOSPITAL_COMMUNITY): Payer: Self-pay | Admitting: Emergency Medicine

## 2019-10-22 ENCOUNTER — Other Ambulatory Visit: Payer: Self-pay

## 2019-10-22 ENCOUNTER — Emergency Department (HOSPITAL_COMMUNITY)
Admission: EM | Admit: 2019-10-22 | Discharge: 2019-10-23 | Disposition: A | Discharge status: Home / Self Care | Payer: BLUE CROSS/BLUE SHIELD | Source: Home / Self Care | Attending: Emergency Medicine | Admitting: Emergency Medicine

## 2019-10-22 ENCOUNTER — Emergency Department (HOSPITAL_COMMUNITY)
Admission: EM | Admit: 2019-10-22 | Discharge: 2019-10-22 | Discharge status: Against Medical Advice | Payer: BLUE CROSS/BLUE SHIELD | Attending: Emergency Medicine | Admitting: Emergency Medicine

## 2019-10-22 ENCOUNTER — Encounter: Payer: Self-pay | Admitting: Internal Medicine

## 2019-10-22 DIAGNOSIS — R102 Pelvic and perineal pain: Secondary | ICD-10-CM | POA: Insufficient documentation

## 2019-10-22 DIAGNOSIS — B3731 Acute candidiasis of vulva and vagina: Secondary | ICD-10-CM

## 2019-10-22 DIAGNOSIS — L0231 Cutaneous abscess of buttock: Secondary | ICD-10-CM

## 2019-10-22 DIAGNOSIS — B373 Candidiasis of vulva and vagina: Secondary | ICD-10-CM

## 2019-10-22 DIAGNOSIS — J45909 Unspecified asthma, uncomplicated: Secondary | ICD-10-CM | POA: Diagnosis not present

## 2019-10-22 DIAGNOSIS — N3000 Acute cystitis without hematuria: Secondary | ICD-10-CM

## 2019-10-22 DIAGNOSIS — M62838 Other muscle spasm: Secondary | ICD-10-CM

## 2019-10-22 LAB — URINALYSIS, ROUTINE W REFLEX MICROSCOPIC
Bilirubin Urine: NEGATIVE
Bilirubin Urine: NEGATIVE
Glucose, UA: NEGATIVE mg/dL
Glucose, UA: NEGATIVE mg/dL
Ketones, ur: NEGATIVE mg/dL
Ketones, ur: NEGATIVE mg/dL
Nitrite: NEGATIVE
Nitrite: NEGATIVE
Protein, ur: 100 mg/dL — AB
Protein, ur: 30 mg/dL — AB
Specific Gravity, Urine: 1.017 (ref 1.005–1.030)
Specific Gravity, Urine: 1.023 (ref 1.005–1.030)
pH: 6 (ref 5.0–8.0)
pH: 6 (ref 5.0–8.0)

## 2019-10-22 LAB — COMPREHENSIVE METABOLIC PANEL
ALT: 16 U/L (ref 0–44)
AST: 14 U/L — ABNORMAL LOW (ref 15–41)
Albumin: 3.8 g/dL (ref 3.5–5.0)
Alkaline Phosphatase: 62 U/L (ref 38–126)
Anion gap: 7 (ref 5–15)
BUN: 8 mg/dL (ref 6–20)
CO2: 28 mmol/L (ref 22–32)
Calcium: 8.9 mg/dL (ref 8.9–10.3)
Chloride: 105 mmol/L (ref 98–111)
Creatinine, Ser: 0.86 mg/dL (ref 0.44–1.00)
GFR calc Af Amer: >60 mL/min (ref >60)
GFR calc non Af Amer: >60 mL/min (ref >60)
Glucose, Bld: 109 mg/dL — ABNORMAL HIGH (ref 70–99)
Potassium: 4 mmol/L (ref 3.5–5.1)
Sodium: 140 mmol/L (ref 135–145)
Total Bilirubin: 0.8 mg/dL (ref 0.3–1.2)
Total Protein: 7.5 g/dL (ref 6.5–8.1)

## 2019-10-22 LAB — CBC
HCT: 40.3 % (ref 36.0–46.0)
Hemoglobin: 12.7 g/dL (ref 12.0–15.0)
MCH: 27.6 pg (ref 26.0–34.0)
MCHC: 31.5 g/dL (ref 30.0–36.0)
MCV: 87.6 fL (ref 80.0–100.0)
Platelets: 306 10*3/uL (ref 150–400)
RBC: 4.6 MIL/uL (ref 3.87–5.11)
RDW: 12.5 % (ref 11.5–15.5)
WBC: 8.1 10*3/uL (ref 4.0–10.5)
nRBC: 0 % (ref 0.0–0.2)

## 2019-10-22 LAB — I-STAT BETA HCG BLOOD, ED (MC, WL, AP ONLY)
I-stat hCG, quantitative: <5 m[IU]/mL (ref <5)
I-stat hCG, quantitative: <5 m[IU]/mL (ref <5)

## 2019-10-22 LAB — TSH: TSH: 0.845 u[IU]/mL (ref 0.450–4.500)

## 2019-10-22 LAB — LIPASE, BLOOD: Lipase: 28 U/L (ref 11–51)

## 2019-10-22 MED ORDER — DIAZEPAM 5 MG/ML IJ SOLN
5.0000 mg | Freq: Once | INTRAMUSCULAR | Status: AC
  Administered 2019-10-23: 5 mg via INTRAVENOUS
  Filled 2019-10-22: qty 2

## 2019-10-22 MED ORDER — SODIUM CHLORIDE 0.9% FLUSH: 3.0000 mL | Freq: Once | INTRAVENOUS | Status: DC

## 2019-10-22 NOTE — ED Provider Notes (Signed)
[facility]-EMERGENCY DEPT Provider Note   CSN: [id number] Arrival date & time: 10/22/19  2046     History Chief Complaint  Patient presents with  . Abdominal Pain    Amy Mendoza is a 39 y.o. female with history of obesity, asthma, generalized anxiety disorder, and panic attacks who presents to the emergency department with a chief complaint of pelvic pain.  The patient reports that she noticed some left-sided lower abdominal and pelvic pain that felt like pressure 2 days ago.  She reports that earlier today she was sitting down when she suddenly felt sharp, shooting pain in her left lower abdomen that radiated upward toward her chest.  She reports that she became very anxious after the pain began and had a panic attack.  Since the onset of pain, she has developed cramping and spasms that are visible on the abdominal wall.  Spasms are intermittent, but will happen once every couple of minutes.  She reports intense pain accompanying the spasms.   She also notes that she developed numbness in her left lower leg over the last few days.  She feels as if her left leg is asleep.  No symptoms in her right leg or arms.  She has had no urinary or fecal incontinence.  No dysuria, urinary frequency or hesitancy, nausea, vomiting, diarrhea, fever, or chills.  She has been feeling constipated.  She also notes that she developed a boil on her groin about a week ago.  She reports that it "popped" 3 days ago and she has had copious amounts of bloody drainage.  She reports that she has noticed multiple blood clots draining from the area.  Reports the drainage has been heavy enough that it drained down her leg.  She was seen yesterday by Dr. Daisy Blossom with neurology where she reported that she had been having body twitching that seems to come in waves since early January.  Reportedly had tics in her arms and shoulders that were uncontrollable and felt like jerking movements.  Per chart  review, she has had headaches associated with her menstrual cycles for many years, but after having Covid in January she started having significant headaches and pressure in the head and face.  She has been seen for headaches in the ER multiple times since onset.  She was started on Topamax, but has not yet taken her first dose of the medication.  She received 3 injections of Ajovy in the office.  Reports that she had an IUD placed in August 2020 by Dr. Shawnie Pons in [facility].  She was having regular menstrual cycles until February 2020 and her menses has since subsided.   The history is provided by the patient and medical records. No language interpreter was used.       Past Medical History:  Diagnosis Date  . Anxiety   . Asthma   . Gallstones   . Hyperlipemia     Patient Active Problem List   Diagnosis Date Noted  . Chronic migraine without aura with status migrainosus, not intractable 10/21/2019  . Generalized anxiety disorder 09/18/2019  . Tic disorder 09/18/2019  . Dry skin dermatitis 09/18/2019  . Dry heaves 09/18/2019  . Personal history of covid-19 09/18/2019  . Obesity, Class III, BMI 40-49.9 (morbid obesity) (HCC) 07/07/2019  . Pneumonia due to COVID-19 virus 07/03/2019  . Acute respiratory failure with hypoxia (HCC) 07/03/2019  . Mild intermittent asthma 07/03/2019  . Routine general medical examination at a health care facility 01/18/2017  Past Surgical History:  Procedure Laterality Date  . CHOLECYSTECTOMY  feb 2006  . KNEE SURGERY     left     OB History   No obstetric history on file.     Family History  Problem Relation Age of Onset  . Diabetes Father        MGM, maternal uncle  . Heart failure Father   . Prostate cancer Maternal Grandfather   . Breast cancer Maternal Aunt   . Healthy Mother   . Colon cancer Neg Hx   . Tics Neg Hx   . Migraines Neg Hx     Social History   Tobacco Use  . Smoking status: Never Smoker  . Smokeless tobacco:  Never Used  Substance Use Topics  . Alcohol use: Not Currently    Comment: occasionally  . Drug use: Not Currently    Types: Marijuana    Comment: tried marijuana once in an edible but it made her freak out.     Home Medications Prior to Admission medications   Medication Sig Start Date End Date Taking? Authorizing Provider  fluticasone (FLONASE) 50 MCG/ACT nasal spray Place 2 sprays into both nostrils daily. 10/19/19   Chilton Si, Sharion Settler, PA-C  Fremanezumab-vfrm (AJOVY) 225 MG/1.5ML SOAJ Inject 675 mg into the skin every 30 (thirty) days. 10/21/19   Anson Fret, MD  hydrOXYzine (ATARAX/VISTARIL) 25 MG tablet Take 1 tablet (25 mg total) by mouth every 6 (six) hours as needed for anxiety. 09/18/19   Marcine Matar, MD  ketorolac (TORADOL) 10 MG tablet Take 1 tablet (10 mg total) by mouth every 6 (six) hours as needed for moderate pain. 10/15/19   Long, Arlyss Repress, MD  levonorgestrel (MIRENA) 20 MCG/24HR IUD 1 each by Intrauterine route once.    [provider]  Multiple Vitamin (MULTIVITAMIN WITH MINERALS) TABS tablet Take 1 tablet by mouth daily.    [provider]  ondansetron (ZOFRAN) 4 MG tablet Take 1 tablet (4 mg total) by mouth daily as needed for nausea or vomiting. 09/18/19   Marcine Matar, MD  SUMAtriptan (IMITREX) 50 MG tablet Take 1 tablet (50 mg total) at the start of headache. May repeat 1 tablet (50 mg total) in 2 hours if headache persists or recurs. Maximum of 2 pills per 24 hours. Patient not taking: Reported on 10/21/2019 10/20/19   Rema Fendt, NP  topiramate (TOPAMAX) 50 MG tablet Take 1 tablet (50 mg total) by mouth at bedtime. 10/21/19   Anson Fret, MD  triamcinolone cream (KENALOG) 0.1 % Apply 1 application topically 2 (two) times daily as needed. 09/18/19   Marcine Matar, MD    Allergies    Sertraline  Review of Systems   Review of Systems  Constitutional: Negative for activity change, chills and fever.  Eyes: Negative for visual  disturbance.  Respiratory: Negative for shortness of breath.   Cardiovascular: Negative for chest pain.  Gastrointestinal: Positive for abdominal pain. Negative for blood in stool, constipation, diarrhea, nausea and vomiting.  Genitourinary: Negative for dysuria, flank pain, frequency, genital sores and pelvic pain.  Musculoskeletal: Negative for back pain.  Skin: Negative for rash.  Allergic/Immunologic: Negative for immunocompromised state.  Neurological: Negative for syncope, speech difficulty, weakness, light-headedness, numbness and headaches.       Involuntary twitching  Hematological: Does not bruise/bleed easily.  Psychiatric/Behavioral: Negative for confusion, dysphoric mood, hallucinations, self-injury and suicidal ideas. The patient is not nervous/anxious.     Physical Exam Updated  Vital Signs BP (!) 144/102 (BP Location: Right Arm)   Pulse (!) 106   Temp 97.8 F (36.6 C) (Oral)   Resp 20   Ht 5\' 9"  (1.753 m)   Wt 119.7 kg   SpO2 99%   BMI 38.99 kg/m   Physical Exam Vitals and nursing note reviewed.  Constitutional:      General: She is not in acute distress.    Appearance: She is obese.  HENT:     Head: Normocephalic.  Eyes:     Conjunctiva/sclera: Conjunctivae normal.  Cardiovascular:     Rate and Rhythm: Normal rate and regular rhythm.     Heart sounds: No murmur. No friction rub. No gallop.   Pulmonary:     Effort: Pulmonary effort is normal. No respiratory distress.     Breath sounds: No stridor. No wheezing, rhonchi or rales.  Chest:     Chest wall: No tenderness.  Abdominal:     General: There is no distension.     Palpations: Abdomen is soft. There is no mass.     Tenderness: There is abdominal tenderness. There is no right CVA tenderness, left CVA tenderness, guarding or rebound.     Hernia: No hernia is present.     Comments: Involuntary spasms noted throughout the abdominal wall.  She cries out in pain with onset of spasms.  Spasms will last for  less than 5 seconds before resolving spontaneously.  She has episodes approximately every 30 seconds to a minute while I am in the room examining her.  Tender to palpation in the left pelvic region.  No rebound or guarding.  Abdomen is soft and nondistended.  Abdomen is otherwise soft and nontender.  Normoactive bowel sounds.  Genitourinary:      Comments: Chaperoned exam.  IUD strings are visualized.  There is dark brown blood in the vaginal vault, a moderate amount.  No wounds noted to the vaginal wall.  No adnexal tenderness or masses bilaterally.  No cervical motion tenderness.  There is a 1 cm abscess to the left buttock.  There is no fluctuance or surrounding induration, warmth, erythema, red streaking.  Wound is already open, but there is scant bloody drainage.  No surrounding tenderness to the abscess. Musculoskeletal:     Cervical back: Neck supple.  Skin:    General: Skin is warm.     Findings: No rash.  Neurological:     Mental Status: She is alert.     Comments: Cranial nerves II through XII are grossly intact.  5 of 5 strength against resistance of the bilateral upper and lower extremities.  Sensation is intact and equal throughout.  No ataxia with ambulation.  Psychiatric:        Behavior: Behavior normal.     ED Results / Procedures / Treatments   Labs (all labs ordered are listed, but only abnormal results are displayed) Labs Reviewed  COMPREHENSIVE METABOLIC PANEL - Abnormal; Notable for the following components:      Result Value   Glucose, Bld 109 (*)    AST 14 (*)    All other components within normal limits  LIPASE, BLOOD  CBC  URINALYSIS, ROUTINE W REFLEX MICROSCOPIC  I-STAT BETA HCG BLOOD, ED ([facility], [facility], [facility] ONLY)    EKG None  Radiology No results found.  Procedures Procedures (including critical care time)  Medications Ordered in ED Medications  sodium chloride flush (NS) 0.9 % injection 3 mL (0 mLs Intravenous Hold 10/22/19 2231)  ED Course  I  have reviewed the triage vital signs and the nursing notes.  Pertinent labs & imaging results that were available during my care of the patient were reviewed by me and considered in my medical decision making (see chart for details).    MDM Rules/Calculators/A&P                      39 year old female with history of obesity, asthma, generalized anxiety disorder, and panic attacks presenting with spasming to the abdominal wall and left lower quadrant pain.  She also has had a draining wound on her left buttock for the last few days.  No constitutional symptoms.  No nausea, vomiting, or diarrhea.  On exam, she has visible spasms to the abdominal wall.  Per chart review, she was previously seen by neurology after having tics to the upper extremities and shoulders for the last few months.  She has been having chronic headaches for the last few months.  She has an upcoming MRI.  Valium was given in the ER with significant improvement in spasming.  On exam, she did have some left-sided pelvic pain.  She notes a history of ovarian cysts.  Pelvic exam did not demonstrate any adnexal tenderness or masses.  Wet prep demonstrated yeast so I will treat her with Diflucan for vaginal candidiasis.  Urinalysis with moderate leukocyte esterase.  Urine culture has been sent.  Will prophylactically treat for UTI with Keflex, which should also cover the draining abscess on her left buttock.  There is no signs of surrounding cellulitis on left buttock wound.  Regarding her abdominal pain, her labs look otherwise unremarkable.  I have a low suspicion for intra-abdominal infection.  Specifically, doubt PID or ovarian torsion.  She was endorsing some decreased sensation to her left lower extremity, but on my evaluation, she was neurovascularly intact.  She already has an upcoming MRI and is established with neurology.  She does not warrant an emergent MRI today given her exam.  On reevaluation, after Valium and Toradol, she is  much improved.  The plan was discussed.  All questions answered.  She is hemodynamically stable to no acute distress.  Safe for discharge home with outpatient follow-up as indicated.  Final Clinical Impression(s) / ED Diagnoses Final diagnoses:  None    Rx / DC Orders ED Discharge Orders    None       Walden Statz A, PA-C 10/23/19 [id number]    Derwood Kaplan, MD 10/28/19 1651

## 2019-10-22 NOTE — ED Triage Notes (Signed)
Patient is complaining of pressure in her abdominal area. Patient states it started two days ago.

## 2019-10-22 NOTE — ED Triage Notes (Signed)
  Patient comes in with lower abdominal pain and vaginal discharge.  Patient states she thinks her IUD is pushing through her uterus.  States the pain has been going on for a couple days but got worse tonight and had a brown discharge.  Patient states she wants the IUD removed and is in a lot of pain.  Pain 10/10

## 2019-10-23 ENCOUNTER — Telehealth: Payer: Self-pay | Admitting: Neurology

## 2019-10-23 LAB — GC/CHLAMYDIA PROBE AMP (~~LOC~~) NOT AT ARMC
Chlamydia: NEGATIVE
Comment: NEGATIVE
Comment: NORMAL
Neisseria Gonorrhea: NEGATIVE

## 2019-10-23 LAB — WET PREP, GENITAL
Clue Cells Wet Prep HPF POC: NONE SEEN
Sperm: NONE SEEN
Trich, Wet Prep: NONE SEEN

## 2019-10-23 MED ORDER — CEPHALEXIN 500 MG PO CAPS: 500.0000 mg | ORAL_CAPSULE | Freq: Four times a day (QID) | ORAL | 0 refills | Status: DC

## 2019-10-23 MED ORDER — FLUCONAZOLE 200 MG PO TABS: ORAL_TABLET | ORAL | 0 refills | Status: DC

## 2019-10-23 MED ORDER — METHOCARBAMOL 500 MG PO TABS: 500.0000 mg | ORAL_TABLET | Freq: Two times a day (BID) | ORAL | 0 refills | Status: DC

## 2019-10-23 MED ORDER — KETOROLAC TROMETHAMINE 30 MG/ML IJ SOLN
30.0000 mg | Freq: Once | INTRAMUSCULAR | Status: AC
  Administered 2019-10-23: 02:00:00 30 mg via INTRAVENOUS
  Filled 2019-10-23: qty 1

## 2019-10-23 NOTE — Discharge Instructions (Signed)
Thank you for allowing me to care for you today in the Emergency Department.   Your pelvic exam tested positive for yeast.  Take 1 tablet of Diflucan by mouth now and the second tablet after you finish the course of Keflex.  Keflex is an antibiotic.  It can treat a UTI as well as the wound on your left buttock.  Take 1 tablet by mouth every 6 hours for the next 5 days.  You may take 1 tablet of Robaxin by mouth up to 2 times daily for muscle pain and spasms.  Do not work or drive until you know how this medication impacts you as it may make you drowsy.  Keep a follow-up appointment with Dr. Daisy Blossom.  You can follow-up with the gynecology clinic regarding concerns with your IUD.  Your IUD appears to be in the correct place today on your exam.  Return to the emergency department if you develop uncontrollable vomiting, high fevers, visual changes, new numbness or weakness, or other new, emergent symptoms.

## 2019-10-23 NOTE — ED Notes (Signed)
Attempted to obtain IV access x 2 unsuccessfully. Charge RN attempting at this time. Will continue to monitor.

## 2019-10-23 NOTE — ED Notes (Signed)
Patient given gauze to cover wound.

## 2019-10-23 NOTE — Telephone Encounter (Signed)
[employer] [place] [id number] (exp. 10/22/19 to 04/18/20) [place] medicaid Berkley Harvey: [id number] (exp. 10/23/19 to 04/20/20) order faxed to [facility] becasue that is who is in her network. Ph # [phone] fax # [phone].

## 2019-10-24 LAB — URINE CULTURE: Culture: 3000 — AB

## 2019-10-25 ENCOUNTER — Telehealth: Payer: Self-pay | Admitting: Emergency Medicine

## 2019-10-25 NOTE — Telephone Encounter (Signed)
Post ED Visit - Positive Culture Follow-up  Culture report reviewed by antimicrobial stewardship pharmacist: [facility] []  , Pharm.D. []  Enzo Bi, Pharm.D., BCPS AQ-ID []  , Pharm.D., BCPS []  Celedonio Miyamoto, Pharm.D., BCPS []  Nettie, Garvin Fila.D., BCPS, AAHIVP []  , Pharm.D., BCPS, AAHIVP []  Georgina Pillion, PharmD, BCPS []  , PharmD, BCPS []  [place], PharmD, BCPS []  [address], PharmD []  , PharmD, BCPS []  Estella Husk, PharmD  Pharmacy Team []  Lysle Pearl, PharmD []  , PharmD []  Phillips Climes, PharmD []  , Rph []  Agapito Games) , PharmD []  Verlan Friends, PharmD [x]  , PharmD []  Mervyn Gay, PharmD []  , PharmD []  Vinnie Level, PharmD []  [facility], PharmD []  , PharmD []  Len Childs, PharmD   Positive urine culture Treated with Cephalexin, organism sensitive to the same and no further patient follow-up is required at this time.  Elizbeth Posa 10/25/2019, 4:20 PM

## 2019-10-27 ENCOUNTER — Emergency Department (HOSPITAL_COMMUNITY): Payer: BLUE CROSS/BLUE SHIELD

## 2019-10-27 ENCOUNTER — Other Ambulatory Visit: Payer: Self-pay

## 2019-10-27 ENCOUNTER — Encounter (HOSPITAL_COMMUNITY): Payer: Self-pay | Admitting: Emergency Medicine

## 2019-10-27 DIAGNOSIS — R1013 Epigastric pain: Secondary | ICD-10-CM | POA: Diagnosis not present

## 2019-10-27 DIAGNOSIS — R0789 Other chest pain: Secondary | ICD-10-CM | POA: Diagnosis present

## 2019-10-27 DIAGNOSIS — Z79899 Other long term (current) drug therapy: Secondary | ICD-10-CM | POA: Insufficient documentation

## 2019-10-27 LAB — COMPREHENSIVE METABOLIC PANEL
ALT: 24 U/L (ref 0–44)
AST: 20 U/L (ref 15–41)
Albumin: 3.9 g/dL (ref 3.5–5.0)
Alkaline Phosphatase: 60 U/L (ref 38–126)
Anion gap: 9 (ref 5–15)
BUN: 9 mg/dL (ref 6–20)
CO2: 26 mmol/L (ref 22–32)
Calcium: 9 mg/dL (ref 8.9–10.3)
Chloride: 104 mmol/L (ref 98–111)
Creatinine, Ser: 0.89 mg/dL (ref 0.44–1.00)
GFR calc Af Amer: >60 mL/min (ref >60)
GFR calc non Af Amer: >60 mL/min (ref >60)
Glucose, Bld: 85 mg/dL (ref 70–99)
Potassium: 4.1 mmol/L (ref 3.5–5.1)
Sodium: 139 mmol/L (ref 135–145)
Total Bilirubin: 0.9 mg/dL (ref 0.3–1.2)
Total Protein: 7.9 g/dL (ref 6.5–8.1)

## 2019-10-27 LAB — CBC
HCT: 41.5 % (ref 36.0–46.0)
Hemoglobin: 13.2 g/dL (ref 12.0–15.0)
MCH: 27.9 pg (ref 26.0–34.0)
MCHC: 31.8 g/dL (ref 30.0–36.0)
MCV: 87.7 fL (ref 80.0–100.0)
Platelets: 257 10*3/uL (ref 150–400)
RBC: 4.73 MIL/uL (ref 3.87–5.11)
RDW: 12.3 % (ref 11.5–15.5)
WBC: 6.7 10*3/uL (ref 4.0–10.5)
nRBC: 0 % (ref 0.0–0.2)

## 2019-10-27 LAB — LIPASE, BLOOD: Lipase: 27 U/L (ref 11–51)

## 2019-10-27 LAB — I-STAT BETA HCG BLOOD, ED (NOT ORDERABLE): I-stat hCG, quantitative: <5 m[IU]/mL (ref <5)

## 2019-10-27 LAB — TROPONIN I (HIGH SENSITIVITY)
Troponin I (High Sensitivity): <2 ng/L (ref <18)
Troponin I (High Sensitivity): <2 ng/L (ref <18)

## 2019-10-27 MED ORDER — SODIUM CHLORIDE 0.9% FLUSH: 3.0000 mL | Freq: Once | INTRAVENOUS | Status: DC

## 2019-10-27 NOTE — ED Triage Notes (Signed)
Pt reports that she having chest pains on left side that started earlier today and burning in abd. Felt like needed to belch.

## 2019-10-28 ENCOUNTER — Emergency Department (HOSPITAL_COMMUNITY)
Admission: EM | Admit: 2019-10-28 | Discharge: 2019-10-28 | Disposition: A | Discharge status: Home / Self Care | Payer: BLUE CROSS/BLUE SHIELD | Attending: Emergency Medicine | Admitting: Emergency Medicine

## 2019-10-28 DIAGNOSIS — R1013 Epigastric pain: Secondary | ICD-10-CM

## 2019-10-28 LAB — URINALYSIS, ROUTINE W REFLEX MICROSCOPIC
Bilirubin Urine: NEGATIVE
Glucose, UA: NEGATIVE mg/dL
Ketones, ur: NEGATIVE mg/dL
Nitrite: NEGATIVE
Protein, ur: NEGATIVE mg/dL
Specific Gravity, Urine: 1.011 (ref 1.005–1.030)
pH: 6 (ref 5.0–8.0)

## 2019-10-28 MED ORDER — OMEPRAZOLE 20 MG PO CPDR: 20.0000 mg | DELAYED_RELEASE_CAPSULE | Freq: Every day | ORAL | 0 refills | Status: DC

## 2019-10-28 MED ORDER — LIDOCAINE VISCOUS HCL 2 % MT SOLN
15.0000 mL | Freq: Once | OROMUCOSAL | Status: AC
  Administered 2019-10-28: 03:00:00 15 mL via ORAL
  Filled 2019-10-28: qty 15

## 2019-10-28 MED ORDER — ALUM & MAG HYDROXIDE-SIMETH 200-200-20 MG/5ML PO SUSP
30.0000 mL | Freq: Once | ORAL | Status: AC
  Administered 2019-10-28: 03:00:00 30 mL via ORAL
  Filled 2019-10-28: qty 30

## 2019-10-28 NOTE — Discharge Instructions (Addendum)
Please follow up with your primary care provider for recheck of symptoms in the next week. Take Prilosec daily for symptoms of abdominal burning and chest pain. Please discuss continued use of Toradol with your doctor given current symptoms.

## 2019-10-28 NOTE — ED Provider Notes (Signed)
[facility]-EMERGENCY DEPT Provider Note   CSN: [id number] Arrival date & time: 10/27/19  1732     History Chief Complaint  Patient presents with  . Chest Pain  . Abdominal Pain    Amy Mendoza is a 39 y.o. female.  Patient with history of migraine, HLD, anxiety presents with onset around 1:00 of left chest discomfort and upper abdominal "burning" and sense of fullness. No fever, SOB, cough, nausea or vomiting. Symptoms have persisted throughout the day. She took a Pepcid without relief. She reports increase in belching but not passing any flatulence, which she feels would help her feel better. Last bowel movement was yesterday. She was recently prescribed PO Toradol for management of her migraines.   The history is provided by the patient. No language interpreter was used.       Past Medical History:  Diagnosis Date  . Anxiety   . Asthma   . Gallstones   . Hyperlipemia     Patient Active Problem List   Diagnosis Date Noted  . Chronic migraine without aura with status migrainosus, not intractable 10/21/2019  . Generalized anxiety disorder 09/18/2019  . Tic disorder 09/18/2019  . Dry skin dermatitis 09/18/2019  . Dry heaves 09/18/2019  . Personal history of covid-19 09/18/2019  . Obesity, Class III, BMI 40-49.9 (morbid obesity) (HCC) 07/07/2019  . Pneumonia due to COVID-19 virus 07/03/2019  . Acute respiratory failure with hypoxia (HCC) 07/03/2019  . Mild intermittent asthma 07/03/2019  . Routine general medical examination at a health care facility 01/18/2017    Past Surgical History:  Procedure Laterality Date  . CHOLECYSTECTOMY  feb 2006  . KNEE SURGERY     left     OB History   No obstetric history on file.     Family History  Problem Relation Age of Onset  . Diabetes Father        MGM, maternal uncle  . Heart failure Father   . Prostate cancer Maternal Grandfather   . Breast cancer Maternal Aunt   . Healthy Mother   .  Colon cancer Neg Hx   . Tics Neg Hx   . Migraines Neg Hx     Social History   Tobacco Use  . Smoking status: Never Smoker  . Smokeless tobacco: Never Used  Substance Use Topics  . Alcohol use: Not Currently    Comment: occasionally  . Drug use: Not Currently    Types: Marijuana    Comment: tried marijuana once in an edible but it made her freak out.     Home Medications Prior to Admission medications   Medication Sig Start Date End Date Taking? Authorizing Provider  cephALEXin (KEFLEX) 500 MG capsule Take 1 capsule (500 mg total) by mouth 4 (four) times daily. 10/23/19   McDonald, Mia A, PA-C  fluconazole (DIFLUCAN) 200 MG tablet Take 1 tablet by mouth today and take the second tablet by mouth after you finish your course of antibiotics. 10/23/19   McDonald, Mia A, PA-C  fluticasone (FLONASE) 50 MCG/ACT nasal spray Place 2 sprays into both nostrils daily. 10/19/19   Chilton Si, Sharion Settler, PA-C  Fremanezumab-vfrm (AJOVY) 225 MG/1.5ML SOAJ Inject 675 mg into the skin every 30 (thirty) days. 10/21/19   Anson Fret, MD  hydrOXYzine (ATARAX/VISTARIL) 25 MG tablet Take 1 tablet (25 mg total) by mouth every 6 (six) hours as needed for anxiety. 09/18/19   Marcine Matar, MD  ketorolac (TORADOL) 10 MG tablet Take 1 tablet (  10 mg total) by mouth every 6 (six) hours as needed for moderate pain. 10/15/19   Long, Arlyss Repress, MD  levonorgestrel (MIRENA) 20 MCG/24HR IUD 1 each by Intrauterine route once.    [provider]  methocarbamol (ROBAXIN) 500 MG tablet Take 1 tablet (500 mg total) by mouth 2 (two) times daily. 10/23/19   McDonald, Mia A, PA-C  Multiple Vitamin (MULTIVITAMIN WITH MINERALS) TABS tablet Take 1 tablet by mouth daily.    [provider]  ondansetron (ZOFRAN) 4 MG tablet Take 1 tablet (4 mg total) by mouth daily as needed for nausea or vomiting. 09/18/19   Marcine Matar, MD  SUMAtriptan (IMITREX) 50 MG tablet Take 1 tablet (50 mg total) at the start of headache. May  repeat 1 tablet (50 mg total) in 2 hours if headache persists or recurs. Maximum of 2 pills per 24 hours. Patient not taking: Reported on 10/21/2019 10/20/19   Rema Fendt, NP  topiramate (TOPAMAX) 50 MG tablet Take 1 tablet (50 mg total) by mouth at bedtime. 10/21/19   Anson Fret, MD  triamcinolone cream (KENALOG) 0.1 % Apply 1 application topically 2 (two) times daily as needed. 09/18/19   Marcine Matar, MD    Allergies    Sertraline  Review of Systems   Review of Systems  Constitutional: Negative for chills and fever.  HENT: Negative.   Respiratory: Negative.  Negative for cough and shortness of breath.   Cardiovascular: Positive for chest pain.  Gastrointestinal: Negative.  Negative for constipation, nausea and vomiting.       See HPI.  Musculoskeletal: Negative.   Skin: Negative.   Neurological: Negative.  Negative for weakness and headaches.    Physical Exam Updated Vital Signs BP 117/71 (BP Location: Left Arm)   Pulse 74   Temp 98.6 F (37 C)   Resp 17   SpO2 98%   Physical Exam Vitals and nursing note reviewed.  Constitutional:      Appearance: She is well-developed.  HENT:     Head: Normocephalic.  Cardiovascular:     Rate and Rhythm: Normal rate and regular rhythm.  Pulmonary:     Effort: Pulmonary effort is normal.     Breath sounds: Normal breath sounds. No wheezing, rhonchi or rales.  Abdominal:     General: Bowel sounds are normal.     Palpations: Abdomen is soft.     Tenderness: There is no abdominal tenderness. There is no guarding or rebound.  Musculoskeletal:        General: Normal range of motion.     Cervical back: Normal range of motion and neck supple.     Right lower leg: No edema.     Left lower leg: No edema.  Skin:    General: Skin is warm and dry.     Findings: No rash.  Neurological:     Mental Status: She is alert.     Cranial Nerves: No cranial nerve deficit.     ED Results / Procedures / Treatments   Labs (all labs  ordered are listed, but only abnormal results are displayed) Labs Reviewed  URINALYSIS, ROUTINE W REFLEX MICROSCOPIC - Abnormal; Notable for the following components:      Result Value   Hgb urine dipstick SMALL (*)    Leukocytes,Ua SMALL (*)    Bacteria, UA FEW (*)    All other components within normal limits  LIPASE, BLOOD  COMPREHENSIVE METABOLIC PANEL  CBC  I-STAT BETA HCG  BLOOD, ED ([facility], [facility], [facility] ONLY)  I-STAT BETA HCG BLOOD, ED (NOT ORDERABLE)  TROPONIN I (HIGH SENSITIVITY)  TROPONIN I (HIGH SENSITIVITY)   Results for orders placed or performed during the hospital encounter of 10/28/19  Lipase, blood  Result Value Ref Range   Lipase 27 11 - 51 U/L  Comprehensive metabolic panel  Result Value Ref Range   Sodium 139 135 - 145 mmol/L   Potassium 4.1 3.5 - 5.1 mmol/L   Chloride 104 98 - 111 mmol/L   CO2 26 22 - 32 mmol/L   Glucose, Bld 85 70 - 99 mg/dL   BUN 9 6 - 20 mg/dL   Creatinine, Ser 6.28 0.44 - 1.00 mg/dL   Calcium 9.0 8.9 - 36.6 mg/dL   Total Protein 7.9 6.5 - 8.1 g/dL   Albumin 3.9 3.5 - 5.0 g/dL   AST 20 15 - 41 U/L   ALT 24 0 - 44 U/L   Alkaline Phosphatase 60 38 - 126 U/L   Total Bilirubin 0.9 0.3 - 1.2 mg/dL   GFR calc non Af Amer >60 >60 mL/min   GFR calc Af Amer >60 >60 mL/min   Anion gap 9 5 - 15  CBC  Result Value Ref Range   WBC 6.7 4.0 - 10.5 K/uL   RBC 4.73 3.87 - 5.11 MIL/uL   Hemoglobin 13.2 12.0 - 15.0 g/dL   HCT 29.4 76.5 - 46.5 %   MCV 87.7 80.0 - 100.0 fL   MCH 27.9 26.0 - 34.0 pg   MCHC 31.8 30.0 - 36.0 g/dL   RDW 03.5 46.5 - 68.1 %   Platelets 257 150 - 400 K/uL   nRBC 0.0 0.0 - 0.2 %  Urinalysis, Routine w reflex microscopic  Result Value Ref Range   Color, Urine YELLOW YELLOW   APPearance CLEAR CLEAR   Specific Gravity, Urine 1.011 1.005 - 1.030   pH 6.0 5.0 - 8.0   Glucose, UA NEGATIVE NEGATIVE mg/dL   Hgb urine dipstick SMALL (A) NEGATIVE   Bilirubin Urine NEGATIVE NEGATIVE   Ketones, ur NEGATIVE NEGATIVE mg/dL   Protein, ur  NEGATIVE NEGATIVE mg/dL   Nitrite NEGATIVE NEGATIVE   Leukocytes,Ua SMALL (A) NEGATIVE   RBC / HPF 0-5 0 - 5 RBC/hpf   WBC, UA 0-5 0 - 5 WBC/hpf   Bacteria, UA FEW (A) NONE SEEN   Squamous Epithelial / LPF 6-10 0 - 5   Mucus PRESENT   I-Stat beta hCG blood, ED  Result Value Ref Range   I-stat hCG, quantitative <5.0 <5 mIU/mL   Comment 3          Troponin I (High Sensitivity)  Result Value Ref Range   Troponin I (High Sensitivity) <2 <18 ng/L  Troponin I (High Sensitivity)  Result Value Ref Range   Troponin I (High Sensitivity) <2 <18 ng/L    EKG EKG Interpretation  Date/Time:  Monday October 27 2019 17:50:22 EDT Ventricular Rate:  94 PR Interval:  150 QRS Duration: 70 QT Interval:  358 QTC Calculation: 447 R Axis:   64 Text Interpretation: Normal sinus rhythm Normal ECG Confirmed by Zadie Rhine ([id number]) on 10/28/2019 1:14:46 AM   Radiology DG Chest 2 View  Result Date: 10/27/2019 CLINICAL DATA:  Chest pain EXAM: CHEST - 2 VIEW COMPARISON:  Radiograph 08/25/2019, CT 08/10/2019 FINDINGS: No consolidation, features of edema, pneumothorax, or effusion. Pulmonary vascularity is normally distributed. The cardiomediastinal contours are unremarkable. No acute osseous or soft tissue abnormality. IMPRESSION: No acute  cardiopulmonary abnormality. Electronically Signed   By: Lovena Le M.D.   On: 10/27/2019 18:15    Procedures Procedures (including critical care time)  Medications Ordered in ED Medications  sodium chloride flush (NS) 0.9 % injection 3 mL (has no administration in time range)  alum & mag hydroxide-simeth (MAALOX/MYLANTA) 200-200-20 MG/5ML suspension 30 mL (has no administration in time range)    And  lidocaine (XYLOCAINE) 2 % viscous mouth solution 15 mL (has no administration in time range)    ED Course  I have reviewed the triage vital signs and the nursing notes.  Pertinent labs & imaging results that were available during my care of the patient were  reviewed by me and considered in my medical decision making (see chart for details).    MDM Rules/Calculators/A&P                      Patient to ED with symptoms as detailed in the HPI.  She is very well appearing. Labs are essentially normal, including troponin x 2. EKG unremarkable. CXR clear. Symptoms atypical for ACS. No pleuritic symptoms, tachycardia or hypoxia. Doubt PE, dissection. Symptoms likely related to GI source with daily Toradol, abdominal burning sensation and belching. Will start on Prilosec daily, continue Pepcid. She should discuss migraine treatment with her neurologist given current symptoms.   She is felt appropriate for discharge home.   Final Clinical Impression(s) / ED Diagnoses Final diagnoses:  None   1. Dyspepsia   Rx / DC Orders ED Discharge Orders    None       Dennie Bible 10/28/19 0138    Ripley Fraise, MD 10/28/19 [phone]

## 2019-10-29 ENCOUNTER — Encounter: Payer: Self-pay | Admitting: Internal Medicine

## 2019-10-29 DIAGNOSIS — F411 Generalized anxiety disorder: Secondary | ICD-10-CM

## 2019-10-29 NOTE — Telephone Encounter (Signed)
MRI brain report from [facility] shows conclusion: No acute intracranial abnormality. Read by Dr. Lovett Sox. Report here for review.

## 2019-10-30 ENCOUNTER — Other Ambulatory Visit: Payer: Self-pay | Admitting: Internal Medicine

## 2019-10-30 DIAGNOSIS — F411 Generalized anxiety disorder: Secondary | ICD-10-CM

## 2019-11-04 ENCOUNTER — Encounter: Payer: Self-pay | Admitting: Internal Medicine

## 2019-11-12 ENCOUNTER — Ambulatory Visit: Payer: BLUE CROSS/BLUE SHIELD | Admitting: Neurology

## 2019-11-13 ENCOUNTER — Ambulatory Visit: Payer: BLUE CROSS/BLUE SHIELD | Attending: Family | Admitting: Family Medicine

## 2019-11-13 ENCOUNTER — Other Ambulatory Visit: Payer: Self-pay

## 2019-11-13 ENCOUNTER — Encounter: Payer: Self-pay | Admitting: Family Medicine

## 2019-11-13 VITALS — BP 127/85 | HR 93 | Temp 98.4°F | Ht 69.0 in | Wt 264.8 lb

## 2019-11-13 DIAGNOSIS — R6884 Jaw pain: Secondary | ICD-10-CM | POA: Diagnosis not present

## 2019-11-13 DIAGNOSIS — G43009 Migraine without aura, not intractable, without status migrainosus: Secondary | ICD-10-CM | POA: Diagnosis not present

## 2019-11-13 DIAGNOSIS — J309 Allergic rhinitis, unspecified: Secondary | ICD-10-CM

## 2019-11-13 DIAGNOSIS — R519 Headache, unspecified: Secondary | ICD-10-CM

## 2019-11-13 DIAGNOSIS — R0683 Snoring: Secondary | ICD-10-CM

## 2019-11-13 DIAGNOSIS — E010 Iodine-deficiency related diffuse (endemic) goiter: Secondary | ICD-10-CM

## 2019-11-13 DIAGNOSIS — H6693 Otitis media, unspecified, bilateral: Secondary | ICD-10-CM

## 2019-11-13 MED ORDER — AMOXICILLIN-POT CLAVULANATE 500-125 MG PO TABS: 1.0000 | ORAL_TABLET | Freq: Two times a day (BID) | ORAL | 0 refills | Status: AC

## 2019-11-13 MED ORDER — PREDNISONE 20 MG PO TABS: ORAL_TABLET | ORAL | 0 refills | Status: DC

## 2019-11-13 MED ORDER — FLUCONAZOLE 200 MG PO TABS: ORAL_TABLET | ORAL | 0 refills | Status: DC

## 2019-11-13 NOTE — Progress Notes (Signed)
Established Patient Office Visit  Subjective:  Patient ID: Amy Mendoza, female    DOB: May 12, 1981  Age: 39 y.o. MRN: [id number]  CC: No chief complaint on file.   HPI Amy Mendoza, 39 year old female who presents secondary to complaint of severe headaches.  Patient with history of chronic migraines and is status post neurology evaluation on 10/21/2019 with Dr. Lavell Anchors.  She reports that she is also had multiple emergency department visits regarding her headaches and facial pain.  She reports that after one of her recent emergency department visit she was told to take allergy medication and she started over-the-counter Zyrtec and she has had some decrease in facial pressure.  She states that her headaches feel as if someone is drilling into the temples bilaterally and when she gets this type of headache she also has sensitivity to light and nausea.  She believes that this is more of her typical migraine type headache.  She also however feels as if she is having a different type of headache with facial pain in the area just in front of her ears bilaterally and she feels as if the muscles in her jaws are tight and she has pain along the upper teeth.  Upon questioning, she admits that she has been told recently and in the past that she tends to grind her teeth during her sleep and she is also been told that she snores.  She does sometimes wake up with morning headaches as well as feeling fatigued throughout the day.  Despite the use of her Zyrtec for nasal congestion, she continues to have worsening bilateral ear pain over the past few days as well as nasal congestion.  She sometimes has a popping sensation in her ears when she opens and closes her mouth.  She denies any increased facial or jaw pain with chewing.  She currently feels as if she does have some decreased hearing as if sound is sometimes muffled.  She believes that she likely sleeps with her mouth open due to her nasal congestion.  She  also has sensation of postnasal drainage.  She denies any sore throat or difficulty swallowing but does feel as if her mid neck/thyroid area is more swollen recently.  She has not yet obtained the MRI of the brain that was discussed at her neurology visit.  She reports that when she had Covid infection a few months ago, she was told that her oxygen levels were dropping at night and she required up to 8 L of oxygen and she was told that she should have a sleep study to see if sleep apnea was a contributing factor to her low nighttime oxygen levels.  She states that she would like a sleep study as she was also told that sleep apnea could cause her to have atrial fibrillation.  Past Medical History:  Diagnosis Date  . Anxiety   . Asthma   . Gallstones   . Hyperlipemia     Past Surgical History:  Procedure Laterality Date  . CHOLECYSTECTOMY  feb 2006  . KNEE SURGERY     left    Family History  Problem Relation Age of Onset  . Diabetes Father        MGM, maternal uncle  . Heart failure Father   . Prostate cancer Maternal Grandfather   . Breast cancer Maternal Aunt   . Healthy Mother   . Colon cancer Neg Hx   . Tics Neg Hx   . Migraines Neg Hx  Social History   Socioeconomic History  . Marital status: Single    Spouse name: Not on file  . Number of children: 1  . Years of education: Not on file  . Highest education level: Associate degree: occupational, Scientist, product/process development, or vocational program  Occupational History  . Occupation: Conservation officer, nature  Tobacco Use  . Smoking status: Never Smoker  . Smokeless tobacco: Never Used  Substance and Sexual Activity  . Alcohol use: Not Currently    Comment: occasionally  . Drug use: Not Currently    Types: Marijuana    Comment: tried marijuana once in an edible but it made her freak out.   Marland Kitchen Sexual activity: Yes    Birth control/protection: I.U.D.  Other Topics Concern  . Not on file  Social History Narrative   Lives with her mother   Right  handed   Caffeine: none    Social Determinants of Health   Financial Resource Strain:   . Difficulty of Paying Living Expenses:   Food Insecurity:   . Worried About Programme researcher, broadcasting/film/video in the Last Year:   . Barista in the Last Year:   Transportation Needs:   . Freight forwarder (Medical):   Marland Kitchen Lack of Transportation (Non-Medical):   Physical Activity:   . Days of Exercise per Week:   . Minutes of Exercise per Session:   Stress:   . Feeling of Stress :   Social Connections:   . Frequency of Communication with Friends and Family:   . Frequency of Social Gatherings with Friends and Family:   . Attends Religious Services:   . Active Member of Clubs or Organizations:   . Attends Banker Meetings:   Marland Kitchen Marital Status:   Intimate Partner Violence:   . Fear of Current or Ex-Partner:   . Emotionally Abused:   Marland Kitchen Physically Abused:   . Sexually Abused:     Outpatient Medications Prior to Visit  Medication Sig Dispense Refill  . fluticasone (FLONASE) 50 MCG/ACT nasal spray Place 2 sprays into both nostrils daily. 9.9 mL 2  . hydrOXYzine (ATARAX/VISTARIL) 25 MG tablet TAKE 1 TABLET (25 MG TOTAL) BY MOUTH EVERY 6 (SIX) HOURS AS NEEDED FOR ANXIETY. 360 tablet 1  . Multiple Vitamin (MULTIVITAMIN WITH MINERALS) TABS tablet Take 1 tablet by mouth daily.    Marland Kitchen omeprazole (PRILOSEC) 20 MG capsule Take 1 capsule (20 mg total) by mouth daily. 20 capsule 0  . ondansetron (ZOFRAN) 4 MG tablet Take 1 tablet (4 mg total) by mouth daily as needed for nausea or vomiting. 30 tablet 1  . topiramate (TOPAMAX) 50 MG tablet Take 1 tablet (50 mg total) by mouth at bedtime. 30 tablet 3  . cephALEXin (KEFLEX) 500 MG capsule Take 1 capsule (500 mg total) by mouth 4 (four) times daily. (Patient not taking: Reported on 11/13/2019) 20 capsule 0  . Fremanezumab-vfrm (AJOVY) 225 MG/1.5ML SOAJ Inject 675 mg into the skin every 30 (thirty) days. (Patient not taking: Reported on 11/13/2019) 3 pen 0    . ketorolac (TORADOL) 10 MG tablet Take 1 tablet (10 mg total) by mouth every 6 (six) hours as needed for moderate pain. (Patient not taking: Reported on 11/13/2019) 20 tablet 0  . levonorgestrel (MIRENA) 20 MCG/24HR IUD 1 each by Intrauterine route once.    . methocarbamol (ROBAXIN) 500 MG tablet Take 1 tablet (500 mg total) by mouth 2 (two) times daily. (Patient not taking: Reported on 11/13/2019) 20 tablet 0  .  SUMAtriptan (IMITREX) 50 MG tablet Take 1 tablet (50 mg total) at the start of headache. May repeat 1 tablet (50 mg total) in 2 hours if headache persists or recurs. Maximum of 2 pills per 24 hours. (Patient not taking: Reported on 10/21/2019) 10 tablet 1  . triamcinolone cream (KENALOG) 0.1 % Apply 1 application topically 2 (two) times daily as needed. (Patient not taking: Reported on 11/13/2019) 80 g 0  . fluconazole (DIFLUCAN) 200 MG tablet Take 1 tablet by mouth today and take the second tablet by mouth after you finish your course of antibiotics. (Patient not taking: Reported on 11/13/2019) 2 tablet 0   No facility-administered medications prior to visit.    Allergies  Allergen Reactions  . Sertraline     Pt states it made her crazy/worsened anxiety.     ROS Review of Systems  Constitutional: Positive for fatigue. Negative for chills and fever.  HENT: Positive for congestion, ear pain, postnasal drip, rhinorrhea and sinus pressure. Negative for nosebleeds, sore throat and trouble swallowing.        Sensation of decreased hearing  Eyes: Positive for photophobia. Negative for visual disturbance.  Respiratory: Negative for cough and shortness of breath.   Cardiovascular: Negative for chest pain, palpitations and leg swelling.  Gastrointestinal: Negative for abdominal pain, constipation, diarrhea and nausea.  Endocrine: Negative for polydipsia, polyphagia and polyuria.  Genitourinary: Negative for dysuria and frequency.  Neurological: Positive for headaches. Negative for dizziness  and syncope.  Hematological: Negative for adenopathy. Does not bruise/bleed easily.  Psychiatric/Behavioral: Positive for sleep disturbance. Negative for self-injury and suicidal ideas. The patient is nervous/anxious.       Objective:    Physical Exam  Constitutional: She is oriented to person, place, and time. She appears well-developed and well-nourished.  Well-nourished well-developed overweight for height/obese female in no acute distress, patient with a slight nasal quality to her voice.  She is wearing a facial mask as per office COVID-19 protocol.  HENT:  Right Ear: Tympanic membrane is erythematous.  Left Ear: Tympanic membrane is erythematous.  Nose: Mucosal edema and rhinorrhea present. Right sinus exhibits maxillary sinus tenderness. Right sinus exhibits no frontal sinus tenderness. Left sinus exhibits maxillary sinus tenderness. Left sinus exhibits no frontal sinus tenderness.  Mouth/Throat: Posterior oropharyngeal edema and posterior oropharyngeal erythema present. No oropharyngeal exudate.  TMs are erythematous and thickened bilaterally and no visible landmarks.  Patient with marked edema of the nasal turbinates which are pale with a blue tent and patient with clear to white nasal discharge.  Patient with mild posterior pharynx erythema with some cobblestoning.  Slightly large tongue base.  When pressure applied to the tragus of the ears bilaterally, patient has decreased discomfort with opening and closing of her mouth  Neck: Thyromegaly present.  Large neck size  Cardiovascular: Normal rate and regular rhythm.  Pulmonary/Chest: Effort normal and breath sounds normal.  Abdominal: Soft. There is no abdominal tenderness. There is no rebound and no guarding.  Musculoskeletal:        General: No tenderness or edema.     Cervical back: Normal range of motion and neck supple.  Lymphadenopathy:    She has cervical adenopathy (Mild anterior cervical chain LAD bilaterally which is  slightly tender).  Neurological: She is alert and oriented to person, place, and time. No cranial nerve deficit.  Skin: Skin is warm and dry.  Psychiatric: She has a normal mood and affect. Her behavior is normal.  Nursing note and vitals reviewed.  BP 127/85   Pulse 93   Temp 98.4 F (36.9 C) (Temporal)   Ht 5\' 9"  (1.753 m)   Wt 264 lb 12.8 oz (120.1 kg)   LMP 11/13/2019   SpO2 97%   BMI 39.10 kg/m  Wt Readings from Last 3 Encounters:  11/13/19 264 lb 12.8 oz (120.1 kg)  10/28/19 264 lb (119.7 kg)  10/22/19 264 lb (119.7 kg)     Health Maintenance Due  Topic Date Due  . COVID-19 Vaccine (1) Never done  . PAP SMEAR-Modifier  Never done    There are no preventive care reminders to display for this patient.  Lab Results  Component Value Date   TSH 0.845 10/21/2019   Lab Results  Component Value Date   WBC 6.7 10/27/2019   HGB 13.2 10/27/2019   HCT 41.5 10/27/2019   MCV 87.7 10/27/2019   PLT 257 10/27/2019   Lab Results  Component Value Date   NA 139 10/27/2019   K 4.1 10/27/2019   CO2 26 10/27/2019   GLUCOSE 85 10/27/2019   BUN 9 10/27/2019   CREATININE 0.89 10/27/2019   BILITOT 0.9 10/27/2019   ALKPHOS 60 10/27/2019   AST 20 10/27/2019   ALT 24 10/27/2019   PROT 7.9 10/27/2019   ALBUMIN 3.9 10/27/2019   CALCIUM 9.0 10/27/2019   ANIONGAP 9 10/27/2019   GFR 98.02 11/02/2010   Lab Results  Component Value Date   CHOL 169 02/07/2016   Lab Results  Component Value Date   HDL 40 (L) 02/07/2016   Lab Results  Component Value Date   LDLCALC 96 02/07/2016   Lab Results  Component Value Date   TRIG 51 07/03/2019   Lab Results  Component Value Date   CHOLHDL 4.2 02/07/2016   Lab Results  Component Value Date   HGBA1C 5.5 02/07/2016      Assessment & Plan:  1. Migraine without aura and without status migrainosus, not intractable Continue current medications for the treatment of chronic migraines which per patient tend to be temporal  headaches with nausea and light sensitivity.  Continue follow-up with neurology.  2. Jaw pain Based on examination, suspect that patient has issues with TMJ inflammation or possible arthritis in addition to possible issues with teeth grinding/clenching her teeth.  She is encouraged to follow-up with ENT for further evaluation and she may also benefit from a mouthguard to help prevent jaw clenching/teeth grinding.  She is being placed on a short taper of prednisone to help with possible inflammation of the TMJ as well as with her nasal congestion with facial pressure. - Ambulatory referral to ENT - predniSONE (DELTASONE) 20 MG tablet; Take 2 pills daily x2 days, 1 pill daily x2 days, then half pill daily x4 days.  Take after eating  Dispense: 8 tablet; Refill: 0  3. Bilateral otitis media, unspecified otitis media type Patient with findings consistent with bilateral otitis media today's visit and she will be placed on Augmentin which should also provide coverage of any sinusitis that may be present.  She is also being placed on prednisone taper to help with facial pressure and per patient request prescription for fluconazole in case of yeast infection related to antibiotic use as she states that this tends to recur.  On review of chart, patient is not diabetic and most recent hemoglobin A1c was 5.5. - Ambulatory referral to ENT - amoxicillin-clavulanate (AUGMENTIN) 500-125 MG tablet; Take 1 tablet (500 mg total) by mouth 2 (two) times daily for 10  days. Take after eating.  Dispense: 20 tablet; Refill: 0 - fluconazole (DIFLUCAN) 200 MG tablet; Take 1 tablet by mouth today and take the second tablet by mouth after you finish your course of antibiotics.  Dispense: 2 tablet; Refill: 0 - predniSONE (DELTASONE) 20 MG tablet; Take 2 pills daily x2 days, 1 pill daily x2 days, then half pill daily x4 days.  Take after eating  Dispense: 8 tablet; Refill: 0  4. Snoring Patient with snoring, obesity, daytime  fatigue and morning headaches for which she will be scheduled for split-night sleep study as she likely has obstructive sleep apnea and may benefit from the use of CPAP.  She also reports that when she was hospitalized for Covid she was told that sleep apnea could be contributing to her nocturnal drops in oxygen. - Split night study; Future  5. Thyromegaly Patient with thyromegaly on examination with possible asymmetry of the thyroid gland with possible slight right sided thyroid enlargement.  Patient is being scheduled for thyroid ultrasound.  She has had recent normal thyroid blood work on 10/21/2019. - US THYROID; Future  6. Allergic rhinitis, unspecified seasonality, unspecified trigger She is encouraged to continue the use of Zyrtec and she may also wish to obtain a steroid nasal spray.  She is encouraged to take Zyrtec in the evenings to help prevent daytime sedation but she is also being scheduled for sleep study due to her snoring, fatigue and occasional morning headaches.  Prednisone taper being prescribed to help with possible TMJ inflammation as well as allergic rhinitis with facial pressure. - predniSONE (DELTASONE) 20 MG tablet; Take 2 pills daily x2 days, 1 pill daily x2 days, then half pill daily x4 days.  Take after eating  Dispense: 8 tablet; Refill: 0  7. Recurrent headache She is to continue follow-up with neurology and obtain MRI.  I discussed with the patient that she likely has several issues occurring including possible TMJ inflammation, allergic rhinitis with facial pressure and patient also with evidence of otitis media at today's visit and patient additionally has chronic issues with migraine type headaches.  She does admit to being under increased stress and may also have tension headaches.   An After Visit Summary was printed and given to the patient.   Follow-up: Return in about 4 weeks (around 12/11/2019) for Headaches-follow-up with PCP in 3 to 4 weeks.  Follow-up sooner  if not feeling better  More than 45 minutes of face-to-face time was spent with the patient at today's visit discussing her symptoms, performing examination, formulating an assessment and plan and placing orders for referrals and medications.  An additional 18 minutes was spent on review of chart as patient has had several emergency department visits as well as neurology follow-up, and completion of today's notes.  Cain Saupe, MD

## 2019-11-13 NOTE — Patient Instructions (Addendum)
Continue use of Zyrtec at bedtime to help with nasal congestion/postnasal drainage.  Prescription sent to your pharmacy for Augmentin 500 mg twice daily, please eat before taking the medication.  Prescription is also being sent to your pharmacy for prednisone taper.  Also please eat before taking the prednisone   Temporomandibular Joint Syndrome  Temporomandibular joint syndrome (TMJ syndrome) is a condition that causes pain in the temporomandibular joints. These joints are located near your ears and allow your jaw to open and close. For people with TMJ syndrome, chewing, biting, or other movements of the jaw can be difficult or painful. TMJ syndrome is often mild and goes away within a few weeks. However, sometimes the condition becomes a long-term (chronic) problem. What are the causes? This condition may be caused by:  Grinding your teeth or clenching your jaw. Some people do this when they are under stress.  Arthritis.  Injury to the jaw.  Head or neck injury.  Teeth or dentures that are not aligned well. In some cases, the cause of TMJ syndrome may not be known. What are the signs or symptoms? The most common symptom of this condition is an aching pain on the side of the head in the area of the TMJ. Other symptoms may include:  Pain when moving your jaw, such as when chewing or biting.  Being unable to open your jaw all the way.  Making a clicking sound when you open your mouth.  Headache.  Earache.  Neck or shoulder pain. How is this diagnosed? This condition may be diagnosed based on:  Your symptoms and medical history.  A physical exam. Your health care provider may check the range of motion of your jaw.  Imaging tests, such as X-rays or an MRI. You may also need to see your dentist, who will determine if your teeth and jaw are lined up correctly. How is this treated? TMJ syndrome often goes away on its own. If treatment is needed, the options may  include:  Eating soft foods and applying ice or heat.  Medicines to relieve pain or inflammation.  Medicines or massage to relax the muscles.  A splint, bite plate, or mouthpiece to prevent teeth grinding or jaw clenching.  Relaxation techniques or counseling to help reduce stress.  A therapy for pain in which an electrical current is applied to the nerves through the skin (transcutaneous electrical nerve stimulation).  Acupuncture. This is sometimes helpful to relieve pain.  Jaw surgery. This is rarely needed. Follow these instructions at home:  Eating and drinking  Eat a soft diet if you are having trouble chewing.  Avoid foods that require a lot of chewing. Do not chew gum. General instructions  Take over-the-counter and prescription medicines only as told by your health care provider.  If directed, put ice on the painful area. ? Put ice in a plastic bag. ? Place a towel between your skin and the bag. ? Leave the ice on for 20 minutes, 2-3 times a day.  Apply a warm, wet cloth (warm compress) to the painful area as directed.  Massage your jaw area and do any jaw stretching exercises as told by your health care provider.  If you were given a splint, bite plate, or mouthpiece, wear it as told by your health care provider.  Keep all follow-up visits as told by your health care provider. This is important. Contact a health care provider if:  You are having trouble eating.  You have new or worsening  symptoms. Get help right away if:  Your jaw locks open or closed. Summary  Temporomandibular joint syndrome (TMJ syndrome) is a condition that causes pain in the temporomandibular joints. These joints are located near your ears and allow your jaw to open and close.  TMJ syndrome is often mild and goes away within a few weeks. However, sometimes the condition becomes a long-term (chronic) problem.  Symptoms include an aching pain on the side of the head in the area of  the TMJ, pain when chewing or biting, and being unable to open your jaw all the way. You may also make a clicking sound when you open your mouth.  TMJ syndrome often goes away on its own. If treatment is needed, it may include medicines to relieve pain, reduce inflammation, or relax the muscles. A splint, bite plate, or mouthpiece may also be used to prevent teeth grinding or jaw clenching. This information is not intended to replace advice given to you by your health care provider. Make sure you discuss any questions you have with your health care provider. Document Revised: 09/14/2017 Document Reviewed: 08/14/2017 Elsevier Patient Education  2020 [employer].

## 2019-11-13 NOTE — Progress Notes (Signed)
Several headaches that's causing her to have severe jaw pain.

## 2019-11-14 ENCOUNTER — Ambulatory Visit: Payer: Medicaid Other | Admitting: Family

## 2019-11-20 ENCOUNTER — Telehealth: Payer: Self-pay | Admitting: Internal Medicine

## 2019-11-20 ENCOUNTER — Telehealth: Payer: Self-pay

## 2019-11-20 ENCOUNTER — Other Ambulatory Visit: Payer: Self-pay | Admitting: Family Medicine

## 2019-11-20 DIAGNOSIS — R519 Headache, unspecified: Secondary | ICD-10-CM

## 2019-11-20 DIAGNOSIS — R0683 Snoring: Secondary | ICD-10-CM

## 2019-11-20 NOTE — Progress Notes (Signed)
Patient ID: Amy Mendoza, female   DOB: [birth year], 39 y.o.   MRN: [id number]    Insurance will only cover home sleep study and new order needed

## 2019-11-20 NOTE — Telephone Encounter (Signed)
I spoke to [place] at [facility]. She has the authorization for the in-home sleep study but needs a new order for the in-home study.

## 2019-11-20 NOTE — Telephone Encounter (Signed)
Terry from sleep center called and requested for a new to be placed for the home sleep test due to the insurance denying previous order. Please follow up at your earliest convenience.

## 2019-11-25 ENCOUNTER — Other Ambulatory Visit: Payer: Self-pay | Admitting: Family Medicine

## 2019-11-25 ENCOUNTER — Encounter: Payer: Self-pay | Admitting: Family Medicine

## 2019-11-25 DIAGNOSIS — R1110 Vomiting, unspecified: Secondary | ICD-10-CM

## 2019-11-25 NOTE — Telephone Encounter (Signed)
Will forward to pcp

## 2019-11-25 NOTE — Progress Notes (Signed)
Patient ID: Amy Mendoza, female   DOB: 11-26-1980, 39 y.o.   MRN: [id number]   Message received from patient that she is having issues with burning sensation in her stomach and from prior note with her PCP, patient was having issues with dry heaves.  Referral will be placed for patient to be seen by gastroenterology.

## 2019-11-26 ENCOUNTER — Telehealth: Payer: Self-pay

## 2019-11-26 NOTE — Telephone Encounter (Signed)
Call placed to [facility], spoke to [place] who confirm receipt of the order for the home sleep study

## 2019-11-28 ENCOUNTER — Other Ambulatory Visit: Payer: Self-pay

## 2019-11-28 DIAGNOSIS — R1013 Epigastric pain: Secondary | ICD-10-CM | POA: Diagnosis present

## 2019-11-28 DIAGNOSIS — J45909 Unspecified asthma, uncomplicated: Secondary | ICD-10-CM | POA: Diagnosis not present

## 2019-11-28 MED ORDER — SODIUM CHLORIDE 0.9% FLUSH
3.0000 mL | Freq: Once | INTRAVENOUS | Status: AC
  Administered 2019-11-29: 3 mL via INTRAVENOUS

## 2019-11-28 NOTE — ED Triage Notes (Signed)
Pt arrived to ED with c/o abdominal pain/burning all over in abdomen to upper body. Denies N/V/D. Stated that she had covid in December and has had on and off issues since then.

## 2019-11-29 ENCOUNTER — Emergency Department (HOSPITAL_COMMUNITY)
Admission: EM | Admit: 2019-11-29 | Discharge: 2019-11-29 | Disposition: A | Discharge status: Home / Self Care | Payer: BLUE CROSS/BLUE SHIELD | Attending: Emergency Medicine | Admitting: Emergency Medicine

## 2019-11-29 DIAGNOSIS — R1013 Epigastric pain: Secondary | ICD-10-CM

## 2019-11-29 LAB — COMPREHENSIVE METABOLIC PANEL
ALT: 23 U/L (ref 0–44)
AST: 21 U/L (ref 15–41)
Albumin: 4.1 g/dL (ref 3.5–5.0)
Alkaline Phosphatase: 55 U/L (ref 38–126)
Anion gap: 7 (ref 5–15)
BUN: 8 mg/dL (ref 6–20)
CO2: 24 mmol/L (ref 22–32)
Calcium: 9.2 mg/dL (ref 8.9–10.3)
Chloride: 107 mmol/L (ref 98–111)
Creatinine, Ser: 0.8 mg/dL (ref 0.44–1.00)
GFR calc Af Amer: >60 mL/min (ref >60)
GFR calc non Af Amer: >60 mL/min (ref >60)
Glucose, Bld: 86 mg/dL (ref 70–99)
Potassium: 4.1 mmol/L (ref 3.5–5.1)
Sodium: 138 mmol/L (ref 135–145)
Total Bilirubin: 0.8 mg/dL (ref 0.3–1.2)
Total Protein: 7.9 g/dL (ref 6.5–8.1)

## 2019-11-29 LAB — URINALYSIS, ROUTINE W REFLEX MICROSCOPIC
Bilirubin Urine: NEGATIVE
Glucose, UA: NEGATIVE mg/dL
Hgb urine dipstick: NEGATIVE
Ketones, ur: NEGATIVE mg/dL
Leukocytes,Ua: NEGATIVE
Nitrite: NEGATIVE
Protein, ur: NEGATIVE mg/dL
Specific Gravity, Urine: 1.011 (ref 1.005–1.030)
pH: 6 (ref 5.0–8.0)

## 2019-11-29 LAB — CBC
HCT: 41.9 % (ref 36.0–46.0)
Hemoglobin: 13.1 g/dL (ref 12.0–15.0)
MCH: 27.6 pg (ref 26.0–34.0)
MCHC: 31.3 g/dL (ref 30.0–36.0)
MCV: 88.2 fL (ref 80.0–100.0)
Platelets: 249 10*3/uL (ref 150–400)
RBC: 4.75 MIL/uL (ref 3.87–5.11)
RDW: 13.1 % (ref 11.5–15.5)
WBC: 8.2 10*3/uL (ref 4.0–10.5)
nRBC: 0 % (ref 0.0–0.2)

## 2019-11-29 LAB — PREGNANCY, URINE: Preg Test, Ur: NEGATIVE

## 2019-11-29 LAB — LIPASE, BLOOD: Lipase: 28 U/L (ref 11–51)

## 2019-11-29 MED ORDER — FAMOTIDINE IN NACL 20-0.9 MG/50ML-% IV SOLN
20.0000 mg | Freq: Once | INTRAVENOUS | Status: AC
  Administered 2019-11-29: 20 mg via INTRAVENOUS
  Filled 2019-11-29: qty 50

## 2019-11-29 MED ORDER — SUCRALFATE 1 G PO TABS: 1.0000 g | ORAL_TABLET | Freq: Three times a day (TID) | ORAL | 0 refills | Status: DC

## 2019-11-29 MED ORDER — DICYCLOMINE HCL 10 MG PO CAPS
10.0000 mg | ORAL_CAPSULE | Freq: Once | ORAL | Status: AC
  Administered 2019-11-29: 10 mg via ORAL
  Filled 2019-11-29: qty 1

## 2019-11-29 MED ORDER — LIDOCAINE VISCOUS HCL 2 % MT SOLN
15.0000 mL | Freq: Once | OROMUCOSAL | Status: AC
  Administered 2019-11-29: 15 mL via ORAL
  Filled 2019-11-29: qty 15

## 2019-11-29 MED ORDER — ALUM & MAG HYDROXIDE-SIMETH 200-200-20 MG/5ML PO SUSP
30.0000 mL | Freq: Once | ORAL | Status: AC
  Administered 2019-11-29: 30 mL via ORAL
  Filled 2019-11-29: qty 30

## 2019-11-29 MED ORDER — OMEPRAZOLE 20 MG PO CPDR: 20.0000 mg | DELAYED_RELEASE_CAPSULE | Freq: Every day | ORAL | 0 refills | Status: DC

## 2019-11-29 MED ORDER — SUCRALFATE 1 G PO TABS
1.0000 g | ORAL_TABLET | Freq: Once | ORAL | Status: AC
  Administered 2019-11-29: 1 g via ORAL
  Filled 2019-11-29: qty 1

## 2019-11-29 NOTE — ED Notes (Signed)
Pt lying in bed moaning. Pt asking for pain meds. MD to see pt. NAD noted. Will continue to monitor.

## 2019-11-29 NOTE — ED Provider Notes (Signed)
[facility] DEPT Provider Note   CSN: [id number] Arrival date & time: 11/28/19  2321     History Chief Complaint  Patient presents with   Abdominal Pain    Amy Mendoza is a 39 y.o. female with a history of tic disorder, generalized anxiety disorder, asthma, migraines who presents to the emergency department with a chief complaint of epigastric pain. The patient endorses "burning" epigastric pain that radiates up into her chest.  She also feels as if it intermittently radiates through out her entire abdomen.  She reports a history of similar pain, but reports that the current pain has returned over the last few days and is more intense in severity.  She also notes that she has had approximately 2-3 episodes of dry heaving daily for the last few days.  She is unsure if the dry heaving is associated with increasing intensity of the pain.  No fever, chills, headaches, vomiting, hematemesis, melena, hematochezia, chest pain, shortness of breath, burping, cough, and vaginal bleeding, vaginal pain, dysuria, hematuria, and flank pain.  No treatment prior to arrival.  Reports that she recently made an outpatient appointment with gastroenterology, but cannot be seen until June.  She was previously taking omeprazole, but has run out of the medication.  She has not tried taking any medications at home including Carafate, Pepcid, Tums.  Reports that she has previously taken Carafate for symptoms.  Denies any recent alcohol use.  No significant change in her diet with decreasing spicy foods.  Reports that she already sleeps with 4 pillows at night and symptoms have not seem worse when she lays flat.   The history is provided by the patient. No language interpreter was used.       Past Medical History:  Diagnosis Date   Anxiety    Asthma    Gallstones    Hyperlipemia     Patient Active Problem List   Diagnosis Date Noted   Chronic migraine without aura  with status migrainosus, not intractable 10/21/2019   Generalized anxiety disorder 09/18/2019   Tic disorder 09/18/2019   Dry skin dermatitis 09/18/2019   Dry heaves 09/18/2019   Personal history of covid-19 09/18/2019   Obesity, Class III, BMI 40-49.9 (morbid obesity) ([place]) 07/07/2019   Pneumonia due to COVID-19 virus 07/03/2019   Acute respiratory failure with hypoxia ([place]) 07/03/2019   Mild intermittent asthma 07/03/2019   Routine general medical examination at a health care facility 01/18/2017    Past Surgical History:  Procedure Laterality Date   CHOLECYSTECTOMY  feb 2006   KNEE SURGERY     left     OB History   No obstetric history on file.     Family History  Problem Relation Age of Onset   Diabetes Father        MGM, maternal uncle   Heart failure Father    Prostate cancer Maternal Grandfather    Breast cancer Maternal Aunt    Healthy Mother    Colon cancer Neg Hx    Tics Neg Hx    Migraines Neg Hx     Social History   Tobacco Use   Smoking status: Never Smoker   Smokeless tobacco: Never Used  Substance Use Topics   Alcohol use: Not Currently    Comment: occasionally   Drug use: Not Currently    Types: Marijuana    Comment: tried marijuana once in an edible but it made her freak out.     Home Medications  Prior to Admission medications   Medication Sig Start Date End Date Taking? Authorizing Provider  fluconazole (DIFLUCAN) 200 MG tablet Take 1 tablet by mouth today and take the second tablet by mouth after you finish your course of antibiotics. 11/13/19   Fulp, Cammie, MD  fluticasone (FLONASE) 50 MCG/ACT nasal spray Place 2 sprays into both nostrils daily. 10/19/19   Lorelee New, PA-C  hydrOXYzine (ATARAX/VISTARIL) 25 MG tablet TAKE 1 TABLET (25 MG TOTAL) BY MOUTH EVERY 6 (SIX) HOURS AS NEEDED FOR ANXIETY. 10/30/19   Marcine Matar, MD  levonorgestrel (MIRENA) 20 MCG/24HR IUD 1 each by Intrauterine route once.    [provider]  methocarbamol (ROBAXIN) 500 MG tablet Take 1 tablet (500 mg total) by mouth 2 (two) times daily. Patient not taking: Reported on 11/13/2019 10/23/19   Vitaly Wanat, Pedro Earls A, PA-C  Multiple Vitamin (MULTIVITAMIN WITH MINERALS) TABS tablet Take 1 tablet by mouth daily.    [provider]  omeprazole (PRILOSEC) 20 MG capsule Take 1 capsule (20 mg total) by mouth daily. 11/29/19 12/29/19  Katesha Eichel A, PA-C  ondansetron (ZOFRAN) 4 MG tablet Take 1 tablet (4 mg total) by mouth daily as needed for nausea or vomiting. 09/18/19   Marcine Matar, MD  predniSONE (DELTASONE) 20 MG tablet Take 2 pills daily x2 days, 1 pill daily x2 days, then half pill daily x4 days.  Take after eating 11/13/19   Fulp, Cammie, MD  sucralfate (CARAFATE) 1 g tablet Take 1 tablet (1 g total) by mouth 4 (four) times daily -  with meals and at bedtime. 11/29/19 12/29/19  Kacey Vicuna A, PA-C  SUMAtriptan (IMITREX) 50 MG tablet Take 1 tablet (50 mg total) at the start of headache. May repeat 1 tablet (50 mg total) in 2 hours if headache persists or recurs. Maximum of 2 pills per 24 hours. Patient not taking: Reported on 10/21/2019 10/20/19   Rema Fendt, NP  topiramate (TOPAMAX) 50 MG tablet Take 1 tablet (50 mg total) by mouth at bedtime. 10/21/19   Anson Fret, MD  triamcinolone cream (KENALOG) 0.1 % Apply 1 application topically 2 (two) times daily as needed. Patient not taking: Reported on 11/13/2019 09/18/19   Marcine Matar, MD    Allergies    Sertraline  Review of Systems   Review of Systems  Constitutional: Negative for activity change, chills and fever.  Respiratory: Negative for cough, shortness of breath and wheezing.   Cardiovascular: Negative for chest pain, palpitations and leg swelling.  Gastrointestinal: Positive for abdominal pain and nausea. Negative for anal bleeding, blood in stool, constipation, diarrhea and vomiting.       Dry heaves  Genitourinary: Negative for dysuria, flank pain,  frequency, hematuria, urgency, vaginal bleeding, vaginal discharge and vaginal pain.  Musculoskeletal: Negative for back pain, gait problem, myalgias and neck pain.  Skin: Negative for rash and wound.  Allergic/Immunologic: Negative for immunocompromised state.  Neurological: Negative for seizures, syncope, weakness, numbness and headaches.  Psychiatric/Behavioral: Negative for confusion.    Physical Exam Updated Vital Signs BP 124/82    Pulse (!) 56    Temp 97.9 F (36.6 C) (Oral)    LMP 11/13/2019    SpO2 100%   Physical Exam Vitals and nursing note reviewed.  Constitutional:      General: She is not in acute distress.    Appearance: She is obese.  HENT:     Head: Normocephalic.  Eyes:     Conjunctiva/sclera: Conjunctivae normal.  Cardiovascular:     Rate and Rhythm: Normal rate and regular rhythm.     Pulses: Normal pulses.     Heart sounds: Normal heart sounds. No murmur. No friction rub. No gallop.   Pulmonary:     Effort: Pulmonary effort is normal. No respiratory distress.     Breath sounds: No stridor. No wheezing, rhonchi or rales.  Chest:     Chest wall: No tenderness.  Abdominal:     General: There is no distension.     Palpations: Abdomen is soft. There is no mass.     Tenderness: There is abdominal tenderness. There is no right CVA tenderness, left CVA tenderness, guarding or rebound.     Hernia: No hernia is present.     Comments: Tender to palpation in the epigastric region.  No rebound or guarding.  Abdomen soft, obese, nondistended.  Normoactive bowel sounds.  No CVA tenderness bilaterally.  Negative Murphy sign.  No tenderness to McBurney's point.  Musculoskeletal:     Cervical back: Neck supple.     Right lower leg: No edema.     Left lower leg: No edema.  Skin:    General: Skin is warm.     Findings: No rash.  Neurological:     Mental Status: She is alert.  Psychiatric:        Behavior: Behavior normal.     ED Results / Procedures / Treatments    Labs (all labs ordered are listed, but only abnormal results are displayed) Labs Reviewed  LIPASE, BLOOD  COMPREHENSIVE METABOLIC PANEL  CBC  URINALYSIS, ROUTINE W REFLEX MICROSCOPIC  PREGNANCY, URINE    EKG None  Radiology No results found.  Procedures Procedures (including critical care time)  Medications Ordered in ED Medications  sodium chloride flush (NS) 0.9 % injection 3 mL (3 mLs Intravenous Given 11/29/19 0549)  dicyclomine (BENTYL) capsule 10 mg (10 mg Oral Given 11/29/19 0548)  sucralfate (CARAFATE) tablet 1 g (1 g Oral Given 11/29/19 0548)  famotidine (PEPCID) IVPB 20 mg premix (0 mg Intravenous Stopped 11/29/19 0618)  alum & mag hydroxide-simeth (MAALOX/MYLANTA) 200-200-20 MG/5ML suspension 30 mL (30 mLs Oral Given 11/29/19 1700)    And  lidocaine (XYLOCAINE) 2 % viscous mouth solution 15 mL (15 mLs Oral Given 11/29/19 1749)    ED Course  I have reviewed the triage vital signs and the nursing notes.  Pertinent labs & imaging results that were available during my care of the patient were reviewed by me and considered in my medical decision making (see chart for details).    MDM Rules/Calculators/A&P                      39 year old female with a history of tic disorder, generalized anxiety disorder, asthma, migraines who presents to the emergency department for her fourth visit in the last 5 weeks.  Today, she presents with burning epigastric pain.  She has had a history of similar, but pain is more severe since onset over the last few days.  She reports dry heaves approximately 2-3 times per day that began earlier this week as well.  No other associated symptoms.  She is established with neurology, PCP.  She has a pending outpatient follow-up to ENT and gastroenterology.   On exam, she has focal tenderness palpation in the epigastric region without rebound or guarding.  She has had no dry heaving over the last 8 hours since arrival in the ER.  I did review  her  previous chart.  She has previously been seen for similar pain and was given a GI cocktail with improvement.  I do question H. pylori versus peptic ulcer disease versus gastritis.  Will order basic labs and symptomatic treatment with Carafate, Pepcid, and Bentyl.  Labs are unremarkable.  Patient reports improvement in pain with medication.  She does report that is mildly lingering, but we will treat this with a GI cocktail.  Of low suspicion for choledocholithiasis, cholecystitis, pancreatitis, ruptured peptic ulcer disease, diverticulitis, PID, URI, atypical chest pain, ACS.  I have also reviewed previous imaging that was ordered during previous ER visits.  At this time, I feel that no further urgent or emergent work-up is indicated.  I have encouraged the patient to call and get on the cancellation list for GI.  All questions answered.  We will sent home with Carafate and omeprazole.  She is hemodynamically stable to no acute distress.  Safe for discharge home with outpatient follow-up as acute.   Final Clinical Impression(s) / ED Diagnoses Final diagnoses:  Epigastric pain    Rx / DC Orders ED Discharge Orders         Ordered    omeprazole (PRILOSEC) 20 MG capsule  Daily     11/29/19 0636    sucralfate (CARAFATE) 1 g tablet  3 times daily with meals & bedtime     11/29/19 0636           Jakelyn Squyres A, PA-C 11/29/19 [id number]    Zadie Rhine, MD 11/30/19 0403

## 2019-11-29 NOTE — Discharge Instructions (Addendum)
Thank you for allowing me to care for you today in the Emergency Department.   Keep your follow-up appoint with gastroenterology.  You can also call their office to see if he can be added to a wait list in case someone cancels their appointment.  Take 1 tablet of omeprazole daily until you are seen by gastroenterology.  You can also take 1 tablet of Carafate every 8 hours.  Try to avoid eating at least 3 hours before you go to sleep at night.  This may make your symptoms worse.  Try to elevate yourself in a 45 degree angle when you are sleeping to see if this will improve your symptoms.  Return to the emergency department if you develop black or bloody stools or vomiting, high fevers with severe abdominal pain, uncontrollable vomiting, or other new, concerning symptoms.

## 2019-11-29 NOTE — ED Notes (Signed)
Pt sitting up in bed. NAD noted. Pt ready for d/c 

## 2019-12-05 ENCOUNTER — Encounter: Payer: Self-pay | Admitting: Internal Medicine

## 2019-12-06 ENCOUNTER — Other Ambulatory Visit: Payer: Self-pay | Admitting: Internal Medicine

## 2019-12-06 DIAGNOSIS — R519 Headache, unspecified: Secondary | ICD-10-CM

## 2019-12-06 DIAGNOSIS — F959 Tic disorder, unspecified: Secondary | ICD-10-CM

## 2019-12-22 ENCOUNTER — Telehealth (HOSPITAL_COMMUNITY): Payer: Self-pay | Admitting: Psychiatry

## 2019-12-24 ENCOUNTER — Ambulatory Visit: Payer: Medicaid Other | Admitting: Physician Assistant

## 2019-12-25 ENCOUNTER — Encounter: Payer: Self-pay | Admitting: Physician Assistant

## 2019-12-25 ENCOUNTER — Ambulatory Visit (INDEPENDENT_AMBULATORY_CARE_PROVIDER_SITE_OTHER): Payer: Medicaid Other | Admitting: Physician Assistant

## 2019-12-25 VITALS — BP 100/68 | HR 84 | Ht 68.25 in | Wt 267.1 lb

## 2019-12-25 DIAGNOSIS — R12 Heartburn: Secondary | ICD-10-CM

## 2019-12-25 DIAGNOSIS — K219 Gastro-esophageal reflux disease without esophagitis: Secondary | ICD-10-CM | POA: Diagnosis not present

## 2019-12-25 DIAGNOSIS — R1013 Epigastric pain: Secondary | ICD-10-CM | POA: Diagnosis not present

## 2019-12-25 DIAGNOSIS — R1110 Vomiting, unspecified: Secondary | ICD-10-CM | POA: Diagnosis not present

## 2019-12-25 MED ORDER — OMEPRAZOLE 40 MG PO CPDR: 40.0000 mg | DELAYED_RELEASE_CAPSULE | Freq: Two times a day (BID) | ORAL | 2 refills | Status: DC

## 2019-12-25 NOTE — Patient Instructions (Addendum)
We have sent the following medications to your pharmacy for you to pick up at your convenience: Omeprazole 40mg  capsules: Take one capsule 30-60 minutes before breakfast and dinner.  MYCHART in 2 weeks if not better.  Stop the carafate.  Please come back and see [place] PA-C in 4-6 weeks.    I appreciate the opportunity to care for you. Hyacinth Meeker, PA-C

## 2019-12-25 NOTE — Progress Notes (Signed)
Chief Complaint: Heartburn, Belching and Nausea  HPI:    Amy Mendoza is a 39 year old African-American female with a past medical history of reflux, known to Dr. Ardis Hughs, who was referred to me by Ladell Pier, MD for a complaint of heartburn, belching and nausea.     08/20/2019 CT abdomen pelvis with contrast with sigmoid diverticulosis and mild focal perisigmoid stranding which may be chronic/scarring, mild acute diverticulitis not excluded.  Patient been seen in the ER that day and was given Augmentin.    11/29/2019 CMP normal, CBC normal.    12/20/2019 patient seen in the ED in [facility] with worsening epigastric pain, described a history of GERD on omeprazole and Carafate.  At that time diagnosed with gastritis.    Today, the patient presents to clinic and tells me that she had Covid back in December and she was in the hospital for a week, it seems like a month after that everything started going "haywire".  She tells me that she was in the ER about 2 times a week every week until about a month ago with various issues.  (These visits are all documented in her chart).  Today her primary complaint is that she has been having dry heaving/abdominal spasming since February as well as a burning sensation in her epigastrium intermittently, "not even every day", as well as some reflux and epigastric pain which is also not there every day.  Tells me that she has been trying to stay on mostly bland foods but does occasionally have fast food and tries to avoid sodas.  Associated symptoms include an increase in belching as well as nausea.  Currently she is taking Omeprazole 20 mg as needed and Carafate 1 G as needed (typically once or twice a day).    Denies fever, chills, change in bowel habits, vomiting or symptoms that awaken her from sleep.  Past Medical History:  Diagnosis Date  . Anxiety   . Asthma   . Chronic headaches   . Gallstones   . Gastritis   . GERD (gastroesophageal reflux disease)   .  Hyperlipemia   . Kidney stones     Past Surgical History:  Procedure Laterality Date  . CHOLECYSTECTOMY  feb 2006  . KNEE SURGERY     left    Current Outpatient Medications  Medication Sig Dispense Refill  . fluticasone (FLONASE) 50 MCG/ACT nasal spray Place 2 sprays into both nostrils daily. 9.9 mL 2  . omeprazole (PRILOSEC) 20 MG capsule Take 1 capsule (20 mg total) by mouth daily. 30 capsule 0  . ondansetron (ZOFRAN) 4 MG tablet Take 1 tablet (4 mg total) by mouth daily as needed for nausea or vomiting. 30 tablet 1  . sucralfate (CARAFATE) 1 g tablet Take 1 tablet (1 g total) by mouth 4 (four) times daily -  with meals and at bedtime. 120 tablet 0   No current facility-administered medications for this visit.    Allergies as of 12/25/2019 - Review Complete 12/25/2019  Allergen Reaction Noted  . Zoloft [sertraline]  10/21/2019    Family History  Problem Relation Age of Onset  . Diabetes Father   . Heart failure Father   . Heart attack Father   . Prostate cancer Maternal Grandfather   . Pancreatic cancer Maternal Grandfather   . Breast cancer Maternal Aunt   . Ovarian cancer Mother   . Diabetes Maternal Grandmother   . Kidney Stones Maternal Grandmother   . Heart attack Paternal Grandfather   .  Heart attack Paternal Aunt   . Diabetes Maternal Uncle   . Asthma Son   . Colon cancer Neg Hx   . Tics Neg Hx   . Migraines Neg Hx     Social History   Socioeconomic History  . Marital status: Single    Spouse name: Not on file  . Number of children: 1  . Years of education: Not on file  . Highest education level: Associate degree: occupational, Scientist, product/process development, or vocational program  Occupational History  . Occupation: LPN  Tobacco Use  . Smoking status: Never Smoker  . Smokeless tobacco: Never Used  Vaping Use  . Vaping Use: Former  Substance and Sexual Activity  . Alcohol use: Yes    Comment: occasionally-social  . Drug use: Not Currently    Types: Marijuana     Comment: tried marijuana once in an edible but it made her freak out.   Marland Kitchen Sexual activity: Yes    Birth control/protection: I.U.D.  Other Topics Concern  . Not on file  Social History Narrative   Lives with her mother   Right handed   Caffeine: none    Social Determinants of Health   Financial Resource Strain:   . Difficulty of Paying Living Expenses:   Food Insecurity:   . Worried About Programme researcher, broadcasting/film/video in the Last Year:   . Barista in the Last Year:   Transportation Needs:   . Freight forwarder (Medical):   Marland Kitchen Lack of Transportation (Non-Medical):   Physical Activity:   . Days of Exercise per Week:   . Minutes of Exercise per Session:   Stress:   . Feeling of Stress :   Social Connections:   . Frequency of Communication with Friends and Family:   . Frequency of Social Gatherings with Friends and Family:   . Attends Religious Services:   . Active Member of Clubs or Organizations:   . Attends Banker Meetings:   Marland Kitchen Marital Status:   Intimate Partner Violence:   . Fear of Current or Ex-Partner:   . Emotionally Abused:   Marland Kitchen Physically Abused:   . Sexually Abused:     Review of Systems:    Constitutional: No weight loss, fever or chills Skin: No rash  Cardiovascular: No chest pain Respiratory: No SOB  Gastrointestinal: See HPI and otherwise negative Genitourinary: No dysuria  Neurological: No headache, dizziness or syncope Musculoskeletal: No new muscle or joint pain Hematologic: No bleeding  Psychiatric: No history of depression or anxiety   Physical Exam:  Vital signs: BP 100/68 (BP Location: Left Arm, Patient Position: Sitting, Cuff Size: Large)   Pulse 84   Ht 5' 8.25" (1.734 m) Comment: height measured without shoes  Wt 267 lb 2 oz (121.2 kg)   LMP 12/10/2019   BMI 40.32 kg/m   Constitutional:   Pleasant Obese AA female appears to be in NAD, Well developed, Well nourished, alert and cooperative (dry heaving randomly throughout  exam) Head:  Normocephalic and atraumatic. Eyes:   PEERL, EOMI. No icterus. Conjunctiva pink. Ears:  Normal auditory acuity. Neck:  Supple Throat: Oral cavity and pharynx without inflammation, swelling or lesion.  Respiratory: Respirations even and unlabored. Lungs clear to auscultation bilaterally.   No wheezes, crackles, or rhonchi.  Cardiovascular: Normal S1, S2. No MRG. Regular rate and rhythm. No peripheral edema, cyanosis or pallor.  Gastrointestinal:  Soft, nondistended, mild epigastric ttp No rebound or guarding. Normal bowel sounds. No appreciable masses or  hepatomegaly. Rectal:  Not performed.  Msk:  Symmetrical without gross deformities. Without edema, no deformity or joint abnormality.  Neurologic:  Alert and  oriented x4;  grossly normal neurologically.  Skin:   Dry and intact without significant lesions or rashes. Psychiatric:  Demonstrates good judgement and reason without abnormal affect or behaviors.  RELEVANT LABS AND IMAGING: CBC    Component Value Date/Time   WBC 8.2 11/29/2019 0455   RBC 4.75 11/29/2019 0455   HGB 13.1 11/29/2019 0455   HCT 41.9 11/29/2019 0455   PLT 249 11/29/2019 0455   MCV 88.2 11/29/2019 0455   MCH 27.6 11/29/2019 0455   MCHC 31.3 11/29/2019 0455   RDW 13.1 11/29/2019 0455   LYMPHSABS 3.3 10/13/2019 2126   MONOABS 0.5 10/13/2019 2126   EOSABS 0.3 10/13/2019 2126   BASOSABS 0.1 10/13/2019 2126    CMP     Component Value Date/Time   NA 138 11/29/2019 0455   NA 142 09/18/2019 1605   K 4.1 11/29/2019 0455   CL 107 11/29/2019 0455   CO2 24 11/29/2019 0455   GLUCOSE 86 11/29/2019 0455   BUN 8 11/29/2019 0455   BUN 7 09/18/2019 1605   CREATININE 0.80 11/29/2019 0455   CREATININE 0.84 02/07/2016 1719   CALCIUM 9.2 11/29/2019 0455   PROT 7.9 11/29/2019 0455   ALBUMIN 4.1 11/29/2019 0455   AST 21 11/29/2019 0455   ALT 23 11/29/2019 0455   ALKPHOS 55 11/29/2019 0455   BILITOT 0.8 11/29/2019 0455   GFRNONAA >60 11/29/2019 0455    GFRAA >60 11/29/2019 0455    Assessment: 1.  Epigastric pain: Episodically per the patient, no better with Omeprazole 20 mg as needed and Carafate 1-2 times a day over the past few months, CT in February with no etiology for upper GI symptoms, recent labs including CBC and CMP in May were normal; consider most likely gastritis versus PUD versus functional 2.  Dry heaving: Patient makes an audible heaving sound occasionally throughout exam; consider abdominal spasm versus other 3.  Belching 4.  Heartburn/GERD  Plan: 1.  Increased Omeprazole to 40 mg twice daily, 30 to 60 minutes before breakfast and dinner #60 with 5 refills. 2.  Reviewed antireflux diet and lifestyle modifications. 3.  Would recommend the patient discontinue Carafate.  She can continue Zofran as needed 4.  Discussed with the patient that she should contact us with an update in 2 weeks to let me know how she is doing.  If no better would recommend an EGD for further eval. 5.  Could consider Dicyclomine/ other antispasmodic in the future if work-up is negative/normal. 6.  Patient will return to clinic with me in 4 to 6 weeks or sooner if necessary.  Hyacinth Meeker, PA-C [facility] 12/25/2019, 8:52 AM  Cc: Marcine Matar, MD

## 2019-12-25 NOTE — Progress Notes (Signed)
I agree with the above note, plan 

## 2020-01-05 ENCOUNTER — Encounter (HOSPITAL_COMMUNITY): Payer: Self-pay | Admitting: Physician Assistant

## 2020-01-12 ENCOUNTER — Other Ambulatory Visit: Payer: Self-pay

## 2020-01-12 ENCOUNTER — Encounter: Payer: Self-pay | Admitting: Gastroenterology

## 2020-01-12 ENCOUNTER — Telehealth: Payer: Self-pay

## 2020-01-12 MED ORDER — DICYCLOMINE HCL 10 MG PO CAPS: 10.0000 mg | ORAL_CAPSULE | Freq: Four times a day (QID) | ORAL | 0 refills | Status: DC

## 2020-01-12 NOTE — Telephone Encounter (Signed)
Prescription sent to the pharmacy and egd scheduled. Pt aware

## 2020-01-12 NOTE — Telephone Encounter (Signed)
-----   Message from [facility] [place], [place] sent at 01/12/2020  8:31 AM EDT ----- Regarding: Meds and EGD Hello,    Can you please send this patient in Dicyclomine 10 mg 4 times daily, 20-30 minutes before meals and at bedtime #120 with 1 refill.   Can you also set her up for an EGD with Dr. Christella Hartigan in the [facility] for evaluation of epigastric pain.  Thanks, Hyacinth Meeker, PA-C

## 2020-01-13 DIAGNOSIS — G8929 Other chronic pain: Secondary | ICD-10-CM | POA: Insufficient documentation

## 2020-01-13 DIAGNOSIS — M26623 Arthralgia of bilateral temporomandibular joint: Secondary | ICD-10-CM | POA: Insufficient documentation

## 2020-01-21 ENCOUNTER — Encounter: Payer: Self-pay | Admitting: Family Medicine

## 2020-01-21 ENCOUNTER — Ambulatory Visit (INDEPENDENT_AMBULATORY_CARE_PROVIDER_SITE_OTHER): Payer: BLUE CROSS/BLUE SHIELD | Admitting: Family Medicine

## 2020-01-21 VITALS — BP 108/73 | HR 85 | Ht 68.5 in | Wt 268.0 lb

## 2020-01-21 DIAGNOSIS — R519 Headache, unspecified: Secondary | ICD-10-CM

## 2020-01-21 DIAGNOSIS — F959 Tic disorder, unspecified: Secondary | ICD-10-CM | POA: Diagnosis not present

## 2020-01-21 DIAGNOSIS — R6884 Jaw pain: Secondary | ICD-10-CM

## 2020-01-21 DIAGNOSIS — F419 Anxiety disorder, unspecified: Secondary | ICD-10-CM

## 2020-01-21 MED ORDER — TIZANIDINE HCL 2 MG PO CAPS: 2.0000 mg | ORAL_CAPSULE | Freq: Three times a day (TID) | ORAL | 2 refills | Status: DC | PRN

## 2020-01-21 NOTE — Patient Instructions (Signed)
We will try tizanidine 2mg  as needed for jaw pain. I am concerned you could have jaw pain. These events do not sound like migraines. You can use Imitrex if you have migraine symptoms as discussed.   Continue close follow up with dentistry, PCP and GI. Let know how your visit goes with psychiatry.   Follow up in 6 months   Migraine Headache A migraine headache is a very strong throbbing pain on one side or both sides of your head. This type of headache can also cause other symptoms. It can last from 4 hours to 3 days. Talk with your doctor about what things may bring on (trigger) this condition. What are the causes? The exact cause of this condition is not known. This condition may be triggered or caused by:  Drinking alcohol.  Smoking.  Taking medicines, such as: ? Medicine used to treat chest pain (nitroglycerin). ? Birth control pills. ? Estrogen. ? Some blood pressure medicines.  Eating or drinking certain products.  Doing physical activity. Other things that may trigger a migraine headache include:  Having a menstrual period.  Pregnancy.  Hunger.  Stress.  Not getting enough sleep or getting too much sleep.  Weather changes.  Tiredness (fatigue). What increases the risk?  Being 65-43 years old.  Being female.  Having a family history of migraine headaches.  Being Caucasian.  Having depression or anxiety.  Being very overweight. What are the signs or symptoms?  A throbbing pain. This pain may: ? Happen in any area of the head, such as on one side or both sides. ? Make it hard to do daily activities. ? Get worse with physical activity. ? Get worse around bright lights or loud noises.  Other symptoms may include: ? Feeling sick to your stomach (nauseous). ? Vomiting. ? Dizziness. ? Being sensitive to bright lights, loud noises, or smells.  Before you get a migraine headache, you may get warning signs (an aura). An aura may include: ? Seeing  flashing lights or having blind spots. ? Seeing bright spots, halos, or zigzag lines. ? Having tunnel vision or blurred vision. ? Having numbness or a tingling feeling. ? Having trouble talking. ? Having weak muscles.  Some people have symptoms after a migraine headache (postdromal phase), such as: ? Tiredness. ? Trouble thinking (concentrating). How is this treated?  Taking medicines that: ? Relieve pain. ? Relieve the feeling of being sick to your stomach. ? Prevent migraine headaches.  Treatment may also include: ? Having acupuncture. ? Avoiding foods that bring on migraine headaches. ? Learning ways to control your body functions (biofeedback). ? Therapy to help you know and deal with negative thoughts (cognitive behavioral therapy). Follow these instructions at home: Medicines  Take over-the-counter and prescription medicines only as told by your doctor.  Ask your doctor if the medicine prescribed to you: ? Requires you to avoid driving or using heavy machinery. ? Can cause trouble pooping (constipation). You may need to take these steps to prevent or treat trouble pooping:  Drink enough fluid to keep your pee (urine) pale yellow.  Take over-the-counter or prescription medicines.  Eat foods that are high in fiber. These include beans, whole grains, and fresh fruits and vegetables.  Limit foods that are high in fat and sugar. These include fried or sweet foods. Lifestyle  Do not drink alcohol.  Do not use any products that contain nicotine or tobacco, such as cigarettes, e-cigarettes, and chewing tobacco. If you need help quitting,  ask your doctor.  Get at least 8 hours of sleep every night.  Limit and deal with stress. General instructions      Keep a journal to find out what may bring on your migraine headaches. For example, write down: ? What you eat and drink. ? How much sleep you get. ? Any change in what you eat or drink. ? Any change in your  medicines.  If you have a migraine headache: ? Avoid things that make your symptoms worse, such as bright lights. ? It may help to lie down in a dark, quiet room. ? Do not drive or use heavy machinery. ? Ask your doctor what activities are safe for you.  Keep all follow-up visits as told by your doctor. This is important. Contact a doctor if:  You get a migraine headache that is different or worse than others you have had.  You have more than 15 headache days in one month. Get help right away if:  Your migraine headache gets very bad.  Your migraine headache lasts longer than 72 hours.  You have a fever.  You have a stiff neck.  You have trouble seeing.  Your muscles feel weak or like you cannot control them.  You start to lose your balance a lot.  You start to have trouble walking.  You pass out (faint).  You have a seizure. Summary  A migraine headache is a very strong throbbing pain on one side or both sides of your head. These headaches can also cause other symptoms.  This condition may be treated with medicines and changes to your lifestyle.  Keep a journal to find out what may bring on your migraine headaches.  Contact a doctor if you get a migraine headache that is different or worse than others you have had.  Contact your doctor if you have more than 15 headache days in a month. This information is not intended to replace advice given to you by your health care provider. Make sure you discuss any questions you have with your health care provider. Document Revised: 10/25/2018 Document Reviewed: 08/15/2018 Elsevier Patient Education  2020 Elsevier Inc.   Temporomandibular Joint Syndrome  Temporomandibular joint syndrome (TMJ syndrome) is a condition that causes pain in the temporomandibular joints. These joints are located near your ears and allow your jaw to open and close. For people with TMJ syndrome, chewing, biting, or other movements of the jaw can be  difficult or painful. TMJ syndrome is often mild and goes away within a few weeks. However, sometimes the condition becomes a long-term (chronic) problem. What are the causes? This condition may be caused by:  Grinding your teeth or clenching your jaw. Some people do this when they are under stress.  Arthritis.  Injury to the jaw.  Head or neck injury.  Teeth or dentures that are not aligned well. In some cases, the cause of TMJ syndrome may not be known. What are the signs or symptoms? The most common symptom of this condition is an aching pain on the side of the head in the area of the TMJ. Other symptoms may include:  Pain when moving your jaw, such as when chewing or biting.  Being unable to open your jaw all the way.  Making a clicking sound when you open your mouth.  Headache.  Earache.  Neck or shoulder pain. How is this diagnosed? This condition may be diagnosed based on:  Your symptoms and medical history.  A physical exam.  Your health care provider may check the range of motion of your jaw.  Imaging tests, such as X-rays or an MRI. You may also need to see your dentist, who will determine if your teeth and jaw are lined up correctly. How is this treated? TMJ syndrome often goes away on its own. If treatment is needed, the options may include:  Eating soft foods and applying ice or heat.  Medicines to relieve pain or inflammation.  Medicines or massage to relax the muscles.  A splint, bite plate, or mouthpiece to prevent teeth grinding or jaw clenching.  Relaxation techniques or counseling to help reduce stress.  A therapy for pain in which an electrical current is applied to the nerves through the skin (transcutaneous electrical nerve stimulation).  Acupuncture. This is sometimes helpful to relieve pain.  Jaw surgery. This is rarely needed. Follow these instructions at home:  Eating and drinking  Eat a soft diet if you are having trouble  chewing.  Avoid foods that require a lot of chewing. Do not chew gum. General instructions  Take over-the-counter and prescription medicines only as told by your health care provider.  If directed, put ice on the painful area. ? Put ice in a plastic bag. ? Place a towel between your skin and the bag. ? Leave the ice on for 20 minutes, 2-3 times a day.  Apply a warm, wet cloth (warm compress) to the painful area as directed.  Massage your jaw area and do any jaw stretching exercises as told by your health care provider.  If you were given a splint, bite plate, or mouthpiece, wear it as told by your health care provider.  Keep all follow-up visits as told by your health care provider. This is important. Contact a health care provider if:  You are having trouble eating.  You have new or worsening symptoms. Get help right away if:  Your jaw locks open or closed. Summary  Temporomandibular joint syndrome (TMJ syndrome) is a condition that causes pain in the temporomandibular joints. These joints are located near your ears and allow your jaw to open and close.  TMJ syndrome is often mild and goes away within a few weeks. However, sometimes the condition becomes a long-term (chronic) problem.  Symptoms include an aching pain on the side of the head in the area of the TMJ, pain when chewing or biting, and being unable to open your jaw all the way. You may also make a clicking sound when you open your mouth.  TMJ syndrome often goes away on its own. If treatment is needed, it may include medicines to relieve pain, reduce inflammation, or relax the muscles. A splint, bite plate, or mouthpiece may also be used to prevent teeth grinding or jaw clenching. This information is not intended to replace advice given to you by your health care provider. Make sure you discuss any questions you have with your health care provider. Document Revised: 09/14/2017 Document Reviewed: 08/14/2017 Elsevier  Patient Education  2020 [employer].

## 2020-01-21 NOTE — Progress Notes (Addendum)
PATIENT: Amy OgrenLorraine L Stokke DOB: 03/28/1981  REASON FOR VISIT: follow up HISTORY FROM: patient  Chief Complaint  Patient presents with  . Follow-up    rm 2, alone, migraine, Pt states migraines have been worse, recently seen by ENT, 2-3 migraines a week OTC drugs help some  . Migraine     HISTORY OF PRESENT ILLNESS: Today 01/22/20 Amy Mendoza is a 39 y.o. female here today for follow up. She reports that twitching has improved significantly. She reports a "vocalization" when she has a headache or stomach pains. She describes a moan or groan that "comes out different with one pain and a different way with the other". She has not had any migrainous headaches. She describes a tension type pain. Pain is bilateral in the cheek and jaw area. She reports significant jaw pain after work. 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 She has taken OTC allergy medications that have helped. She is seeing GI for stomach aches. She was started on Bentyl but not sure if it is helping. She has an appt with psychiatry this Saturday. She does not feel symptoms are related to anxiety.   HISTORY: (copied from Dr Lucia GaskinsAhern note on 10/21/2019)   HPI:  Amy OgrenLorraine L Foye is a 39 y.o. female here as requested by Marcine MatarJohnson, Deborah B, MD for tic disorder. PMHx anxiety, obesity. Reviewed Dr. Henriette CombsJohnson's notes: she developed body twitching in early January, the twitching comes "in waves" some days no twitching at all, she stopped drinking caffeine. She was started on zoloft. I reviewed ED notes: She presented to the ED 3/9 for persistent tics and anxiety disorder, tics controlled with cognitive behavioral therapy and breathing exercises and they thought she would benefit from behavioral therapy. She also presented 3/24 for migraine to the ER and then again on 3/29. She was in the ED 3/31 as well for migraine, facial pressure, chest pains, grunting and clearing her throat during exam which she describes as  "fits". She then presented to the ER again 10/19/19 for facial pressure and pain, taking ketorolac and reglan, and also stated that many symptoms likely due to anxiety, she had maxillary tenderness and dxed with a sinus headache and told to use flonase, antihistamines and nasal rinses. She has also [facility] the ED [facility] as well on 3/30 with similar headache symptoms, diagnosed with acute tension-type headache and motor and vocal tics, exam was normal per notes. She has also been evaluated by cardiology for palpitations in the past per review of records as well.   She is here alone. We discussed due to limited time we will focus on migraines today, I will address tic disorder briefly but patient should really be seen in psychiatry for this disorder as they can provide cognitive behavioral therapy or other therapy and can also treat with medication. She has a hx of migraines around menses for many years episodic but after covid started having significant headaches and pressure in the head and face, she had Covid in Dece,[place] and was hospitalized, she was home on Dec 22nd, the following week she was feeling better but still with significant fatigue. January 8th she went to the emergency room, she had chest pain, felt like she was dying, they thought she was having an anxiety atack she had multiple visits for this. In February headaches worsened, has progressively gotten worse and she went to the ED many times for headache as well. She feels like electric shocks in  the head, pressure in the head severe, worst headaches of life and "brain zaps". Sensory changes left side of the face, light and sound sensitivity. Working made it worse. Headaches/migraines can last days, going into a dark room helps, pulsating/pounding/throbbing, pressure in the face and jaw, positional worse with bending over, blurry vision. She is having the headaches every day. Shooting pain through the nose. Headaches are throbbing and all over, can be  unilateral in the temples also in the back of the head. Never been on migraine medications in the past. She says her tics are in arms and shoulders, feels like it does it on its own, jerking movements, she makes vocalizations.   Reviewed notes, labs and imaging from outside physicians, which showed: see above, cbc and cmp unremarkable 10/13/2019  REVIEW OF SYSTEMS: Out of a complete 14 system review of symptoms, the patient complains only of the following symptoms, jaw pain, tension headaches, stomach aches, anxiety and all other reviewed systems are negative.  ALLERGIES: Allergies  Allergen Reactions  . Zoloft [Sertraline]     Pt states it made her crazy/worsened anxiety.     HOME MEDICATIONS: Outpatient Medications Prior to Visit  Medication Sig Dispense Refill  . dicyclomine (BENTYL) 10 MG capsule Take 1 capsule (10 mg total) by mouth in the morning, at noon, in the evening, and at bedtime. 120 capsule 0  . fluticasone (FLONASE) 50 MCG/ACT nasal spray Place 2 sprays into both nostrils daily. 9.9 mL 2  . omeprazole (PRILOSEC) 40 MG capsule Take 1 capsule (40 mg total) by mouth in the morning and at bedtime. 60 capsule 2  . ondansetron (ZOFRAN) 4 MG tablet Take 1 tablet (4 mg total) by mouth daily as needed for nausea or vomiting. 30 tablet 1   No facility-administered medications prior to visit.    PAST MEDICAL HISTORY: Past Medical History:  Diagnosis Date  . Anxiety   . Asthma   . Chronic headaches   . COVID-19   . Gallstones   . Gastritis   . GERD (gastroesophageal reflux disease)   . Hyperlipemia   . Kidney stones     PAST SURGICAL HISTORY: Past Surgical History:  Procedure Laterality Date  . CHOLECYSTECTOMY  feb 2006  . KNEE SURGERY Left     FAMILY HISTORY: Family History  Problem Relation Age of Onset  . Diabetes Father   . Heart failure Father   . Heart attack Father   . Prostate cancer Maternal Grandfather   . Pancreatic cancer Maternal Grandfather   .  Breast cancer Maternal Aunt   . Ovarian cancer Mother   . Diabetes Maternal Grandmother   . Kidney Stones Maternal Grandmother   . Heart attack Paternal Grandfather   . Heart attack Paternal Aunt   . Diabetes Maternal Uncle   . Asthma Son   . Colon cancer Neg Hx   . Tics Neg Hx   . Migraines Neg Hx     SOCIAL HISTORY: Social History   Socioeconomic History  . Marital status: Single    Spouse name: Not on file  . Number of children: 1  . Years of education: Not on file  . Highest education level: Associate degree: occupational, Scientist, product/process development, or vocational program  Occupational History  . Occupation: LPN  Tobacco Use  . Smoking status: Never Smoker  . Smokeless tobacco: Never Used  Vaping Use  . Vaping Use: Former  Substance and Sexual Activity  . Alcohol use: Yes    Comment: occasionally-social  .  Drug use: Not Currently    Types: Marijuana    Comment: tried marijuana once in an edible but it made her freak out.   Marland Kitchen Sexual activity: Yes    Birth control/protection: I.U.D.  Other Topics Concern  . Not on file  Social History Narrative   Lives with her mother   Right handed   Caffeine: none    Social Determinants of Health   Financial Resource Strain:   . Difficulty of Paying Living Expenses:   Food Insecurity:   . Worried About Programme researcher, broadcasting/film/video in the Last Year:   . Barista in the Last Year:   Transportation Needs:   . Freight forwarder (Medical):   Marland Kitchen Lack of Transportation (Non-Medical):   Physical Activity:   . Days of Exercise per Week:   . Minutes of Exercise per Session:   Stress:   . Feeling of Stress :   Social Connections:   . Frequency of Communication with Friends and Family:   . Frequency of Social Gatherings with Friends and Family:   . Attends Religious Services:   . Active Member of Clubs or Organizations:   . Attends Banker Meetings:   Marland Kitchen Marital Status:   Intimate Partner Violence:   . Fear of Current or  Ex-Partner:   . Emotionally Abused:   Marland Kitchen Physically Abused:   . Sexually Abused:       PHYSICAL EXAM  Vitals:   01/21/20 1523  BP: 108/73  Pulse: 85  Weight: 268 lb (121.6 kg)  Height: 5' 8.5" (1.74 m)   Body mass index is 40.16 kg/m.  Generalized: Well developed, in no acute distress  Cardiology: normal rate and rhythm, no murmur noted Respiratory: clear to auscultation bilaterally  Neurological examination  Mentation: Alert oriented to time, place, history taking. Follows all commands speech and language fluent Cranial nerve II-XII: Pupils were equal round reactive to light. Extraocular movements were full, visual field were full  Motor: The motor testing reveals 5 over 5 strength of all 4 extremities. Good symmetric motor tone is noted throughout.  Gait and station: Gait is normal.   DIAGNOSTIC DATA (LABS, IMAGING, TESTING) - I reviewed patient records, labs, notes, testing and imaging myself where available.  No flowsheet data found.   Lab Results  Component Value Date   WBC 8.2 11/29/2019   HGB 13.1 11/29/2019   HCT 41.9 11/29/2019   MCV 88.2 11/29/2019   PLT 249 11/29/2019      Component Value Date/Time   NA 138 11/29/2019 0455   NA 142 09/18/2019 1605   K 4.1 11/29/2019 0455   CL 107 11/29/2019 0455   CO2 24 11/29/2019 0455   GLUCOSE 86 11/29/2019 0455   BUN 8 11/29/2019 0455   BUN 7 09/18/2019 1605   CREATININE 0.80 11/29/2019 0455   CREATININE 0.84 02/07/2016 1719   CALCIUM 9.2 11/29/2019 0455   PROT 7.9 11/29/2019 0455   ALBUMIN 4.1 11/29/2019 0455   AST 21 11/29/2019 0455   ALT 23 11/29/2019 0455   ALKPHOS 55 11/29/2019 0455   BILITOT 0.8 11/29/2019 0455   GFRNONAA >60 11/29/2019 0455   GFRAA >60 11/29/2019 0455   Lab Results  Component Value Date   CHOL 169 02/07/2016   HDL 40 (L) 02/07/2016   LDLCALC 96 02/07/2016   TRIG 51 07/03/2019   CHOLHDL 4.2 02/07/2016   Lab Results  Component Value Date   HGBA1C 5.5 02/07/2016   No  results found for: VITAMINB12 Lab Results  Component Value Date   TSH 0.845 10/21/2019       ASSESSMENT AND PLAN 39 y.o. year old female  has a past medical history of Anxiety, Asthma, Chronic headaches, COVID-19, Gallstones, Gastritis, GERD (gastroesophageal reflux disease), Hyperlipemia, and Kidney stones. here with     ICD-10-CM   1. Chronic daily headache  R51.9   2. Tic disorder  F95.9   3. Anxiety  F41.9   4. Jaw pain  R68.84     Dalaina continues to have jaw pain, tension headaches, stomach aches and questionable tic (vocalization with pain). She does not have migraine symptoms at this time. ENT has referred her to dentistry for TMJ evaluation. I will start her on low dose tizanidine until she can have a formal evaluation. This may also offer relief of tension headaches. I have encouraged her to continue close follow up with GI. EGD is scheduled for next month. She will continue close follow up with PCP and psychiatry (appt this week). Healthy lifestyle habits encouraged. She will follow up with me in 6 months, sooner if needed. She verbalizes understanding and agreement with this plan.    No orders of the defined types were placed in this encounter.    Meds ordered this encounter  Medications  . tizanidine (ZANAFLEX) 2 MG capsule    Sig: Take 1 capsule (2 mg total) by mouth 3 (three) times daily as needed for muscle spasms.    Dispense:  30 capsule    Refill:  2    Order Specific Question:   Supervising Provider    Answer:   Anson Fret [id number]      I spent 15 minutes with the patient. 50% of this time was spent counseling and educating patient on plan of care and medications.    Shawnie Dapper, FNP-C 01/22/2020, 7:57 AM [facility] [address] [place], [place] [address] [phone]  Made any corrections needed, and agree with history, physical, neuro exam,assessment and plan as stated.     Naomie Dean, MD [facility]  Addendum January 29, 2020: I received an note from ENT which states that patient's severe progressive headaches based on examination and clinical history are most consistent with additional TMJ issues, TMJ was discussed with her, and she was provided with contact information for TMJ dental specialist, no findings or concerns of the otologic nature, the patient should follow-up with TMJ dental specialist.

## 2020-01-22 ENCOUNTER — Encounter: Payer: Self-pay | Admitting: Family Medicine

## 2020-01-24 ENCOUNTER — Telehealth (INDEPENDENT_AMBULATORY_CARE_PROVIDER_SITE_OTHER): Payer: BLUE CROSS/BLUE SHIELD | Admitting: Psychiatry

## 2020-01-24 ENCOUNTER — Other Ambulatory Visit: Payer: Self-pay

## 2020-01-24 ENCOUNTER — Encounter (HOSPITAL_COMMUNITY): Payer: Self-pay | Admitting: Psychiatry

## 2020-01-24 DIAGNOSIS — F064 Anxiety disorder due to known physiological condition: Secondary | ICD-10-CM | POA: Diagnosis not present

## 2020-01-24 MED ORDER — MELATONIN 10 MG PO TABS: 10.0000 mg | ORAL_TABLET | Freq: Every evening | ORAL | 2 refills | Status: DC | PRN

## 2020-01-24 MED ORDER — PAROXETINE HCL 10 MG PO TABS: ORAL_TABLET | ORAL | 0 refills | Status: DC

## 2020-01-24 NOTE — Progress Notes (Signed)
Psychiatric Initial Adult Assessment   Patient Identification: Amy Mendoza MRN:  [id number] Date of Evaluation:  01/24/2020 Referral Source: Amy Blue MD Chief Complaint:   Chief Complaint    Establish Care; Anxiety     Interview was conducted using videoconferencing application and I verified that I was speaking with the correct person using two identifiers. I discussed the limitations of evaluation and management by telemedicine and  the availability of in person appointments. Patient expressed understanding and agreed to proceed. Patient location - home; physician - home office.  Visit Diagnosis:    ICD-10-CM   1. 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 In December 2020 she was hospitalized with COVID infection for a week. Within a week of discharge (in early January) she started to experience a variety of somatic symptoms: twitching, grunting, headaches, chest pain. She went to ED several times wher she ws told that these are likely sx of panic type anxiety. Her upper body/arm twitching seems to be triggered by heightened anxiety. It comes in waves and makes her very uncomfortable in social situations which she now actively avoids. She would also fear to go to bed worrying that "something may happen" while she is asleep. She has been seen by neurologist, had brain MRI done which was normal. She has been prescribed hydroxyzine 25 mg prn which she finds helpful. She takes melatonin 5-10 mg OTC which helps with sleep. She was tried on sertraline but reports that her anxiety spiked and she felt "wired" on it - it is listed as allergy which is not correct as these are not unusual adverse effects associated with this mediation. Of note is that there is no personal or family hx of psychiatric problems including anxiety and no hx of tick disorder in family. She has no hx of alcohol or  drug abuse.  Medical hx and labwork reviewed.  Associated Signs/Symptoms: Depression Symptoms:  insomnia, anxiety, panic attacks, (Hypo) Manic Symptoms:  None Anxiety Symptoms:  Excessive Worry, Panic Symptoms, Psychotic Symptoms:  None PTSD Symptoms: Negative  Past Psychiatric History: None.  Previous Psychotropic Medications: Yes   Substance Abuse History in the last 12 months:  No.  Consequences of Substance Abuse: NA  Past Medical History:  Past Medical History:  Diagnosis Date  . Anxiety   . Asthma   . Chronic headaches   . COVID-19   . Gallstones   . Gastritis   . GERD (gastroesophageal reflux disease)   . Hyperlipemia   . Kidney stones     Past Surgical History:  Procedure Laterality Date  . CHOLECYSTECTOMY  feb 2006  . KNEE SURGERY Left     Family Psychiatric History: Negative.  Family History:  Family History  Problem Relation Age of Onset  . Diabetes Father   . Heart failure Father   . Heart attack Father   . Prostate cancer Maternal Grandfather   . Pancreatic cancer Maternal Grandfather   . Breast cancer Maternal Aunt   . Ovarian cancer Mother   . Diabetes Maternal Grandmother   . Kidney Stones Maternal Grandmother   . Heart attack Paternal Grandfather   . Heart attack Paternal Aunt   . Diabetes Maternal Uncle   . Asthma Son   . Colon cancer Neg Hx   . Tics Neg Hx   . Migraines Neg Hx     Social History:  Social History   Socioeconomic History  . Marital status: Single    Spouse name: Not on file  . Number of children: 1  . Years of education: Not on file  . Highest education level: Associate degree: occupational, Scientist, product/process developmenttechnical, or vocational program  Occupational History  . Occupation: LPN  Tobacco Use  . Smoking status: Never Smoker  . Smokeless tobacco: Never Used  Vaping Use  . Vaping Use: Former  Substance and Sexual Activity  . Alcohol use: Yes    Comment: occasionally-social  . Drug use: Not Currently    Types:  Marijuana    Comment: tried marijuana once in an edible but it made her freak out.   Marland Kitchen. Sexual activity: Yes    Birth control/protection: I.U.D.  Other Topics Concern  . 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   Right handed   Caffeine: none    Social Determinants of Health   Financial Resource Strain:   . Difficulty of Paying Living Expenses:   Food Insecurity:   . Worried About Programme researcher, broadcasting/film/videounning Out of Food in the Last Year:   . Baristaan Out of Food in the Last Year:   Transportation Needs:   . Freight forwarderLack of Transportation (Medical):   Marland Kitchen. Lack of Transportation (Non-Medical):   Physical Activity:   . Days of Exercise per Week:   . Minutes of Exercise per Session:   Stress:   . Feeling of Stress :   Social Connections:   . Frequency of Communication with Friends and Family:   . Frequency of Social Gatherings with Friends and Family:   . Attends Religious Services:   . Active Member of Clubs or Organizations:   . Attends BankerClub or Organization Meetings:   Marland Kitchen. Marital Status:      Allergies:   Allergies  Allergen Reactions  . Zoloft [Sertraline]     Pt states it made her crazy/worsened anxiety.     Metabolic Disorder Labs: Lab Results  Component Value Date   HGBA1C 5.5 02/07/2016   MPG 111 02/07/2016   No results found for: PROLACTIN Lab Results  Component Value Date   CHOL 169 02/07/2016   TRIG 51 07/03/2019   HDL 40 (L) 02/07/2016   CHOLHDL 4.2 02/07/2016   VLDL 33 (H) 02/07/2016   LDLCALC 96 02/07/2016   Lab Results  Component Value Date   TSH 0.845 10/21/2019    Therapeutic Level Labs: No results found for: LITHIUM No results found for: CBMZ No results found for: VALPROATE  Current Medications: Current Outpatient Medications  Medication Sig Dispense Refill  . dicyclomine (BENTYL) 10 MG capsule Take 1 capsule (10 mg total) by mouth in the morning, at noon, in the evening, and at bedtime. 120 capsule 0   . fluticasone (FLONASE) 50 MCG/ACT nasal spray Place 2 sprays into both nostrils daily. 9.9 mL 2  . Melatonin 10 MG TABS Take 10 mg by mouth at bedtime as needed (sleep). 30 tablet 2  . omeprazole (PRILOSEC) 40 MG capsule Take 1 capsule (40 mg total) by mouth in the morning and at bedtime. 60 capsule 2  . ondansetron (ZOFRAN) 4 MG tablet Take 1 tablet (4 mg total) by mouth daily as needed for nausea or vomiting. 30 tablet 1  . PARoxetine (PAXIL) 10 MG tablet Take 1 tablet (10 mg total) by mouth daily for 14 days, THEN 2 tablets (20 mg total) daily. 74 tablet 0  .  tizanidine (ZANAFLEX) 2 MG capsule Take 1 capsule (2 mg total) by mouth 3 (three) times daily as needed for muscle spasms. 30 capsule 2   No current facility-administered medications for this visit.    Psychiatric Specialty Exam: Review of Systems  Neurological: Positive for numbness.  Psychiatric/Behavioral: The patient is nervous/anxious.   All other systems reviewed and are negative.   There were no vitals taken for this visit.There is no height or weight on file to calculate BMI.  General Appearance: Casual and Fairly Groomed  Eye Contact:  Good  Speech:  Clear and Coherent and Normal Rate  Volume:  Normal  Mood:  Anxious  Affect:  Congruent  Thought Process:  Goal Directed  Orientation:  Full (Time, Place, and Person)  Thought Content:  Rumination  Suicidal Thoughts:  No  Homicidal Thoughts:  No  Memory:  Immediate;   Good Recent;   Good Remote;   Good  Judgement:  Good  Insight:  Fair  Psychomotor Activity:  Normal  Concentration:  Concentration: Good  Recall:  Good  Fund of Knowledge:Good  Language: Good  Akathisia:  Negative  Handed:  Right  AIMS (if indicated):  not done  Assets:  Communication Skills Desire for Improvement Financial Resources/Insurance Housing Social Support Talents/Skills  ADL's:  Intact  Cognition: WNL  Sleep:  Fair   Screenings: GAD-7     Office Visit from 11/13/2019 in [facility] Office Visit from 10/20/2019 in [facility] Office Visit from 09/18/2019 in [facility]  Total GAD-7 Score 4 10 17     PHQ2-9     Office Visit from 11/13/2019 in [facility] Office Visit from 10/20/2019 in [facility] Office Visit from 09/18/2019 in [facility] Office Visit from 01/18/2017 in Primary Care at [facility] Visit from 02/07/2016 in Primary Care at [facility] Total Score 0 2 0 0 0  PHQ-9 Total Score 2 4 -- -- --      Assessment and Plan: 39 yo SAAF with no prior psychiatric history. In December 2020 she was hospitalized with COVID infection for a week. Within a week of discharge (in early January) she started to experience a variety of somatic symptoms: twitching, grunting, headaches, chest pain. She went to ED several times wher she ws told that these are likely sx of panic type anxiety. Her upper body/arm twitching seems to be triggered by heightened anxiety. It comes in waves and makes her very uncomfortable in social situations which she now actively avoids. She would also fear to go to bed worrying that "something may happen" while she is asleep. She has been seen by neurologist, had brain MRI done which was normal. She has been prescribed hydroxyzine 25 mg prn which she finds helpful. She takes melatonin 5-10 mg OTC which helps with sleep. She was tried on sertraline but reports that her anxiety spiked and she felt "wired" on it - it is listed as allergy which is not correct as these are not unusual adverse effects associated with this mediation. Of note is that there is no personal or family hx of psychiatric problems including anxiety and no hx of tick disorder in family. She has no hx of alcohol or drug abuse.  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 She is of impression that some symptoms subsided after she received vaccine later on. I suspect that her symptoms are a post-viral syndrome which we may see more often and are still learning about.   Plan: I encouraged her to continue taking hydroxyzine as she finds ut helpful for anxiety as well as melatonin for sleep. We decided to try a different SSRI - paroxetine - at allow dose as her adverse reaction to sertraline does not necessarily mean she will not be able to tolerate another medication from this class if  cautiously introduced. We will start at 10 mg daily. I will make a referral for CBT as well. The plan was discussed with patient who had an opportunity to ask questions and these were all answered. I spend 60 minutes in videoconferencing with the patient and devoted approximately 50% of this time to explanation of diagnosis, discussion of treatment options and med education.  Magdalene Patricia, MD 7/10/202110:40 AM

## 2020-02-02 ENCOUNTER — Telehealth: Payer: Self-pay | Admitting: Family Medicine

## 2020-02-02 NOTE — Telephone Encounter (Signed)
ENT notes from Dr. Annalee Genta reviewed.  ENT examination does reveal bilateral TMJ tenderness, greater on the left with mandibular excursion and mild crepitance.  Patient was diagnosed with TMJ.  TMJ precautions recommended and patient was provided contact information for TMJ dental specialist.  No other ENT findings or concerns.

## 2020-02-03 ENCOUNTER — Emergency Department
Admission: EM | Admit: 2020-02-03 | Discharge: 2020-02-03 | Disposition: A | Discharge status: Home / Self Care | Payer: BLUE CROSS/BLUE SHIELD | Attending: Emergency Medicine | Admitting: Emergency Medicine

## 2020-02-03 ENCOUNTER — Encounter: Payer: Self-pay | Admitting: Internal Medicine

## 2020-02-03 ENCOUNTER — Other Ambulatory Visit: Payer: Self-pay

## 2020-02-03 DIAGNOSIS — J45909 Unspecified asthma, uncomplicated: Secondary | ICD-10-CM | POA: Diagnosis not present

## 2020-02-03 DIAGNOSIS — R531 Weakness: Secondary | ICD-10-CM | POA: Insufficient documentation

## 2020-02-03 DIAGNOSIS — R29898 Other symptoms and signs involving the musculoskeletal system: Secondary | ICD-10-CM

## 2020-02-03 LAB — COMPREHENSIVE METABOLIC PANEL
ALT: 29 U/L (ref 0–44)
AST: 21 U/L (ref 15–41)
Albumin: 4.1 g/dL (ref 3.5–5.0)
Alkaline Phosphatase: 66 U/L (ref 38–126)
Anion gap: 7 (ref 5–15)
BUN: 9 mg/dL (ref 6–20)
CO2: 24 mmol/L (ref 22–32)
Calcium: 9.1 mg/dL (ref 8.9–10.3)
Chloride: 103 mmol/L (ref 98–111)
Creatinine, Ser: 1.08 mg/dL — ABNORMAL HIGH (ref 0.44–1.00)
GFR calc Af Amer: >60 mL/min (ref >60)
GFR calc non Af Amer: >60 mL/min (ref >60)
Glucose, Bld: 106 mg/dL — ABNORMAL HIGH (ref 70–99)
Potassium: 3.7 mmol/L (ref 3.5–5.1)
Sodium: 134 mmol/L — ABNORMAL LOW (ref 135–145)
Total Bilirubin: 0.7 mg/dL (ref 0.3–1.2)
Total Protein: 8.3 g/dL — ABNORMAL HIGH (ref 6.5–8.1)

## 2020-02-03 LAB — CBC
HCT: 40 % (ref 36.0–46.0)
Hemoglobin: 13 g/dL (ref 12.0–15.0)
MCH: 27.3 pg (ref 26.0–34.0)
MCHC: 32.5 g/dL (ref 30.0–36.0)
MCV: 83.9 fL (ref 80.0–100.0)
Platelets: 299 10*3/uL (ref 150–400)
RBC: 4.77 MIL/uL (ref 3.87–5.11)
RDW: 12.7 % (ref 11.5–15.5)
WBC: 5.7 10*3/uL (ref 4.0–10.5)
nRBC: 0 % (ref 0.0–0.2)

## 2020-02-03 LAB — URINALYSIS, COMPLETE (UACMP) WITH MICROSCOPIC
Bilirubin Urine: NEGATIVE
Glucose, UA: NEGATIVE mg/dL
Ketones, ur: NEGATIVE mg/dL
Leukocytes,Ua: NEGATIVE
Nitrite: NEGATIVE
Protein, ur: NEGATIVE mg/dL
Specific Gravity, Urine: 1.015 (ref 1.005–1.030)
pH: 5 (ref 5.0–8.0)

## 2020-02-03 LAB — POC URINE PREG, ED: Preg Test, Ur: NEGATIVE

## 2020-02-03 LAB — LIPASE, BLOOD: Lipase: 32 U/L (ref 11–51)

## 2020-02-03 MED ORDER — LORAZEPAM 1 MG PO TABS
1.0000 mg | ORAL_TABLET | Freq: Once | ORAL | Status: AC
  Administered 2020-02-03: 1 mg via ORAL
  Filled 2020-02-03: qty 1

## 2020-02-03 MED ORDER — SODIUM CHLORIDE 0.9% FLUSH: 3.0000 mL | Freq: Once | INTRAVENOUS | Status: DC

## 2020-02-03 NOTE — ED Triage Notes (Addendum)
Pt comes via EMS with c/o weakness to legs. Pt comes from work and states this started yesterday. Pt states she got bite by a bug early in July and had it lancet.   VSS per EMS  Pt states pm Saturday she was walking in the mall and experienced the weakness in her legs.  Pt denies any CP or SOB.  Pt grunts out and when asked what that was pt states it just happens and she can't control it.

## 2020-02-03 NOTE — Discharge Instructions (Addendum)
Please seek medical attention for any high fevers, chest pain, shortness of breath, change in behavior, persistent vomiting, bloody stool or any other new or concerning symptoms.  

## 2020-02-03 NOTE — ED Provider Notes (Signed)
[facility] Emergency Department Provider Note   ____________________________________________   I have reviewed the triage vital signs and the nursing notes.   HISTORY  Chief Complaint Weakness   History limited by: Not Limited   HPI Amy Mendoza is a 39 y.o. female who presents to the emergency department today because of concern for weakness in her legs. The patient states she first had an episode of weakness two days ago. It is both legs that feel weak. She states that today they were shaking and became so weak that she felt she might pass out. She felt like her heart was racing. She says she has been having lots of issues since contracting COVID a number of months ago. Has seen multiple doctors over the next months for numerous complaints. Patient expresses lots of frustration about not having any solid diagnosis as to what is causing her illnesses.   Records reviewed. Per medical record review patient has a history of COVID 19. History of migraines.   Past Medical History:  Diagnosis Date  . Anxiety   . Asthma   . Chronic headaches   . COVID-19   . Gallstones   . Gastritis   . GERD (gastroesophageal reflux disease)   . Hyperlipemia   . Kidney stones     Patient Active Problem List   Diagnosis Date Noted  . Anxiety disorder due to medical condition 01/24/2020  . Chronic migraine without aura with status migrainosus, not intractable 10/21/2019  . Generalized anxiety disorder 09/18/2019  . Tic disorder 09/18/2019  . Dry skin dermatitis 09/18/2019  . Dry heaves 09/18/2019  . Personal history of covid-19 09/18/2019  . Obesity, Class III, BMI 40-49.9 (morbid obesity) ([place]) 07/07/2019  . Pneumonia due to COVID-19 virus 07/03/2019  . Acute respiratory failure with hypoxia ([place]) 07/03/2019  . Mild intermittent asthma 07/03/2019  . Routine general medical examination at a health care facility 01/18/2017    Past Surgical History:  Procedure  Laterality Date  . CHOLECYSTECTOMY  feb 2006  . KNEE SURGERY Left     Prior to Admission medications   Medication Sig Start Date End Date Taking? Authorizing Provider  dicyclomine (BENTYL) 10 MG capsule Take 1 capsule (10 mg total) by mouth in the morning, at noon, in the evening, and at bedtime. 01/12/20 02/11/20  Levin Erp, PA  fluticasone (FLONASE) 50 MCG/ACT nasal spray Place 2 sprays into both nostrils daily. 10/19/19   Corena Herter, PA-C  Melatonin 10 MG TABS Take 10 mg by mouth at bedtime as needed (sleep). 01/24/20 04/23/20  Pucilowski, Marchia Bond, MD  omeprazole (PRILOSEC) 40 MG capsule Take 1 capsule (40 mg total) by mouth in the morning and at bedtime. 12/25/19   Levin Erp, PA  ondansetron (ZOFRAN) 4 MG tablet Take 1 tablet (4 mg total) by mouth daily as needed for nausea or vomiting. 09/18/19   Ladell Pier, MD  PARoxetine (PAXIL) 10 MG tablet Take 1 tablet (10 mg total) by mouth daily for 14 days, THEN 2 tablets (20 mg total) daily. 01/24/20 03/08/20  Pucilowski, Marchia Bond, MD  tizanidine (ZANAFLEX) 2 MG capsule Take 1 capsule (2 mg total) by mouth 3 (three) times daily as needed for muscle spasms. 01/21/20   Debbora Presto, NP    Allergies Zoloft [sertraline]  Family History  Problem Relation Age of Onset  . Diabetes Father   . Heart failure Father   . Heart attack Father   . Prostate cancer Maternal Grandfather   .  Pancreatic cancer Maternal Grandfather   . Breast cancer Maternal Aunt   . Ovarian cancer Mother   . Diabetes Maternal Grandmother   . Kidney Stones Maternal Grandmother   . Heart attack Paternal Grandfather   . Heart attack Paternal Aunt   . Diabetes Maternal Uncle   . Asthma Son   . Colon cancer Neg Hx   . Tics Neg Hx   . Migraines Neg Hx     Social History Social History   Tobacco Use  . Smoking status: Never Smoker  . Smokeless tobacco: Never Used  Vaping Use  . Vaping Use: Former  Substance Use Topics  . Alcohol use: Yes     Comment: occasionally-social  . Drug use: Not Currently    Types: Marijuana    Comment: tried marijuana once in an edible but it made her freak out.     Review of Systems Constitutional: No fever/chills Eyes: No visual changes. ENT: No sore throat. Cardiovascular: Denies chest pain. Respiratory: Denies shortness of breath. Gastrointestinal: No abdominal pain.  No nausea, no vomiting.  No diarrhea.   Genitourinary: Negative for dysuria. Musculoskeletal: Negative for back pain. Skin: Positive for abscess to left forearm that was recently drained.  Neurological: Positive for headache. Positive for bilateral leg weakness.  ____________________________________________   PHYSICAL EXAM:  VITAL SIGNS: ED Triage Vitals  Enc Vitals Group     BP 02/03/20 1607 134/87     Pulse Rate 02/03/20 1607 (!) 102     Resp 02/03/20 1607 17     Temp 02/03/20 1607 99.3 F (37.4 C)     Temp src --      SpO2 02/03/20 1607 100 %     Weight 02/03/20 1605 268 lb (121.6 kg)     Height 02/03/20 1605 5' 9.5" (1.765 m)     Head Circumference --      Peak Flow --      Pain Score 02/03/20 1605 0   Constitutional: Alert and oriented.  Eyes: Conjunctivae are normal.  ENT      Head: Normocephalic and atraumatic.      Nose: No congestion/rhinnorhea.      Mouth/Throat: Mucous membranes are moist.      Neck: No stridor. Hematological/Lymphatic/Immunilogical: No cervical lymphadenopathy. Cardiovascular: Normal rate, regular rhythm.  No murmurs, rubs, or gallops.  Respiratory: Normal respiratory effort without tachypnea nor retractions. Breath sounds are clear and equal bilaterally. No wheezes/rales/rhonchi. Gastrointestinal: Soft and non tender. No rebound. No guarding.  Genitourinary: Deferred Musculoskeletal: Normal range of motion in all extremities. No lower extremity edema. Neurologic:  Normal speech and language. EOMI. PERRL. Tongue midline. Symmetric palate elevation. Face symmetric. No pronator  drift. No lower extremity drift. Sensation intact. Skin:  Skin is warm, dry and intact. No rash noted. Psychiatric: Tearful. Frustrated.   ____________________________________________    [name] (pertinent positives/negatives)  Upreg negative UA large hgb dipstick, 6-10 wbc, rare bacteria, 11-20 squamous epi CMP na 134, k 3.7, glu 106, cr 1.08 Lipase 32 CBC wbc 5.7, hgb 13.0, plt 299  ____________________________________________   EKG  None  ____________________________________________    RADIOLOGY  None  ____________________________________________   PROCEDURES  Procedures  ____________________________________________   INITIAL IMPRESSION / ASSESSMENT AND PLAN / ED COURSE  Pertinent labs & imaging results that were available during my care of the patient were reviewed by me and considered in my medical decision making (see chart for details).   Patient presented to the emergency department today because of concerns for lower  extremity weakness.  Patient states that she has been having myriad issues since coming down with Covid.  States his leg weakness started a couple of days ago.  On exam patient is able to raise both legs off the bed.  She has full sensation.  During my exam patient was quite tearful.  Do wonder if she might of had a panic attack especially since she stated her heart rate went so high.  Patient was given Ativan.  While this did seem to help calm her she still complained of some weakness.  On attempted ambulation however she was able to ambulate on it he did.  I do think patient would benefit from close neurology follow-up.  Blood work without concerning findings. At this time would think something like guillain barre/tick paralysis less likely. She states she already sees a neurologist who encouraged her to follow-up with them.  ____________________________________________   FINAL CLINICAL IMPRESSION(S) / ED DIAGNOSES  Final diagnoses:  Weakness of  both lower extremities     Note: This dictation was prepared with Dragon dictation. Any transcriptional errors that result from this process are unintentional     Nance Pear, MD 02/03/20 2249

## 2020-02-04 ENCOUNTER — Other Ambulatory Visit: Payer: Self-pay | Admitting: Physician Assistant

## 2020-02-04 LAB — URINE CULTURE

## 2020-02-05 ENCOUNTER — Telehealth: Payer: Self-pay | Admitting: Internal Medicine

## 2020-02-05 ENCOUNTER — Ambulatory Visit (HOSPITAL_BASED_OUTPATIENT_CLINIC_OR_DEPARTMENT_OTHER): Payer: BLUE CROSS/BLUE SHIELD

## 2020-02-05 ENCOUNTER — Other Ambulatory Visit: Payer: Self-pay

## 2020-02-05 NOTE — Telephone Encounter (Signed)
Copied from CRM [id number]. Topic: Appointment Scheduling - Scheduling Inquiry for Clinic >> Feb 05, 2020  1:11 PM Amy Mendoza, [place] wrote: Reason for CRM: Pt stated she can barely walk due to her legs being weak. Attempted to schedule appt but pt requested to be sooner than the available appts. Pt requests call back

## 2020-02-11 ENCOUNTER — Encounter (HOSPITAL_COMMUNITY): Payer: Self-pay

## 2020-02-11 ENCOUNTER — Emergency Department (HOSPITAL_COMMUNITY)
Admission: EM | Admit: 2020-02-11 | Discharge: 2020-02-11 | Disposition: A | Discharge status: Home / Self Care | Payer: BLUE CROSS/BLUE SHIELD | Attending: Emergency Medicine | Admitting: Emergency Medicine

## 2020-02-11 ENCOUNTER — Other Ambulatory Visit: Payer: Self-pay

## 2020-02-11 DIAGNOSIS — R29898 Other symptoms and signs involving the musculoskeletal system: Secondary | ICD-10-CM

## 2020-02-11 DIAGNOSIS — Z79899 Other long term (current) drug therapy: Secondary | ICD-10-CM | POA: Insufficient documentation

## 2020-02-11 DIAGNOSIS — J45909 Unspecified asthma, uncomplicated: Secondary | ICD-10-CM | POA: Insufficient documentation

## 2020-02-11 DIAGNOSIS — R531 Weakness: Secondary | ICD-10-CM | POA: Insufficient documentation

## 2020-02-11 NOTE — Discharge Instructions (Signed)
Please make sure to follow-up with your primary care doctor.

## 2020-02-11 NOTE — ED Provider Notes (Addendum)
[facility]-EMERGENCY DEPT Provider Note   CSN: [id number] Arrival date & time: 02/11/20  1030     History Chief Complaint  Patient presents with  . Weakness    Amy Mendoza is a 39 y.o. female.  HPI 39 year old female with history of asthma, anxiety, chronic headaches, COVID-19 in 2020, GERD, hyperlipidemia, kidney stones presents to the ER with complaints of bilateral leg weakness for the last week.  She was seen at [facility] on 02/03/2020, basic blood work done which was unremarkable (reviewed by myself).  She presents today with no change in her symptoms.  She states that on several occasions, her legs have given out on her.  She reports some burning in her lower extremities and pain behind her calf.  She denies any significant swelling, warmth, erythema.  No recent travel or surgeries.  She is on the Depo shot.  She denies any shortness of breath.  She states that she will sometimes get chest pain periodically, however she thinks that this is secondary to her GERD and possibly panic attacks.  She has no chest pain at this time.  She was told in [facility] to follow up with her neurologist, she is followed by [facility].  Patient reports that they do not have any openings things until October.  She was placed on the early cancellation list.  She also states that she has a primary care follow-up tomorrow with her PCP.  Patient is tearful and frustrated because she does not know what is causing her symptoms.  She also states that she was bitten by a mosquito several weeks ago, and developed an abscess and was put on Bactrim and had the abscess drained.  She is wondering if her symptoms are due to the Bactrim.  When asked what brought the patient to the ER and what we can do for her today, patient replies with "I do not know, I just want some answers".  She denies any bowel bladder incontinence, no numbness in her  extremities.  No recent IVDU, no night sweats or weight loss.    Past Medical History:  Diagnosis Date  . Anxiety   . Asthma   . Chronic headaches   . COVID-19   . Gallstones   . Gastritis   . GERD (gastroesophageal reflux disease)   . Hyperlipemia   . Kidney stones     Patient Active Problem List   Diagnosis Date Noted  . Anxiety disorder due to medical condition 01/24/2020  . Chronic migraine without aura with status migrainosus, not intractable 10/21/2019  . Generalized anxiety disorder 09/18/2019  . Tic disorder 09/18/2019  . Dry skin dermatitis 09/18/2019  . Dry heaves 09/18/2019  . Personal history of covid-19 09/18/2019  . Obesity, Class III, BMI 40-49.9 (morbid obesity) (HCC) 07/07/2019  . Pneumonia due to COVID-19 virus 07/03/2019  . Acute respiratory failure with hypoxia (HCC) 07/03/2019  . Mild intermittent asthma 07/03/2019  . Routine general medical examination at a health care facility 01/18/2017    Past Surgical History:  Procedure Laterality Date  . CHOLECYSTECTOMY  feb 2006  . KNEE SURGERY Left      OB History   No obstetric history on file.     Family History  Problem Relation Age of Onset  . Diabetes Father   . Heart failure Father   . Heart attack Father   . Prostate cancer Maternal Grandfather   . Pancreatic cancer Maternal Grandfather   .  Breast cancer Maternal Aunt   . Ovarian cancer Mother   . Diabetes Maternal Grandmother   . Kidney Stones Maternal Grandmother   . Heart attack Paternal Grandfather   . Heart attack Paternal Aunt   . Diabetes Maternal Uncle   . Asthma Son   . Colon cancer Neg Hx   . Tics Neg Hx   . Migraines Neg Hx     Social History   Tobacco Use  . Smoking status: Never Smoker  . Smokeless tobacco: Never Used  Vaping Use  . Vaping Use: Former  Substance Use Topics  . Alcohol use: Yes    Comment: occasionally-social  . Drug use: Not Currently    Types: Marijuana    Comment: tried marijuana once in an  edible but it made her freak out.     Home Medications Prior to Admission medications   Medication Sig Start Date End Date Taking? Authorizing Provider  dicyclomine (BENTYL) 10 MG capsule TAKE 1 CAPSULE (10 MG TOTAL) BY MOUTH IN THE MORNING, AT NOON, IN THE EVENING, AND AT BEDTIME. 02/04/20 03/05/20  Unk Lightning, PA  fluticasone (FLONASE) 50 MCG/ACT nasal spray Place 2 sprays into both nostrils daily. 10/19/19   Lorelee New, PA-C  Melatonin 10 MG TABS Take 10 mg by mouth at bedtime as needed (sleep). 01/24/20 04/23/20  Pucilowski, Roosvelt Maser, MD  omeprazole (PRILOSEC) 40 MG capsule Take 1 capsule (40 mg total) by mouth in the morning and at bedtime. 12/25/19   Unk Lightning, PA  ondansetron (ZOFRAN) 4 MG tablet Take 1 tablet (4 mg total) by mouth daily as needed for nausea or vomiting. 09/18/19   Marcine Matar, MD  PARoxetine (PAXIL) 10 MG tablet Take 1 tablet (10 mg total) by mouth daily for 14 days, THEN 2 tablets (20 mg total) daily. 01/24/20 03/08/20  Pucilowski, Roosvelt Maser, MD  tizanidine (ZANAFLEX) 2 MG capsule Take 1 capsule (2 mg total) by mouth 3 (three) times daily as needed for muscle spasms. 01/21/20   Lomax, Amy, NP    Allergies    Zoloft [sertraline]  Review of Systems   Review of Systems  Constitutional: Negative for chills and fever.  HENT: Negative for ear pain and sore throat.   Eyes: Negative for pain and visual disturbance.  Respiratory: Negative for cough and shortness of breath.   Cardiovascular: Negative for chest pain and palpitations.  Gastrointestinal: Negative for abdominal pain and vomiting.  Genitourinary: Negative for dysuria and hematuria.  Musculoskeletal: Negative for arthralgias, back pain, joint swelling, myalgias and neck stiffness.  Skin: Negative for color change and rash.  Neurological: Positive for weakness (Lower extremity bilaterally). Negative for seizures, syncope and numbness.  All other systems reviewed and are  negative.   Physical Exam Updated Vital Signs BP (!) 125/86 (BP Location: Right Arm)   Pulse 84   Temp 98.4 F (36.9 C) (Oral)   Resp 16   Ht 5\' 9"  (1.753 m)   Wt (!) 121.6 kg   LMP 02/03/2020   SpO2 99%   BMI 39.58 kg/m   Physical Exam Vitals and nursing note reviewed.  Constitutional:      General: She is not in acute distress.    Appearance: She is well-developed. She is obese. She is not ill-appearing, toxic-appearing or diaphoretic.     Comments: Patient tearful  HENT:     Head: Normocephalic and atraumatic.     Nose: Nose normal.     Mouth/Throat:  Mouth: Mucous membranes are moist.  Eyes:     Conjunctiva/sclera: Conjunctivae normal.  Cardiovascular:     Rate and Rhythm: Normal rate and regular rhythm.     Heart sounds: No murmur heard.   Pulmonary:     Effort: Pulmonary effort is normal. No respiratory distress.     Breath sounds: Normal breath sounds.  Abdominal:     Palpations: Abdomen is soft.     Tenderness: There is no abdominal tenderness.  Musculoskeletal:        General: Tenderness present. No swelling, deformity or signs of injury.     Cervical back: Neck supple.     Right lower leg: No edema.     Left lower leg: No edema.     Comments: 5/5 strength in upper and lower extremities.  Patient able to ambulate in the ER, though gingerly.  There is no swelling, erythema to her posterior calf.  Mild tenderness to palpation to the calves bilaterally.  Gross sensations intact.  5/5 strength with dorsi flexion and plantar flexion.  2+ DP pulses.  No midline tenderness to the L-spine, no step-offs or crepitus.     Skin:    General: Skin is warm and dry.     Capillary Refill: Capillary refill takes less than 2 seconds.     Findings: No erythema or rash.  Neurological:     General: No focal deficit present.     Mental Status: She is alert and oriented to person, place, and time.     Sensory: No sensory deficit.     Motor: No weakness.     Deep Tendon  Reflexes:     Reflex Scores:      Patellar reflexes are 2+ on the right side and 2+ on the left side.      Achilles reflexes are 2+ on the right side and 2+ on the left side. Psychiatric:        Mood and Affect: Mood normal.        Behavior: Behavior normal.     ED Results / Procedures / Treatments   Labs (all labs ordered are listed, but only abnormal results are displayed) Labs Reviewed - No data to display  EKG EKG Interpretation  Date/Time:  Wednesday February 11 2020 10:58:04 EDT Ventricular Rate:  86 PR Interval:  148 QRS Duration: 70 QT Interval:  362 QTC Calculation: 433 R Axis:   41 Text Interpretation: Normal sinus rhythm with sinus arrhythmia Normal ECG No significant change since last tracing Confirmed by Gwyneth Sprout ([id number]) on 02/11/2020 3:13:26 PM   Radiology No results found.  Procedures Procedures (including critical care time)  Medications Ordered in ED Medications - No data to display  ED Course  I have reviewed the triage vital signs and the nursing notes.  Pertinent labs & imaging results that were available during my care of the patient were reviewed by me and considered in my medical decision making (see chart for details).    MDM Rules/Calculators/A&P                         39 year old female with bilateral lower extremity weakness and burning x1 week on presentation, the patient is slightly anxious, tearful, but is in no acute distress, no gross neurological deficits.  Vitals overall reassuring.  Physical exam with intact strength and sensations through his lower extremities.  She was able to ambulate in front of me in the ED, though seems a bit  guarded.  She does not endorse any changes to her symptoms since she was last seen in the ED at [facility] a week ago.  Patient states that she was diagnosed with Covid in 2020 and has not been right since.  When discussing goals of care, the patient states that she just wants answers.  DDx includes Covid  neuropathy,  Margarita Mail, MS, tickborne neuropathy, peripheral neuropathy, lumbar radiculopathy, DVT.  Patient states that she had an MRI done in April of this year ordered by her neurologist, which I personally reviewed and showed no abnormalities.  She would benefit from further neurology work-up, possible spine MRI.  However she has no acute signs of cord compression, no evidence of an MS flare.  Patient stated that her family doctor had suggested that her symptoms are due to anxiety/psychogenic and had recommended that she see a psychiatrist.  Patient states that she has been following with a psychiatrist who does not think that her symptoms are psychogenic.  Concern for DVT low, she has no swelling, erythema, very mild palpation to the calf and she has no risk factors.  Given the patient had just had full set of lab work and UA done a week ago at [employer] [facility] which was all normal, I do not think she needs any additional lab work at this time given she has had no change in her symptoms.  I discussed case with Dr. Anitra Lauth who is agreeable to have the patient follow-up with her PCP, who could potentially order her an MRI to expedite the neurology work-up.  When I returned to the room to further discuss the plan of care with the patient, she had left.  However I was able to discuss with her originally on my first visit follow-up with her PCP and she was agreeable with this.  Final Clinical Impression(s) / ED Diagnoses Final diagnoses:  Weakness of both lower extremities    Rx / DC Orders ED Discharge Orders    None          Leone Brand 02/11/20 1536    Gwyneth Sprout, MD 02/13/20 2104

## 2020-02-11 NOTE — ED Notes (Signed)
Pt left with out vital signs

## 2020-02-11 NOTE — ED Triage Notes (Signed)
Pt c/o bilateral leg weakness X1 week. Patient seen at [facility] ED for same 02/03/20 and given anxiety medication.   Patient reports near syncope episode last week at work.   Patient told to follow up with neurologist.   8/10 pain    No known allergies  Patient recently finished bactrim for bug bite.

## 2020-02-12 ENCOUNTER — Ambulatory Visit: Payer: BLUE CROSS/BLUE SHIELD | Attending: Internal Medicine | Admitting: Internal Medicine

## 2020-02-12 ENCOUNTER — Other Ambulatory Visit: Payer: Self-pay

## 2020-02-12 ENCOUNTER — Encounter: Payer: Self-pay | Admitting: Internal Medicine

## 2020-02-12 ENCOUNTER — Other Ambulatory Visit: Payer: Self-pay | Admitting: Neurology

## 2020-02-12 VITALS — BP 115/77 | HR 83 | Temp 98.6°F | Resp 18 | Ht 69.0 in | Wt 262.0 lb

## 2020-02-12 DIAGNOSIS — G6289 Other specified polyneuropathies: Secondary | ICD-10-CM

## 2020-02-12 DIAGNOSIS — Z3042 Encounter for surveillance of injectable contraceptive: Secondary | ICD-10-CM | POA: Diagnosis not present

## 2020-02-12 DIAGNOSIS — R29898 Other symptoms and signs involving the musculoskeletal system: Secondary | ICD-10-CM
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
## 2020-02-12 NOTE — Progress Notes (Signed)
Pt c/o weakness on her legs X 1 wk . Seen in the Ed and referred to Neurology

## 2020-02-14 ENCOUNTER — Encounter: Payer: Self-pay | Admitting: Internal Medicine

## 2020-02-14 LAB — LYME, IGM, EARLY TEST/REFLEX: LYME DISEASE AB, QUANT, IGM: <0.8 index (ref 0.00–0.79)

## 2020-02-14 LAB — HEMOGLOBIN A1C
Est. average glucose Bld gHb Est-mCnc: 108 mg/dL
Hgb A1c MFr Bld: 5.4 % (ref 4.8–5.6)

## 2020-02-14 LAB — TSH: TSH: 0.912 u[IU]/mL (ref 0.450–4.500)

## 2020-02-14 LAB — VITAMIN B12: Vitamin B-12: 339 pg/mL (ref 232–1245)

## 2020-02-17 ENCOUNTER — Other Ambulatory Visit (HOSPITAL_COMMUNITY): Payer: Self-pay | Admitting: Psychiatry

## 2020-02-18 ENCOUNTER — Other Ambulatory Visit: Payer: Self-pay | Admitting: Physician Assistant

## 2020-02-24 ENCOUNTER — Other Ambulatory Visit: Payer: Self-pay

## 2020-02-24 ENCOUNTER — Telehealth (INDEPENDENT_AMBULATORY_CARE_PROVIDER_SITE_OTHER): Payer: BLUE CROSS/BLUE SHIELD | Admitting: Psychiatry

## 2020-02-24 DIAGNOSIS — F064 Anxiety disorder due to known physiological condition: Secondary | ICD-10-CM | POA: Diagnosis not present

## 2020-02-24 NOTE — Progress Notes (Signed)
BH MD/PA/NP OP Progress Note  02/24/2020 2:41 PM Berdie OgrenLorraine L Faison  MRN:  [id number] Interview was conducted by phone and I verified that I was speaking with the correct person using two identifiers. I discussed the limitations of evaluation and management by telemedicine and  the availability of in person appointments. Patient expressed understanding and agreed to proceed. Patient location - home; physician - home office.  Chief Complaint: "I was OK but then I lost strength in my legs".  HPI: 39 yo SAAF with no prior psychiatric history. In December 2020 she was hospitalized with COVID infection for a week. Within a week of discharge (in early January) she started to experience a variety of somatic symptoms: twitching, grunting, headaches, chest pain. She went to ED several times wher she ws told that these are likely sx of panic type anxiety. Her upper body/arm twitching seems to be triggered by heightened anxiety. It comes in waves and makes her very uncomfortable in social situations which she now actively avoids. She would also fear to go to bed worrying that "something may happen" while she is asleep. She has been seen by neurologist, had brain MRI done which was normal. She has been prescribed hydroxyzine 25 mg prn which she finds helpful. She takes melatonin 5-10 mg OTC which helps with sleep. She was tried on sertraline but reports that her anxiety spiked and she felt "wired" on it - it is listed as allergy which is not correct as these are not unusual adverse effects associated with this mediation. Of note is that there is no personal or family hx of psychiatric problems including anxiety and no hx of tick disorder in family. She has no hx of alcohol or drug abuse. She is of impression that some symptoms subsided after she received vaccine later on. We have added paroxetine 10 mg for anxiety. She recently "lost strength in both legs" and decided to stop taking paroxetine which until then was not  causing her any adverse effects.  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  Past Medical History:  Past Medical History:  Diagnosis Date  . Anxiety   . Asthma   . Chronic headaches   . COVID-19   . Gallstones   . Gastritis   . GERD (gastroesophageal reflux disease)   . Hyperlipemia   . Kidney stones     Past Surgical History:  Procedure Laterality Date  . CHOLECYSTECTOMY  feb 2006  . KNEE SURGERY Left     Family Psychiatric History: None.  Family History:  Family History  Problem Relation Age of Onset  . Diabetes Father   . Heart failure Father   . Heart attack Father   . Prostate cancer Maternal Grandfather   . Pancreatic cancer Maternal Grandfather   . Breast cancer Maternal Aunt   . Ovarian cancer Mother   . Diabetes Maternal Grandmother   . Kidney Stones Maternal Grandmother   . Heart attack Paternal Grandfather   . Heart attack Paternal Aunt   . Diabetes Maternal Uncle   . Asthma Son   . Colon cancer Neg Hx   . Tics Neg Hx   . Migraines Neg Hx     Social History:  Social History   Socioeconomic History  . Marital status: Single    Spouse name: Not on file  . Number of children: 1  . Years  of education: Not on file  . Highest education level: Associate degree: occupational, Scientist, product/process development, or vocational program  Occupational History  . Occupation: LPN  Tobacco Use  . Smoking status: Never Smoker  . Smokeless tobacco: Never Used  Vaping Use  . Vaping Use: Former  Substance and Sexual Activity  . Alcohol use: Yes    Comment: occasionally-social  . Drug use: Not Currently    Types: Marijuana    Comment: tried marijuana once in an edible but it made her freak out.   Marland Kitchen Sexual activity: Yes    Birth control/protection: I.U.D.  Other Topics Concern  . 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   Right handed   Caffeine: none    Social Determinants of Health   Financial Resource Strain:   . Difficulty of Paying Living Expenses:   Food Insecurity:   . Worried About Programme researcher, broadcasting/film/video in the Last Year:   . Barista in the Last Year:   Transportation Needs:   . Freight forwarder (Medical):   Marland Kitchen Lack of Transportation (Non-Medical):   Physical Activity:   . Days of Exercise per Week:   . Minutes of Exercise per Session:   Stress:   . Feeling of Stress :   Social Connections:   . Frequency of Communication with Friends and Family:   . Frequency of Social Gatherings with Friends and Family:   . Attends Religious Services:   . Active Member of Clubs or Organizations:   . Attends Banker Meetings:   Marland Kitchen Marital Status:     Allergies:  Allergies  Allergen Reactions  . Zoloft [Sertraline]     Pt states it made her crazy/worsened anxiety.     Metabolic Disorder Labs: Lab Results  Component Value Date   HGBA1C 5.4 02/12/2020   MPG 111 02/07/2016   No results found for: PROLACTIN Lab Results  Component Value Date   CHOL 169 02/07/2016   TRIG 51 07/03/2019   HDL 40 (L) 02/07/2016   CHOLHDL 4.2 02/07/2016   VLDL 33 (H) 02/07/2016   LDLCALC 96 02/07/2016   Lab Results  Component Value Date   TSH 0.912 02/12/2020   TSH 0.845 10/21/2019    Therapeutic Level Labs: No results found for: LITHIUM No results found for: VALPROATE No components found for:  CBMZ  Current Medications: Current Outpatient Medications  Medication Sig Dispense Refill  . dicyclomine (BENTYL) 10 MG capsule TAKE 1 CAPSULE (10 MG TOTAL) BY MOUTH IN THE MORNING, AT NOON, IN THE EVENING, AND AT BEDTIME. 360 capsule 1  . fluticasone (FLONASE) 50 MCG/ACT nasal spray Place 2 sprays into both nostrils daily. 9.9 mL 2  . Melatonin 10 MG TABS Take 10 mg by mouth at bedtime as needed (sleep). 30 tablet 2  . omeprazole (PRILOSEC) 40 MG capsule Take 1 capsule (40 mg  total) by mouth in the morning and at bedtime. 60 capsule 2  . ondansetron (ZOFRAN) 4 MG tablet Take 1 tablet (4 mg total) by mouth daily as needed for nausea or vomiting. 30 tablet 1  . PARoxetine (PAXIL) 10 MG tablet Take 1 tablet (10 mg total) by mouth daily for 14 days, THEN 2 tablets (20 mg total) daily. 74 tablet 0  . tizanidine (ZANAFLEX) 2 MG capsule Take 1 capsule (2 mg total) by mouth 3 (three) times daily as needed for muscle spasms.  30 capsule 2   No current facility-administered medications for this visit.     Psychiatric Specialty Exam: Review of Systems  Neurological: Positive for weakness.  Psychiatric/Behavioral: The patient is nervous/anxious.   All other systems reviewed and are negative.   Last menstrual period 02/03/2020.There is no height or weight on file to calculate BMI.  General Appearance: NA  Eye Contact:  NA  Speech:  Clear and Coherent and Normal Rate  Volume:  Normal  Mood:  Anxious  Affect:  NA  Thought Process:  Goal Directed  Orientation:  Full (Time, Place, and Person)  Thought Content: Logical   Suicidal Thoughts:  No  Homicidal Thoughts:  No  Memory:  Immediate;   Good Recent;   Good Remote;   Good  Judgement:  Good  Insight:  Fair  Psychomotor Activity:  NA  Concentration:  Concentration: Good  Recall:  Good  Fund of Knowledge: Good  Language: Good  Akathisia:  Negative  Handed:  Right  AIMS (if indicated): not done  Assets:  Communication Skills Desire for Improvement Financial Resources/Insurance Housing Social Support  ADL's:  Intact  Cognition: WNL  Sleep:  Fair   Screenings: GAD-7     Office Visit from 02/12/2020 in [facility] Office Visit from 11/13/2019 in [facility] Office Visit from 10/20/2019 in [facility] Office Visit from 09/18/2019 in [facility]  Total GAD-7 Score 4 4 10 17     PHQ2-9     Office  Visit from 02/12/2020 in [facility] Office Visit from 11/13/2019 in [facility] Office Visit from 10/20/2019 in [facility] Office Visit from 09/18/2019 in [facility] Office Visit from 01/18/2017 in [facility] Total Score 0 0 2 0 0  PHQ-9 Total Score 4 2 4  -- --       Assessment and Plan: 39 yo SAAF with no prior psychiatric history. In December 2020 she was hospitalized with COVID infection for a week. Within a week of discharge (in early January) she started to experience a variety of somatic symptoms: twitching, grunting, headaches, chest pain. She went to ED several times wher she ws told that these are likely sx of panic type anxiety. Her upper body/arm twitching seems to be triggered by heightened anxiety. It comes in waves and makes her very uncomfortable in social situations which she now actively avoids. She would also fear to go to bed worrying that "something may happen" while she is asleep. She has been seen by neurologist, had brain MRI done which was normal. She has been prescribed hydroxyzine 25 mg prn which she finds helpful. She takes melatonin 5-10 mg OTC which helps with sleep. She was tried on sertraline but reports that her anxiety spiked and she felt "wired" on it - it is listed as allergy which is not correct as these are not unusual adverse effects associated with this mediation. Of note is that there is no personal or family hx of psychiatric problems including anxiety and no hx of tick disorder in family. She has no hx of alcohol or drug abuse. She is of impression that some symptoms subsided after she received vaccine later on. We have added paroxetine 10 mg for anxiety. She recently "lost strength in both legs" and decided to stop taking paroxetine which until then was  not causing her any adverse effects.  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  I encouraged her to call our office to ask for a therapy/CBT appoinrment. The plan was discussed with patient who had an opportunity to ask questions and these were all answered. I spend 15 minutes in phone consultation with the patient.    Magdalene Patricia, MD 02/24/2020, 2:41 PM

## 2020-02-25 ENCOUNTER — Ambulatory Visit (HOSPITAL_BASED_OUTPATIENT_CLINIC_OR_DEPARTMENT_OTHER): Payer: BLUE CROSS/BLUE SHIELD | Attending: Family Medicine | Admitting: Internal Medicine

## 2020-03-03 ENCOUNTER — Encounter: Payer: BLUE CROSS/BLUE SHIELD | Admitting: Gastroenterology

## 2020-03-08 ENCOUNTER — Ambulatory Visit (INDEPENDENT_AMBULATORY_CARE_PROVIDER_SITE_OTHER): Payer: Medicaid Other | Admitting: Neurology

## 2020-03-08 ENCOUNTER — Ambulatory Visit: Payer: Medicaid Other | Admitting: Neurology

## 2020-03-08 DIAGNOSIS — Z0289 Encounter for other administrative examinations: Secondary | ICD-10-CM

## 2020-03-08 DIAGNOSIS — R29898 Other symptoms and signs involving the musculoskeletal system: Secondary | ICD-10-CM

## 2020-03-08 NOTE — Progress Notes (Signed)
See procedure note.

## 2020-03-08 NOTE — Progress Notes (Signed)
        Full Name: Melva Faux Gender: Female MRN #: [id number] Date of Birth: 22-Aug-1980    Visit Date: 03/08/2020 14:01 Age: 39 Years Examining Physician: Naomie Dean, MD  Referring Physician: Jonah Blue, MD Height: 5 feet 9 inch  History: Bilateral leg weakness Summary: EMG/NCS performed on the bilateral lower extremities. All nerves and muscles (as indicated in the following tables) were within normal limits.   Conclusion: This is a normal study of the bilateral lower extremities.  Naomie Dean, M.D.  [facility] [address], [address] [place], [place] [address] Tel: [phone] Fax: [phone]  Verbal informed consent was obtained from the patient, patient was informed of potential risk of procedure, including bruising, bleeding, hematoma formation, infection, muscle weakness, muscle pain, numbness, among others.        MNC    Nerve / Sites Muscle Latency Ref. Amplitude Ref. Rel Amp Segments Distance Velocity Ref. Area    ms ms mV mV %  cm m/s m/s mVms  R Peroneal - EDB     Ankle EDB 3.7 ?6.5 6.1 ?2.0 100 Ankle - EDB 9   14.8     Fib head EDB 9.6  5.2  85.2 Fib head - Ankle 31 53 ?44 15.9     Pop fossa EDB 11.3  5.2  99.8 Pop fossa - Fib head 10 59 ?44 13.7         Pop fossa - Ankle      R Tibial - AH     Ankle AH 4.1 ?5.8 5.3 ?4.0 100 Ankle - AH 9   11.0     Pop fossa AH 12.2  3.8  71.6 Pop fossa - Ankle 40 49 ?41 11.5         SNC    Nerve / Sites Rec. Site Peak Lat Ref.  Amp Ref. Segments Distance    ms ms V V  cm  R Sural - Ankle (Calf)     Calf Ankle 3.2 ?4.4 12 ?6 Calf - Ankle 14  R Superficial peroneal - Ankle     Lat leg Ankle 3.6 ?4.4 8 ?6 Lat leg - Ankle 14         F  Wave    Nerve F Lat Ref.   ms ms  R Tibial - AH 49.4 ?56.0       EMG Summary Table    Spontaneous MUAP Recruitment  Muscle IA Fib PSW Fasc Other Amp Dur. Poly Pattern  Bilateral Iliopsoas Normal None None None _______ Normal Normal Normal Normal   Bilateral Lumbar paraspinals (low) Normal None None None _______ Normal Normal Normal Normal  Bilateral  Vastus medialis Normal None None None _______ Normal Normal Normal Normal  Bilateral  Tibialis anterior Normal None None None _______ Normal Normal Normal Normal  Bilateral  Gluteus medius Normal None None None _______ Normal Normal Normal Normal  Bilateral  Gluteus maximus Normal None None None _______ Normal Normal Normal Normal  Bilateral  Biceps femoris (long head) Normal None None None _______ Normal Normal Normal Normal  Bilateral  Gastrocnemius (Medial head) Normal None None None _______ Normal Normal Normal Normal

## 2020-03-08 NOTE — Progress Notes (Signed)
History: A 39 year old lovely patient here for bilateral lower extremity weakness.  Patient has been seen in our clinic for tics and migraines however she recently had episodes of weakness and is here for evaluation for EMG nerve conduction study.  I reviewed emergency room notes, patient was seen for weakness on February 11, 2020, she had complained of bilateral weakness for the last week, she had been seen for the same at [facility] on February 03, 2020, she reported to the emergency room physician that her legs gave out on her, burning in her lower extremities and pain in her calfs, no swelling or warmth, also complicated by anxiety and panic attacks.  I reviewed vital signs were unremarkable, neurologic exam in the emergency room was 5 out of 5 strength, sensations intact, ambulating normally, normal reflexes.  Labs were unremarkable on July 20 visit, her primary care did order a vitamin B12 which was in the low normal range and patient was advised to start B12 injections.  Patient is here today, she reports she feels better, she started taking B12, maybe that was what was affecting her but since then she has been feeling much better, overall her affect is better, neurologic exam today was normal including 5 out of 5 strength, sensations intact, reflexes normal, patient appeared in no acute distress.  We also discussed EMG nerve conduction study which was normal.  Patient does say that she has been gaining weight and unable to lose weight no matter what she does we discussed and will send her to the healthy weight and wellness center.  I do think it is okay for patient to return to work, but she can follow-up with Jonah Blue on this as she is the one who is providing the paperwork.  Orders Placed This Encounter  Procedures  . Ambulatory referral to [facility]    I spent more than 25 minutes of face-to-face and non-face-to-face time with patient on the  1. Morbid obesity (HCC)    2. Leg weakness, bilateral    diagnosis.  This included previsit chart review, lab review, study review, order entry, electronic health record documentation, patient education on the different diagnostic and therapeutic options, counseling and coordination of care, risks and benefits of management, compliance, or risk factor reduction

## 2020-03-08 NOTE — Procedures (Signed)
        Full Name: Amy Mendoza Gender: Female MRN #: [id number] Date of Birth: 08/06/1980    Visit Date: 03/08/2020 14:01 Age: 38 Years Examining Physician: Cabe Lashley, MD  Referring Physician: Deborah johnson, MD Height: 5 feet 9 inch  History: Bilateral leg weakness Summary: EMG/NCS performed on the bilateral lower extremities. All nerves and muscles (as indicated in the following tables) were within normal limits.   Conclusion: This is a normal study of the bilateral lower extremities.  Draya Felker, M.D.  [facility] [address], [address] [place], [place] [address] Tel: [phone] Fax: [phone]  Verbal informed consent was obtained from the patient, patient was informed of potential risk of procedure, including bruising, bleeding, hematoma formation, infection, muscle weakness, muscle pain, numbness, among others.        MNC    Nerve / Sites Muscle Latency Ref. Amplitude Ref. Rel Amp Segments Distance Velocity Ref. Area    ms ms mV mV %  cm m/s m/s mVms  R Peroneal - EDB     Ankle EDB 3.7 ?6.5 6.1 ?2.0 100 Ankle - EDB 9   14.8     Fib head EDB 9.6  5.2  85.2 Fib head - Ankle 31 53 ?44 15.9     Pop fossa EDB 11.3  5.2  99.8 Pop fossa - Fib head 10 59 ?44 13.7         Pop fossa - Ankle      R Tibial - AH     Ankle AH 4.1 ?5.8 5.3 ?4.0 100 Ankle - AH 9   11.0     Pop fossa AH 12.2  3.8  71.6 Pop fossa - Ankle 40 49 ?41 11.5         SNC    Nerve / Sites Rec. Site Peak Lat Ref.  Amp Ref. Segments Distance    ms ms V V  cm  R Sural - Ankle (Calf)     Calf Ankle 3.2 ?4.4 12 ?6 Calf - Ankle 14  R Superficial peroneal - Ankle     Lat leg Ankle 3.6 ?4.4 8 ?6 Lat leg - Ankle 14         F  Wave    Nerve F Lat Ref.   ms ms  R Tibial - AH 49.4 ?56.0       EMG Summary Table    Spontaneous MUAP Recruitment  Muscle IA Fib PSW Fasc Other Amp Dur. Poly Pattern  Bilateral Iliopsoas Normal None None None _______ Normal Normal Normal Normal   Bilateral Lumbar paraspinals (low) Normal None None None _______ Normal Normal Normal Normal  Bilateral  Vastus medialis Normal None None None _______ Normal Normal Normal Normal  Bilateral  Tibialis anterior Normal None None None _______ Normal Normal Normal Normal  Bilateral  Gluteus medius Normal None None None _______ Normal Normal Normal Normal  Bilateral  Gluteus maximus Normal None None None _______ Normal Normal Normal Normal  Bilateral  Biceps femoris (long head) Normal None None None _______ Normal Normal Normal Normal  Bilateral  Gastrocnemius (Medial head) Normal None None None _______ Normal Normal Normal Normal      

## 2020-03-09 ENCOUNTER — Encounter (INDEPENDENT_AMBULATORY_CARE_PROVIDER_SITE_OTHER): Payer: Self-pay

## 2020-03-16 ENCOUNTER — Telehealth (INDEPENDENT_AMBULATORY_CARE_PROVIDER_SITE_OTHER): Payer: BLUE CROSS/BLUE SHIELD | Admitting: Psychiatry

## 2020-03-16 ENCOUNTER — Other Ambulatory Visit: Payer: Self-pay

## 2020-03-16 ENCOUNTER — Encounter: Payer: BLUE CROSS/BLUE SHIELD | Admitting: Gastroenterology

## 2020-03-16 DIAGNOSIS — Z8616 Personal history of COVID-19: Secondary | ICD-10-CM

## 2020-03-16 DIAGNOSIS — F064 Anxiety disorder due to known physiological condition: Secondary | ICD-10-CM

## 2020-03-16 DIAGNOSIS — F959 Tic disorder, unspecified: Secondary | ICD-10-CM | POA: Diagnosis not present

## 2020-03-16 MED ORDER — MELATONIN 10 MG PO TABS: 10.0000 mg | ORAL_TABLET | Freq: Every evening | ORAL | 2 refills | Status: DC | PRN

## 2020-03-16 MED ORDER — PAROXETINE HCL 20 MG PO TABS: 20.0000 mg | ORAL_TABLET | Freq: Every day | ORAL | 1 refills | Status: DC

## 2020-03-16 NOTE — Progress Notes (Signed)
BH MD/PA/NP OP Progress Note  03/16/2020 2:09 PM Amy Mendoza  MRN:  [id number] Interview was conducted by phone and I verified that I was speaking with the correct person using two identifiers. I discussed the limitations of evaluation and management by telemedicine and  the availability of in person appointments. Patient expressed understanding and agreed to proceed. Patient location - home; physician - home office.  Chief Complaint: "I am feeling better".  HPI: 39 yo SAAF with no prior psychiatric history. In December 2020 she was hospitalized with COVID infection for a week. Within a week of discharge (in early January) she started to experience a variety of somatic symptoms: twitching, grunting, headaches, chest pain. She went to ED several times wher she ws told that these are likely sx of panic type anxiety. Her upper body/arm twitching seems to be triggered by heightened anxiety. It comes in waves and makes her very uncomfortable in social situations which she now actively avoids. She would also fear to go to bed worrying that "something may happen" while she is asleep. She has been seen by neurologist, had brain MRI done which was normal. She has been prescribed hydroxyzine 25 mg prn which she finds helpful. She takes melatonin 5-10 mg OTC which helps with sleep. She was tried on sertraline but reports that her anxiety spiked and she felt "wired" on it - it is listed as allergy which is not correct as these are not unusual adverse effects associated with this mediation. Of note is that there is no personal or family hx of psychiatric problems including anxiety and no hx of tick disorder in family. She has no hx of alcohol or drug abuse. She is of impression that some symptoms subsided after she received vaccine later on. We have added paroxetine 10 mg for anxiety. She recently "lost strength in both legs" and decided to stop taking paroxetine which until then was not causing her any adverse  effects. She has since resumed taking paroxetine and had EMG done which was normal. Anxiety "much lower" lately.   Visit Diagnosis:    ICD-10-CM   1. 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  Past Medical History:  Past Medical History:  Diagnosis Date  . Anxiety   . Asthma   . Chronic headaches   . COVID-19   . Gallstones   . Gastritis   . GERD (gastroesophageal reflux disease)   . Hyperlipemia   . Kidney stones     Past Surgical History:  Procedure Laterality Date  . CHOLECYSTECTOMY  feb 2006  . KNEE SURGERY Left     Family Psychiatric History: None.  Family History:  Family History  Problem Relation Age of Onset  . Diabetes Father   . Heart failure Father   . Heart attack Father   . Prostate cancer Maternal Grandfather   . Pancreatic cancer Maternal Grandfather   . Breast cancer Maternal Aunt   . Ovarian cancer Mother   . Diabetes Maternal Grandmother   . Kidney Stones Maternal Grandmother   . Heart attack Paternal Grandfather   . Heart attack Paternal Aunt   . Diabetes Maternal Uncle   . Asthma Son   . Colon cancer Neg Hx   . Tics Neg Hx   . Migraines Neg Hx     Social History:  Social History   Socioeconomic History  . Marital status: Single    Spouse name: Not on  file  . Number of children: 1  . Years of education: Not on file  . Highest education level: Associate degree: occupational, Scientist, product/process developmenttechnical, or vocational program  Occupational History  . Occupation: LPN  Tobacco Use  . Smoking status: Never Smoker  . Smokeless tobacco: Never Used  Vaping Use  . Vaping Use: Former  Substance and Sexual Activity  . Alcohol use: Yes    Comment: occasionally-social  . Drug use: Not Currently    Types: Marijuana    Comment: tried marijuana once in an edible but it made her freak out.   Marland Kitchen. Sexual activity: Yes    Birth control/protection: I.U.D.  Other Topics Concern  . 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   Right handed   Caffeine: none    Social Determinants of Health   Financial Resource Strain:   . Difficulty of Paying Living Expenses: Not on file  Food Insecurity:   . Worried About Programme researcher, broadcasting/film/videounning Out of Food in the Last Year: Not on file  . Ran Out of Food in the Last Year: Not on file  Transportation Needs:   . Lack of Transportation (Medical): Not on file  . Lack of Transportation (Non-Medical): Not on file  Physical Activity:   . Days of Exercise per Week: Not on file  . Minutes of Exercise per Session: Not on file  Stress:   . Feeling of Stress : Not on file  Social Connections:   . Frequency of Communication with Friends and Family: Not on file  . Frequency of Social Gatherings with Friends and Family: Not on file  . Attends Religious Services: Not on file  . Active Member of Clubs or Organizations: Not on file  . Attends BankerClub or Organization Meetings: Not on file  . Marital Status: Not on file    Allergies:  Allergies  Allergen Reactions  . Zoloft [Sertraline]     Pt states it made her crazy/worsened anxiety.     Metabolic Disorder Labs: Lab Results  Component Value Date   HGBA1C 5.4 02/12/2020   MPG 111 02/07/2016   No results found for: PROLACTIN Lab Results  Component Value Date   CHOL 169 02/07/2016   TRIG 51 07/03/2019   HDL 40 (L) 02/07/2016   CHOLHDL 4.2 02/07/2016   VLDL 33 (H) 02/07/2016   LDLCALC 96 02/07/2016   Lab Results  Component Value Date   TSH 0.912 02/12/2020   TSH 0.845 10/21/2019    Therapeutic Level Labs: No results found for: LITHIUM No results found for: VALPROATE No components found for:  CBMZ  Current Medications: Current Outpatient Medications  Medication Sig Dispense Refill  . dicyclomine (BENTYL) 10 MG capsule TAKE 1 CAPSULE (10 MG TOTAL) BY MOUTH IN THE MORNING, AT NOON, IN THE EVENING, AND AT BEDTIME. 360 capsule 1  . fluticasone  (FLONASE) 50 MCG/ACT nasal spray Place 2 sprays into both nostrils daily. 9.9 mL 2  . [START ON 04/23/2020] Melatonin 10 MG TABS Take 10 mg by mouth at bedtime as needed (sleep). 30 tablet 2  . omeprazole (PRILOSEC) 40 MG capsule Take 1 capsule (40 mg total) by mouth in the morning and at bedtime. 60 capsule 2  . ondansetron (ZOFRAN) 4 MG tablet Take 1 tablet (4 mg total) by mouth daily as needed for nausea or vomiting. 30 tablet 1  . PARoxetine (PAXIL) 20 MG tablet Take 1 tablet (20 mg  total) by mouth daily. 90 tablet 1  . tizanidine (ZANAFLEX) 2 MG capsule Take 1 capsule (2 mg total) by mouth 3 (three) times daily as needed for muscle spasms. 30 capsule 2   No current facility-administered medications for this visit.    Psychiatric Specialty Exam: Review of Systems  Psychiatric/Behavioral: Positive for sleep disturbance.  All other systems reviewed and are negative.   There were no vitals taken for this visit.There is no height or weight on file to calculate BMI.  General Appearance: NA  Eye Contact:  NA  Speech:  Clear and Coherent and Normal Rate  Volume:  Normal  Mood:  Euthymic  Affect:  NA  Thought Process:  Goal Directed and Linear  Orientation:  Full (Time, Place, and Person)  Thought Content: Logical   Suicidal Thoughts:  No  Homicidal Thoughts:  No  Memory:  Immediate;   Good Recent;   Good Remote;   Good  Judgement:  Good  Insight:  Fair  Psychomotor Activity:  NA  Concentration:  Concentration: Good  Recall:  Good  Fund of Knowledge: Good  Language: Good  Akathisia:  Negative  Handed:  Right  AIMS (if indicated): not done  Assets:  Communication Skills Desire for Improvement Financial Resources/Insurance Housing Talents/Skills  ADL's:  Intact  Cognition: WNL  Sleep:  Fair   Screenings: GAD-7     Office Visit from 02/12/2020 in [facility] Office Visit from 11/13/2019 in [facility] Office Visit  from 10/20/2019 in [facility] Office Visit from 09/18/2019 in [facility]  Total GAD-7 Score 4 4 10 17     PHQ2-9     Office Visit from 02/12/2020 in [facility] Office Visit from 11/13/2019 in [facility] Office Visit from 10/20/2019 in [facility] Office Visit from 09/18/2019 in [facility] Office Visit from 01/18/2017 in Primary Care at [facility] Total Score 0 0 2 0 0  PHQ-9 Total Score 4 2 4  -- --       Assessment and Plan: 39 yo SAAF with no prior psychiatric history. In December 2020 she was hospitalized with COVID infection for a week. Within a week of discharge (in early January) she started to experience a variety of somatic symptoms: twitching, grunting, headaches, chest pain. She went to ED several times wher she ws told that these are likely sx of panic type anxiety. Her upper body/arm twitching seems to be triggered by heightened anxiety. It comes in waves and makes her very uncomfortable in social situations which she now actively avoids. She would also fear to go to bed worrying that "something may happen" while she is asleep. She has been seen by neurologist, had brain MRI done which was normal. She has been prescribed hydroxyzine 25 mg prn which she finds helpful. She takes melatonin 5-10 mg OTC which helps with sleep. She was tried on sertraline but reports that her anxiety spiked and she felt "wired" on it - it is listed as allergy which is not correct as these are not unusual adverse effects associated with this mediation. Of note is that there is no personal or family hx of psychiatric problems including anxiety and no hx of tick disorder in family. She has no hx of alcohol or drug abuse. She is of impression that some symptoms subsided after she  received vaccine later on. We have added paroxetine 10 mg for anxiety.  She recently "lost strength in both legs" and decided to stop taking paroxetine which until then was not causing her any adverse effects. She has since resumed taking paroxetine and had EMG done which was normal. Anxiety "much lower" lately.  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 Next appointment in 3 months. The plan was discussed with patient who had an opportunity to ask questions and these were all answered. I spend53minutes in phone consultation with the patient.    Magdalene Patricia, MD 03/16/2020, 2:09 PM

## 2020-03-28 NOTE — Telephone Encounter (Signed)
FYi

## 2020-04-14 ENCOUNTER — Other Ambulatory Visit: Payer: Self-pay

## 2020-04-14 ENCOUNTER — Ambulatory Visit (AMBULATORY_SURGERY_CENTER): Payer: Self-pay | Admitting: *Deleted

## 2020-04-14 VITALS — Ht 68.25 in | Wt 275.0 lb

## 2020-04-14 DIAGNOSIS — R1013 Epigastric pain: Secondary | ICD-10-CM

## 2020-04-14 NOTE — Progress Notes (Signed)
Pt completed covid vaccines 10-24-19  Pt is aware that care partner will wait in the car during procedure; if they feel like they will be too hot or cold to wait in the car; they may wait in the 4 th floor lobby. Patient is aware to bring only one care partner. We want them to wear a mask (we do not have any that we can provide them), practice social distancing, and we will check their temperatures when they get here.  I did remind the patient that their care partner needs to stay in the parking lot the entire time and have a cell phone available, we will call them when the pt is ready for discharge. Patient will wear mask into building.    No trouble with anesthesia, difficulty with intubation or hx/fam hx of malignant hyperthermia per pt   No egg or soy allergy  No home oxygen use   No medications for weight loss taken  emmi information given- via MyChart  Pt denies constipation issues

## 2020-04-22 ENCOUNTER — Encounter: Payer: Self-pay | Admitting: Family Medicine

## 2020-04-22 ENCOUNTER — Ambulatory Visit: Payer: BLUE CROSS/BLUE SHIELD | Admitting: Family Medicine

## 2020-04-22 NOTE — Progress Notes (Deleted)
PATIENT: Amy Mendoza DOB: 1980/08/28  REASON FOR VISIT: follow up HISTORY FROM: patient  No chief complaint on file.    HISTORY OF PRESENT ILLNESS: Today 04/22/20 Amy Mendoza is a 39 y.o. female here today for follow up. We started tizanidine at last visit for concerns of TMJ and tension headaches. EMG 8/23 was normal.   HISTORY: (copied from my note on 01/21/2020)  Amy Mendoza is a 39 y.o. female here today for follow up. She reports that twitching has improved significantly. She reports a "vocalization" when she has a headache or stomach pains. She describes a moan or groan that "comes out different with one pain and a different way with the other". She has not had any migrainous headaches. She describes a tension type pain. Pain is bilateral in the cheek and jaw area. She reports significant jaw pain after work. 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 She has taken OTC allergy medications that have helped. She is seeing GI for stomach aches. She was started on Bentyl but not sure if it is helping. She has an appt with psychiatry this Saturday. She does not feel symptoms are related to anxiety.   HISTORY: (copied from Dr Lucia Gaskins note on 10/21/2019)   IAX:Amy Mendoza L [name] a 38 y.o.femalehere as requested by Marcine Matar, MDfor tic disorder. PMHx anxiety, obesity. Reviewed Dr. Henriette Combs notes: she developed body twitching in early January, the twitching comes "in waves" some days no twitching at all, she stopped drinking caffeine. She was started on zoloft. I reviewed ED notes: She presented to the ED 3/9 for persistent tics and anxiety disorder, tics controlled with cognitive behavioral therapy and breathing exercises and they thought she would benefit from behavioral therapy. She also presented 3/24 for migraine to the ER and then again on 3/29. She was in the ED 3/31 as well for migraine, facial pressure, chest pains, grunting and  clearing her throat during exam which she describes as "fits". She then presented to the ER again 10/19/19 for facial pressure and pain, taking ketorolac and reglan, and also stated that many symptoms likely due to anxiety, she had maxillary tenderness and dxed with a sinus headache and told to use flonase, antihistamines and nasal rinses. She has also visited the ED at [facility] as well on 3/30 with similar headache symptoms, diagnosed with acute tension-type headache and motor and vocal tics, exam was normal per notes. She has also been evaluated by cardiology for palpitations in the past per review of records as well.  She is here alone. We discussed due to limited time we will focus on migraines today, I will address tic disorder briefly but patient should really be seen in psychiatry for this disorder as they can provide cognitive behavioral therapy or other therapy and can also treat with medication. She has a hx of migraines around menses for many years episodic but after covid started having significant headaches and pressure in the head and face, she had Covid in [place] and was hospitalized, she was home on Dec 22nd, the following week she was feeling better but still with significant fatigue. January 8th she went to the emergency room, she had chest pain, felt like she was dying, they thought she was having an anxiety atack she had multiple visits for this. In February headaches worsened, has progressively gotten worse and she went to the ED many times for headache as well. She feels like electric shocks in  the head, pressure in the head severe, worst headaches of life and "brain zaps". Sensory changes left side of the face, light and sound sensitivity. Working made it worse. Headaches/migraines can last days, going into a dark room helps, pulsating/pounding/throbbing, pressure in the face and jaw, positional worse with bending over, blurry vision. She is having the headaches every day. Shooting pain through  the nose. Headaches are throbbing and all over, can be unilateral in the temples also in the back of the head. Never been on migraine medications in the past. She says her tics are in arms and shoulders, feels like it does it on its own, jerking movements, she makes vocalizations.  Reviewed notes, labs and imaging from outside physicians, which showed: see above, cbc and cmp unremarkable 10/13/2019   REVIEW OF SYSTEMS: Out of a complete 14 system review of symptoms, the patient complains only of the following symptoms, and all other reviewed systems are negative.  ALLERGIES: Allergies  Allergen Reactions  . Zoloft [Sertraline]     Pt states it made her crazy/worsened anxiety.     HOME MEDICATIONS: Outpatient Medications Prior to Visit  Medication Sig Dispense Refill  . dicyclomine (BENTYL) 10 MG capsule TAKE 1 CAPSULE (10 MG TOTAL) BY MOUTH IN THE MORNING, AT NOON, IN THE EVENING, AND AT BEDTIME. 360 capsule 1  . fluticasone (FLONASE) 50 MCG/ACT nasal spray Place 2 sprays into both nostrils daily. 9.9 mL 2  . hydrOXYzine (ATARAX/VISTARIL) 10 MG tablet Take 10 mg by mouth 3 (three) times daily as needed. Takes daily    . [START ON 04/23/2020] Melatonin 10 MG TABS Take 10 mg by mouth at bedtime as needed (sleep). 30 tablet 2  . omeprazole (PRILOSEC) 40 MG capsule Take 1 capsule (40 mg total) by mouth in the morning and at bedtime. (Patient not taking: Reported on 04/14/2020) 60 capsule 2  . ondansetron (ZOFRAN) 4 MG tablet Take 1 tablet (4 mg total) by mouth daily as needed for nausea or vomiting. 30 tablet 1  . PARoxetine (PAXIL) 20 MG tablet Take 1 tablet (20 mg total) by mouth daily. 90 tablet 1  . tizanidine (ZANAFLEX) 2 MG capsule Take 1 capsule (2 mg total) by mouth 3 (three) times daily as needed for muscle spasms. (Patient not taking: Reported on 04/14/2020) 30 capsule 2   No facility-administered medications prior to visit.    PAST MEDICAL HISTORY: Past Medical History:  Diagnosis  Date  . Anxiety   . Asthma   . Chronic headaches   . COVID-19   . Gallstones   . Gastritis   . GERD (gastroesophageal reflux disease)   . Hyperlipemia    no meds  . Kidney stones     PAST SURGICAL HISTORY: Past Surgical History:  Procedure Laterality Date  . CHOLECYSTECTOMY  feb 2006  . COLONOSCOPY    . KNEE SURGERY Left     FAMILY HISTORY: Family History  Problem Relation Age of Onset  . Diabetes Father   . Heart failure Father   . Heart attack Father   . Prostate cancer Maternal Grandfather   . Pancreatic cancer Maternal Grandfather   . Colon cancer Maternal Grandfather   . Breast cancer Maternal Aunt   . Ovarian cancer Mother   . Diabetes Maternal Grandmother   . Kidney Stones Maternal Grandmother   . Heart attack Paternal Grandfather   . Heart attack Paternal Aunt   . Diabetes Maternal Uncle   . Asthma Son   . Tics Neg Hx   .  Migraines Neg Hx   . Esophageal cancer Neg Hx   . Rectal cancer Neg Hx   . Stomach cancer Neg Hx     SOCIAL HISTORY: Social History   Socioeconomic History  . Marital status: Single    Spouse name: Not on file  . Number of children: 1  . Years of education: Not on file  . Highest education level: Associate degree: occupational, Scientist, product/process developmenttechnical, or vocational program  Occupational History  . Occupation: LPN  Tobacco Use  . Smoking status: Never Smoker  . Smokeless tobacco: Never Used  Vaping Use  . Vaping Use: Former  Substance and Sexual Activity  . Alcohol use: Yes    Comment: occasionally-social  . Drug use: Not Currently    Types: Marijuana    Comment: tried marijuana once in an edible but it made her freak out.   Marland Kitchen. Sexual activity: Yes    Birth control/protection: I.U.D.  Other Topics Concern  . 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   Right handed   Caffeine: none    Social Determinants of Health   Financial Resource Strain:   . Difficulty of  Paying Living Expenses: Not on file  Food Insecurity:   . Worried About Programme researcher, broadcasting/film/videounning Out of Food in the Last Year: Not on file  . Ran Out of Food in the Last Year: Not on file  Transportation Needs:   . Lack of Transportation (Medical): Not on file  . Lack of Transportation (Non-Medical): Not on file  Physical Activity:   . Days of Exercise per Week: Not on file  . Minutes of Exercise per Session: Not on file  Stress:   . Feeling of Stress : Not on file  Social Connections:   . Frequency of Communication with Friends and Family: Not on file  . Frequency of Social Gatherings with Friends and Family: Not on file  . Attends Religious Services: Not on file  . Active Member of Clubs or Organizations: Not on file  . Attends BankerClub or Organization Meetings: Not on file  . Marital Status: Not on file  Intimate Partner Violence:   . Fear of Current or Ex-Partner: Not on file  . Emotionally Abused: Not on file  . Physically Abused: Not on file  . Sexually Abused: Not on file      PHYSICAL EXAM  There were no vitals filed for this visit. There is no height or weight on file to calculate BMI.  Generalized: Well developed, in no acute distress  Cardiology: normal rate and rhythm, no murmur noted Respiratory: clear to auscultation bilaterally  Neurological examination  Mentation: Alert oriented to time, place, history taking. Follows all commands speech and language fluent Cranial nerve II-XII: Pupils were equal round reactive to light. Extraocular movements were full, visual field were full on confrontational test. Facial sensation and strength were normal. Uvula tongue midline. Head turning and shoulder shrug  were normal and symmetric. Motor: The motor testing reveals 5 over 5 strength of all 4 extremities. Good symmetric motor tone is noted throughout.  Sensory: Sensory testing is intact to soft touch on all 4 extremities. No evidence of extinction is noted.  Coordination: Cerebellar testing  reveals good finger-nose-finger and heel-to-shin bilaterally.  Gait and station: Gait is normal. Tandem gait is normal. Romberg is negative. No drift is seen.  Reflexes: Deep tendon reflexes are symmetric and normal bilaterally.   DIAGNOSTIC DATA (  LABS, IMAGING, TESTING) - I reviewed patient records, labs, notes, testing and imaging myself where available.  No flowsheet data found.   Lab Results  Component Value Date   WBC 5.7 02/03/2020   HGB 13.0 02/03/2020   HCT 40.0 02/03/2020   MCV 83.9 02/03/2020   PLT 299 02/03/2020      Component Value Date/Time   NA 134 (L) 02/03/2020 1608   NA 142 09/18/2019 1605   K 3.7 02/03/2020 1608   CL 103 02/03/2020 1608   CO2 24 02/03/2020 1608   GLUCOSE 106 (H) 02/03/2020 1608   BUN 9 02/03/2020 1608   BUN 7 09/18/2019 1605   CREATININE 1.08 (H) 02/03/2020 1608   CREATININE 0.84 02/07/2016 1719   CALCIUM 9.1 02/03/2020 1608   PROT 8.3 (H) 02/03/2020 1608   ALBUMIN 4.1 02/03/2020 1608   AST 21 02/03/2020 1608   ALT 29 02/03/2020 1608   ALKPHOS 66 02/03/2020 1608   BILITOT 0.7 02/03/2020 1608   GFRNONAA >60 02/03/2020 1608   GFRAA >60 02/03/2020 1608   Lab Results  Component Value Date   CHOL 169 02/07/2016   HDL 40 (L) 02/07/2016   LDLCALC 96 02/07/2016   TRIG 51 07/03/2019   CHOLHDL 4.2 02/07/2016   Lab Results  Component Value Date   HGBA1C 5.4 02/12/2020   Lab Results  Component Value Date   VITAMINB12 339 02/12/2020   Lab Results  Component Value Date   TSH 0.912 02/12/2020       ASSESSMENT AND PLAN 39 y.o. year old female  has a past medical history of Anxiety, Asthma, Chronic headaches, COVID-19, Gallstones, Gastritis, GERD (gastroesophageal reflux disease), Hyperlipemia, and Kidney stones. here with ***  No diagnosis found.     No orders of the defined types were placed in this encounter.    No orders of the defined types were placed in this encounter.     I spent 15 minutes with the patient. 50% of  this time was spent counseling and educating patient on plan of care and medications.    Shawnie Dapper, FNP-C 04/22/2020, 9:41 AM [facility] [address] [place], [place] [address] [phone]

## 2020-04-25 ENCOUNTER — Encounter: Payer: Self-pay | Admitting: Internal Medicine

## 2020-04-26 ENCOUNTER — Ambulatory Visit: Payer: BLUE CROSS/BLUE SHIELD | Attending: Family | Admitting: Family

## 2020-04-26 ENCOUNTER — Other Ambulatory Visit: Payer: Self-pay | Admitting: Family

## 2020-04-26 ENCOUNTER — Other Ambulatory Visit: Payer: Self-pay

## 2020-04-26 DIAGNOSIS — R300 Dysuria: Secondary | ICD-10-CM

## 2020-04-26 MED ORDER — NITROFURANTOIN MONOHYD MACRO 100 MG PO CAPS: 100.0000 mg | ORAL_CAPSULE | Freq: Two times a day (BID) | ORAL | 0 refills | Status: DC

## 2020-04-26 MED FILL — NITROFURANTOIN MONO-MCR 100: 100 | 5 days supply | Qty: 10 | Fill #0

## 2020-04-26 NOTE — Patient Instructions (Signed)
Urinalysis to screen for urinary tract infection. Macrobid if results are positive for UTI. Follow-up with primary physician as needed. Urinary Tract Infection, Adult A urinary tract infection (UTI) is an infection of any part of the urinary tract. The urinary tract includes:  The kidneys.  The ureters.  The bladder.  The urethra. These organs make, store, and get rid of pee (urine) in the body. What are the causes? This is caused by germs (bacteria) in your genital area. These germs grow and cause swelling (inflammation) of your urinary tract. What increases the risk? You are more likely to develop this condition if:  You have a small, thin tube (catheter) to drain pee.  You cannot control when you pee or poop (incontinence).  You are female, and: ? You use these methods to prevent pregnancy:  A medicine that kills sperm (spermicide).  A device that blocks sperm (diaphragm). ? You have low levels of a female hormone (estrogen). ? You are pregnant.  You have genes that add to your risk.  You are sexually active.  You take antibiotic medicines.  You have trouble peeing because of: ? A prostate that is bigger than normal, if you are female. ? A blockage in the part of your body that drains pee from the bladder (urethra). ? A kidney stone. ? A nerve condition that affects your bladder (neurogenic bladder). ? Not getting enough to drink. ? Not peeing often enough.  You have other conditions, such as: ? Diabetes. ? A weak disease-fighting system (immune system). ? Sickle cell disease. ? Gout. ? Injury of the spine. What are the signs or symptoms? Symptoms of this condition include:  Needing to pee right away (urgently).  Peeing often.  Peeing small amounts often.  Pain or burning when peeing.  Blood in the pee.  Pee that smells bad or not like normal.  Trouble peeing.  Pee that is cloudy.  Fluid coming from the vagina, if you are female.  Pain in the  belly or lower back. Other symptoms include:  Throwing up (vomiting).  No urge to eat.  Feeling mixed up (confused).  Being tired and grouchy (irritable).  A fever.  Watery poop (diarrhea). How is this treated? This condition may be treated with:  Antibiotic medicine.  Other medicines.  Drinking enough water. Follow these instructions at home:  Medicines  Take over-the-counter and prescription medicines only as told by your doctor.  If you were prescribed an antibiotic medicine, take it as told by your doctor. Do not stop taking it even if you start to feel better. General instructions  Make sure you: ? Pee until your bladder is empty. ? Do not hold pee for a long time. ? Empty your bladder after sex. ? Wipe from front to back after pooping if you are a female. Use each tissue one time when you wipe.  Drink enough fluid to keep your pee pale yellow.  Keep all follow-up visits as told by your doctor. This is important. Contact a doctor if:  You do not get better after 1-2 days.  Your symptoms go away and then come back. Get help right away if:  You have very bad back pain.  You have very bad pain in your lower belly.  You have a fever.  You are sick to your stomach (nauseous).  You are throwing up. Summary  A urinary tract infection (UTI) is an infection of any part of the urinary tract.  This condition is caused by  germs in your genital area.  There are many risk factors for a UTI. These include having a small, thin tube to drain pee and not being able to control when you pee or poop.  Treatment includes antibiotic medicines for germs.  Drink enough fluid to keep your pee pale yellow. This information is not intended to replace advice given to you by your health care provider. Make sure you discuss any questions you have with your health care provider. Document Revised: 06/20/2018 Document Reviewed: 01/10/2018 Elsevier Patient Education  2020  [employer].

## 2020-04-26 NOTE — Progress Notes (Signed)
Virtual Visit via Telephone Note  I connected with Amy Mendoza, on 04/26/2020 at 9:10 AM by telephone due to the COVID-19 pandemic and verified that I am speaking with the correct person using two identifiers.  Due to current restrictions/limitations of in-office visits due to the COVID-19 pandemic, this scheduled clinical appointment was converted to a telehealth visit.   Consent: I discussed the limitations, risks, security and privacy concerns of performing an evaluation and management service by telephone and the availability of in person appointments. I also discussed with the patient that there may be a patient responsible charge related to this service. The patient expressed understanding and agreed to proceed.   Location of Patient: Home  Location of Provider: [facility]   Persons participating in Telemedicine visit: Leary Roca, NP Margorie John, CMA  History of Present Illness: Amy Deltoro. Mendoza is a 39 year-old female who presents with urinary tract infection symptoms.   1. URINARY SYMPTOMS: Dysuria: yes Urinary frequency: yes Urgency: yes Small volume voids: yes Symptom severity: yes Urinary incontinence: no Foul odor: no Hematuria: no Abdominal pain: no Back pain: a little Suprapubic pain/pressure: yes Flank pain: 1 week ago but none since then Fever:  no Vomiting: no Relief with cranberry juice: no Status: improved some since symptoms began 2 days ago Previous urinary tract infection: yes Recurrent urinary tract infection: does have history of UTI Treatments attempted: cranberry and increasing fluids     Past Medical History:  Diagnosis Date  . Anxiety   . Asthma   . Chronic headaches   . COVID-19   . Gallstones   . Gastritis   . GERD (gastroesophageal reflux disease)   . Hyperlipemia    no meds  . Kidney stones    Allergies  Allergen Reactions  . Zoloft [Sertraline]     Pt states it  made her crazy/worsened anxiety.     Current Outpatient Medications on File Prior to Visit  Medication Sig Dispense Refill  . dicyclomine (BENTYL) 10 MG capsule TAKE 1 CAPSULE (10 MG TOTAL) BY MOUTH IN THE MORNING, AT NOON, IN THE EVENING, AND AT BEDTIME. 360 capsule 1  . fluticasone (FLONASE) 50 MCG/ACT nasal spray Place 2 sprays into both nostrils daily. 9.9 mL 2  . hydrOXYzine (ATARAX/VISTARIL) 10 MG tablet Take 10 mg by mouth 3 (three) times daily as needed. Takes daily    . Melatonin 10 MG TABS Take 10 mg by mouth at bedtime as needed (sleep). 30 tablet 2  . omeprazole (PRILOSEC) 40 MG capsule Take 1 capsule (40 mg total) by mouth in the morning and at bedtime. (Patient not taking: Reported on 04/14/2020) 60 capsule 2  . ondansetron (ZOFRAN) 4 MG tablet Take 1 tablet (4 mg total) by mouth daily as needed for nausea or vomiting. 30 tablet 1  . PARoxetine (PAXIL) 20 MG tablet Take 1 tablet (20 mg total) by mouth daily. 90 tablet 1  . tizanidine (ZANAFLEX) 2 MG capsule Take 1 capsule (2 mg total) by mouth 3 (three) times daily as needed for muscle spasms. (Patient not taking: Reported on 04/14/2020) 30 capsule 2   No current facility-administered medications on file prior to visit.    Observations/Objective: Alert and oriented x 3. Not in acute distress. Physical examination not completed as this is a telemedicine visit.  Assessment and Plan: 1. Dysuria: - Patient with urinary symptoms likely related to urinary tract infection (UTI). Also, with history of UTI's. - Patient will come  in today to have urinalysis obtained.  - Empirical treatment with Nitrofurantoin for possible UTI. Counseled patient to not take antibiotic unless her results are positive for UTI. Patient verbalized understanding.  - Follow-up with primary physician as needed. - POCT URINALYSIS DIP (CLINITEK) - nitrofurantoin, macrocrystal-monohydrate, (MACROBID) 100 MG capsule; Take 1 capsule (100 mg total) by mouth 2 (two)  times daily for 5 days.  Dispense: 10 capsule; Refill: 0  Follow Up Instructions: Follow-up with primary physician as needed.   Patient was given clear instructions to go to Emergency Department or return to medical center if symptoms don't improve, worsen, or new problems develop.The patient verbalized understanding.  I discussed the assessment and treatment plan with the patient. The patient was provided an opportunity to ask questions and all were answered. The patient agreed with the plan and demonstrated an understanding of the instructions.   The patient was advised to call back or seek an in-person evaluation if the symptoms worsen or if the condition fails to improve as anticipated.   I provided 6 minutes total of non-face-to-face time during this encounter including median intraservice time, reviewing previous notes, labs, imaging, medications, management and patient verbalized understanding.    Rema Fendt, NP  [facility] [place], [place] [phone]   04/26/2020, 9:10 AM

## 2020-04-29 NOTE — Progress Notes (Signed)
CMA contacted patient to confirm that she would return to clinic for urinalysis. Patient did not return to clinic for sample.

## 2020-04-30 ENCOUNTER — Other Ambulatory Visit: Payer: Self-pay

## 2020-04-30 ENCOUNTER — Encounter: Payer: Self-pay | Admitting: Gastroenterology

## 2020-04-30 ENCOUNTER — Ambulatory Visit (AMBULATORY_SURGERY_CENTER): Payer: BLUE CROSS/BLUE SHIELD | Admitting: Gastroenterology

## 2020-04-30 VITALS — BP 132/78 | HR 74 | Temp 98.0°F | Resp 18 | Ht 68.25 in | Wt 275.0 lb

## 2020-04-30 DIAGNOSIS — K2951 Unspecified chronic gastritis with bleeding: Secondary | ICD-10-CM

## 2020-04-30 DIAGNOSIS — K2970 Gastritis, unspecified, without bleeding: Secondary | ICD-10-CM

## 2020-04-30 DIAGNOSIS — R1013 Epigastric pain: Secondary | ICD-10-CM

## 2020-04-30 MED ORDER — SODIUM CHLORIDE 0.9 % IV SOLN: 500.0000 mL | INTRAVENOUS | Status: DC

## 2020-04-30 NOTE — Op Note (Signed)
[facility] Patient Name: Amy Mendoza Procedure Date: 04/30/2020 2:20 PM MRN: [id number] Endoscopist: Rachael Fee , MD Age: 39 Referring MD:  Date of Birth: [birth year] Gender: Female Account #: [id number] Procedure:                Upper GI endoscopy Indications:              Generalized abdominal pain Medicines:                Monitored Anesthesia Care Procedure:                Pre-Anesthesia Assessment:                           - Prior to the procedure, a History and Physical                            was performed, and patient medications and                            allergies were reviewed. The patient's tolerance of                            previous anesthesia was also reviewed. The risks                            and benefits of the procedure and the sedation                            options and risks were discussed with the patient.                            All questions were answered, and informed consent                            was obtained. Prior Anticoagulants: The patient has                            taken no previous anticoagulant or antiplatelet                            agents. ASA Grade Assessment: III - A patient with                            severe systemic disease. After reviewing the risks                            and benefits, the patient was deemed in                            satisfactory condition to undergo the procedure.                           After obtaining informed consent, the endoscope was  passed under direct vision. Throughout the                            procedure, the patient's blood pressure, pulse, and                            oxygen saturations were monitored continuously. The                            Endoscope was introduced through the mouth, and                            advanced to the second part of duodenum. The upper                            GI endoscopy was  accomplished without difficulty.                            The patient tolerated the procedure well. Scope In: Scope Out: Findings:                 Mild inflammation characterized by erythema and                            friability was found in the gastric antrum.                            Biopsies were taken with a cold forceps for                            histology.                           The exam was otherwise without abnormality. Complications:            No immediate complications. Estimated blood loss:                            None. Estimated Blood Loss:     Estimated blood loss: none. Impression:               - Mild, non-specific gastritis. Biopsied to check                            for H. pylori.                           - The examination was otherwise normal. Recommendation:           - Patient has a contact number available for                            emergencies. The signs and symptoms of potential                            delayed complications were discussed with the  patient. Return to normal activities tomorrow.                            Written discharge instructions were provided to the                            patient.                           - Resume previous diet.                           - Continue present medications.                           - Await pathology results. Rachael Fee, MD 04/30/2020 2:46:25 PM This report has been signed electronically.

## 2020-04-30 NOTE — Progress Notes (Signed)
Called to room to assist during endoscopic procedure.  Patient ID and intended procedure confirmed with present staff. Received instructions for my participation in the procedure from the performing physician.  

## 2020-04-30 NOTE — Progress Notes (Signed)
Lidocaine 100mg IV given to blunt airway relfexes 

## 2020-04-30 NOTE — Progress Notes (Signed)
Pt Drowsy. VSS. To PACU, report to RN. No anesthetic complications noted.  

## 2020-04-30 NOTE — Patient Instructions (Signed)
Read all of the handouts given to you by your recovery room nurse.  Thank-you for choosing us for your healthcare needs today.  YOU HAD AN ENDOSCOPIC PROCEDURE TODAY AT THE [facility]:   Refer to the procedure report that was given to you for any specific questions about what was found during the examination.  If the procedure report does not answer your questions, please call your gastroenterologist to clarify.  If you requested that your care partner not be given the details of your procedure findings, then the procedure report has been included in a sealed envelope for you to review at your convenience later.  YOU SHOULD EXPECT: Some feelings of bloating in the abdomen. Passage of more gas than usual.  Walking can help get rid of the air that was put into your GI tract during the procedure and reduce the bloating.   Please Note:  You might notice some irritation and congestion in your nose or some drainage.  This is from the oxygen used during your procedure.  There is no need for concern and it should clear up in a day or so.  SYMPTOMS TO REPORT IMMEDIATELY:    Following upper endoscopy (EGD)  Vomiting of blood or coffee ground material  New chest pain or pain under the shoulder blades  Painful or persistently difficult swallowing  New shortness of breath  Fever of 100F or higher  Black, tarry-looking stools  For urgent or emergent issues, a gastroenterologist can be reached at any hour by calling [phone]. Do not use MyChart messaging for urgent concerns.    DIET:  We do recommend a small meal at first, but then you may proceed to your regular diet.  Drink plenty of fluids but you should avoid alcoholic beverages for 24 hours.  ACTIVITY:  You should plan to take it easy for the rest of today and you should NOT DRIVE or use heavy machinery until tomorrow (because of the sedation medicines used during the test).    FOLLOW UP: Our staff will call the number listed  on your records 48-72 hours following your procedure to check on you and address any questions or concerns that you may have regarding the information given to you following your procedure. If we do not reach you, we will leave a message.  We will attempt to reach you two times.  During this call, we will ask if you have developed any symptoms of COVID 19. If you develop any symptoms (ie: fever, flu-like symptoms, shortness of breath, cough etc.) before then, please call [phone].  If you test positive for Covid 19 in the 2 weeks post procedure, please call and report this information to us.    If any biopsies were taken you will be contacted by phone or by letter within the next 1-3 weeks.  Please call us at [phone] if you have not heard about the biopsies in 3 weeks.    SIGNATURES/CONFIDENTIALITY: You and/or your care partner have signed paperwork which will be entered into your electronic medical record.  These signatures attest to the fact that that the information above on your After Visit Summary has been reviewed and is understood.  Full responsibility of the confidentiality of this discharge information lies with you and/or your care-partner. 

## 2020-05-04 ENCOUNTER — Telehealth: Payer: Self-pay | Admitting: *Deleted

## 2020-05-04 NOTE — Telephone Encounter (Signed)
  Follow up Call-  Call back number 04/30/2020  Post procedure Call Back phone  # [phone]  Permission to leave phone message Yes  Some recent data might be hidden     First attempt for follow up phone call. No answer at number given.  Left message on voicemail.

## 2020-05-04 NOTE — Telephone Encounter (Signed)
  Follow up Call-  Call back number 04/30/2020  Post procedure Call Back phone  # [phone]  Permission to leave phone message Yes  Some recent data might be hidden     Patient questions:  Do you have a fever, pain , or abdominal swelling? No. Pain Score  0 *  Have you tolerated food without any problems? Yes.    Have you been able to return to your normal activities? Yes.    Do you have any questions about your discharge instructions: Diet   No. Medications  No. Follow up visit  No.  Do you have questions or concerns about your Care? No.  Actions: * If pain score is 4 or above: No action needed, pain <4.  1. Have you developed a fever since your procedure? no  2.   Have you had an respiratory symptoms (SOB or cough) since your procedure? no  3.   Have you tested positive for COVID 19 since your procedure no  4.   Have you had any family members/close contacts diagnosed with the COVID 19 since your procedure?  no   If yes to any of these questions please route to Laverna Peace, RN and Karlton Lemon, RN

## 2020-05-07 ENCOUNTER — Encounter: Payer: Self-pay | Admitting: Gastroenterology

## 2020-05-18 ENCOUNTER — Telehealth: Payer: Self-pay | Admitting: Family Medicine

## 2020-05-18 NOTE — Telephone Encounter (Signed)
Pt called to report she spoke with Dr Lucia Gaskins via My chart re: her headaches and was advised to schedule an appointment to be seen as soon as possible .  Pt accepted a change in her appointment date from January to 11-09.  Pt is aware to check in at 12:30 for the 1:00 appointment.  This is FYI no call back requested

## 2020-05-25 ENCOUNTER — Encounter: Payer: Self-pay | Admitting: Family Medicine

## 2020-05-25 ENCOUNTER — Ambulatory Visit (INDEPENDENT_AMBULATORY_CARE_PROVIDER_SITE_OTHER): Payer: Medicaid Other | Admitting: Family Medicine

## 2020-05-25 VITALS — BP 114/70 | HR 88 | Ht 68.0 in | Wt 279.0 lb

## 2020-05-25 DIAGNOSIS — R259 Unspecified abnormal involuntary movements: Secondary | ICD-10-CM

## 2020-05-25 DIAGNOSIS — F419 Anxiety disorder, unspecified: Secondary | ICD-10-CM

## 2020-05-25 DIAGNOSIS — F959 Tic disorder, unspecified: Secondary | ICD-10-CM

## 2020-05-25 NOTE — Progress Notes (Addendum)
Chief Complaint  Patient presents with  . Follow-up    RM 1  . Headache    pt still having head pressure     HISTORY OF PRESENT ILLNESS: Today 05/25/20  Berdie OgrenLorraine L Hodapp is a 39 y.o. female here today for follow up for chronic episodic headaches. 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 We started tizanidine. She reports that she never got this medication.   She reports that head pressure and vocalization concerns continue. She feels symptoms wax and wane. Head pressure feels that it tightens up and then releases when she yells out. She reports vocalization occurs with any type of pain. She shares a video with me int he office today where she was having head pain. She has random episodes where she will yell out or groan. She is seeing psychiatry. She was advised to take Paxil but has been scared to start it. She continues to have abdominal muscle spasms. She feels that she has trouble with short term memory loss. She has decreased motivation. She feels that she is gaining weight. She feels hungry but then feels that she doesn't have an appetite. She gets anxious about pain coming on if she exercises. She has chest pains when she is anxious. She has started B12 for low normal levels that she thought could be causing bilateral leg pain. EMG/NCS normal. She was diagnosed with Covid in 06/2019 and concerned these symptoms are related.    HISTORY (copied from my note on 01/21/2020)  Berdie OgrenLorraine L Mccann is a 39 y.o. female here today for follow up. She reports that twitching has improved significantly. She reports a "vocalization" when she has a headache or stomach pains. She describes a moan or groan that "comes out different with one pain and a different way with the other". She has not had any migrainous headaches. She describes a tension type pain. Pain is bilateral in the cheek and jaw area. She reports significant  jaw pain after work. 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 She has taken OTC allergy medications that have helped. She is seeing GI for stomach aches. She was started on Bentyl but not sure if it is helping. She has an appt with psychiatry this Saturday. She does not feel symptoms are related to anxiety.   HISTORY: (copied from Dr Lucia GaskinsAhern note on 10/21/2019)   ZOX:[id number]:Kitti L [name] a 38 y.o.femalehere as requested by Marcine MatarJohnson, Deborah B, MDfor tic disorder. PMHx anxiety, obesity. Reviewed Dr. Henriette CombsJohnson's notes: she developed body twitching in early January, the twitching comes "in waves" some days no twitching at all, she stopped drinking caffeine. She was started on zoloft. I reviewed ED notes: She presented to the ED 3/9 for persistent tics and anxiety disorder, tics controlled with cognitive behavioral therapy and breathing exercises and they thought she would benefit from behavioral therapy. She also presented 3/24 for migraine to the ER and then again on 3/29. She was in the ED 3/31 as well for migraine, facial pressure, chest pains, grunting and clearing her throat during exam which she describes as "fits". She then presented to the ER again 10/19/19 for facial pressure and pain, taking ketorolac and reglan, and also stated that many symptoms likely due to anxiety, she had maxillary tenderness and dxed with a sinus headache and told to use flonase, antihistamines and nasal rinses. She has also visited  the ED at [facility] as well on 3/30 with similar headache symptoms, diagnosed with acute tension-type headache and motor and vocal tics, exam was normal per notes. She has also been evaluated by cardiology for palpitations in the past per review of records as well.  She is here alone. We discussed due to limited time we will focus on migraines today, I will address tic disorder briefly but patient should really be seen in psychiatry for this disorder as they can provide cognitive  behavioral therapy or other therapy and can also treat with medication. She has a hx of migraines around menses for many years episodic but after covid started having significant headaches and pressure in the head and face, she had Covid in [place] and was hospitalized, she was home on Dec 22nd, the following week she was feeling better but still with significant fatigue. January 8th she went to the emergency room, she had chest pain, felt like she was dying, they thought she was having an anxiety atack she had multiple visits for this. In February headaches worsened, has progressively gotten worse and she went to the ED many times for headache as well. She feels like electric shocks in the head, pressure in the head severe, worst headaches of life and "brain zaps". Sensory changes left side of the face, light and sound sensitivity. Working made it worse. Headaches/migraines can last days, going into a dark room helps, pulsating/pounding/throbbing, pressure in the face and jaw, positional worse with bending over, blurry vision. She is having the headaches every day. Shooting pain through the nose. Headaches are throbbing and all over, can be unilateral in the temples also in the back of the head. Never been on migraine medications in the past. She says her tics are in arms and shoulders, feels like it does it on its own, jerking movements, she makes vocalizations.  Reviewed notes, labs and imaging from outside physicians, which showed: see above, cbc and cmp unremarkable 10/13/2019    REVIEW OF SYSTEMS: Out of a complete 14 system review of symptoms, the patient complains only of the following symptoms, intermittent head pressure, vocalizations, anxiety, chest pain, palpitations, decreased appetite, weight gain, memory loss, loss of motivation and all other reviewed systems are negative.   ALLERGIES: Allergies  Allergen Reactions  . Zoloft [Sertraline]     Pt states it made her crazy/worsened anxiety.       HOME MEDICATIONS: Outpatient Medications Prior to Visit  Medication Sig Dispense Refill  . fluticasone (FLONASE) 50 MCG/ACT nasal spray Place 2 sprays into both nostrils daily. 9.9 mL 2  . hydrOXYzine (ATARAX/VISTARIL) 10 MG tablet Take 10 mg by mouth 3 (three) times daily as needed. Takes daily    . dicyclomine (BENTYL) 10 MG capsule TAKE 1 CAPSULE (10 MG TOTAL) BY MOUTH IN THE MORNING, AT NOON, IN THE EVENING, AND AT BEDTIME. 360 capsule 1  . Melatonin 10 MG TABS Take 10 mg by mouth at bedtime as needed (sleep). 30 tablet 2  . omeprazole (PRILOSEC) 40 MG capsule Take 1 capsule (40 mg total) by mouth in the morning and at bedtime. 60 capsule 2  . ondansetron (ZOFRAN) 4 MG tablet Take 1 tablet (4 mg total) by mouth daily as needed for nausea or vomiting. 30 tablet 1   No facility-administered medications prior to visit.     PAST MEDICAL HISTORY: Past Medical History:  Diagnosis Date  . Anxiety   . Asthma   . Chronic headaches   . COVID-19   .  Gallstones   . Gastritis   . GERD (gastroesophageal reflux disease)   . Hyperlipemia    no meds  . Kidney stones      PAST SURGICAL HISTORY: Past Surgical History:  Procedure Laterality Date  . CHOLECYSTECTOMY  feb 2006  . COLONOSCOPY    . KNEE SURGERY Left      FAMILY HISTORY: Family History  Problem Relation Age of Onset  . Diabetes Father   . Heart failure Father   . Heart attack Father   . Prostate cancer Maternal Grandfather   . Pancreatic cancer Maternal Grandfather   . Colon cancer Maternal Grandfather   . Breast cancer Maternal Aunt   . Ovarian cancer Mother   . Diabetes Maternal Grandmother   . Kidney Stones Maternal Grandmother   . Heart attack Paternal Grandfather   . Heart attack Paternal Aunt   . Diabetes Maternal Uncle   . Asthma Son   . Tics Neg Hx   . Migraines Neg Hx   . Esophageal cancer Neg Hx   . Rectal cancer Neg Hx   . Stomach cancer Neg Hx      SOCIAL HISTORY: Social History    Socioeconomic History  . Marital status: Single    Spouse name: Not on file  . Number of children: 1  . Years of education: Not on file  . Highest education level: Associate degree: occupational, Scientist, product/process development, or vocational program  Occupational History  . Occupation: LPN  Tobacco Use  . Smoking status: Never Smoker  . Smokeless tobacco: Never Used  Vaping Use  . Vaping Use: Former  Substance and Sexual Activity  . Alcohol use: Yes    Comment: occasionally-social  . Drug use: Not Currently    Types: Marijuana    Comment: tried marijuana once in an edible but it made her freak out.   Marland Kitchen Sexual activity: Yes    Birth control/protection: I.U.D.  Other Topics Concern  . 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   Right handed   Caffeine: none    Social Determinants of Health   Financial Resource Strain:   . Difficulty of Paying Living Expenses: Not on file  Food Insecurity:   . Worried About Programme researcher, broadcasting/film/video in the Last Year: Not on file  . Ran Out of Food in the Last Year: Not on file  Transportation Needs:   . Lack of Transportation (Medical): Not on file  . Lack of Transportation (Non-Medical): Not on file  Physical Activity:   . Days of Exercise per Week: Not on file  . Minutes of Exercise per Session: Not on file  Stress:   . Feeling of Stress : Not on file  Social Connections:   . Frequency of Communication with Friends and Family: Not on file  . Frequency of Social Gatherings with Friends and Family: Not on file  . Attends Religious Services: Not on file  . Active Member of Clubs or Organizations: Not on file  . Attends Banker Meetings: Not on file  . Marital Status: Not on file  Intimate Partner Violence:   . Fear of Current or Ex-Partner: Not on file  . Emotionally Abused: Not on file  . Physically Abused: Not on file  . Sexually Abused: Not on file      PHYSICAL  EXAM  Vitals:   05/25/20 1241  BP: 114/70  Pulse: 88  Weight: 279 lb (126.6 kg)  Height:  (1.727 m)   Body mass index is 42.42 kg/m.   Generalized: Well developed, in no acute distress   Cardiology: Normal rate and rhythm, no murmur auscultated Respiratory: Clear to auscultation bilaterally  Neurological examination  Mentation: Alert oriented to time, place, history taking. Follows all commands speech and language fluent Cranial nerve II-XII: Pupils were equal round reactive to light. Extraocular movements were full, visual field were full  Motor: The motor testing reveals 5 over 5 strength of all 4 extremities. Good symmetric motor tone is noted throughout.  Gait and station: Gait is normal.     DIAGNOSTIC DATA (LABS, IMAGING, TESTING) - I reviewed patient records, labs, notes, testing and imaging myself where available.  Lab Results  Component Value Date   WBC 5.7 02/03/2020   HGB 13.0 02/03/2020   HCT 40.0 02/03/2020   MCV 83.9 02/03/2020   PLT 299 02/03/2020      Component Value Date/Time   NA 134 (L) 02/03/2020 1608   NA 142 09/18/2019 1605   K 3.7 02/03/2020 1608   CL 103 02/03/2020 1608   CO2 24 02/03/2020 1608   GLUCOSE 106 (H) 02/03/2020 1608   BUN 9 02/03/2020 1608   BUN 7 09/18/2019 1605   CREATININE 1.08 (H) 02/03/2020 1608   CREATININE 0.84 02/07/2016 1719   CALCIUM 9.1 02/03/2020 1608   PROT 8.3 (H) 02/03/2020 1608   ALBUMIN 4.1 02/03/2020 1608   AST 21 02/03/2020 1608   ALT 29 02/03/2020 1608   ALKPHOS 66 02/03/2020 1608   BILITOT 0.7 02/03/2020 1608   GFRNONAA >60 02/03/2020 1608   GFRAA >60 02/03/2020 1608   Lab Results  Component Value Date   CHOL 169 02/07/2016   HDL 40 (L) 02/07/2016   LDLCALC 96 02/07/2016   TRIG 51 07/03/2019   CHOLHDL 4.2 02/07/2016   Lab Results  Component Value Date   HGBA1C 5.4 02/12/2020   Lab Results  Component Value Date   VITAMINB12 339 02/12/2020   Lab Results  Component Value Date   TSH  0.912 02/12/2020      ASSESSMENT AND PLAN  39 y.o. year old female  has a past medical history of Anxiety, Asthma, Chronic headaches, COVID-19, Gallstones, Gastritis, GERD (gastroesophageal reflux disease), Hyperlipemia, and Kidney stones. here with   Tic disorder  Abnormal involuntary movements  Anxiety  Malaiyah presents today with continued concerns of intermittent head pressure associated with vocalizations.  She reports that head will start hurting in random locations and once she yells out, pain resolves.  Symptoms seem to wax and wane.  I have watched a video she presents in the office where she was experiencing similar event.  Patient has vocalizations in the office as she discusses symptoms but not currently in pain.  She also reports increased anxiety with her trip from [place] to [place] for today's appointment.  Neurological examination has been unremarkable.  I feel that symptoms are most likely related to anxiety.  She is now seeing psychiatry who has recommended she start SSRI.  I have educated her on possible benefits of using paroxetine.  I have also provided additional information in her AVS for her to review.  I recommend that she consider starting paroxetine as directed by psychiatry.  I will also share notes with Dr. Daisy Blossom in consideration of any additional work-up, however, no additional work-up recommended at this time.  Healthy lifestyle habits encouraged.  She will follow-up as needed.  She  verbalizes understanding and agreement with this plan.  I spent 30 minutes of face-to-face and non-face-to-face time with patient.  This included previsit chart review, lab review, study review, order entry, electronic health record documentation, patient education.    Shawnie Dapper, MSN, FNP-C 05/25/2020, 1:33 PM  [facility] [address] [place], [place] [address] [phone]  Made any corrections needed, and agree with history, physical, neuro  exam,assessment and plan as stated.     Naomie Dean, MD [facility]  Made any corrections needed, and agree with history, physical, neuro exam,assessment and plan as stated.     Naomie Dean, MD [facility]

## 2020-05-25 NOTE — Patient Instructions (Signed)
Below is our plan:  I recommend that you consider starting Paxil as prescribed by PCP. I have given you information to review below.   Please make sure you are staying well hydrated. I recommend 50-60 ounces daily. Well balanced diet and regular exercise encouraged.   Please continue follow up with care team as directed.   Follow up for worsening symptoms.   You may receive a survey regarding today's visit. I encourage you to leave honest feed back as I do use this information to improve patient care. Thank you for seeing me today!    Paroxetine tablets What is this medicine? PAROXETINE (pa ROX e teen) is used to treat depression. It may also be used to treat anxiety disorders, obsessive compulsive disorder, panic attacks, post traumatic stress, and premenstrual dysphoric disorder (PMDD). This medicine may be used for other purposes; ask your health care provider or pharmacist if you have questions. COMMON BRAND NAME(S): Paxil, Pexeva What should I tell my health care provider before I take this medicine? They need to know if you have any of these conditions:  bipolar disorder or a family history of bipolar disorder  bleeding disorders  glaucoma  heart disease  kidney disease  liver disease  low levels of sodium in the blood  seizures  suicidal thoughts, plans, or attempt; a previous suicide attempt by you or a family member  take MAOIs like Carbex, Eldepryl, Marplan, Nardil, and Parnate  take medicines that treat or prevent blood clots  thyroid disease  an unusual or allergic reaction to paroxetine, other medicines, foods, dyes, or preservatives  pregnant or trying to get pregnant  breast-feeding How should I use this medicine? Take this medicine by mouth with a glass of water. Follow the directions on the prescription label. You can take it with or without food. Take your medicine at regular intervals. Do not take your medicine more often than directed. Do not  stop taking this medicine suddenly except upon the advice of your doctor. Stopping this medicine too quickly may cause serious side effects or your condition may worsen. A special MedGuide will be given to you by the pharmacist with each prescription and refill. Be sure to read this information carefully each time. Talk to your pediatrician regarding the use of this medicine in children. Special care may be needed. Overdosage: If you think you have taken too much of this medicine contact a poison control center or emergency room at once. NOTE: This medicine is only for you. Do not share this medicine with others. What if I miss a dose? If you miss a dose, take it as soon as you can. If it is almost time for your next dose, take only that dose. Do not take double or extra doses. What may interact with this medicine? Do not take this medicine with any of the following medications:  linezolid  MAOIs like Carbex, Eldepryl, Marplan, Nardil, and Parnate  methylene blue (injected into a vein)  pimozide  thioridazine This medicine may also interact with the following medications:  alcohol  amphetamines  aspirin and aspirin-like medicines  atomoxetine  certain medicines for depression, anxiety, or psychotic disturbances  certain medicines for irregular heart beat like propafenone, flecainide, encainide, and quinidine  certain medicines for migraine headache like almotriptan, eletriptan, frovatriptan, naratriptan, rizatriptan, sumatriptan, zolmitriptan  cimetidine  digoxin  diuretics  fentanyl  fosamprenavir  furazolidone  isoniazid  lithium  medicines that treat or prevent blood clots like warfarin, enoxaparin, and dalteparin  medicines  for sleep  NSAIDs, medicines for pain and inflammation, like ibuprofen or naproxen  phenobarbital  phenytoin  procarbazine  rasagiline  ritonavir  supplements like St. John's wort, kava kava,  valerian  tamoxifen  tramadol  tryptophan This list may not describe all possible interactions. Give your health care provider a list of all the medicines, herbs, non-prescription drugs, or dietary supplements you use. Also tell them if you smoke, drink alcohol, or use illegal drugs. Some items may interact with your medicine. What should I watch for while using this medicine? Tell your doctor if your symptoms do not get better or if they get worse. Visit your doctor or health care professional for regular checks on your progress. Because it may take several weeks to see the full effects of this medicine, it is important to continue your treatment as prescribed by your doctor. Patients and their families should watch out for new or worsening thoughts of suicide or depression. Also watch out for sudden changes in feelings such as feeling anxious, agitated, panicky, irritable, hostile, aggressive, impulsive, severely restless, overly excited and hyperactive, or not being able to sleep. If this happens, especially at the beginning of treatment or after a change in dose, call your health care professional. Bonita Quin may get drowsy or dizzy. Do not drive, use machinery, or do anything that needs mental alertness until you know how this medicine affects you. Do not stand or sit up quickly, especially if you are an older patient. This reduces the risk of dizzy or fainting spells. Alcohol may interfere with the effect of this medicine. Avoid alcoholic drinks. Your mouth may get dry. Chewing sugarless gum or sucking hard candy, and drinking plenty of water will help. Contact your doctor if the problem does not go away or is severe. What side effects may I notice from receiving this medicine? Side effects that you should report to your doctor or health care professional as soon as possible:  allergic reactions like skin rash, itching or hives, swelling of the face, lips, or tongue  anxious  black, tarry  stools  changes in vision  confusion  elevated mood, decreased need for sleep, racing thoughts, impulsive behavior  eye pain  fast, irregular heartbeat  feeling faint or lightheaded, falls  feeling agitated, angry, or irritable  hallucination, loss of contact with reality  loss of balance or coordination  loss of memory  painful or prolonged erections  restlessness, pacing, inability to keep still  seizures  stiff muscles  suicidal thoughts or other mood changes  trouble sleeping  unusual bleeding or bruising  unusually weak or tired  vomiting Side effects that usually do not require medical attention (report to your doctor or health care professional if they continue or are bothersome):  change in appetite or weight  change in sex drive or performance  diarrhea  dizziness  dry mouth  increased sweating  indigestion, nausea  tired  tremors This list may not describe all possible side effects. Call your doctor for medical advice about side effects. You may report side effects to FDA at 1-800-FDA-1088. Where should I keep my medicine? Keep out of the reach of children. Store at room temperature between 15 and 30 degrees C (59 and 86 degrees F). Keep container tightly closed. Throw away any unused medicine after the expiration date. NOTE: This sheet is a summary. It may not cover all possible information. If you have questions about this medicine, talk to your doctor, pharmacist, or health care provider.  2020 Elsevier/Gold Standard (2015-12-04 15:50:32)   Generalized Anxiety Disorder, Adult Generalized anxiety disorder (GAD) is a mental health disorder. People with this condition constantly worry about everyday events. Unlike normal anxiety, worry related to GAD is not triggered by a specific event. These worries also do not fade or get better with time. GAD interferes with life functions, including relationships, work, and school. GAD can vary from  mild to severe. People with severe GAD can have intense waves of anxiety with physical symptoms (panic attacks). What are the causes? The exact cause of GAD is not known. What increases the risk? This condition is more likely to develop in:  Women.  People who have a family history of anxiety disorders.  People who are very shy.  People who experience very stressful life events, such as the death of a loved one.  People who have a very stressful family environment. What are the signs or symptoms? People with GAD often worry excessively about many things in their lives, such as their health and family. They may also be overly concerned about:  Doing well at work.  Being on time.  Natural disasters.  Friendships. Physical symptoms of GAD include:  Fatigue.  Muscle tension or having muscle twitches.  Trembling or feeling shaky.  Being easily startled.  Feeling like your heart is pounding or racing.  Feeling out of breath or like you cannot take a deep breath.  Having trouble falling asleep or staying asleep.  Sweating.  Nausea, diarrhea, or irritable bowel syndrome (IBS).  Headaches.  Trouble concentrating or remembering facts.  Restlessness.  Irritability. How is this diagnosed? Your health care provider can diagnose GAD based on your symptoms and medical history. You will also have a physical exam. The health care provider will ask specific questions about your symptoms, including how severe they are, when they started, and if they come and go. Your health care provider may ask you about your use of alcohol or drugs, including prescription medicines. Your health care provider may refer you to a mental health specialist for further evaluation. Your health care provider will do a thorough examination and may perform additional tests to rule out other possible causes of your symptoms. To be diagnosed with GAD, a person must have anxiety that:  Is out of his or her  control.  Affects several different aspects of his or her life, such as work and relationships.  Causes distress that makes him or her unable to take part in normal activities.  Includes at least three physical symptoms of GAD, such as restlessness, fatigue, trouble concentrating, irritability, muscle tension, or sleep problems. Before your health care provider can confirm a diagnosis of GAD, these symptoms must be present more days than they are not, and they must last for six months or longer. How is this treated? The following therapies are usually used to treat GAD:  Medicine. Antidepressant medicine is usually prescribed for long-term daily control. Antianxiety medicines may be added in severe cases, especially when panic attacks occur.  Talk therapy (psychotherapy). Certain types of talk therapy can be helpful in treating GAD by providing support, education, and guidance. Options include: ? Cognitive behavioral therapy (CBT). People learn coping skills and techniques to ease their anxiety. They learn to identify unrealistic or negative thoughts and behaviors and to replace them with positive ones. ? Acceptance and commitment therapy (ACT). This treatment teaches people how to be mindful as a way to cope with unwanted thoughts and feelings. ? Biofeedback. This  process trains you to manage your body's response (physiological response) through breathing techniques and relaxation methods. You will work with a therapist while machines are used to monitor your physical symptoms.  Stress management techniques. These include yoga, meditation, and exercise. A mental health specialist can help determine which treatment is best for you. Some people see improvement with one type of therapy. However, other people require a combination of therapies. Follow these instructions at home:  Take over-the-counter and prescription medicines only as told by your health care provider.  Try to maintain a normal  routine.  Try to anticipate stressful situations and allow extra time to manage them.  Practice any stress management or self-calming techniques as taught by your health care provider.  Do not punish yourself for setbacks or for not making progress.  Try to recognize your accomplishments, even if they are small.  Keep all follow-up visits as told by your health care provider. This is important. Contact a health care provider if:  Your symptoms do not get better.  Your symptoms get worse.  You have signs of depression, such as: ? A persistently sad, cranky, or irritable mood. ? Loss of enjoyment in activities that used to bring you joy. ? Change in weight or eating. ? Changes in sleeping habits. ? Avoiding friends or family members. ? Loss of energy for normal tasks. ? Feelings of guilt or worthlessness. Get help right away if:  You have serious thoughts about hurting yourself or others. If you ever feel like you may hurt yourself or others, or have thoughts about taking your own life, get help right away. You can go to your nearest emergency department or call:  Your local emergency services (911 in the [place]  A suicide crisis helpline, such as the National Suicide Prevention Lifeline at 747 531 31861-440-260-4857. This is open 24 hours a day. Summary  Generalized anxiety disorder (GAD) is a mental health disorder that involves worry that is not triggered by a specific event.  People with GAD often worry excessively about many things in their lives, such as their health and family.  GAD may cause physical symptoms such as restlessness, trouble concentrating, sleep problems, frequent sweating, nausea, diarrhea, headaches, and trembling or muscle twitching.  A mental health specialist can help determine which treatment is best for you. Some people see improvement with one type of therapy. However, other people require a combination of therapies. This information is not intended to  replace advice given to you by your health care provider. Make sure you discuss any questions you have with your health care provider. Document Revised: 06/15/2017 Document Reviewed: 05/23/2016 Elsevier Patient Education  2020 [employer]

## 2020-06-15 ENCOUNTER — Telehealth (HOSPITAL_COMMUNITY): Payer: BLUE CROSS/BLUE SHIELD | Admitting: Psychiatry

## 2020-06-15 ENCOUNTER — Other Ambulatory Visit: Payer: Self-pay

## 2020-07-09 ENCOUNTER — Encounter: Payer: Self-pay | Admitting: Internal Medicine

## 2020-07-20 ENCOUNTER — Ambulatory Visit (INDEPENDENT_AMBULATORY_CARE_PROVIDER_SITE_OTHER): Payer: Medicaid Other | Admitting: Family Medicine

## 2020-07-20 ENCOUNTER — Other Ambulatory Visit: Payer: Self-pay

## 2020-07-20 ENCOUNTER — Encounter (INDEPENDENT_AMBULATORY_CARE_PROVIDER_SITE_OTHER): Payer: Self-pay

## 2020-07-20 DIAGNOSIS — Z0289 Encounter for other administrative examinations: Secondary | ICD-10-CM

## 2020-07-26 ENCOUNTER — Ambulatory Visit: Payer: BLUE CROSS/BLUE SHIELD | Admitting: Family Medicine

## 2020-08-03 ENCOUNTER — Ambulatory Visit (INDEPENDENT_AMBULATORY_CARE_PROVIDER_SITE_OTHER): Payer: Medicaid Other | Admitting: Family Medicine

## 2020-08-14 ENCOUNTER — Encounter: Payer: Self-pay | Admitting: Internal Medicine

## 2020-08-14 DIAGNOSIS — S42402A Unspecified fracture of lower end of left humerus, initial encounter for closed fracture: Secondary | ICD-10-CM

## 2020-08-15 ENCOUNTER — Other Ambulatory Visit: Payer: Self-pay | Admitting: Internal Medicine

## 2020-08-15 MED ORDER — OXYCODONE-ACETAMINOPHEN 10-325 MG PO TABS: 1.0000 | ORAL_TABLET | Freq: Three times a day (TID) | ORAL | 0 refills | Status: DC | PRN

## 2020-08-18 ENCOUNTER — Telehealth: Payer: BLUE CROSS/BLUE SHIELD | Admitting: Physician Assistant

## 2020-08-19 ENCOUNTER — Telehealth: Payer: Self-pay | Admitting: Internal Medicine

## 2020-08-19 NOTE — Telephone Encounter (Signed)
-----   Message from Dionne Bucy sent at 08/19/2020 10:16 AM EST ----- Regarding: RE: Ortho Referral Per Patient request   Sent Referral to Dr   Boone Master   08/18/2020 [address] [place], [place] [address] [phone]) ----- Message ----- From: Marcine Matar, MD Sent: 08/18/2020   9:08 AM EST To: Dionne Bucy Subject: Ortho Referral                                 Pt fractured her LT elbow several days ago.  Needs appt with ortho ASAP.

## 2020-08-20 ENCOUNTER — Telehealth: Payer: Self-pay | Admitting: Internal Medicine

## 2020-08-20 MED ORDER — OXYCODONE-ACETAMINOPHEN 10-325 MG PO TABS
1.0000 | ORAL_TABLET | Freq: Three times a day (TID) | ORAL | 0 refills | Status: DC | PRN
Start: 2020-08-20 — End: 2022-10-18

## 2020-08-20 NOTE — Telephone Encounter (Signed)
Pt returned called. Pt states she called the orthopedic office and per the pt she was told she has to get approval from [employer]. Pt states she was also told that she has to get her xrays on a disk to take to her appt.  Pt states she has an appt on 2/10 but she has to make sure she has all the information.Amy Mendoza

## 2020-08-20 NOTE — Telephone Encounter (Signed)
Copied from CRM [id number]. Topic: Quick Communication - Rx Refill/Question >> Aug 19, 2020 10:49 AM Lyn Hollingshead D wrote: PT requesting another refill / oxyCODONE-acetaminophen (PERCOCET) 10-325 MG tablet [[id number]] / please advise

## 2020-08-20 NOTE — Telephone Encounter (Signed)
Per Dr. Laural Benes she gave pt a limited supply.    Pt is suppose to be following up with ortho.

## 2020-09-06 DIAGNOSIS — S42402D Unspecified fracture of lower end of left humerus, subsequent encounter for fracture with routine healing: Secondary | ICD-10-CM | POA: Insufficient documentation

## 2020-09-09 ENCOUNTER — Ambulatory Visit: Payer: BLUE CROSS/BLUE SHIELD | Admitting: Physician Assistant

## 2020-09-09 ENCOUNTER — Other Ambulatory Visit: Payer: Self-pay

## 2020-09-15 ENCOUNTER — Telehealth: Payer: Self-pay

## 2020-09-15 NOTE — Telephone Encounter (Signed)
Transition Care Management Follow-up Telephone Call  Date of discharge and from where: 09/14/2020 from [facility]  How have you been since you were released from the hospital? Pt stated that she feeling better since her surgery. Pt had some concerns about her medications and the follow appt for Friday. I encouraged patient to reach out to her ortho surgeon for recommendations about medications and about the appt on Friday.   Any questions or concerns? No  Items Reviewed:  Did the pt receive and understand the discharge instructions provided? Yes   Medications obtained and verified? Yes   Other? No   Any new allergies since your discharge? No   Dietary orders reviewed? n/a  Do you have support at home? Yes   Functional Questionnaire: (I = Independent and D = Dependent) ADLs: I  Bathing/Dressing- I  Meal Prep- I  Eating- I  Maintaining continence- I  Transferring/Ambulation- I  Managing Meds- I   Follow up appointments reviewed:   PCP Hospital f/u appt confirmed? No    Specialist Hospital f/u appt confirmed? Yes  Scheduled to see Tiburcio Pea, OPA-C on 09/17/2020 @ 09:45am.  Are transportation arrangements needed? No   If their condition worsens, is the pt aware to call PCP or go to the Emergency Dept.? Yes  Was the patient provided with contact information for the PCP's office or ED? Yes  Was to pt encouraged to call back with questions or concerns? Yes

## 2020-11-15 IMAGING — CR DG ABDOMEN ACUTE W/ 1V CHEST
4 series · 4 of 4 positions shown · non-contrast
Comparison: None.

CLINICAL DATA: Abdominal pain and chest pain.

EXAM:
DG ABDOMEN ACUTE W/ 1V CHEST

[w chest pa]
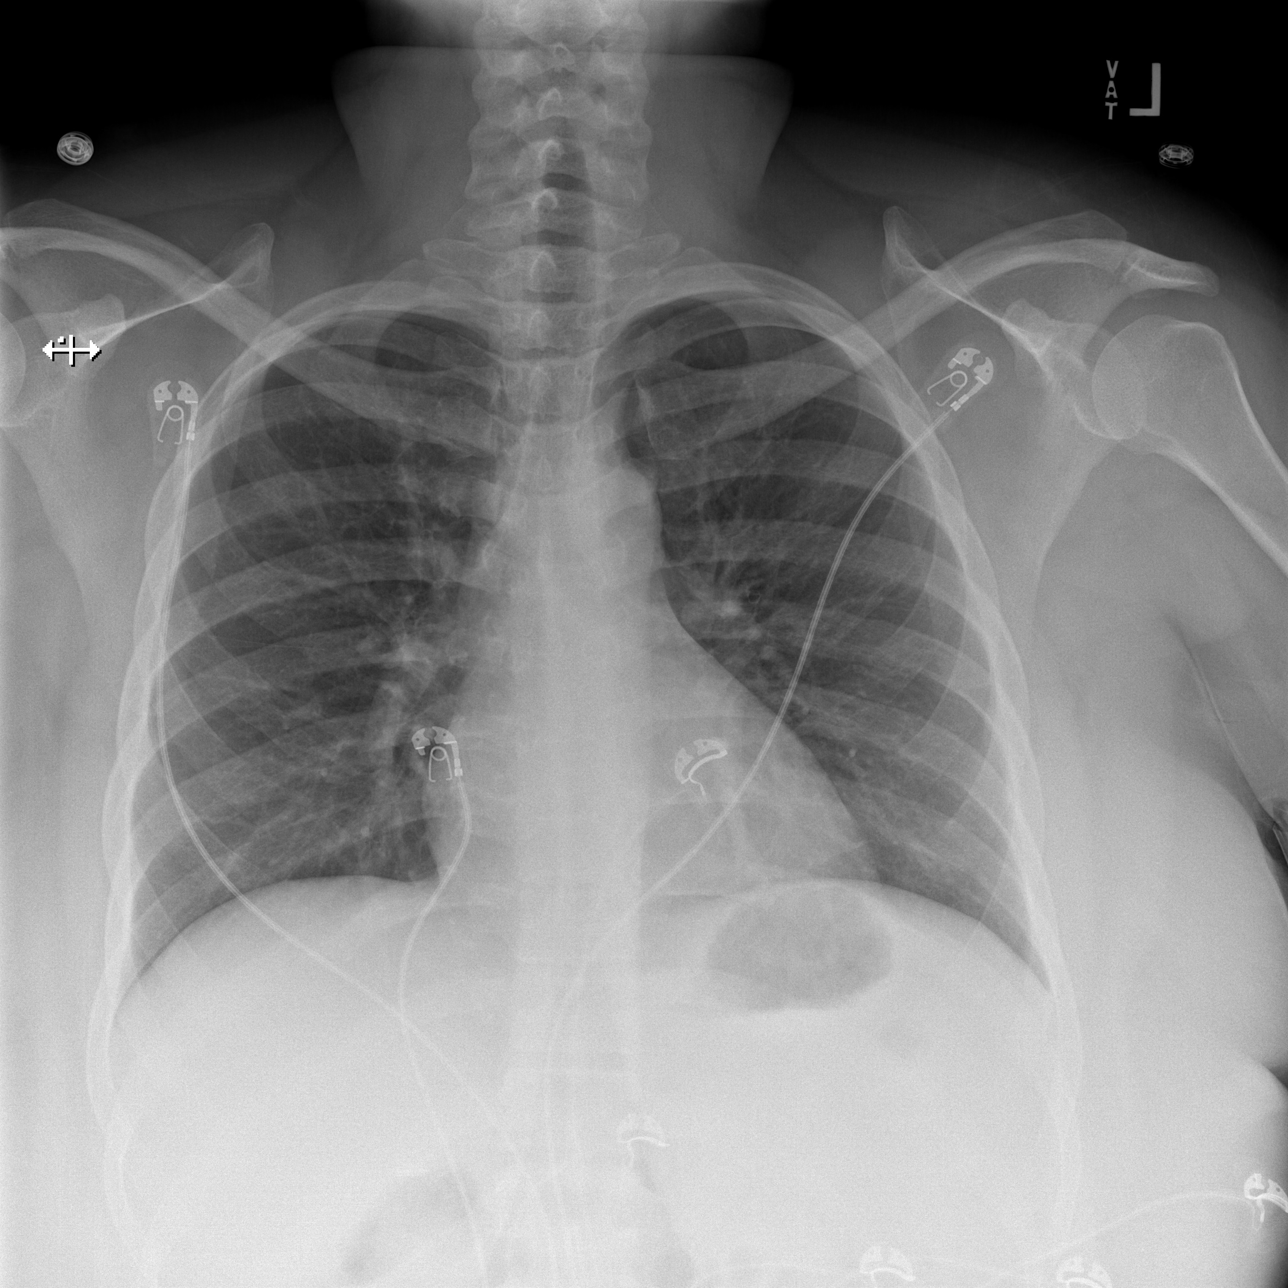

[w abdomen upright]
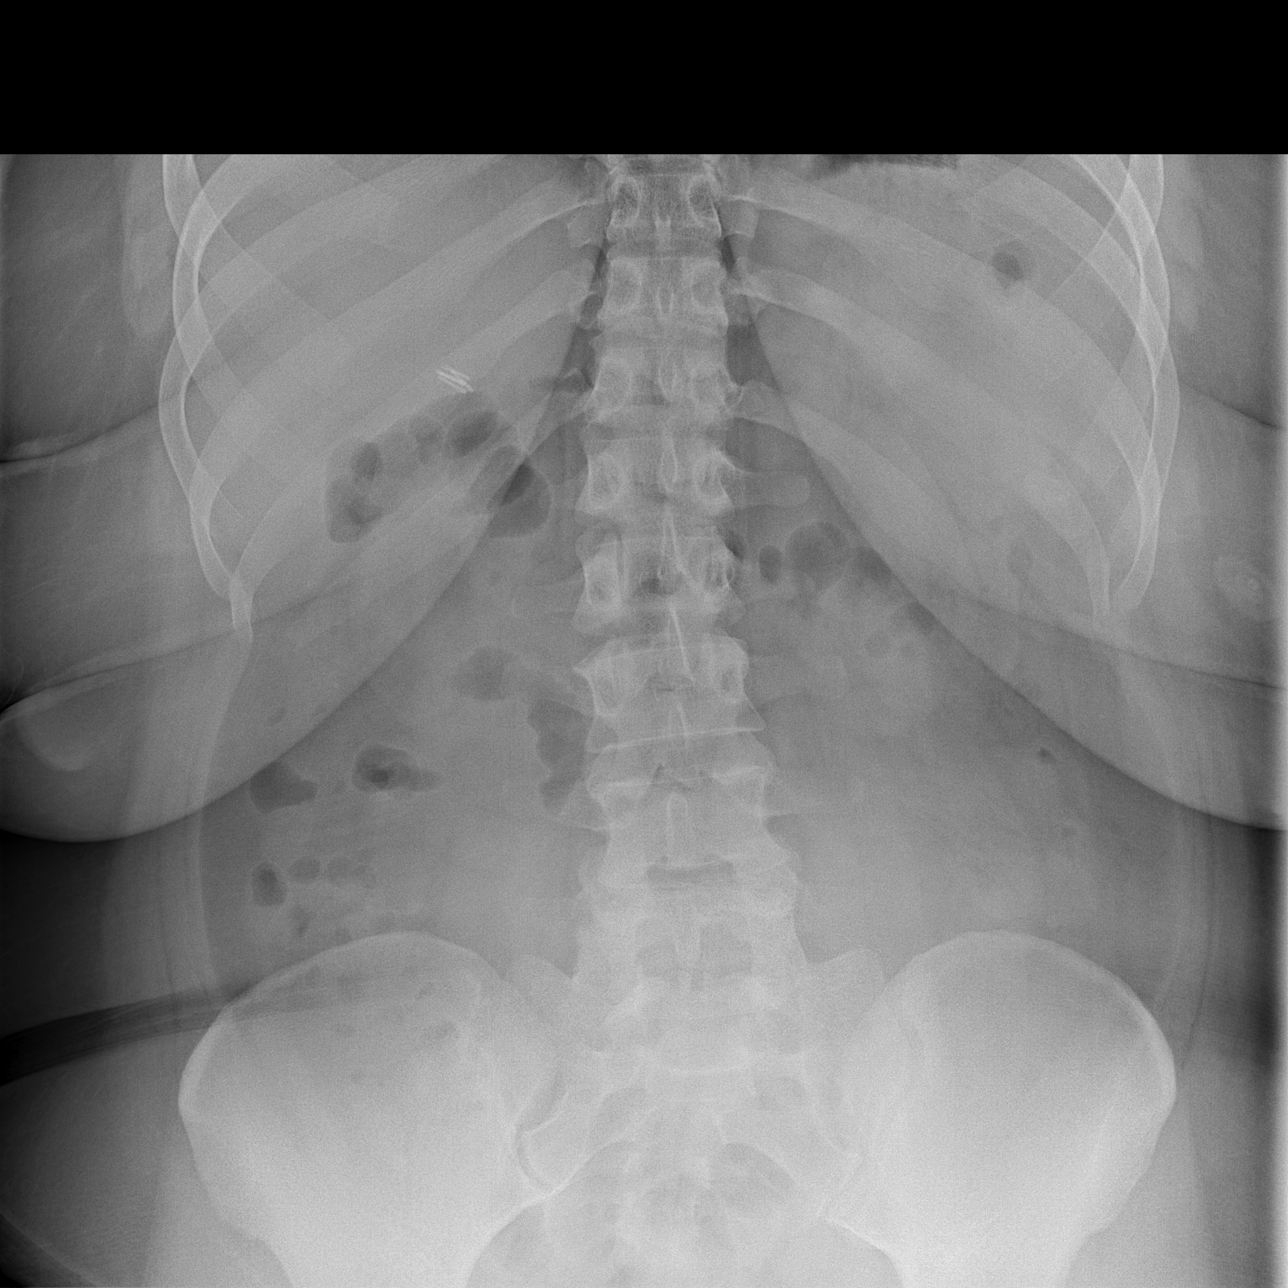

[t abdomen supine (1 of 2)]
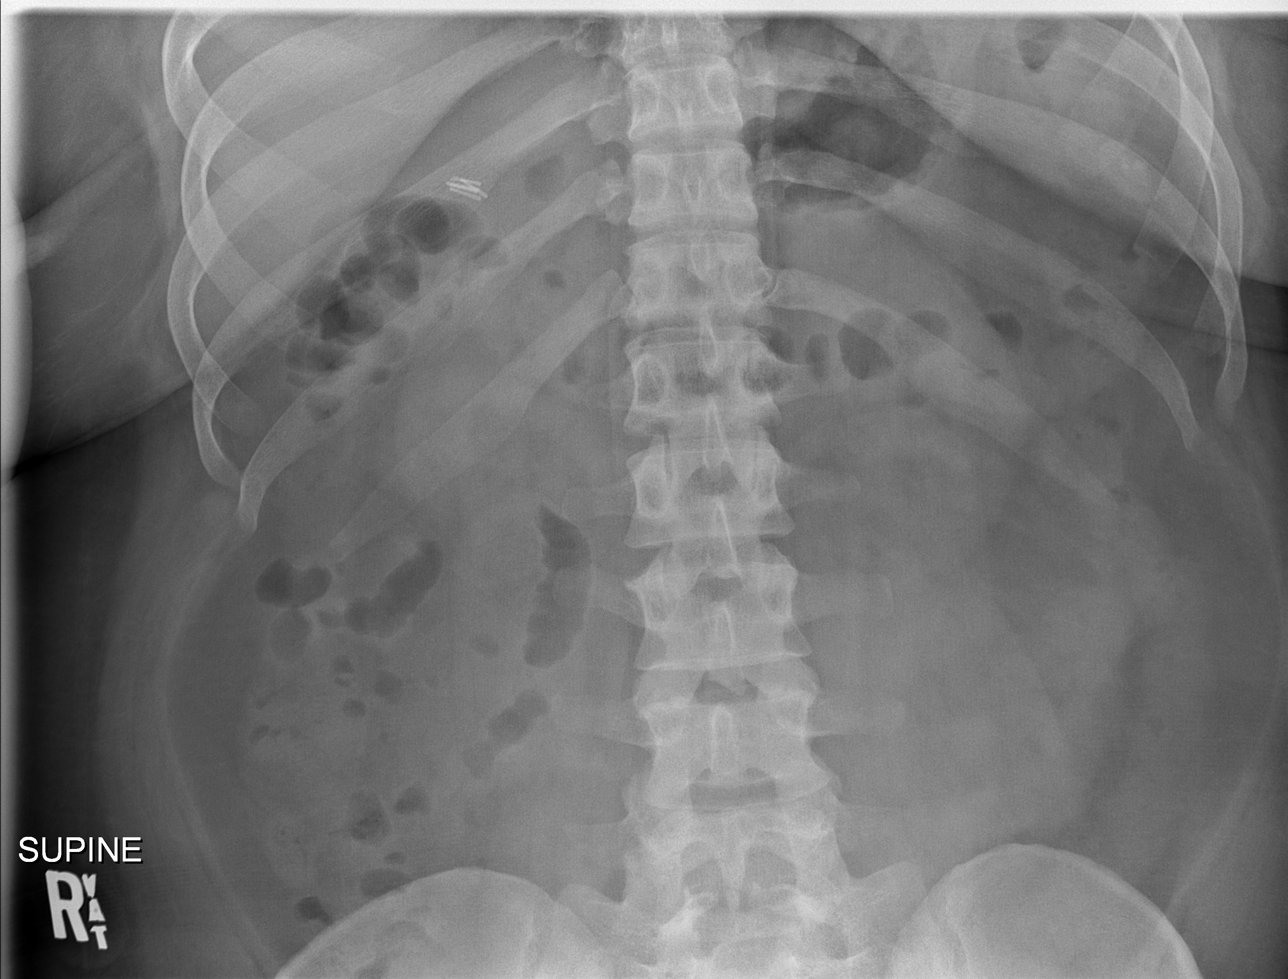

[t abdomen supine (2 of 2)]
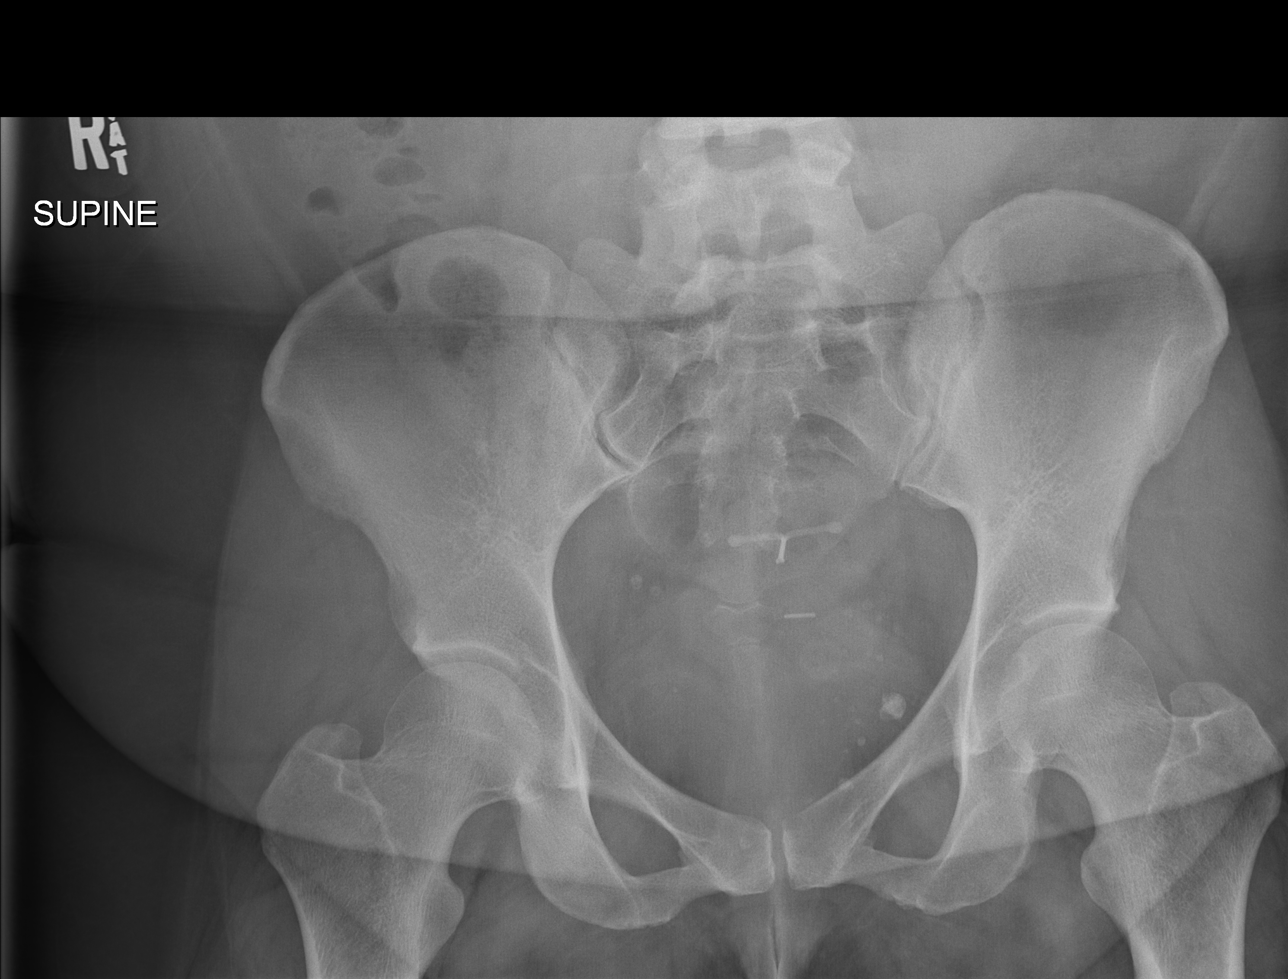

[4 of 4 positions shown; findings below may reference images not displayed]

FINDINGS: There is no evidence of dilated bowel loops or free intraperitoneal
air. No radiopaque calculi or other significant radiographic
abnormality is seen. Radiopaque surgical clips are seen overlying
the right upper quadrant. An additional surgical clip is seen
overlying the lower pelvis, to the left of midline. Numerous
subcentimeter phleboliths are also seen within the lower left
hemipelvis. An IUD is in place. Heart size and mediastinal contours
are within normal limits. Both lungs are clear.
IMPRESSION: Negative abdominal radiographs.  No acute cardiopulmonary disease.

## 2020-11-18 ENCOUNTER — Telehealth: Payer: Self-pay

## 2020-11-18 NOTE — Telephone Encounter (Signed)
Copied from CRM [id number]. Topic: General - Other >> Nov 17, 2020 12:37 PM Wyonia Hough E wrote: Reason for CRM: Pt called and scheduled an appt with Dr. Laural Benes but wanted Dr. Laural Benes or nurse to give her a call/ she stated she has gained a lot of weight and has broken her arm since last seeing pcp so she hasnt been able to work out as much / she also mentioned a refill for anxiety medication please advise

## 2020-11-18 NOTE — Telephone Encounter (Signed)
Returned pt call pt states she is wanting a sooner appt with provider. Pt has been scheudle for 6/16 at 210

## 2020-12-21 ENCOUNTER — Encounter: Payer: Self-pay | Admitting: Internal Medicine

## 2020-12-21 MED ORDER — HYDROXYZINE HCL 10 MG PO TABS: 10.0000 mg | ORAL_TABLET | Freq: Three times a day (TID) | ORAL | 0 refills | Status: DC | PRN

## 2020-12-30 ENCOUNTER — Ambulatory Visit: Payer: BLUE CROSS/BLUE SHIELD | Attending: Internal Medicine | Admitting: Internal Medicine

## 2020-12-30 ENCOUNTER — Other Ambulatory Visit: Payer: Self-pay

## 2020-12-30 ENCOUNTER — Encounter: Payer: Self-pay | Admitting: Internal Medicine

## 2020-12-30 VITALS — BP 114/80 | HR 106 | Resp 16 | Wt 290.6 lb

## 2020-12-30 DIAGNOSIS — Z6841 Body Mass Index (BMI) 40.0 and over, adult: Secondary | ICD-10-CM

## 2020-12-30 DIAGNOSIS — E781 Pure hyperglyceridemia: Secondary | ICD-10-CM

## 2020-12-30 NOTE — Progress Notes (Addendum)
Patient ID: Amy Mendoza, female    DOB: 04-Jan-1981  MRN: [id number]  CC: Weight Loss   Subjective: Amy Mendoza is a 40 y.o. female who presents for chronic ds management Her concerns today include:  Patient with history of COVID-19 infection 06/2019, asthma, HL, tension HA, ? TMJ,  anxiety, obesity, GERD  Main concern is with her wgh.  She desires to achieve weight loss.  States that she is struggled with this for the past several years.  Weight has increased since being at [facility] since January of this year.  Tells me that she has tried various things including phentermine, herbal life, B12/hCG injections and trying to change her diet.  At most with these measures she was able to lose 20 to 25 pounds.  Used to be seen at the bariatric center here in [place] for about 6 months in 2018 where she was receiving the phentermine, B12 and hCG injections.  At that time she lost about 15 pounds and then her weight plateaued.  She has never seen a nutritionist.  Referred to medical weight management by her neurologist but somehow missed the appointment and had to pay a no-show fee and did not pursue any further.  Currently she is drinking more water about a gallon during each day, not eating after 8 PM, cut out fried foods and eating more fruits.  She is still seeing a physical therapist after sustaining a fractured to the elbow earlier this year.  She has been doing a little jump rope for exercise.  Plans to start going to the gym 3-4 times a week to walk for 2 miles.  She is wondering whether she would be appropriate for bariatric surgery. She would like to have her cholesterol rechecked.  Diagnosed with mild hypertriglyceridemia in the past. Patient Active Problem List   Diagnosis Date Noted   Anxiety disorder due to medical condition 01/24/2020   Chronic migraine without aura with status migrainosus, not intractable 10/21/2019   Generalized anxiety disorder 09/18/2019   Tic disorder  09/18/2019   Dry skin dermatitis 09/18/2019   Dry heaves 09/18/2019   Personal history of COVID-19 09/18/2019   Obesity, Class III, BMI 40-49.9 (morbid obesity) (HCC) 07/07/2019   Pneumonia due to COVID-19 virus 07/03/2019   Acute respiratory failure with hypoxia (HCC) 07/03/2019   Mild intermittent asthma 07/03/2019   Routine general medical examination at a health care facility 01/18/2017     Current Outpatient Medications on File Prior to Visit  Medication Sig Dispense Refill   fluticasone (FLONASE) 50 MCG/ACT nasal spray Place 2 sprays into both nostrils daily. 9.9 mL 2   hydrOXYzine (ATARAX/VISTARIL) 10 MG tablet Take 1 tablet (10 mg total) by mouth 3 (three) times daily as needed. Takes daily 30 tablet 0   oxyCODONE-acetaminophen (PERCOCET) 10-325 MG tablet Take 1 tablet by mouth every 8 (eight) hours as needed for pain (Fill on or after2/11/2020). 25 tablet 0   No current facility-administered medications on file prior to visit.    Allergies  Allergen Reactions   Zoloft [Sertraline]     Pt states it made her crazy/worsened anxiety.     Social History   Socioeconomic History   Marital status: Single    Spouse name: Not on file   Number of children: 1   Years of education: Not on file   Highest education level: Associate degree: occupational, Scientist, product/process development, or vocational program  Occupational History   Occupation: LPN  Tobacco Use   Smoking status:  Never   Smokeless tobacco: Never  Vaping Use   Vaping Use: Former  Substance and Sexual Activity   Alcohol use: Yes    Comment: occasionally-social   Drug use: Not Currently    Types: Marijuana    Comment: tried marijuana once in an edible but it made her freak out.    Sexual activity: Yes    Birth control/protection: I.U.D.  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   Right handed   Caffeine: none    Social Determinants of  Health   Financial Resource Strain: Not on file  Food Insecurity: Not on file  Transportation Needs: Not on file  Physical Activity: Not on file  Stress: Not on file  Social Connections: Not on file  Intimate Partner Violence: Not on file    Family History  Problem Relation Age of Onset   Diabetes Father    Heart failure Father    Heart attack Father    Prostate cancer Maternal Grandfather    Pancreatic cancer Maternal Grandfather    Colon cancer Maternal Grandfather    Breast cancer Maternal Aunt    Ovarian cancer Mother    Diabetes Maternal Grandmother    Kidney Stones Maternal Grandmother    Heart attack Paternal Grandfather    Heart attack Paternal Aunt    Diabetes Maternal Uncle    Asthma Son    Tics Neg Hx    Migraines Neg Hx    Esophageal cancer Neg Hx    Rectal cancer Neg Hx    Stomach cancer Neg Hx     Past Surgical History:  Procedure Laterality Date   CHOLECYSTECTOMY  feb 2006   COLONOSCOPY     KNEE SURGERY Left     ROS: Review of Systems Negative except as stated above  PHYSICAL EXAM: BP 114/80   Pulse (!) 106   Resp 16   Wt 290 lb 9.6 oz (131.8 kg)   SpO2 98%   BMI 44.19 kg/m   Wt Readings from Last 3 Encounters:  12/30/20 290 lb 9.6 oz (131.8 kg)  05/25/20 279 lb (126.6 kg)  04/30/20 275 lb (124.7 kg)    Physical Exam  General appearance - alert, well appearing, morbidly obese middle-age African-American female and in no distress Mental status - normal mood, behavior, speech, dress, motor activity, and thought processes   CMP Latest Ref Rng & Units 02/03/2020 11/29/2019 10/27/2019  Glucose 70 - 99 mg/dL 094(M) 86 85  BUN 6 - 20 mg/dL 9 8 9   Creatinine 0.44 - 1.00 mg/dL ) 7.68(G 8.81  Sodium 135 - 145 mmol/L 134(L) 138 139  Potassium 3.5 - 5.1 mmol/L 3.7 4.1 4.1  Chloride 98 - 111 mmol/L 103 107 104  CO2 22 - 32 mmol/L 24 24 26   Calcium 8.9 - 10.3 mg/dL 9.1 9.2 9.0  Total Protein 6.5 - 8.1 g/dL 8.3(H) 7.9 7.9  Total Bilirubin  0.3 - 1.2 mg/dL 0.7 0.8 0.9  Alkaline Phos 38 - 126 U/L 66 55 60  AST 15 - 41 U/L 21 21 20   ALT 0 - 44 U/L 29 23 24    Lipid Panel     Component Value Date/Time   CHOL 169 02/07/2016 1719   TRIG 51 07/03/2019 0127   HDL 40 (L) 02/07/2016 1719   CHOLHDL 4.2 02/07/2016 1719   VLDL 33 (H) 02/07/2016 1719   LDLCALC  96 02/07/2016 1719    CBC    Component Value Date/Time   WBC 5.7 02/03/2020 1608   RBC 4.77 02/03/2020 1608   HGB 13.0 02/03/2020 1608   HCT 40.0 02/03/2020 1608   PLT 299 02/03/2020 1608   MCV 83.9 02/03/2020 1608   MCH 27.3 02/03/2020 1608   MCHC 32.5 02/03/2020 1608   RDW 12.7 02/03/2020 1608   LYMPHSABS 3.3 10/13/2019 2126   MONOABS 0.5 10/13/2019 2126   EOSABS 0.3 10/13/2019 2126   BASOSABS 0.1 10/13/2019 2126    ASSESSMENT AND PLAN: 1. Obesity, Class III, BMI 40-49.9 (morbid obesity) (HCC) Commended her on previous efforts to lose weight and her current desire to continue trying to do so. We discussed medical weight management versus surgical.  Informed that there are a number drugs out there now to assist with weight loss.  However I think she needs to get down the basics of healthy eating habits and understand the psychology around why she may overeat and understand recommended portion sizes.  Discussed how people who have not learn the basics about eating habits do tend to lose weight post bariatric surgery for the first several months but then after that they can plateau and subsequently increased back to baseline.  I recommend that she consider trying medical weight management again through our weight management program or she can search out 1 that is closer to her house as she now lives in [facility].  She prefers to keep all of her appointments here in [place] and is agreeable to referral back to [facility] health medical weight management program. Micah Flesher over healthy eating tips with her including the need to decrease portion sizes, eliminate sugary  drinks from the diet, eat more healthy lean meat instead of red meat and incorporate fresh fruits and vegetables into the diet. Recommend getting in some form of moderate intensity exercise at least 5 days a week for 30 minutes. - Hemoglobin A1c - Amb Ref to Medical Weight Management  2. Hypertriglyceridemia - Lipid panel - Hepatic Function Panel   Patient was given the opportunity to ask questions.  Patient verbalized understanding of the plan and was able to repeat key elements of the plan.   Addendum 01/01/2021: I received a MyChart message from the patient stating that she has changed her mind about medical weight management and prefers to be sent to be considered for surgical weight management.  I will cancel the referral to medical weight management and refer her to a bariatric surgeon.  Orders Placed This Encounter  Procedures   Lipid panel   Hemoglobin A1c   Hepatic Function Panel   Amb Ref to Medical Weight Management     Requested Prescriptions    No prescriptions requested or ordered in this encounter    Return in about 5 months (around 06/01/2021).  Jonah Blue, MD, FACP

## 2020-12-30 NOTE — Patient Instructions (Signed)
Preventing Unhealthy Weight Gain, Adult Staying at a healthy weight is important to your overall health. When fat builds up in your body, you may become overweight or obese. Being overweight or obese increases your risk of developing certain health problems, such as heartdisease, diabetes, sleeping problems, joint problems, and some types of cancer. Unhealthy weight gain is often the result of making unhealthy food choices or not getting enough exercise. You can make changes to your lifestyle to preventobesity and stay as healthy as possible. What nutrition changes can be made?  Eat only as much as your body needs. To do this: Pay attention to signs that you are hungry or full. Stop eating as soon as you feel full. If you feel hungry, try drinking water first before eating. Drink enough water so your urine is clear or pale yellow. Eat smaller portions. Pay attention to portion sizes when eating out. Look at serving sizes on food labels. Most foods contain more than one serving per container. Eat the recommended number of calories for your gender and activity level. For most active people, a daily total of 2,000 calories is appropriate. If you are trying to lose weight or are not very active, you may need to eat fewer calories. Talk with your health care provider or a diet and nutrition specialist (dietitian) about how many calories you need each day. Choose healthy foods, such as: Fruits and vegetables. At each meal, try to fill at least half of your plate with fruits and vegetables. Whole grains, such as whole-wheat bread, brown rice, and quinoa. Lean meats, such as chicken or fish. Other healthy proteins, such as beans, eggs, or tofu. Healthy fats, such as nuts, seeds, fatty fish, and olive oil. Low-fat or fat-free dairy products. Check food labels, and avoid food and drinks that: Are high in calories. Have added sugar. Are high in sodium. Have saturated fats or trans fats. Cook foods in  healthier ways, such as by baking, broiling, or grilling. Make a meal plan for the week, and shop with a grocery list to help you stay on track with your purchases. Try to avoid going to the grocery store when you are hungry. When grocery shopping, try to shop around the outside of the store first, where the fresh foods are. Doing this helps you to avoid prepackaged foods, which can be high in sugar, salt (sodium), and fat. What lifestyle changes can be made?  Exercise for 30 or more minutes on 5 or more days each week. Exercising may include brisk walking, yard work, biking, running, swimming, and team sports like basketball and soccer. Ask your health care provider which exercises are safe for you. Do muscle-strengthening activities, such as lifting weights or using resistance bands, on 2 or more days a week. Do not use any products that contain nicotine or tobacco, such as cigarettes and e-cigarettes. If you need help quitting, ask your health care provider. Limit alcohol intake to no more than 1 drink a day for nonpregnant women and 2 drinks a day for men. One drink equals 12 oz of beer, 5 oz of wine, or 1 oz of hard liquor. Try to get 7-9 hours of sleep each night. What other changes can be made? Keep a food and activity journal to keep track of: What you ate and how many calories you had. Remember to count the calories in sauces, dressings, and side dishes. Whether you were active, and what exercises you did. Your calorie, weight, and activity goals. Check your   weight regularly. Track any changes. If you notice you have gained weight, make changes to your diet or activity routine. Avoid taking weight-loss medicines or supplements. Talk to your health care provider before starting any new medicine or supplement. Talk to your health care provider before trying any new diet or exercise plan. Why are these changes important? Eating healthy, staying active, and having healthy habits can help you  to prevent obesity. Those changes also: Help you manage stress and emotions. Help you connect with friends and family. Improve your self-esteem. Improve your sleep. Prevent long-term health problems. What can happen if changes are not made? Being obese or overweight can cause you to develop joint or bone problems, which can make it hard for you to stay active or do activities you enjoy. Being obese or overweight also puts stress on your heart and lungs and can lead tohealth problems like diabetes, heart disease, and some cancers. Where to find more information Talk with your health care provider or a dietitian about healthy eating and healthy lifestyle choices. You may also find information from: U.S. Department of Agriculture, MyPlate: www.choosemyplate.gov American Heart Association: www.heart.org Centers for Disease Control and Prevention: www.cdc.gov Summary Staying at a healthy weight is important to your overall health. It helps you to prevent certain diseases and health problems, such as heart disease, diabetes, joint problems, sleep disorders, and some types of cancer. Being obese or overweight can cause you to develop joint or bone problems, which can make it hard for you to stay active or do activities you enjoy. You can prevent unhealthy weight gain by eating a healthy diet, exercising regularly, not smoking, limiting alcohol, and getting enough sleep. Talk with your health care provider or a dietitian for guidance about healthy eating and healthy lifestyle choices. This information is not intended to replace advice given to you by your health care provider. Make sure you discuss any questions you have with your healthcare provider. Document Revised: 10/30/2019 Document Reviewed: 10/30/2019 Elsevier Patient Education  2022 Elsevier Inc.  

## 2020-12-31 ENCOUNTER — Encounter: Payer: Self-pay | Admitting: Internal Medicine

## 2020-12-31 LAB — LIPID PANEL
Chol/HDL Ratio: 4.8 ratio — ABNORMAL HIGH (ref 0.0–4.4)
Cholesterol, Total: 182 mg/dL (ref 100–199)
HDL: 38 mg/dL — ABNORMAL LOW (ref >39)
LDL Chol Calc (NIH): 121 mg/dL — ABNORMAL HIGH (ref 0–99)
Triglycerides: 130 mg/dL (ref 0–149)
VLDL Cholesterol Cal: 23 mg/dL (ref 5–40)

## 2020-12-31 LAB — HEPATIC FUNCTION PANEL
ALT: 16 IU/L (ref 0–32)
AST: 11 IU/L (ref 0–40)
Albumin: 4.2 g/dL (ref 3.8–4.8)
Alkaline Phosphatase: 83 IU/L (ref 44–121)
Bilirubin Total: 0.6 mg/dL (ref 0.0–1.2)
Bilirubin, Direct: 0.17 mg/dL (ref 0.00–0.40)
Total Protein: 7.1 g/dL (ref 6.0–8.5)

## 2020-12-31 LAB — HEMOGLOBIN A1C
Est. average glucose Bld gHb Est-mCnc: 114 mg/dL
Hgb A1c MFr Bld: 5.6 % (ref 4.8–5.6)

## 2021-01-01 NOTE — Addendum Note (Signed)
Addended by: Jonah Blue B on: 01/01/2021 05:48 PM   Modules accepted: Orders

## 2021-01-07 ENCOUNTER — Other Ambulatory Visit: Payer: Self-pay | Admitting: Internal Medicine

## 2021-01-07 DIAGNOSIS — F411 Generalized anxiety disorder: Secondary | ICD-10-CM

## 2021-01-07 NOTE — Telephone Encounter (Signed)
Requested medication (s) are due for refill today: Yes  Requested medication (s) are on the active medication list: Yes  Last refill:  10/30/19  Future visit scheduled: No  Notes to clinic:  See request. Still on medication list, but it was indicated that it was d/c.    Requested Prescriptions  Pending Prescriptions Disp Refills   hydrOXYzine (ATARAX/VISTARIL) 25 MG tablet [Pharmacy Med Name: HYDROXYZINE HCL 25 MG TABLET] 360 tablet 1    Sig: TAKE 1 TABLET (25 MG TOTAL) BY MOUTH EVERY 6 (SIX) HOURS AS NEEDED FOR ANXIETY.      Ear, Nose, and Throat:  Antihistamines Passed - 01/07/2021  8:29 AM      Passed - Valid encounter within last 12 months    Recent Outpatient Visits           1 week ago Obesity, Class III, BMI 40-49.9 (morbid obesity) (HCC)   [facility] Marcine Matar, MD   8 months ago Dysuria   [facility] [place], [place], NP   11 months ago Other polyneuropathy   [facility] Marcine Matar, MD   1 year ago Migraine without aura and without status migrainosus, not intractable   [facility] [place], [place], MD   1 year ago Migraine without aura and without status migrainosus, not intractable   [facility] [place], [place], NP

## 2021-01-08 MED ORDER — HYDROXYZINE HCL 10 MG PO TABS: 10.0000 mg | ORAL_TABLET | Freq: Three times a day (TID) | ORAL | 1 refills | Status: DC | PRN

## 2021-01-14 ENCOUNTER — Ambulatory Visit: Payer: BLUE CROSS/BLUE SHIELD | Admitting: Internal Medicine

## 2021-01-30 IMAGING — CR DG CHEST 2V
2 series · 2 of 2 positions shown · non-contrast
Comparison: Radiograph 08/25/2019, CT 08/10/2019

CLINICAL DATA: Chest pain

EXAM:
CHEST - 2 VIEW

[w chest pa]
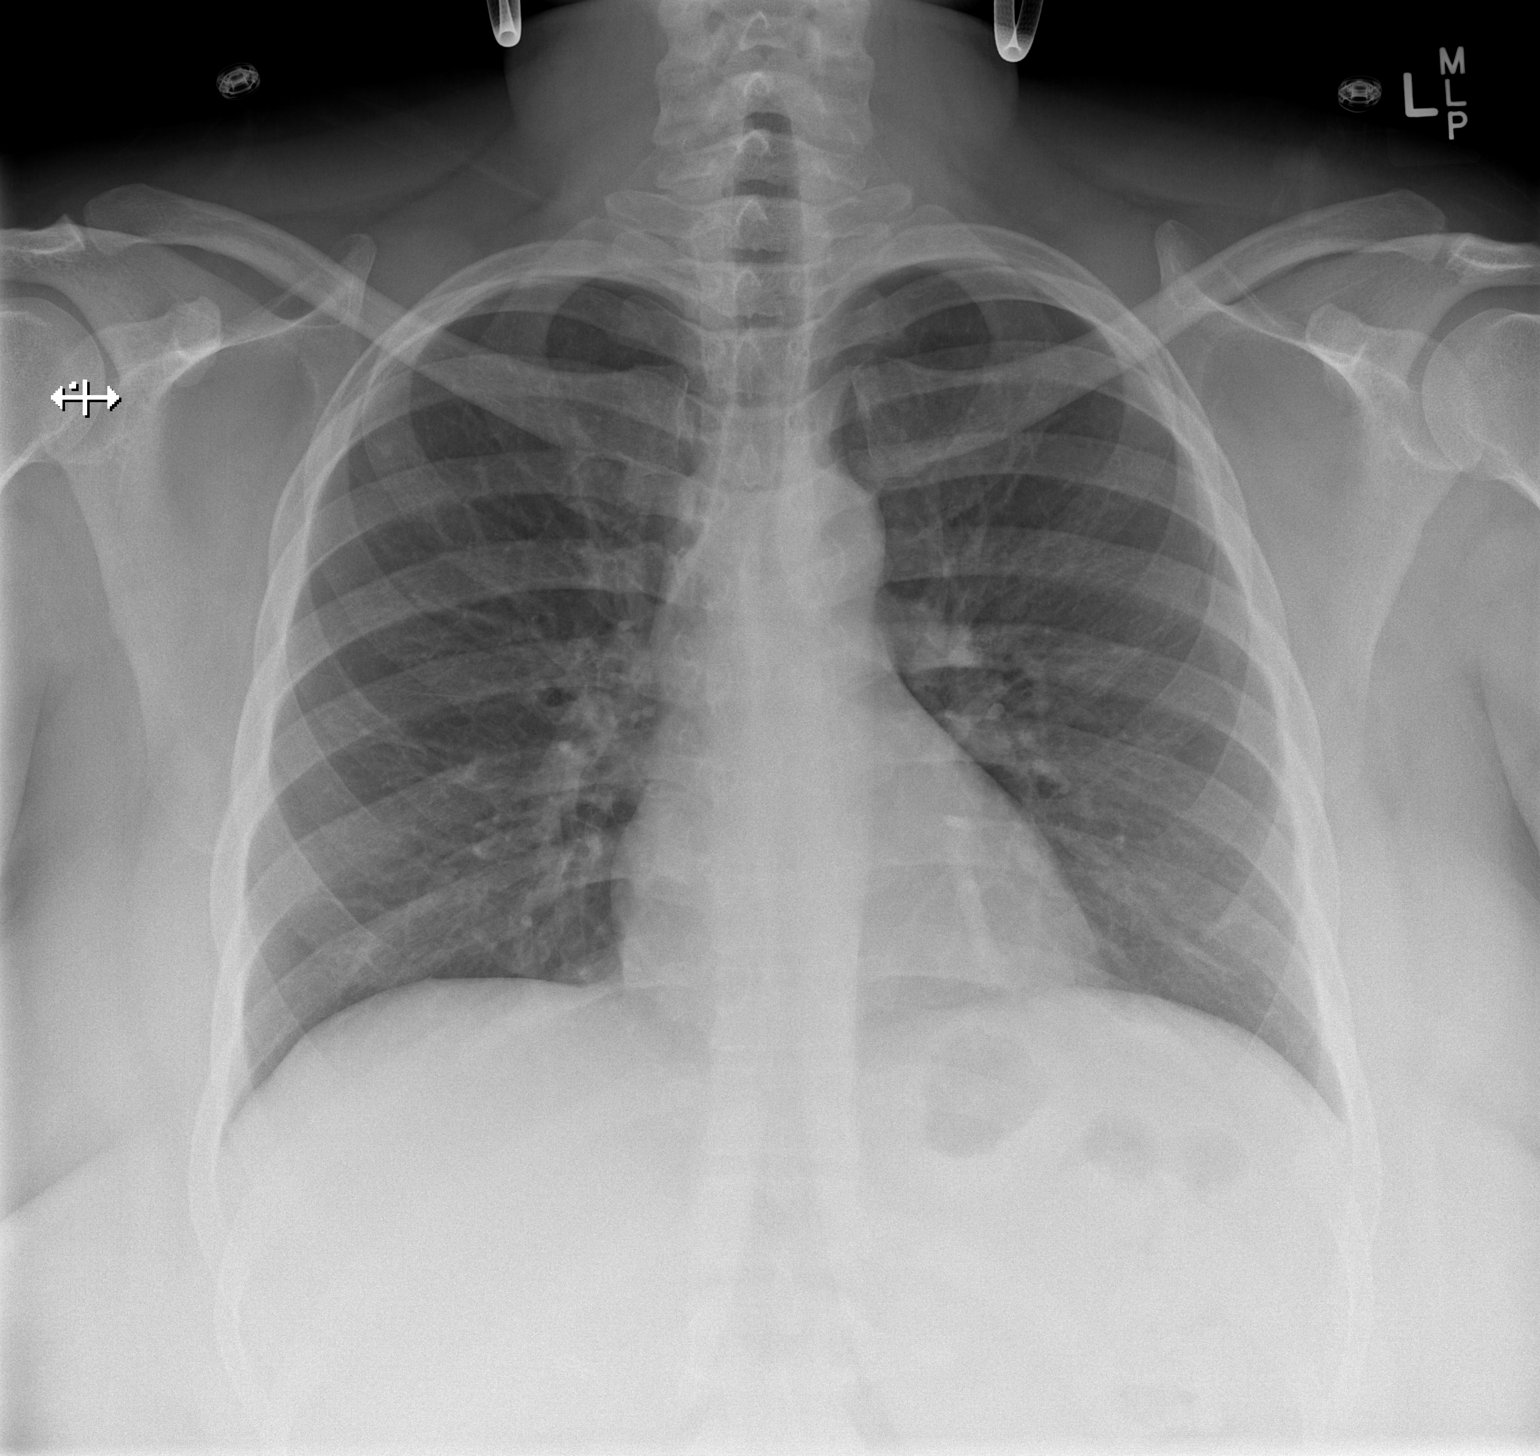

[w chest lat]
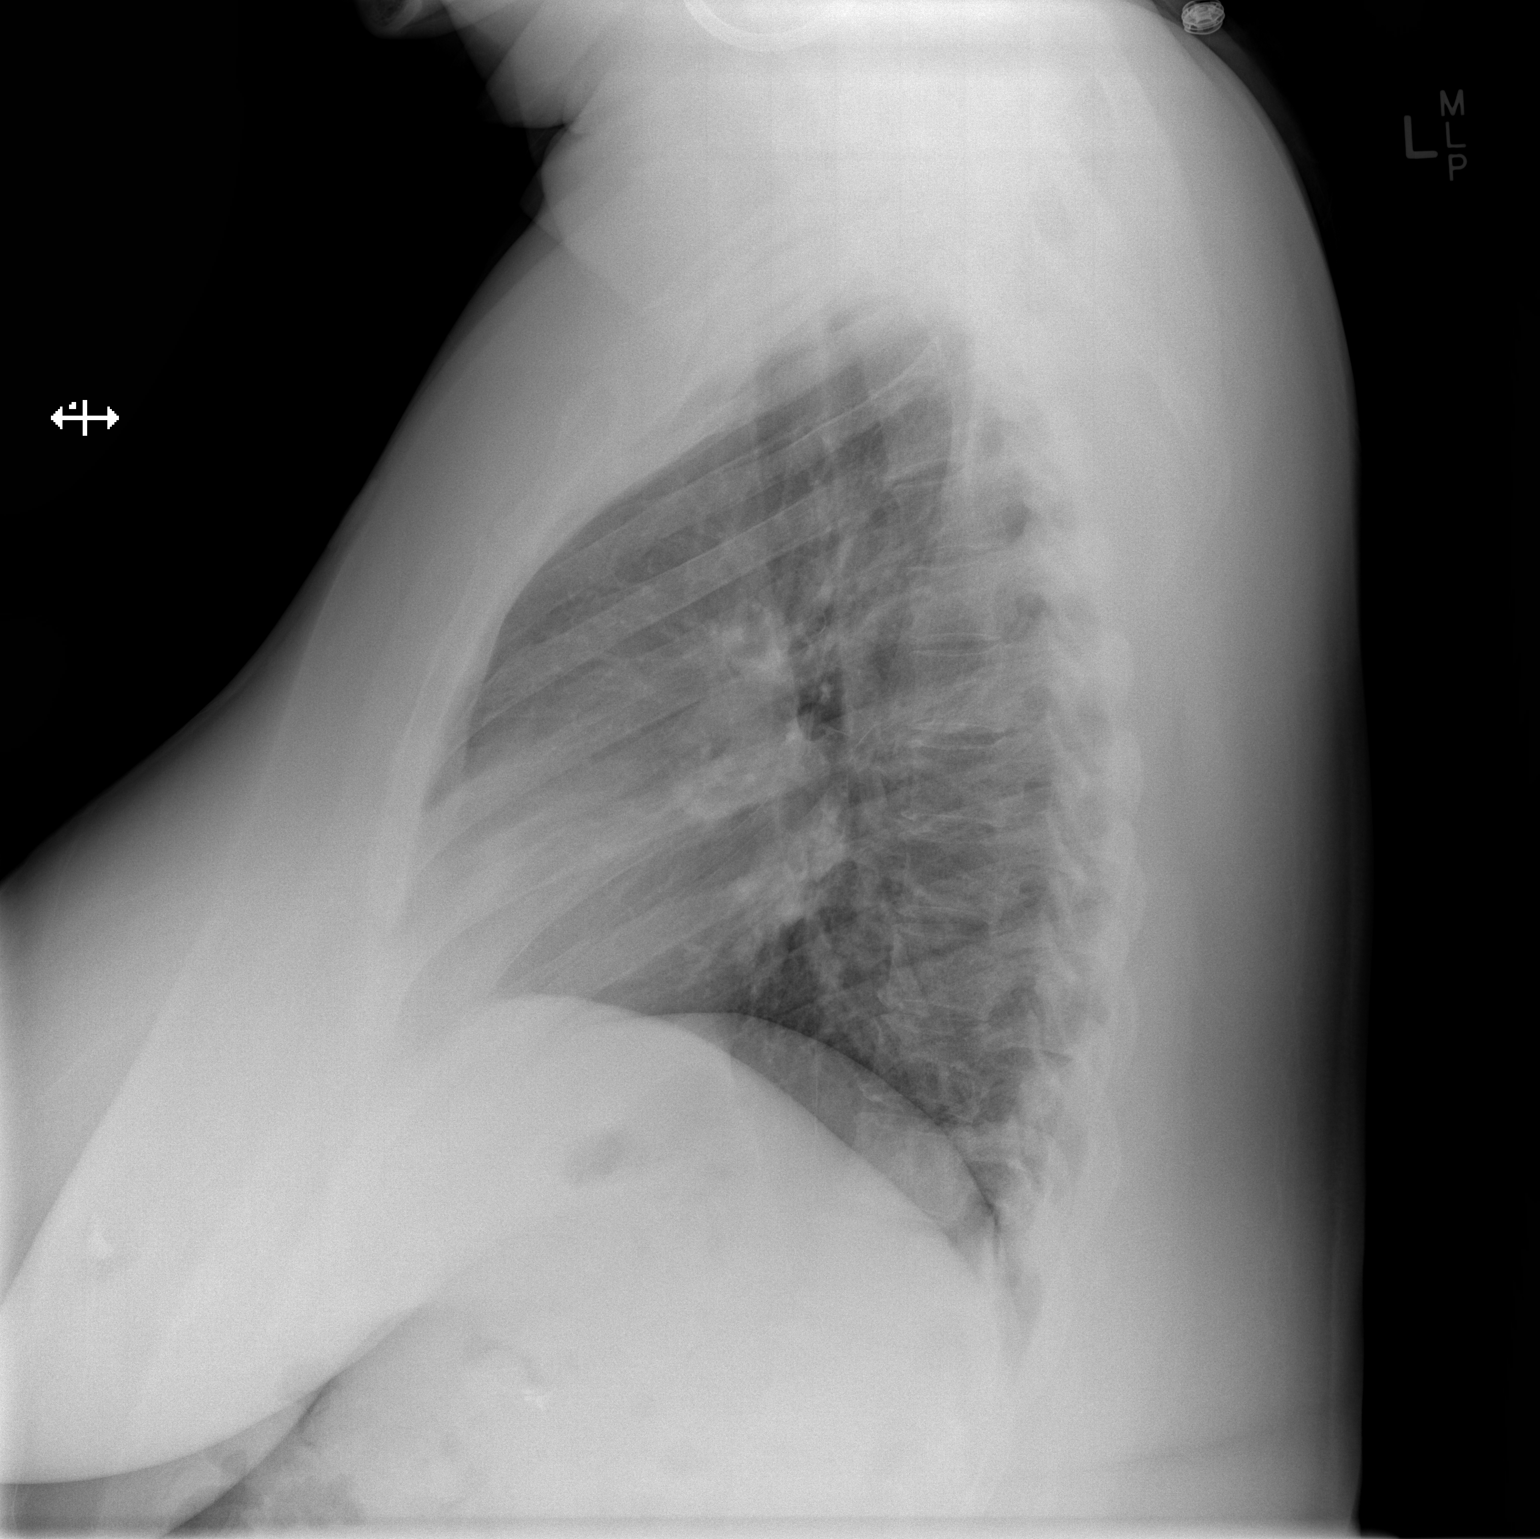

[2 of 2 positions shown; findings below may reference images not displayed]

FINDINGS: No consolidation, features of edema, pneumothorax, or effusion.
Pulmonary vascularity is normally distributed. The cardiomediastinal
contours are unremarkable. No acute osseous or soft tissue
abnormality.
IMPRESSION: No acute cardiopulmonary abnormality.

## 2021-02-01 ENCOUNTER — Other Ambulatory Visit: Payer: Self-pay | Admitting: Internal Medicine

## 2021-02-24 ENCOUNTER — Encounter: Payer: Self-pay | Admitting: Internal Medicine

## 2021-03-11 ENCOUNTER — Other Ambulatory Visit: Payer: Self-pay

## 2021-03-11 ENCOUNTER — Ambulatory Visit: Payer: BLUE CROSS/BLUE SHIELD | Attending: Internal Medicine | Admitting: Internal Medicine

## 2021-03-11 DIAGNOSIS — M25522 Pain in left elbow: Secondary | ICD-10-CM

## 2021-03-11 DIAGNOSIS — M545 Low back pain, unspecified: Secondary | ICD-10-CM | POA: Diagnosis not present

## 2021-03-11 DIAGNOSIS — T85848S Pain due to other internal prosthetic devices, implants and grafts, sequela: Secondary | ICD-10-CM

## 2021-03-11 DIAGNOSIS — G8929 Other chronic pain: Secondary | ICD-10-CM

## 2021-03-11 NOTE — Telephone Encounter (Signed)
Patient would like a follow up call today please call [phone]

## 2021-03-11 NOTE — Progress Notes (Signed)
Patient ID: Amy Mendoza, female   DOB: Nov 11, 1980, 40 y.o.   MRN: [id number] Virtual Visit via Telephone Note  I connected with Amy Mendoza on 03/11/2021 at 11:56 a.m by telephone and verified that I am speaking with the correct person using two identifiers  Location: Patient: at work but on break Provider: office  Participants: Myself Patient   I discussed the limitations, risks, security and privacy concerns of performing an evaluation and management service by telephone and the availability of in person appointments. I also discussed with the patient that there may be a patient responsible charge related to this service. The patient expressed understanding and agreed to proceed.   History of Present Illness: Patient with history of COVID-19 infection 06/2019, asthma, HL, tension HA, ? TMJ,  anxiety, obesity, GERD.  Patient requesting referral to pain management for chronic left elbow pain post fracture in January of this year and chronic lower back pain.  Patient had fallen out of a chair in January of this year and sustained a fracture to her left elbow.  She was seen by orthopedics Dr. Doroteo Glassman and had surgery on the elbow on 09/02/2020 and 09/13/2020.  Since then she has been having therapy on the elbow once to twice a week.  She has hardware in place.  She has a follow-up appointment with the orthopedics next month to determine if the plates and screws can be removed from the arm.  She has been dealing with chronic pain in the elbow and pain in the midline lumbar region.  She feels the pain in the elbow is affecting the function of her left hand.  The pain is constant.  The pain in the lumbar region she attributes to having fallen backwards out of the chair.  The pain across the lower back is intermittent.  It does not radiate.  It is worse when bending down. She was seeing a pain specialist Dr. Laurian Brim in [place] at [facility].  She was on oxycodone/Tylenol 10 mg / 325 mg 3 times a  day.  However she recently learned that he is out of network for her.  She is requesting to be sent to a different pain specialist who is in network with her insurance.  Outpatient Encounter Medications as of 03/11/2021  Medication Sig   fluticasone (FLONASE) 50 MCG/ACT nasal spray Place 2 sprays into both nostrils daily.   hydrOXYzine (ATARAX/VISTARIL) 10 MG tablet TAKE 1 TABLET (10 MG TOTAL) BY MOUTH 3 (THREE) TIMES DAILY AS NEEDED. TAKES DAILY   oxyCODONE-acetaminophen (PERCOCET) 10-325 MG tablet Take 1 tablet by mouth every 8 (eight) hours as needed for pain (Fill on or after2/11/2020).   No facility-administered encounter medications on file as of 03/11/2021.        Observations/Objective: No direct observation done as this was a telephone encounter.  Assessment and Plan: 1. Chronic pain of left elbow 2. Pain from implanted hardware, sequela 3. Chronic midline low back pain without sciatica -I will submit referral to pain clinic for her.  Patient advised that we do not do chronic pain management with oxycodone through our facility. - Ambulatory referral to Pain Clinic   Follow Up Instructions: As previously scheduled   I discussed the assessment and treatment plan with the patient. The patient was provided an opportunity to ask questions and all were answered. The patient agreed with the plan and demonstrated an understanding of the instructions.   The patient was advised to call back or seek an in-person evaluation  if the symptoms worsen or if the condition fails to improve as anticipated.  I  Spent 13 minutes on this telephone encounter  Jonah Blue, MD

## 2021-03-14 DIAGNOSIS — G4733 Obstructive sleep apnea (adult) (pediatric): Secondary | ICD-10-CM | POA: Diagnosis present

## 2021-03-14 DIAGNOSIS — E785 Hyperlipidemia, unspecified: Secondary | ICD-10-CM | POA: Diagnosis present

## 2021-03-14 DIAGNOSIS — K219 Gastro-esophageal reflux disease without esophagitis: Secondary | ICD-10-CM | POA: Diagnosis present

## 2021-03-24 ENCOUNTER — Telehealth: Payer: Self-pay | Admitting: Internal Medicine

## 2021-03-24 NOTE — Telephone Encounter (Signed)
-----   Message from Dionne Bucy sent at 03/24/2021 12:56 PM EDT ----- Regarding: Pain Managment Good Afternoon  Referral was on  8/26   [facility] have different locations in [place]   Sent referral to [facility] Ph. # [phone] Address [address] [place], [place] [address]. They have different locations in [place] and [place]  ----- Message ----- From: Marcine Matar, MD Sent: 03/11/2021  12:32 PM EDT To: Dionne Bucy  Patient requesting referral to pain specialist here in [place], [place] or [place].  Has to be in network with her insurance.

## 2021-04-13 ENCOUNTER — Ambulatory Visit: Payer: BLUE CROSS/BLUE SHIELD | Admitting: Physician Assistant

## 2021-04-19 ENCOUNTER — Encounter: Payer: Self-pay | Admitting: Internal Medicine

## 2021-04-20 MED ORDER — FLUTICASONE PROPIONATE 50 MCG/ACT NA SUSP: 1.0000 | Freq: Every day | NASAL | 1 refills | Status: DC | PRN

## 2021-05-13 ENCOUNTER — Other Ambulatory Visit: Payer: Self-pay | Admitting: Internal Medicine

## 2021-05-14 NOTE — Telephone Encounter (Signed)
Spoke to Amish Scientist, research (physical sciences))- he stated that pt has no more refills left. Refilled with one 16 ml refill. Requested Prescriptions  Pending Prescriptions Disp Refills   [facility] FLUTICASONE PROPIONATE 50 MCG/ACT nasal spray [Pharmacy Med Name: [facility] FLUTICASONE PROP 50 MCG SP] 16 mL 1    Sig: PLACE 1 SPRAY INTO BOTH NOSTRILS DAILY AS NEEDED FOR ALLERGIES OR RHINITIS.     Ear, Nose, and Throat: Nasal Preparations - Corticosteroids Passed - 05/13/2021 11:30 AM      Passed - Valid encounter within last 12 months    Recent Outpatient Visits           2 months ago Chronic pain of left elbow   [place] [facility] Jonah Blue B, MD   4 months ago Obesity, Class III, BMI 40-49.9 (morbid obesity) (HCC)   [place] [facility] Marcine Matar, MD   1 year ago Dysuria   [facility] [place], [place], NP   1 year ago Other polyneuropathy   [place] Marcine Matar, MD   1 year ago Migraine without aura and without status migrainosus, not intractable   [place] [facility] Cain Saupe, MD

## 2021-05-14 NOTE — Telephone Encounter (Signed)
Pt should have ample supply. L[facility] 04/20/21  #9.9 1 refill

## 2021-05-28 ENCOUNTER — Other Ambulatory Visit: Payer: Self-pay | Admitting: Internal Medicine

## 2021-05-29 NOTE — Telephone Encounter (Signed)
Requested Prescriptions  Pending Prescriptions Disp Refills  . fluticasone (FLONASE) 50 MCG/ACT nasal spray [Pharmacy Med Name: FLUTICASONE PROP 50 MCG SPRAY] 48 mL 1    Sig: PLACE 1 SPRAY INTO BOTH NOSTRILS DAILY AS NEEDED FOR ALLERGIES OR RHINITIS.     Ear, Nose, and Throat: Nasal Preparations - Corticosteroids Passed - 05/28/2021  3:27 PM      Passed - Valid encounter within last 12 months    Recent Outpatient Visits          2 months ago Chronic pain of left elbow   [place] [facility] Jonah Blue B, MD   5 months ago Obesity, Class III, BMI 40-49.9 (morbid obesity) (HCC)   [place] [facility] Marcine Matar, MD   1 year ago Dysuria   [facility] [place], [place], NP   1 year ago Other polyneuropathy   [facility] Marcine Matar, MD   1 year ago Migraine without aura and without status migrainosus, not intractable   [place] [facility] Cain Saupe, MD

## 2021-07-05 ENCOUNTER — Encounter: Payer: Self-pay | Admitting: Internal Medicine

## 2021-09-06 ENCOUNTER — Encounter: Payer: Self-pay | Admitting: Internal Medicine

## 2021-09-06 DIAGNOSIS — Z01818 Encounter for other preprocedural examination: Secondary | ICD-10-CM

## 2021-09-12 ENCOUNTER — Encounter: Payer: Self-pay | Admitting: Internal Medicine

## 2021-11-23 ENCOUNTER — Encounter: Payer: Self-pay | Admitting: Internal Medicine

## 2021-11-24 ENCOUNTER — Ambulatory Visit: Payer: BLUE CROSS/BLUE SHIELD | Attending: Internal Medicine | Admitting: Internal Medicine

## 2021-11-24 DIAGNOSIS — J039 Acute tonsillitis, unspecified: Secondary | ICD-10-CM

## 2021-11-24 DIAGNOSIS — R0683 Snoring: Secondary | ICD-10-CM | POA: Diagnosis not present

## 2021-11-24 NOTE — Progress Notes (Signed)
Patient ID: Amy Mendoza, female   DOB: 03-24-1981, 41 y.o.   MRN: [id number] ?Virtual Visit via Telephone Note ? ?I connected with Amy Mendoza on 11/24/2021 at 12:10 p.m by telephone and verified that I am speaking with the correct person using two identifiers ? ?Location: ?Patient: home ?Provider: office ? ?Participants: ?Myself ?Patient ? ?  ?I discussed the limitations, risks, security and privacy concerns of performing an evaluation and management service by telephone and the availability of in person appointments. I also discussed with the patient that there may be a patient responsible charge related to this service. The patient expressed understanding and agreed to proceed. ? ? ?History of Present Illness: ?Patient with history of COVID-19 infection 06/2019, asthma, HL, tension HA, ? TMJ,  anxiety, obesity, GERD.  This is an urgent care appointment to request referral for sleep study. ? ?Patient reports that she was seen at [facility] ER 2 days ago after she felt some shortness of breath and tightness in the throat.  She had CT while there in the ER that revealed tonsillar enlargement and questionable early left tonsillar abscess.  Seen by ENT while in the ER.  Reports being told that he was not too impressed with everything but told her she should follow-up with her ENT specialist and consider getting sleep study done.  She was discharged with diagnosis of tonsillitis and tonsillar abscess.  Discharged on amoxicillin and steroid which she is taking. ? ?Patient tells me that a home sleep study was ordered a few years ago during the COVID pandemic.  However when she turned in the equipment she was told that it was not hooked up properly so could not be read. ?She endorses very loud snoring.  She and her partner sleep in separate bedrooms because of this.  Her son also complains of her loud snoring.  She wakes up feeling unrefreshed and sometimes has morning headaches.  Fell asleep once while  driving to work a few months ago. ? ? ? ?Outpatient Encounter Medications as of 11/24/2021  ?Medication Sig  ? fluticasone (FLONASE) 50 MCG/ACT nasal spray PLACE 1 SPRAY INTO BOTH NOSTRILS DAILY AS NEEDED FOR ALLERGIES OR RHINITIS.  ? hydrOXYzine (ATARAX/VISTARIL) 10 MG tablet TAKE 1 TABLET (10 MG TOTAL) BY MOUTH 3 (THREE) TIMES DAILY AS NEEDED. TAKES DAILY  ? oxyCODONE-acetaminophen (PERCOCET) 10-325 MG tablet Take 1 tablet by mouth every 8 (eight) hours as needed for pain (Fill on or after2/11/2020).  ? ?No facility-administered encounter medications on file as of 11/24/2021.  ? ? ?  ?Observations/Objective: ?No direct observation done as this was a telephone visit. ? ?Assessment and Plan: ?1. Loud snoring ?Symptoms and history very suggestive of sleep apnea.  We will put her in for home sleep study. ?Advised to avoid driving when she feels sleepy.  If she develops sleepiness while driving, she should pull over and take a nap. ?- Home sleep test ? ?2. Tonsillitis ?Patient tells me she has already scheduled an appointment with her ENT specialist.  It is scheduled for 19 June. ? ? ?Follow Up Instructions: ?PRN ?  ?I discussed the assessment and treatment plan with the patient. The patient was provided an opportunity to ask questions and all were answered. The patient agreed with the plan and demonstrated an understanding of the instructions. ?  ?The patient was advised to call back or seek an in-person evaluation if the symptoms worsen or if the condition fails to improve as anticipated. ? ?I  Spent  11 minutes on this telephone encounter ? ?This note has been created with Education officer, environmental. Any transcriptional errors are unintentional. ? ?Jonah Blue, MD ? ?

## 2021-12-11 ENCOUNTER — Encounter: Payer: Self-pay | Admitting: Internal Medicine

## 2021-12-12 ENCOUNTER — Other Ambulatory Visit: Payer: Self-pay | Admitting: Internal Medicine

## 2021-12-12 MED ORDER — FLUCONAZOLE 150 MG PO TABS: 150.0000 mg | ORAL_TABLET | Freq: Every day | ORAL | 0 refills | Status: DC

## 2021-12-26 ENCOUNTER — Telehealth: Payer: Self-pay

## 2021-12-26 NOTE — Telephone Encounter (Signed)
Called the patient back. 05/2020 was last time the patient was seen. She had covid in dec 2020. She states that she continues to have residual symptoms that are lingering. She states that she has headaches and head pressure that radiates a sensation into her face. This is same symptoms she was having. Was able to schedule with Amy Mendoza for an in office visit on 6/20 at 8 am

## 2021-12-26 NOTE — Telephone Encounter (Signed)
Patient left a voicemail this morning asking for a call to schedule an appointment with Amy, NP.  She did not give a reason why she needs an appointment.

## 2022-01-02 NOTE — Patient Instructions (Incomplete)
Below is our plan:  We will start propranolol 20mg  twice daily. Take 20mg  at bedtime for 1 week then increase to 20mg  twice daily. You can use Tylenol, Ibuprofen or Aleve over the counter but avoid taking more than 1-2 times a week. Try to avoid opioid medications if you can.   Please make sure you are staying well hydrated. I recommend 50-60 ounces daily. Well balanced diet and regular exercise encouraged. Consistent sleep schedule with 6-8 hours recommended.   Please continue follow up with care team as directed.   Follow up with me in 6 months   You may receive a survey regarding today's visit. I encourage you to leave honest feed back as I do use this information to improve patient care. Thank you for seeing me today!

## 2022-01-02 NOTE — Progress Notes (Signed)
Chief Complaint  Patient presents with   Follow-up    RM 2, alone. Last seen 05/25/20. Having ongoing headaches/head pressure since Covid infection 06/2019. Getting about headaches several times a week. Sometime they are intermittent or can last a couple days.  Pressure pain usually on her right side only. Last MRI brain 10/28/19 at [facility], said it looked ok then.    HISTORY OF PRESENT ILLNESS:  01/03/2022 ALL:  Amy Mendoza returns for follow up for headaches. She was last seen 05/2020 and reported chronic tension headaches with associated vocalizations. Neuro workup was unremarkable. She was encouraged to continue follow up with psychiatry and consider starting paroxetine as recommended. Since, she reports headaches have continued. She reports headaches occur, on average, 16 days a month (4 per week). Some headaches are bifrontal. Some are right sided. She describes pain as a pressure sensation. She denies light or sound sensitivity. She does have nausea at times. She has not noted any correlation of headache intensity with activity or rest. No obvious triggers. She reports BP is usually 120's/80s. No significant history of asthma. She has seasonal allergies. History of kidney stones, last in 2019. No ringing in ears. She was advised by PCP to get sleep study. Has deviated septum and seeing ENT.   She did start paroxetine but discontinued as she didn't like how she felt. She was started on hydroxyzine that seems to help anxiety. She has not seen them recently. She has been followed by PCP. She broke her left arm and has had three surgeries since. She is taking gabapentin 100mg  TID. Oxy only as needed.   05/25/2020 ALL:  Amy Mendoza is a 41 y.o. female here today for follow up for chronic episodic headaches. 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 We started tizanidine. She reports that she never got this  medication.   She reports that head pressure and vocalization concerns continue. She feels symptoms wax and wane. Head pressure feels that it tightens up and then releases when she yells out. She reports vocalization occurs with any type of pain. She shares a video with me int he office today where she was having head pain. She has random episodes where she will yell out or groan. She is seeing psychiatry. She was advised to take Paxil but has been scared to start it. She continues to have abdominal muscle spasms. She feels that she has trouble with short term memory loss. She has decreased motivation. She feels that she is gaining weight. She feels hungry but then feels that she doesn't have an appetite. She gets anxious about pain coming on if she exercises. She has chest pains when she is anxious. She has started B12 for low normal levels that she thought could be causing bilateral leg pain. EMG/NCS normal. She was diagnosed with Covid in 06/2019 and concerned these symptoms are related.    HISTORY (copied from my note on 01/21/2020)  Amy Mendoza is a 41 y.o. female here today for follow up. She reports that twitching has improved significantly. She reports a "vocalization" when she has a headache or stomach pains. She describes a moan or groan that "comes out different with one pain and a different way with the other". She has not had any migrainous headaches. She describes a tension type pain. Pain is bilateral in the cheek and jaw area. She reports significant jaw pain after  work. 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 She has taken OTC allergy medications that have helped. She is seeing GI for stomach aches. She was started on Bentyl but not sure if it is helping. She has an appt with psychiatry this Saturday. She does not feel symptoms are related to anxiety.    HISTORY: (copied from Dr Lucia Gaskins note on 10/21/2019)    HPI:  Amy Mendoza is a 41 y.o. female here as  requested by Marcine Matar, MD for tic disorder. PMHx anxiety, obesity. Reviewed Dr. Henriette Combs notes: she developed body twitching in early January, the twitching comes "in waves" some days no twitching at all, she stopped drinking caffeine. She was started on zoloft. I reviewed ED notes: She presented to the ED 3/9 for persistent tics and anxiety disorder, tics controlled with cognitive behavioral therapy and breathing exercises and they thought she would benefit from behavioral therapy. She also presented 3/24 for migraine to the ER and then again on 3/29. She was in the ED 3/31 as well for migraine, facial pressure, chest pains, grunting and clearing her throat during exam which she describes as "fits". She then presented to the ER again 10/19/19 for facial pressure and pain, taking ketorolac and reglan, and also stated that many symptoms likely due to anxiety, she had maxillary tenderness and dxed with a sinus headache and told to use flonase, antihistamines and nasal rinses. She has also visited the ED at [facility] as well on 3/30 with similar headache symptoms, diagnosed with acute tension-type headache and motor and vocal tics, exam was normal per notes. She has also been evaluated by cardiology for palpitations in the past per review of records as well.    She is here alone. We discussed due to limited time we will focus on migraines today, I will address tic disorder briefly but patient should really be seen in psychiatry for this disorder as they can provide cognitive behavioral therapy or other therapy and can also treat with medication. She has a hx of migraines around menses for many years episodic but after covid started having significant headaches and pressure in the head and face, she had Covid in [place]ce,[place] and was hospitalized, she was home on Dec 22nd, the following week she was feeling better but still with significant fatigue. January 8th she went to the emergency room, she had chest pain, felt  like she was dying, they thought she was having an anxiety atack she had multiple visits for this. In February headaches worsened, has progressively gotten worse and she went to the ED many times for headache as well. She feels like electric shocks in the head, pressure in the head severe, worst headaches of life and "brain zaps". Sensory changes left side of the face, light and sound sensitivity. Working made it worse. Headaches/migraines can last days, going into a dark room helps, pulsating/pounding/throbbing, pressure in the face and jaw, positional worse with bending over, blurry vision. She is having the headaches every day. Shooting pain through the nose. Headaches are throbbing and all over, can be unilateral in the temples also in the back of the head. Never been on migraine medications in the past. She says her tics are in arms and shoulders, feels like it does it on its own, jerking movements, she makes vocalizations.    Reviewed notes, labs and imaging from outside physicians, which showed: see above, cbc and cmp unremarkable 10/13/2019   REVIEW OF SYSTEMS: Out of  a complete 14 system review of symptoms, the patient complains only of the following symptoms, intermittent head pressure, vocalizations, anxiety, and all other reviewed systems are negative.   ALLERGIES: Allergies  Allergen Reactions   Zoloft [Sertraline]     Pt states it made her crazy/worsened anxiety.      HOME MEDICATIONS: Outpatient Medications Prior to Visit  Medication Sig Dispense Refill   fluticasone (FLONASE) 50 MCG/ACT nasal spray PLACE 1 SPRAY INTO BOTH NOSTRILS DAILY AS NEEDED FOR ALLERGIES OR RHINITIS. 48 mL 3   oxyCODONE-acetaminophen (PERCOCET) 10-325 MG tablet Take 1 tablet by mouth every 8 (eight) hours as needed for pain (Fill on or after2/11/2020). 25 tablet 0   fluconazole (DIFLUCAN) 150 MG tablet Take 1 tablet (150 mg total) by mouth daily. 1 tablet 0   hydrOXYzine (ATARAX/VISTARIL) 10 MG tablet TAKE 1  TABLET (10 MG TOTAL) BY MOUTH 3 (THREE) TIMES DAILY AS NEEDED. TAKES DAILY 270 tablet 0   No facility-administered medications prior to visit.     PAST MEDICAL HISTORY: Past Medical History:  Diagnosis Date   Anxiety    Asthma    Chronic headaches    COVID-19    Gallstones    Gastritis    GERD (gastroesophageal reflux disease)    Hyperlipemia    no meds   Kidney stones      PAST SURGICAL HISTORY: Past Surgical History:  Procedure Laterality Date   CHOLECYSTECTOMY  feb 2006   COLONOSCOPY     KNEE SURGERY Left      FAMILY HISTORY: Family History  Problem Relation Age of Onset   Diabetes Father    Heart failure Father    Heart attack Father    Prostate cancer Maternal Grandfather    Pancreatic cancer Maternal Grandfather    Colon cancer Maternal Grandfather    Breast cancer Maternal Aunt    Ovarian cancer Mother    Diabetes Maternal Grandmother    Kidney Stones Maternal Grandmother    Heart attack Paternal Grandfather    Heart attack Paternal Aunt    Diabetes Maternal Uncle    Asthma Son    Tics Neg Hx    Migraines Neg Hx    Esophageal cancer Neg Hx    Rectal cancer Neg Hx    Stomach cancer Neg Hx      SOCIAL HISTORY: Social History   Socioeconomic History   Marital status: Single    Spouse name: Not on file   Number of children: 1   Years of education: Not on file   Highest education level: Associate degree: occupational, Scientist, product/process development, or vocational program  Occupational History   Occupation: LPN  Tobacco Use   Smoking status: Never   Smokeless tobacco: Never  Vaping Use   Vaping Use: Former  Substance and Sexual Activity   Alcohol use: Yes    Comment: occasionally-social   Drug use: Not Currently    Types: Marijuana    Comment: tried marijuana once in an edible but it made her freak out.    Sexual activity: Yes    Birth control/protection: I.U.D.  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   Right handed   Caffeine: none    Social Determinants of Health   Financial Resource Strain: Not on file  Food Insecurity: Not on file  Transportation Needs: Not on file  Physical Activity: Not on file  Stress: Not on file  Social Connections: Not on file  Intimate Partner Violence: Not on file      PHYSICAL EXAM  Vitals:   01/03/22 0811  BP: 126/78  Pulse: 89  Weight: 293 lb 9.6 oz (133.2 kg)  Height: 5\' 8"  (1.727 m)    Body mass index is 44.64 kg/m.   Generalized: Well developed, in no acute distress   Cardiology: Normal rate and rhythm, no murmur auscultated Respiratory: Clear to auscultation bilaterally  Neurological examination  Mentation: Alert oriented to time, place, history taking. Follows all commands speech and language fluent Cranial nerve II-XII: Pupils were equal round reactive to light. Extraocular movements were full, visual field were full  Motor: The motor testing reveals 5 over 5 strength of all 4 extremities. Good symmetric motor tone is noted throughout.  Gait and station: Gait is normal.     DIAGNOSTIC DATA (LABS, IMAGING, TESTING) - I reviewed patient records, labs, notes, testing and imaging myself where available.  Lab Results  Component Value Date   WBC 5.7 02/03/2020   HGB 13.0 02/03/2020   HCT 40.0 02/03/2020   MCV 83.9 02/03/2020   PLT 299 02/03/2020      Component Value Date/Time   NA 134 (L) 02/03/2020 1608   NA 142 09/18/2019 1605   K 3.7 02/03/2020 1608   CL 103 02/03/2020 1608   CO2 24 02/03/2020 1608   GLUCOSE 106 (H) 02/03/2020 1608   BUN 9 02/03/2020 1608   BUN 7 09/18/2019 1605   CREATININE 1.08 (H) 02/03/2020 1608   CREATININE 0.84 02/07/2016 1719   CALCIUM 9.1 02/03/2020 1608   PROT 7.1 12/30/2020 1515   ALBUMIN 4.2 12/30/2020 1515   AST 11 12/30/2020 1515   ALT 16 12/30/2020 1515   ALKPHOS 83 12/30/2020 1515   BILITOT 0.6 12/30/2020 1515   GFRNONAA >60  02/03/2020 1608   GFRAA >60 02/03/2020 1608   Lab Results  Component Value Date   CHOL 182 12/30/2020   HDL 38 (L) 12/30/2020   LDLCALC 121 (H) 12/30/2020   TRIG 130 12/30/2020   CHOLHDL 4.8 (H) 12/30/2020   Lab Results  Component Value Date   HGBA1C 5.6 12/30/2020   Lab Results  Component Value Date   VITAMINB12 339 02/12/2020   Lab Results  Component Value Date   TSH 0.912 02/12/2020      ASSESSMENT AND PLAN  41 y.o. year old female  has a past medical history of Anxiety, Asthma, Chronic headaches, COVID-19, Gallstones, Gastritis, GERD (gastroesophageal reflux disease), Hyperlipemia, and Kidney stones. here with   No diagnosis found.  Ronette presents today with continued concerns of intermittent head pressure. No clear migrainous symptoms. I will start her on propranolol 20mg  BID. Advised to take 20mg  QHS for 1 week then increase dose to 20mg  BID. Potential side effects and time needed to determine effectiveness discussed. Education provided in AVS.  Healthy lifestyle habits encouraged. Continue close follow up with PCP.  She will follow-up in 6 months. She verbalizes understanding and agreement with this plan.   Shawnie Dapper, MSN, FNP-C 01/03/2022, 8:18 AM  [facility] [address], [address] [place], [place] [address] [phone]

## 2022-01-03 ENCOUNTER — Encounter: Payer: Self-pay | Admitting: Family Medicine

## 2022-01-03 ENCOUNTER — Ambulatory Visit (INDEPENDENT_AMBULATORY_CARE_PROVIDER_SITE_OTHER): Payer: BLUE CROSS/BLUE SHIELD | Admitting: Family Medicine

## 2022-01-03 VITALS — BP 126/78 | HR 89 | Ht 68.0 in | Wt 293.6 lb

## 2022-01-03 DIAGNOSIS — R519 Headache, unspecified: Secondary | ICD-10-CM

## 2022-01-03 MED ORDER — PROPRANOLOL HCL 20 MG PO TABS: 20.0000 mg | ORAL_TABLET | Freq: Two times a day (BID) | ORAL | 1 refills | Status: DC

## 2022-02-22 ENCOUNTER — Encounter (INDEPENDENT_AMBULATORY_CARE_PROVIDER_SITE_OTHER): Payer: Self-pay

## 2022-03-09 ENCOUNTER — Other Ambulatory Visit: Payer: Self-pay | Admitting: Internal Medicine

## 2022-03-09 ENCOUNTER — Encounter: Payer: Self-pay | Admitting: Internal Medicine

## 2022-03-09 ENCOUNTER — Other Ambulatory Visit: Payer: Self-pay

## 2022-03-09 DIAGNOSIS — L210 Seborrhea capitis: Secondary | ICD-10-CM

## 2022-03-12 ENCOUNTER — Other Ambulatory Visit: Payer: Self-pay

## 2022-03-12 DIAGNOSIS — R519 Headache, unspecified: Secondary | ICD-10-CM | POA: Insufficient documentation

## 2022-03-12 DIAGNOSIS — R110 Nausea: Secondary | ICD-10-CM | POA: Diagnosis not present

## 2022-03-12 DIAGNOSIS — L299 Pruritus, unspecified: Secondary | ICD-10-CM | POA: Diagnosis not present

## 2022-03-12 DIAGNOSIS — Z5321 Procedure and treatment not carried out due to patient leaving prior to being seen by health care provider: Secondary | ICD-10-CM | POA: Diagnosis not present

## 2022-03-12 MED ORDER — OXYCODONE-ACETAMINOPHEN 5-325 MG PO TABS
1.0000 | ORAL_TABLET | ORAL | Status: DC | PRN
  Administered 2022-03-12: 1 via ORAL
  Filled 2022-03-12: qty 1

## 2022-03-12 MED ORDER — ONDANSETRON 4 MG PO TBDP
4.0000 mg | ORAL_TABLET | Freq: Once | ORAL | Status: AC | PRN
  Administered 2022-03-12: 4 mg via ORAL
  Filled 2022-03-12: qty 1

## 2022-03-12 NOTE — ED Triage Notes (Signed)
Pt states history of migraine headache and states is having a migraine. Pt states has been present since this am. Pt states she also has had itchy skin since taking propranolol this am. Pt complains of nausea, sensitivity to light and sound, no fever, no rash noted.

## 2022-03-13 ENCOUNTER — Emergency Department
Admission: EM | Admit: 2022-03-13 | Discharge: 2022-03-13 | Discharge status: Against Medical Advice | Payer: BLUE CROSS/BLUE SHIELD | Attending: Emergency Medicine | Admitting: Emergency Medicine

## 2022-03-15 ENCOUNTER — Telehealth (HOSPITAL_BASED_OUTPATIENT_CLINIC_OR_DEPARTMENT_OTHER): Payer: BLUE CROSS/BLUE SHIELD | Admitting: Internal Medicine

## 2022-03-15 DIAGNOSIS — L219 Seborrheic dermatitis, unspecified: Secondary | ICD-10-CM | POA: Diagnosis not present

## 2022-03-15 MED ORDER — HYDROCORTISONE BUTYRATE 0.1 % EX SOLN: CUTANEOUS | 0 refills | Status: DC

## 2022-03-15 MED ORDER — KETOCONAZOLE 2 % EX SHAM: 1.0000 | MEDICATED_SHAMPOO | CUTANEOUS | 0 refills | Status: DC

## 2022-03-15 NOTE — Progress Notes (Signed)
Virtual Visit via Video Note  I connected with Amy Mendoza on 03/15/2022 at 12:04 PM by a video enabled telemedicine application and verified that I am speaking with the correct person using two identifiers.  Location: Patient: work Provider: Office   I discussed the limitations of evaluation and management by telemedicine and the availability of in person appointments. The patient expressed understanding and agreed to proceed.  History of Present Illness: Patient with history of COVID-19 infection 06/2019, asthma, HL, tension HA, ? TMJ,  anxiety, obesity, GERD. This is an urgent care visit.  Patient complains of scaly patchy dermatitis around the anterior hairline and extends to and include the ears and skin behind the ears.  Flared up about 2 weeks ago.  Had similar flare after she had COVID.  The rash does not itch.  It does not extend into the hair as dandruff.  However she has been using over-the-counter dandruff shampoo in the form of T-Gel which has dried her hair out.  She also tried using coconut oil and tea tree oil.  She also started using some Lamisil cream which seem to help a little.  She had sent me a MyChart message several days ago with pictures.  She also requested referral to dermatology.  The referral was submitted.  She had requested prescription for Diflucan to see if that would help.  Outpatient Encounter Medications as of 03/15/2022  Medication Sig   fluticasone (FLONASE) 50 MCG/ACT nasal spray PLACE 1 SPRAY INTO BOTH NOSTRILS DAILY AS NEEDED FOR ALLERGIES OR RHINITIS.   gabapentin (NEURONTIN) 100 MG capsule Take 100 mg by mouth 3 (three) times daily.   oxyCODONE-acetaminophen (PERCOCET) 10-325 MG tablet Take 1 tablet by mouth every 8 (eight) hours as needed for pain (Fill on or after2/11/2020).   propranolol (INDERAL) 20 MG tablet Take 1 tablet (20 mg total) by mouth 2 (two) times daily.   No facility-administered encounter medications on file as of 03/15/2022.       Observations/Objective: Patient is a middle-age African-American female.  She is standing outside.  She is in no acute distress.     Assessment and Plan: 1. Seborrheic dermatitis We will give a trial of Nizoral shampoo.  Advised to use twice a week and only on the areas around the hairline and the ears.  She does not have to wash her hair in it.  Advised to lather with it and leave on for 10 minutes then rinse off.  We will also steroid solution to use 2-3 times a week. - ketoconazole (NIZORAL) 2 % shampoo; Apply 1 Application topically 2 (two) times a week.  Dispense: 120 mL; Refill: 0 - Hydrocortisone Butyrate 0.1 % SOLN; Apply dime size amounts to affected areas 2-3 times a week.  Do not put on face  Dispense: 60 mL; Refill: 0   Follow Up Instructions: PRN   I discussed the assessment and treatment plan with the patient. The patient was provided an opportunity to ask questions and all were answered. The patient agreed with the plan and demonstrated an understanding of the instructions.   The patient was advised to call back or seek an in-person evaluation if the symptoms worsen or if the condition fails to improve as anticipated.  I spent 6 minutes dedicated to the care of this patient on the date of this encounter to include previsit review of of MyChart message, face-to-face time with patient discussing diagnosis and management and postvisit ordering of medications.  This note has been created  with Education officer, environmental. Any transcriptional errors are unintentional.  Jonah Blue, MD

## 2022-04-11 ENCOUNTER — Emergency Department
Admission: EM | Admit: 2022-04-11 | Discharge: 2022-04-12 | Discharge status: Against Medical Advice | Payer: BLUE CROSS/BLUE SHIELD | Attending: Emergency Medicine | Admitting: Emergency Medicine

## 2022-04-11 ENCOUNTER — Other Ambulatory Visit: Payer: Self-pay

## 2022-04-11 ENCOUNTER — Encounter: Payer: Self-pay | Admitting: *Deleted

## 2022-04-11 DIAGNOSIS — R110 Nausea: Secondary | ICD-10-CM | POA: Insufficient documentation

## 2022-04-11 DIAGNOSIS — R1030 Lower abdominal pain, unspecified: Secondary | ICD-10-CM | POA: Insufficient documentation

## 2022-04-11 DIAGNOSIS — Z5321 Procedure and treatment not carried out due to patient leaving prior to being seen by health care provider: Secondary | ICD-10-CM | POA: Diagnosis not present

## 2022-04-11 DIAGNOSIS — R059 Cough, unspecified: Secondary | ICD-10-CM | POA: Diagnosis not present

## 2022-04-11 LAB — CBC
HCT: 44.8 % (ref 36.0–46.0)
Hemoglobin: 14.2 g/dL (ref 12.0–15.0)
MCH: 27.2 pg (ref 26.0–34.0)
MCHC: 31.7 g/dL (ref 30.0–36.0)
MCV: 85.7 fL (ref 80.0–100.0)
Platelets: 269 10*3/uL (ref 150–400)
RBC: 5.23 MIL/uL — ABNORMAL HIGH (ref 3.87–5.11)
RDW: 12.3 % (ref 11.5–15.5)
WBC: 7 10*3/uL (ref 4.0–10.5)
nRBC: 0 % (ref 0.0–0.2)

## 2022-04-11 LAB — COMPREHENSIVE METABOLIC PANEL
ALT: 19 U/L (ref 0–44)
AST: 20 U/L (ref 15–41)
Albumin: 4.1 g/dL (ref 3.5–5.0)
Alkaline Phosphatase: 70 U/L (ref 38–126)
Anion gap: 7 (ref 5–15)
BUN: 7 mg/dL (ref 6–20)
CO2: 27 mmol/L (ref 22–32)
Calcium: 9.3 mg/dL (ref 8.9–10.3)
Chloride: 104 mmol/L (ref 98–111)
Creatinine, Ser: 0.77 mg/dL (ref 0.44–1.00)
GFR, Estimated: >60 mL/min (ref >60)
Glucose, Bld: 91 mg/dL (ref 70–99)
Potassium: 4.3 mmol/L (ref 3.5–5.1)
Sodium: 138 mmol/L (ref 135–145)
Total Bilirubin: 1.1 mg/dL (ref 0.3–1.2)
Total Protein: 8.3 g/dL — ABNORMAL HIGH (ref 6.5–8.1)

## 2022-04-11 LAB — URINALYSIS, ROUTINE W REFLEX MICROSCOPIC
Bilirubin Urine: NEGATIVE
Glucose, UA: NEGATIVE mg/dL
Hgb urine dipstick: NEGATIVE
Ketones, ur: NEGATIVE mg/dL
Leukocytes,Ua: NEGATIVE
Nitrite: NEGATIVE
Protein, ur: NEGATIVE mg/dL
Specific Gravity, Urine: 1.005 (ref 1.005–1.030)
pH: 6 (ref 5.0–8.0)

## 2022-04-11 LAB — LIPASE, BLOOD: Lipase: 30 U/L (ref 11–51)

## 2022-04-11 NOTE — ED Triage Notes (Addendum)
Pt has low abd pain for 2 weeks.  Pain worse for past 3 days.  Pt has nausea.   No vag bleeding or discharge.   No urinary sx.  No diarrhea or vomiting.  Pt alert  speech clear.    Pt has a cough in triage

## 2022-04-17 ENCOUNTER — Encounter: Payer: Self-pay | Admitting: Internal Medicine

## 2022-04-17 ENCOUNTER — Telehealth: Payer: Self-pay | Admitting: Internal Medicine

## 2022-04-17 NOTE — Telephone Encounter (Signed)
Letter written for patient to return to work post-COVID.

## 2022-05-17 HISTORY — PX: BARIATRIC SURGERY: SHX1103

## 2022-06-19 ENCOUNTER — Other Ambulatory Visit: Payer: Self-pay | Admitting: Internal Medicine

## 2022-06-19 ENCOUNTER — Encounter: Payer: Self-pay | Admitting: Internal Medicine

## 2022-06-19 DIAGNOSIS — L219 Seborrheic dermatitis, unspecified: Secondary | ICD-10-CM

## 2022-06-19 MED ORDER — KETOCONAZOLE 2 % EX SHAM: 1.0000 | MEDICATED_SHAMPOO | CUTANEOUS | 2 refills | Status: DC

## 2022-07-03 NOTE — Progress Notes (Deleted)
No chief complaint on file.   HISTORY OF PRESENT ILLNESS:  07/05/2022 ALL: Amy Mendoza returns for follow up for headaches. We added propranolol 20mg  BID at last visit 12/2021. Since,   01/03/2022 ALL:  Amy Mendoza returns for follow up for headaches. She was last seen 05/2020 and reported chronic tension headaches with associated vocalizations. Neuro workup was unremarkable. She was encouraged to continue follow up with psychiatry and consider starting paroxetine as recommended. Since, she reports headaches have continued. She reports headaches occur, on average, 16 days a month (4 per week). Some headaches are bifrontal. Some are right sided. She describes pain as a pressure sensation. She denies light or sound sensitivity. She does have nausea at times. She has not noted any correlation of headache intensity with activity or rest. No obvious triggers. She reports BP is usually 120's/80s. No significant history of asthma. She has seasonal allergies. History of kidney stones, last in 2019. No ringing in ears. She was advised by PCP to get sleep study. Has deviated septum and seeing ENT.   She did start paroxetine but discontinued as she didn't like how she felt. She was started on hydroxyzine that seems to help anxiety. She has not seen them recently. She has been followed by PCP. She broke her left arm and has had three surgeries since. She is taking gabapentin 100mg  TID. Oxy only as needed.   05/25/2020 ALL:  Amy Mendoza is a 41 y.o. female here today for follow up for chronic episodic headaches. 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 We started tizanidine. She reports that she never got this medication.   She reports that head pressure and vocalization concerns continue. She feels symptoms wax and wane. Head pressure feels that it tightens up and then releases when she yells out. She reports vocalization occurs  with any type of pain. She shares a video with me int he office today where she was having head pain. She has random episodes where she will yell out or groan. She is seeing psychiatry. She was advised to take Paxil but has been scared to start it. She continues to have abdominal muscle spasms. She feels that she has trouble with short term memory loss. She has decreased motivation. She feels that she is gaining weight. She feels hungry but then feels that she doesn't have an appetite. She gets anxious about pain coming on if she exercises. She has chest pains when she is anxious. She has started B12 for low normal levels that she thought could be causing bilateral leg pain. EMG/NCS normal. She was diagnosed with Covid in 06/2019 and concerned these symptoms are related.    HISTORY (copied from my note on 01/21/2020)  Amy Mendoza is a 41 y.o. female here today for follow up. She reports that twitching has improved significantly. She reports a "vocalization" when she has a headache or stomach pains. She describes a moan or groan that "comes out different with one pain and a different way with the other". She has not had any migrainous headaches. She describes a tension type pain. Pain is bilateral in the cheek and jaw area. She reports significant jaw pain after work. 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 She has taken OTC allergy medications that have helped. She is seeing GI for stomach aches. She was started on Bentyl but not sure if  it is helping. She has an appt with psychiatry this Saturday. She does not feel symptoms are related to anxiety.    HISTORY: (copied from Dr Lucia Gaskins note on 10/21/2019)    HPI:  Amy Mendoza is a 41 y.o. female here as requested by Marcine Matar, MD for tic disorder. PMHx anxiety, obesity. Reviewed Dr. Henriette Combs notes: she developed body twitching in early January, the twitching comes "in waves" some days no twitching at all, she stopped  drinking caffeine. She was started on zoloft. I reviewed ED notes: She presented to the ED 3/9 for persistent tics and anxiety disorder, tics controlled with cognitive behavioral therapy and breathing exercises and they thought she would benefit from behavioral therapy. She also presented 3/24 for migraine to the ER and then again on 3/29. She was in the ED 3/31 as well for migraine, facial pressure, chest pains, grunting and clearing her throat during exam which she describes as "fits". She then presented to the ER again 10/19/19 for facial pressure and pain, taking ketorolac and reglan, and also stated that many symptoms likely due to anxiety, she had maxillary tenderness and dxed with a sinus headache and told to use flonase, antihistamines and nasal rinses. She has also visited the ED at [facility] as well on 3/30 with similar headache symptoms, diagnosed with acute tension-type headache and motor and vocal tics, exam was normal per notes. She has also been evaluated by cardiology for palpitations in the past per review of records as well.    She is here alone. We discussed due to limited time we will focus on migraines today, I will address tic disorder briefly but patient should really be seen in psychiatry for this disorder as they can provide cognitive behavioral therapy or other therapy and can also treat with medication. She has a hx of migraines around menses for many years episodic but after covid started having significant headaches and pressure in the head and face, she had Covid in [place] and was hospitalized, she was home on Dec 22nd, the following week she was feeling better but still with significant fatigue. January 8th she went to the emergency room, she had chest pain, felt like she was dying, they thought she was having an anxiety atack she had multiple visits for this. In February headaches worsened, has progressively gotten worse and she went to the ED many times for headache as well. She feels  like electric shocks in the head, pressure in the head severe, worst headaches of life and "brain zaps". Sensory changes left side of the face, light and sound sensitivity. Working made it worse. Headaches/migraines can last days, going into a dark room helps, pulsating/pounding/throbbing, pressure in the face and jaw, positional worse with bending over, blurry vision. She is having the headaches every day. Shooting pain through the nose. Headaches are throbbing and all over, can be unilateral in the temples also in the back of the head. Never been on migraine medications in the past. She says her tics are in arms and shoulders, feels like it does it on its own, jerking movements, she makes vocalizations.    Reviewed notes, labs and imaging from outside physicians, which showed: see above, cbc and cmp unremarkable 10/13/2019   REVIEW OF SYSTEMS: Out of a complete 14 system review of symptoms, the patient complains only of the following symptoms, intermittent head pressure, vocalizations, anxiety, and all other reviewed systems are negative.   ALLERGIES: Allergies  Allergen Reactions   Zoloft [Sertraline]  Pt states it made her crazy/worsened anxiety.      HOME MEDICATIONS: Outpatient Medications Prior to Visit  Medication Sig Dispense Refill   fluticasone (FLONASE) 50 MCG/ACT nasal spray PLACE 1 SPRAY INTO BOTH NOSTRILS DAILY AS NEEDED FOR ALLERGIES OR RHINITIS. 48 mL 3   gabapentin (NEURONTIN) 100 MG capsule Take 100 mg by mouth 3 (three) times daily.     Hydrocortisone Butyrate 0.1 % SOLN Apply dime size amounts to affected areas 2-3 times a week.  Do not put on face 60 mL 0   ketoconazole (NIZORAL) 2 % shampoo Apply 1 Application topically 2 (two) times a week. 120 mL 2   oxyCODONE-acetaminophen (PERCOCET) 10-325 MG tablet Take 1 tablet by mouth every 8 (eight) hours as needed for pain (Fill on or after2/11/2020). 25 tablet 0   propranolol (INDERAL) 20 MG tablet Take 1 tablet (20 mg  total) by mouth 2 (two) times daily. 180 tablet 1   No facility-administered medications prior to visit.     PAST MEDICAL HISTORY: Past Medical History:  Diagnosis Date   Anxiety    Asthma    Chronic headaches    COVID-19    Gallstones    Gastritis    GERD (gastroesophageal reflux disease)    Hyperlipemia    no meds   Kidney stones      PAST SURGICAL HISTORY: Past Surgical History:  Procedure Laterality Date   CHOLECYSTECTOMY  feb 2006   COLONOSCOPY     KNEE SURGERY Left      FAMILY HISTORY: Family History  Problem Relation Age of Onset   Diabetes Father    Heart failure Father    Heart attack Father    Prostate cancer Maternal Grandfather    Pancreatic cancer Maternal Grandfather    Colon cancer Maternal Grandfather    Breast cancer Maternal Aunt    Ovarian cancer Mother    Diabetes Maternal Grandmother    Kidney Stones Maternal Grandmother    Heart attack Paternal Grandfather    Heart attack Paternal Aunt    Diabetes Maternal Uncle    Asthma Son    Tics Neg Hx    Migraines Neg Hx    Esophageal cancer Neg Hx    Rectal cancer Neg Hx    Stomach cancer Neg Hx      SOCIAL HISTORY: Social History   Socioeconomic History   Marital status: Single    Spouse name: Not on file   Number of children: 1   Years of education: Not on file   Highest education level: Associate degree: occupational, Scientist, product/process developmenttechnical, or vocational program  Occupational History   Occupation: LPN  Tobacco Use   Smoking status: Never   Smokeless tobacco: Never  Vaping Use   Vaping Use: Former  Substance and Sexual Activity   Alcohol use: Not Currently    Comment: occasionally-social   Drug use: Not Currently    Types: Marijuana    Comment: tried marijuana once in an edible but it made her freak out.    Sexual activity: Yes    Birth control/protection: I.U.D.  Other Topics Concern   Not on file  Social History Narrative   [place]ives now in [place] and [place]LPN[place] in [place] home [place]   Right handed   Caffeine: none    Social Determinants of Health   Financial Resource Strain: Not on file  Food Insecurity: Not on file  Transportation Needs: Not on file  Physical Activity: Not on file  Stress: Not on file  Social Connections: Not on file  Intimate Partner Violence: Not on file      PHYSICAL EXAM  There were no vitals filed for this visit.   There is no height or weight on file to calculate BMI.   Generalized: Well developed, in no acute distress   Cardiology: Normal rate and rhythm, no murmur auscultated Respiratory: Clear to auscultation bilaterally  Neurological examination  Mentation: Alert oriented to time, place, history taking. Follows all commands speech and language fluent Cranial nerve II-XII: Pupils were equal round reactive to light. Extraocular movements were full, visual field were full  Motor: The motor testing reveals 5 over 5 strength of all 4 extremities. Good symmetric motor tone is noted throughout.  Gait and station: Gait is normal.     DIAGNOSTIC DATA (LABS, IMAGING, TESTING) - I reviewed patient records, labs, notes, testing and imaging myself where available.  Lab Results  Component Value Date   WBC 7.0 04/11/2022   HGB 14.2 04/11/2022   HCT 44.8 04/11/2022   MCV 85.7 04/11/2022   PLT 269 04/11/2022      Component Value Date/Time   NA 138 04/11/2022 1855   NA 142 09/18/2019 1605   K 4.3 04/11/2022 1855   CL 104 04/11/2022 1855   CO2 27 04/11/2022 1855   GLUCOSE 91 04/11/2022 1855   BUN 7 04/11/2022 1855   BUN 7 09/18/2019 1605   CREATININE 0.77 04/11/2022 1855   CREATININE 0.84 02/07/2016 1719   CALCIUM 9.3 04/11/2022 1855   PROT 8.3 (H) 04/11/2022 1855   PROT 7.1 12/30/2020 1515   ALBUMIN 4.1 04/11/2022 1855   ALBUMIN 4.2 12/30/2020 1515   AST 20 04/11/2022 1855   ALT 19 04/11/2022 1855   ALKPHOS 70 04/11/2022 1855   BILITOT 1.1 04/11/2022 1855   BILITOT 0.6 12/30/2020 1515   GFRNONAA >60  04/11/2022 1855   GFRAA >60 02/03/2020 1608   Lab Results  Component Value Date   CHOL 182 12/30/2020   HDL 38 (L) 12/30/2020   LDLCALC 121 (H) 12/30/2020   TRIG 130 12/30/2020   CHOLHDL 4.8 (H) 12/30/2020   Lab Results  Component Value Date   HGBA1C 5.6 12/30/2020   Lab Results  Component Value Date   VITAMINB12 339 02/12/2020   Lab Results  Component Value Date   TSH 0.912 02/12/2020      ASSESSMENT AND PLAN  41 y.o. year old female  has a past medical history of Anxiety, Asthma, Chronic headaches, COVID-19, Gallstones, Gastritis, GERD (gastroesophageal reflux disease), Hyperlipemia, and Kidney stones. here with   No diagnosis found.  Biance presents today with continued concerns of intermittent head pressure. No clear migrainous symptoms. I will start her on propranolol 20mg  BID. Advised to take 20mg  QHS for 1 week then increase dose to 20mg  BID. Potential side effects and time needed to determine effectiveness discussed. Education provided in AVS.  Healthy lifestyle habits encouraged. Continue close follow up with PCP.  She will follow-up in 6 months. She verbalizes understanding and agreement with this plan.   , MSN, FNP-C 07/03/2022, 1:33 PM  [facility] [address] [place], [place] [address] [phone]

## 2022-07-05 ENCOUNTER — Encounter: Payer: Self-pay | Admitting: Family Medicine

## 2022-07-05 ENCOUNTER — Ambulatory Visit: Payer: BLUE CROSS/BLUE SHIELD | Admitting: Family Medicine

## 2022-07-05 DIAGNOSIS — R519 Headache, unspecified: Secondary | ICD-10-CM

## 2022-07-10 ENCOUNTER — Encounter: Payer: Self-pay | Admitting: Internal Medicine

## 2022-07-26 ENCOUNTER — Ambulatory Visit: Payer: Self-pay

## 2022-07-26 ENCOUNTER — Telehealth: Payer: BLUE CROSS/BLUE SHIELD | Admitting: Physician Assistant

## 2022-07-26 DIAGNOSIS — J019 Acute sinusitis, unspecified: Secondary | ICD-10-CM | POA: Diagnosis not present

## 2022-07-26 DIAGNOSIS — B9689 Other specified bacterial agents as the cause of diseases classified elsewhere: Secondary | ICD-10-CM

## 2022-07-26 MED ORDER — PROMETHAZINE-DM 6.25-15 MG/5ML PO SYRP: 5.0000 mL | ORAL_SOLUTION | Freq: Four times a day (QID) | ORAL | 0 refills | Status: DC | PRN

## 2022-07-26 MED ORDER — AMOXICILLIN-POT CLAVULANATE 875-125 MG PO TABS: 1.0000 | ORAL_TABLET | Freq: Two times a day (BID) | ORAL | 0 refills | Status: DC

## 2022-07-26 NOTE — Telephone Encounter (Signed)
  Chief Complaint: facial pain to the right side of face and head or scalp pain Symptoms: sinus congestion, cough Frequency: head pain x 2 weeks face pain 1 week Pertinent Negatives: Patient denies fever, SOB,  Disposition: [] ED /[] Urgent Care (no appt availability in office) / [x] Appointment(In office/virtual)/ [x]  [facility]/ [] Home Care/ [] Refused Recommended Disposition /[] [facility]/ []  Follow-up with PCP Additional Notes: advised to call neurologist if scalp pain did not improve. Advised saline nasal spray and the take Flonase.  Reason for Disposition  [1] MODERATE pain (e.g., interferes with normal activities) AND [2] constant AND [3] present > 24 hours  Answer Assessment - Initial Assessment Questions 1. ONSET: "When did the pain start?" (e.g., minutes, hours, days)     1 week ago 2. ONSET: "Does the pain come and go, or has it been constant since it started?" (e.g., constant, intermittent, fleeting)     Facial comes and goes- scalp constant to pressure and when lay on pillow 3. SEVERITY: "How bad is the pain?"   (Scale 1-10; mild, moderate or severe)   - MILD (1-3): doesn't interfere with normal activities    - MODERATE (4-7): interferes with normal activities or awakens from sleep    - SEVERE (8-10): excruciating pain, unable to do any normal activities      Face: 8 not as bad in am                     headache: 4. LOCATION: "Where does it hurt?"      Right side of face 5. RASH: "Is there any redness, rash, or swelling of the face?"     no 6. FEVER: "Do you have a fever?" If Yes, ask: "What is it, how was it measured, and when did it start?"      no 7. OTHER SYMPTOMS: "Do you have any other symptoms?" (e.g., fever, toothache, nasal discharge, nasal congestion, clicking sensation in jaw joint)     Head pain x 2 weeks, dry hacking cough- sounding more congestion, feels right side of nose congested 8. PREGNANCY: "Is there any chance you are pregnant?" "When  was your last menstrual period?"     N/a  Protocols used: Face Pain-A-AH

## 2022-07-26 NOTE — Patient Instructions (Signed)
[name], thank you for joining Margaretann Loveless, PA-C for today's virtual visit.  While this provider is not your primary care provider (PCP), if your PCP is located in our provider database this encounter information will be shared with them immediately following your visit.   A [facility] MyChart account gives you access to today's visit and all your visits, tests, and labs performed at [facility] " click here if you don't have a [facility] MyChart account or go to [email or web address]  Consent: (Patient) [name] provided verbal consent for this virtual visit at the beginning of the encounter.  Current Medications:  Current Outpatient Medications:    amoxicillin-clavulanate (AUGMENTIN) 875-125 MG tablet, Take 1 tablet by mouth 2 (two) times daily., Disp: 20 tablet, Rfl: 0   promethazine-dextromethorphan (PROMETHAZINE-DM) 6.25-15 MG/5ML syrup, Take 5 mLs by mouth 4 (four) times daily as needed., Disp: 118 mL, Rfl: 0   fluticasone (FLONASE) 50 MCG/ACT nasal spray, PLACE 1 SPRAY INTO BOTH NOSTRILS DAILY AS NEEDED FOR ALLERGIES OR RHINITIS., Disp: 48 mL, Rfl: 3   gabapentin (NEURONTIN) 100 MG capsule, Take 100 mg by mouth 3 (three) times daily., Disp: , Rfl:    Hydrocortisone Butyrate 0.1 % SOLN, Apply dime size amounts to affected areas 2-3 times a week.  Do not put on face, Disp: 60 mL, Rfl: 0   ketoconazole (NIZORAL) 2 % shampoo, Apply 1 Application topically 2 (two) times a week., Disp: 120 mL, Rfl: 2   oxyCODONE-acetaminophen (PERCOCET) 10-325 MG tablet, Take 1 tablet by mouth every 8 (eight) hours as needed for pain (Fill on or after2/11/2020)., Disp: 25 tablet, Rfl: 0   propranolol (INDERAL) 20 MG tablet, Take 1 tablet (20 mg total) by mouth 2 (two) times daily., Disp: 180 tablet, Rfl: 1   Medications ordered in this encounter:  Meds ordered this encounter  Medications   amoxicillin-clavulanate (AUGMENTIN) 875-125 MG tablet    Sig: Take 1 tablet  by mouth 2 (two) times daily.    Dispense:  20 tablet    Refill:  0    Order Specific Question:   Supervising Provider    Answer:   Merrilee Jansky [id number]   promethazine-dextromethorphan (PROMETHAZINE-DM) 6.25-15 MG/5ML syrup    Sig: Take 5 mLs by mouth 4 (four) times daily as needed.    Dispense:  118 mL    Refill:  0    Order Specific Question:   Supervising Provider    Answer:   Merrilee Jansky [[id number]]     *If you need refills on other medications prior to your next appointment, please contact your pharmacy*  Follow-Up: Call back or seek an in-person evaluation if the symptoms worsen or if the condition fails to improve as anticipated.  [facility] [phone]  Other Instructions Sinus Infection, Adult A sinus infection, also called sinusitis, is inflammation of your sinuses. Sinuses are hollow spaces in the bones around your face. Your sinuses are located: Around your eyes. In the middle of your forehead. Behind your nose. In your cheekbones. Mucus normally drains out of your sinuses. When your nasal tissues become inflamed or swollen, mucus can become trapped or blocked. This allows bacteria, viruses, and fungi to grow, which leads to infection. Most infections of the sinuses are caused by a virus. A sinus infection can develop quickly. It can last for up to 4 weeks (acute) or for more than 12 weeks (chronic). A sinus infection often develops after a cold. What are  the causes? This condition is caused by anything that creates swelling in the sinuses or stops mucus from draining. This includes: Allergies. Asthma. Infection from bacteria or viruses. Deformities or blockages in your nose or sinuses. Abnormal growths in the nose (nasal polyps). Pollutants, such as chemicals or irritants in the air. Infection from fungi. This is rare. What increases the risk? You are more likely to develop this condition if you: Have a weak body defense system (immune  system). Do a lot of swimming or diving. Overuse nasal sprays. Smoke. What are the signs or symptoms? The main symptoms of this condition are pain and a feeling of pressure around the affected sinuses. Other symptoms include: Stuffy nose or congestion that makes it difficult to breathe through your nose. Thick yellow or greenish drainage from your nose. Tenderness, swelling, and warmth over the affected sinuses. A cough that may get worse at night. Decreased sense of smell and taste. Extra mucus that collects in the throat or the back of the nose (postnasal drip) causing a sore throat or bad breath. Tiredness (fatigue). Fever. How is this diagnosed? This condition is diagnosed based on: Your symptoms. Your medical history. A physical exam. Tests to find out if your condition is acute or chronic. This may include: Checking your nose for nasal polyps. Viewing your sinuses using a device that has a light (endoscope). Testing for allergies or bacteria. Imaging tests, such as an MRI or CT scan. In rare cases, a bone biopsy may be done to rule out more serious types of fungal sinus disease. How is this treated? Treatment for a sinus infection depends on the cause and whether your condition is chronic or acute. If caused by a virus, your symptoms should go away on their own within 10 days. You may be given medicines to relieve symptoms. They include: Medicines that shrink swollen nasal passages (decongestants). A spray that eases inflammation of the nostrils (topical intranasal corticosteroids). Rinses that help get rid of thick mucus in your nose (nasal saline washes). Medicines that treat allergies (antihistamines). Over-the-counter pain relievers. If caused by bacteria, your health care provider may recommend waiting to see if your symptoms improve. Most bacterial infections will get better without antibiotic medicine. You may be given antibiotics if you have: A severe infection. A  weak immune system. If caused by narrow nasal passages or nasal polyps, surgery may be needed. Follow these instructions at home: Medicines Take, use, or apply over-the-counter and prescription medicines only as told by your health care provider. These may include nasal sprays. If you were prescribed an antibiotic medicine, take it as told by your health care provider. Do not stop taking the antibiotic even if you start to feel better. Hydrate and humidify  Drink enough fluid to keep your urine pale yellow. Staying hydrated will help to thin your mucus. Use a cool mist humidifier to keep the humidity level in your home above 50%. Inhale steam for 10-15 minutes, 3-4 times a day, or as told by your health care provider. You can do this in the bathroom while a hot shower is running. Limit your exposure to cool or dry air. Rest Rest as much as possible. Sleep with your head raised (elevated). Make sure you get enough sleep each night. General instructions  Apply a warm, moist washcloth to your face 3-4 times a day or as told by your health care provider. This will help with discomfort. Use nasal saline washes as often as told by your health care  provider. Wash your hands often with soap and water to reduce your exposure to germs. If soap and water are not available, use hand sanitizer. Do not smoke. Avoid being around people who are smoking (secondhand smoke). Keep all follow-up visits. This is important. Contact a health care provider if: You have a fever. Your symptoms get worse. Your symptoms do not improve within 10 days. Get help right away if: You have a severe headache. You have persistent vomiting. You have severe pain or swelling around your face or eyes. You have vision problems. You develop confusion. Your neck is stiff. You have trouble breathing. These symptoms may be an emergency. Get help right away. Call 911. Do not wait to see if the symptoms will go away. Do not  drive yourself to the hospital. Summary A sinus infection is soreness and inflammation of your sinuses. Sinuses are hollow spaces in the bones around your face. This condition is caused by nasal tissues that become inflamed or swollen. The swelling traps or blocks the flow of mucus. This allows bacteria, viruses, and fungi to grow, which leads to infection. If you were prescribed an antibiotic medicine, take it as told by your health care provider. Do not stop taking the antibiotic even if you start to feel better. Keep all follow-up visits. This is important. This information is not intended to replace advice given to you by your health care provider. Make sure you discuss any questions you have with your health care provider. Document Revised: 06/07/2021 Document Reviewed: 06/07/2021 Elsevier Patient Education  [place].    If you have been instructed to have an in-person evaluation today at a local Urgent Care facility, please use the link below. It will take you to a list of all of our available [facility], including address, phone number and hours of operation. Please do not delay care.  [facility]  If you or a family member do not have a primary care provider, use the link below to schedule a visit and establish care. When you choose a [facility] primary care physician or advanced practice provider, you gain a long-term partner in health. Find a Primary Care Provider  Learn more about 's in-office and virtual care options: [facility]

## 2022-07-26 NOTE — Progress Notes (Signed)
Virtual Visit Consent   Amy Mendoza, you are scheduled for a virtual visit with a [facility] provider today. Just as with appointments in the office, your consent must be obtained to participate. Your consent will be active for this visit and any virtual visit you may have with one of our providers in the next 365 days. If you have a MyChart account, a copy of this consent can be sent to you electronically.  As this is a virtual visit, video technology does not allow for your provider to perform a traditional examination. This may limit your provider's ability to fully assess your condition. If your provider identifies any concerns that need to be evaluated in person or the need to arrange testing (such as labs, EKG, etc.), we will make arrangements to do so. Although advances in technology are sophisticated, we cannot ensure that it will always work on either your end or our end. If the connection with a video visit is poor, the visit may have to be switched to a telephone visit. With either a video or telephone visit, we are not always able to ensure that we have a secure connection.  By engaging in this virtual visit, you consent to the provision of healthcare and authorize for your insurance to be billed (if applicable) for the services provided during this visit. Depending on your insurance coverage, you may receive a charge related to this service.  I need to obtain your verbal consent now. Are you willing to proceed with your visit today? Amy Mendoza has provided verbal consent on 07/26/2022 for a virtual visit (video or telephone). Mar Daring, PA-C  Date: 07/26/2022 5:00 PM  Virtual Visit via Video Note   I, Mar Daring, connected with  Amy Mendoza  ([id number], 1980/09/18) on 07/26/22 at  5:00 PM EST by a video-enabled telemedicine application and verified that I am speaking with the correct person using two identifiers.  Location: Patient: Virtual Visit  Location Patient: Home Provider: Virtual Visit Location Provider: Home Office   I discussed the limitations of evaluation and management by telemedicine and the availability of in person appointments. The patient expressed understanding and agreed to proceed.    History of Present Illness: Amy Mendoza is a 42 y.o. who identifies as a female who was assigned female at birth, and is being seen today for possible sinus infection.  HPI: Sinusitis This is a new problem. The current episode started 1 to 4 weeks ago (2 weeks). The problem has been gradually worsening since onset. There has been no fever. Associated symptoms include congestion, coughing, ear pain (right sided), headaches (right parietal area) and sinus pressure (all on right side). Pertinent negatives include no chills, hoarse voice or sore throat.     Problems:  Patient Active Problem List   Diagnosis Date Noted   Anxiety disorder due to medical condition 01/24/2020   Chronic migraine without aura with status migrainosus, not intractable 10/21/2019   Generalized anxiety disorder 09/18/2019   Tic disorder 09/18/2019   Dry skin dermatitis 09/18/2019   Dry heaves 09/18/2019   Personal history of COVID-19 09/18/2019   Obesity, Class III, BMI 40-49.9 (morbid obesity) ([place]) 07/07/2019   Pneumonia due to COVID-19 virus 07/03/2019   Acute respiratory failure with hypoxia ([place]) 07/03/2019   Mild intermittent asthma 07/03/2019   Routine general medical examination at a health care facility 01/18/2017    Allergies:  Allergies  Allergen Reactions   Zoloft [Sertraline]  Pt states it made her crazy/worsened anxiety.    Medications:  Current Outpatient Medications:    amoxicillin-clavulanate (AUGMENTIN) 875-125 MG tablet, Take 1 tablet by mouth 2 (two) times daily., Disp: 20 tablet, Rfl: 0   promethazine-dextromethorphan (PROMETHAZINE-DM) 6.25-15 MG/5ML syrup, Take 5 mLs by mouth 4 (four) times daily as needed., Disp: 118 mL,  Rfl: 0   fluticasone (FLONASE) 50 MCG/ACT nasal spray, PLACE 1 SPRAY INTO BOTH NOSTRILS DAILY AS NEEDED FOR ALLERGIES OR RHINITIS., Disp: 48 mL, Rfl: 3   gabapentin (NEURONTIN) 100 MG capsule, Take 100 mg by mouth 3 (three) times daily., Disp: , Rfl:    Hydrocortisone Butyrate 0.1 % SOLN, Apply dime size amounts to affected areas 2-3 times a week.  Do not put on face, Disp: 60 mL, Rfl: 0   ketoconazole (NIZORAL) 2 % shampoo, Apply 1 Application topically 2 (two) times a week., Disp: 120 mL, Rfl: 2   oxyCODONE-acetaminophen (PERCOCET) 10-325 MG tablet, Take 1 tablet by mouth every 8 (eight) hours as needed for pain (Fill on or after2/11/2020)., Disp: 25 tablet, Rfl: 0   propranolol (INDERAL) 20 MG tablet, Take 1 tablet (20 mg total) by mouth 2 (two) times daily., Disp: 180 tablet, Rfl: 1  Observations/Objective: Patient is well-developed, well-nourished in no acute distress.  Resting comfortably at home.  Head is normocephalic, atraumatic.  No labored breathing.  Speech is clear and coherent with logical content.  Patient is alert and oriented at baseline.    Assessment and Plan: 1. Acute bacterial sinusitis - amoxicillin-clavulanate (AUGMENTIN) 875-125 MG tablet; Take 1 tablet by mouth 2 (two) times daily.  Dispense: 20 tablet; Refill: 0 - promethazine-dextromethorphan (PROMETHAZINE-DM) 6.25-15 MG/5ML syrup; Take 5 mLs by mouth 4 (four) times daily as needed.  Dispense: 118 mL; Refill: 0  - Worsening symptoms that have not responded to OTC medications.  - Will give Augmentin - Promethazine DM for coughing - Steam and humidifier can help - Stay well hydrated and get plenty of rest.  - Seek in person evaluation if no symptom improvement or if symptoms worsen   Follow Up Instructions: I discussed the assessment and treatment plan with the patient. The patient was provided an opportunity to ask questions and all were answered. The patient agreed with the plan and demonstrated an  understanding of the instructions.  A copy of instructions were sent to the patient via MyChart unless otherwise noted below.     The patient was advised to call back or seek an in-person evaluation if the symptoms worsen or if the condition fails to improve as anticipated.  Time:  I spent 10 minutes with the patient via telehealth technology discussing the above problems/concerns.    Mar Daring, PA-C

## 2022-08-09 ENCOUNTER — Encounter: Payer: Self-pay | Admitting: Family Medicine

## 2022-08-09 ENCOUNTER — Ambulatory Visit (INDEPENDENT_AMBULATORY_CARE_PROVIDER_SITE_OTHER): Payer: BLUE CROSS/BLUE SHIELD | Admitting: Family Medicine

## 2022-08-09 VITALS — BP 114/70 | HR 67 | Ht 68.5 in | Wt 251.0 lb

## 2022-08-09 DIAGNOSIS — G4489 Other headache syndrome: Secondary | ICD-10-CM

## 2022-08-09 DIAGNOSIS — R519 Headache, unspecified: Secondary | ICD-10-CM | POA: Diagnosis not present

## 2022-08-09 MED ORDER — AMITRIPTYLINE HCL 10 MG PO TABS: 10.0000 mg | ORAL_TABLET | Freq: Every day | ORAL | 1 refills | Status: DC

## 2022-08-09 NOTE — Progress Notes (Signed)
Chief Complaint  Patient presents with   Follow-up    Patient is in room 1 here for follow up chronic headaches. Reports headaches are better, has a tender spot in middle in scalp, dull to sharp pain, feels in eyes, thought it was sinus infections was treated with antibiotics. Reports this pain has been going on x 2 months. Reports this pain impacts vision.     HISTORY OF PRESENT ILLNESS:  08/09/2022 ALL:  Amy Mendoza returns for follow up for headaches. She was started on propranolol 20mg  BID at last visit 12/2021. She reports taking one dose and felt as if she was going to pass out. Heart rate was in the 50s. She has not reached out to let me know. She reports headaches continue to wax and wane. She continues to have right sided sinus pressure. She was treated for a sinus infection but symptoms have not improved. She reports having a dull pain in the center of her scalp that is constant. She reports taking oxycodone 10-325 1-2 tablets 3-4 times weekly. PDMP shows refills for 90 tablets every 30 days for the past 3 months. She is drinking 50-60 ounces of water daily. She works nights. She has stopped anxiety medications. She continues to follow up closely with PCP.   01/03/2022 ALL:  Amy Mendoza returns for follow up for headaches. She was last seen 05/2020 and reported chronic tension headaches with associated vocalizations. Neuro workup was unremarkable. She was encouraged to continue follow up with psychiatry and consider starting paroxetine as recommended. Since, she reports headaches have continued. She reports headaches occur, on average, 16 days a month (4 per week). Some headaches are bifrontal. Some are right sided. She describes pain as a pressure sensation. She denies light or sound sensitivity. She does have nausea at times. She has not noted any correlation of headache intensity with activity or rest. No obvious triggers. She reports BP is usually 120's/80s. No significant history of asthma.  She has seasonal allergies. History of kidney stones, last in 2019. No ringing in ears. She was advised by PCP to get sleep study. Has deviated septum and seeing ENT.   She did start paroxetine but discontinued as she didn't like how she felt. She was started on hydroxyzine that seems to help anxiety. She has not seen them recently. She has been followed by PCP. She broke her left arm and has had three surgeries since. She is taking gabapentin 100mg  TID. Oxy only as needed.   05/25/2020 ALL:  ALAISHA Mendoza is a 42 y.o. female here today for follow up for chronic episodic headaches. 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 We started tizanidine. She reports that she never got this medication.   She reports that head pressure and vocalization concerns continue. She feels symptoms wax and wane. Head pressure feels that it tightens up and then releases when she yells out. She reports vocalization occurs with any type of pain. She shares a video with me int he office today where she was having head pain. She has random episodes where she will yell out or groan. She is seeing psychiatry. She was advised to take Paxil but has been scared to start it. She continues to have abdominal muscle spasms. She feels that she has trouble with short term memory loss. She has decreased motivation. She feels that she is gaining weight. She feels hungry but then  feels that she doesn't have an appetite. She gets anxious about pain coming on if she exercises. She has chest pains when she is anxious. She has started B12 for low normal levels that she thought could be causing bilateral leg pain. EMG/NCS normal. She was diagnosed with Covid in 06/2019 and concerned these symptoms are related.    HISTORY (copied from my note on 01/21/2020)  Amy Mendoza is a 42 y.o. female here today for follow up. She reports that twitching has improved  significantly. She reports a "vocalization" when she has a headache or stomach pains. She describes a moan or groan that "comes out different with one pain and a different way with the other". She has not had any migrainous headaches. She describes a tension type pain. Pain is bilateral in the cheek and jaw area. She reports significant jaw pain after work. 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 She has taken OTC allergy medications that have helped. She is seeing GI for stomach aches. She was started on Bentyl but not sure if it is helping. She has an appt with psychiatry this Saturday. She does not feel symptoms are related to anxiety.    HISTORY: (copied from Dr Jaynee Eagles note on 10/21/2019)    HPI:  Amy Mendoza is a 42 y.o. female here as requested by Ladell Pier, MD for tic disorder. PMHx anxiety, obesity. Reviewed Dr. Durenda Age notes: she developed body twitching in early January, the twitching comes "in waves" some days no twitching at all, she stopped drinking caffeine. She was started on zoloft. I reviewed ED notes: She presented to the ED 3/9 for persistent tics and anxiety disorder, tics controlled with cognitive behavioral therapy and breathing exercises and they thought she would benefit from behavioral therapy. She also presented 3/24 for migraine to the ER and then again on 3/29. She was in the ED 3/31 as well for migraine, facial pressure, chest pains, grunting and clearing her throat during exam which she describes as "fits". She then presented to the ER again 10/19/19 for facial pressure and pain, taking ketorolac and reglan, and also stated that many symptoms likely due to anxiety, she had maxillary tenderness and dxed with a sinus headache and told to use flonase, antihistamines and nasal rinses. She has also visited the ED at [facility] as well on 3/30 with similar headache symptoms, diagnosed with acute tension-type headache and motor and vocal tics, exam was normal  per notes. She has also been evaluated by cardiology for palpitations in the past per review of records as well.    She is here alone. We discussed due to limited time we will focus on migraines today, I will address tic disorder briefly but patient should really be seen in psychiatry for this disorder as they can provide cognitive behavioral therapy or other therapy and can also treat with medication. She has a hx of migraines around menses for many years episodic but after covid started having significant headaches and pressure in the head and face, she had Covid in [place] and was hospitalized, she was home on Dec 22nd, the following week she was feeling better but still with significant fatigue. January 8th she went to the emergency room, she had chest pain, felt like she was dying, they thought she was having an anxiety atack she had multiple visits for this. In February headaches worsened, has progressively gotten worse and she went to the ED many times for headache as  well. She feels like electric shocks in the head, pressure in the head severe, worst headaches of life and "brain zaps". Sensory changes left side of the face, light and sound sensitivity. Working made it worse. Headaches/migraines can last days, going into a dark room helps, pulsating/pounding/throbbing, pressure in the face and jaw, positional worse with bending over, blurry vision. She is having the headaches every day. Shooting pain through the nose. Headaches are throbbing and all over, can be unilateral in the temples also in the back of the head. Never been on migraine medications in the past. She says her tics are in arms and shoulders, feels like it does it on its own, jerking movements, she makes vocalizations.    Reviewed notes, labs and imaging from outside physicians, which showed: see above, cbc and cmp unremarkable 10/13/2019   REVIEW OF SYSTEMS: Out of a complete 14 system review of symptoms, the patient complains only of  the following symptoms, intermittent head pressure, vocalizations, anxiety, and all other reviewed systems are negative.   ALLERGIES: Allergies  Allergen Reactions   Zoloft [Sertraline]     Pt states it made her crazy/worsened anxiety.      HOME MEDICATIONS: Outpatient Medications Prior to Visit  Medication Sig Dispense Refill   amoxicillin-clavulanate (AUGMENTIN) 875-125 MG tablet Take 1 tablet by mouth 2 (two) times daily. 20 tablet 0   gabapentin (NEURONTIN) 100 MG capsule Take 100 mg by mouth 3 (three) times daily.     ketoconazole (NIZORAL) 2 % shampoo Apply 1 Application topically 2 (two) times a week. 120 mL 2   oxyCODONE-acetaminophen (PERCOCET) 10-325 MG tablet Take 1 tablet by mouth every 8 (eight) hours as needed for pain (Fill on or after2/11/2020). 25 tablet 0   propranolol (INDERAL) 20 MG tablet Take 1 tablet (20 mg total) by mouth 2 (two) times daily. 180 tablet 1   fluticasone (FLONASE) 50 MCG/ACT nasal spray PLACE 1 SPRAY INTO BOTH NOSTRILS DAILY AS NEEDED FOR ALLERGIES OR RHINITIS. (Patient not taking: Reported on 08/09/2022) 48 mL 3   Hydrocortisone Butyrate 0.1 % SOLN Apply dime size amounts to affected areas 2-3 times a week.  Do not put on face (Patient not taking: Reported on 08/09/2022) 60 mL 0   promethazine-dextromethorphan (PROMETHAZINE-DM) 6.25-15 MG/5ML syrup Take 5 mLs by mouth 4 (four) times daily as needed. (Patient not taking: Reported on 08/09/2022) 118 mL 0   No facility-administered medications prior to visit.     PAST MEDICAL HISTORY: Past Medical History:  Diagnosis Date   Anxiety    Asthma    Chronic headaches    COVID-19    Gallstones    Gastritis    GERD (gastroesophageal reflux disease)    Hyperlipemia    no meds   Kidney stones      PAST SURGICAL HISTORY: Past Surgical History:  Procedure Laterality Date   CHOLECYSTECTOMY  feb 2006   COLONOSCOPY     KNEE SURGERY Left      FAMILY HISTORY: Family History  Problem Relation Age  of Onset   Diabetes Father    Heart failure Father    Heart attack Father    Prostate cancer Maternal Grandfather    Pancreatic cancer Maternal Grandfather    Colon cancer Maternal Grandfather    Breast cancer Maternal Aunt    Ovarian cancer Mother    Diabetes Maternal Grandmother    Kidney Stones Maternal Grandmother    Heart attack Paternal Grandfather    Heart attack Paternal Aunt  Diabetes Maternal Uncle    Asthma Son    Tics Neg Hx    Migraines Neg Hx    Esophageal cancer Neg Hx    Rectal cancer Neg Hx    Stomach cancer Neg Hx      SOCIAL HISTORY: Social History   Socioeconomic History   Marital status: Single    Spouse name: Not on file   Number of children: 1   Years of education: Not on file   Highest education level: Associate degree: occupational, Scientist, product/process development, or vocational program  Occupational History   Occupation: LPN  Tobacco Use   Smoking status: Never   Smokeless tobacco: Never  Vaping Use   Vaping Use: Former  Substance and Sexual Activity   Alcohol use: Not Currently    Comment: occasionally-social   Drug use: Not Currently    Types: Marijuana    Comment: tried marijuana once in an edible but it made her freak out.    Sexual activity: Yes    Birth control/protection: I.U.D.  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   Right handed   Caffeine: none    Social Determinants of Health   Financial Resource Strain: Not on file  Food Insecurity: Not on file  Transportation Needs: Not on file  Physical Activity: Not on file  Stress: Not on file  Social Connections: Not on file  Intimate Partner Violence: Not on file      PHYSICAL EXAM  Vitals:   08/09/22 0807  BP: 114/70  Pulse: 67  Weight: 251 lb (113.9 kg)  Height: 5' 8.5" (1.74 m)    Body mass index is 37.61 kg/m.   Generalized: Well developed, in no acute distress   Cardiology:  Normal rate and rhythm, no murmur auscultated Respiratory: Clear to auscultation bilaterally  Neurological examination  Mentation: Alert oriented to time, place, history taking. Follows all commands speech and language fluent Cranial nerve II-XII: Pupils were equal round reactive to light. Extraocular movements were full, visual field were full  Motor: The motor testing reveals 5 over 5 strength of all 4 extremities. Good symmetric motor tone is noted throughout.  Gait and station: Gait is normal.     DIAGNOSTIC DATA (LABS, IMAGING, TESTING) - I reviewed patient records, labs, notes, testing and imaging myself where available.  Lab Results  Component Value Date   WBC 7.0 04/11/2022   HGB 14.2 04/11/2022   HCT 44.8 04/11/2022   MCV 85.7 04/11/2022   PLT 269 04/11/2022      Component Value Date/Time   NA 138 04/11/2022 1855   NA 142 09/18/2019 1605   K 4.3 04/11/2022 1855   CL 104 04/11/2022 1855   CO2 27 04/11/2022 1855   GLUCOSE 91 04/11/2022 1855   BUN 7 04/11/2022 1855   BUN 7 09/18/2019 1605   CREATININE 0.77 04/11/2022 1855   CREATININE 0.84 02/07/2016 1719   CALCIUM 9.3 04/11/2022 1855   PROT 8.3 (H) 04/11/2022 1855   PROT 7.1 12/30/2020 1515   ALBUMIN 4.1 04/11/2022 1855   ALBUMIN 4.2 12/30/2020 1515   AST 20 04/11/2022 1855   ALT 19 04/11/2022 1855   ALKPHOS 70 04/11/2022 1855   BILITOT 1.1 04/11/2022 1855   BILITOT 0.6 12/30/2020 1515   GFRNONAA >60 04/11/2022 1855   GFRAA >60 02/03/2020 1608   Lab Results  Component Value Date   CHOL 182  12/30/2020   HDL 38 (L) 12/30/2020   LDLCALC 121 (H) 12/30/2020   TRIG 130 12/30/2020   CHOLHDL 4.8 (H) 12/30/2020   Lab Results  Component Value Date   HGBA1C 5.6 12/30/2020   Lab Results  Component Value Date   VITAMINB12 339 02/12/2020   Lab Results  Component Value Date   TSH 0.912 02/12/2020    ASSESSMENT AND PLAN  42 y.o. year old female  has a past medical history of Anxiety, Asthma, Chronic  headaches, COVID-19, Gallstones, Gastritis, GERD (gastroesophageal reflux disease), Hyperlipemia, and Kidney stones. here with   Chronic daily headache  Other headache syndrome  Amy Mendoza presents today with continued concerns of intermittent head pressure. Now having tenderness of scalp with touch. No clear migrainous symptoms. I will ave her try low dose amitriptyline. Advised to take 5mg  QHS for 4-5 days then increase to 10mg  QHS. Potential side effects and time needed to determine effectiveness discussed. Education provided in AVS. I have advised that she try to wean oxycodone as this could be causing rebound headaches. She has an appt with ortho next week to discuss. Healthy lifestyle habits encouraged. Continue close follow up with PCP.  She will follow-up in 6 months. She verbalizes understanding and agreement with this plan.   Debbora Presto, MSN, FNP-C 08/09/2022, 8:48 AM  [facility] [address], [place], [place] [address] [phone]

## 2022-08-09 NOTE — Patient Instructions (Signed)
Below is our plan:  We will tr a low dose of amitriptyline. Take 1/2 tablet at bedtime for 4-5 days then increase to 1 tablet daily at bedtime. If tolerated well, allow for 4-6 weeks to be effective. Please try to reduce oxycodone intake as this can cause rebound headaches.   Please make sure you are staying well hydrated. I recommend 50-60 ounces daily. Well balanced diet and regular exercise encouraged. Consistent sleep schedule with 6-8 hours recommended.   Please continue follow up with care team as directed.   Follow up with me in 6 months   You may receive a survey regarding today's visit. I encourage you to leave honest feed back as I do use this information to improve patient care. Thank you for seeing me today!

## 2022-08-28 ENCOUNTER — Ambulatory Visit: Payer: BLUE CROSS/BLUE SHIELD

## 2022-10-02 ENCOUNTER — Emergency Department (HOSPITAL_COMMUNITY): Payer: BLUE CROSS/BLUE SHIELD

## 2022-10-02 ENCOUNTER — Emergency Department (HOSPITAL_COMMUNITY)
Admission: EM | Admit: 2022-10-02 | Discharge: 2022-10-02 | Disposition: A | Discharge status: Home / Self Care | Payer: BLUE CROSS/BLUE SHIELD | Attending: Emergency Medicine | Admitting: Emergency Medicine

## 2022-10-02 ENCOUNTER — Encounter (HOSPITAL_COMMUNITY): Payer: Self-pay

## 2022-10-02 ENCOUNTER — Other Ambulatory Visit: Payer: Self-pay

## 2022-10-02 DIAGNOSIS — R102 Pelvic and perineal pain: Secondary | ICD-10-CM | POA: Diagnosis not present

## 2022-10-02 DIAGNOSIS — R1032 Left lower quadrant pain: Secondary | ICD-10-CM | POA: Diagnosis present

## 2022-10-02 LAB — URINALYSIS, ROUTINE W REFLEX MICROSCOPIC
Bilirubin Urine: NEGATIVE
Glucose, UA: NEGATIVE mg/dL
Hgb urine dipstick: NEGATIVE
Ketones, ur: NEGATIVE mg/dL
Leukocytes,Ua: NEGATIVE
Nitrite: NEGATIVE
Protein, ur: NEGATIVE mg/dL
Specific Gravity, Urine: 1.01 (ref 1.005–1.030)
pH: 6 (ref 5.0–8.0)

## 2022-10-02 LAB — CBC WITH DIFFERENTIAL/PLATELET
Abs Immature Granulocytes: 0.01 10*3/uL (ref 0.00–0.07)
Basophils Absolute: 0 10*3/uL (ref 0.0–0.1)
Basophils Relative: 1 %
Eosinophils Absolute: 0.5 10*3/uL (ref 0.0–0.5)
Eosinophils Relative: 6 %
HCT: 41.1 % (ref 36.0–46.0)
Hemoglobin: 13.2 g/dL (ref 12.0–15.0)
Immature Granulocytes: 0 %
Lymphocytes Relative: 40 %
Lymphs Abs: 3.1 10*3/uL (ref 0.7–4.0)
MCH: 28 pg (ref 26.0–34.0)
MCHC: 32.1 g/dL (ref 30.0–36.0)
MCV: 87.3 fL (ref 80.0–100.0)
Monocytes Absolute: 0.5 10*3/uL (ref 0.1–1.0)
Monocytes Relative: 7 %
Neutro Abs: 3.7 10*3/uL (ref 1.7–7.7)
Neutrophils Relative %: 46 %
Platelets: 268 10*3/uL (ref 150–400)
RBC: 4.71 MIL/uL (ref 3.87–5.11)
RDW: 12.7 % (ref 11.5–15.5)
WBC: 7.8 10*3/uL (ref 4.0–10.5)
nRBC: 0 % (ref 0.0–0.2)

## 2022-10-02 LAB — COMPREHENSIVE METABOLIC PANEL
ALT: 21 U/L (ref 0–44)
AST: 15 U/L (ref 15–41)
Albumin: 3.6 g/dL (ref 3.5–5.0)
Alkaline Phosphatase: 60 U/L (ref 38–126)
Anion gap: 5 (ref 5–15)
BUN: 11 mg/dL (ref 6–20)
CO2: 26 mmol/L (ref 22–32)
Calcium: 8.6 mg/dL — ABNORMAL LOW (ref 8.9–10.3)
Chloride: 106 mmol/L (ref 98–111)
Creatinine, Ser: 0.75 mg/dL (ref 0.44–1.00)
GFR, Estimated: >60 mL/min (ref >60)
Glucose, Bld: 98 mg/dL (ref 70–99)
Potassium: 3.6 mmol/L (ref 3.5–5.1)
Sodium: 137 mmol/L (ref 135–145)
Total Bilirubin: 0.8 mg/dL (ref 0.3–1.2)
Total Protein: 7.4 g/dL (ref 6.5–8.1)

## 2022-10-02 LAB — LIPASE, BLOOD: Lipase: 41 U/L (ref 11–51)

## 2022-10-02 LAB — I-STAT BETA HCG BLOOD, ED (MC, WL, AP ONLY): I-stat hCG, quantitative: <5 m[IU]/mL (ref <5)

## 2022-10-02 MED ORDER — MORPHINE SULFATE (PF) 4 MG/ML IV SOLN
4.0000 mg | Freq: Once | INTRAVENOUS | Status: AC
  Administered 2022-10-02: 4 mg via INTRAVENOUS
  Filled 2022-10-02: qty 1

## 2022-10-02 MED ORDER — IOHEXOL 300 MG/ML  SOLN
100.0000 mL | Freq: Once | INTRAMUSCULAR | Status: AC | PRN
  Administered 2022-10-02: 100 mL via INTRAVENOUS

## 2022-10-02 NOTE — ED Provider Notes (Signed)
[facility] Provider Note   CSN: [id number] Arrival date & time: 10/02/22  1505     History  Chief Complaint  Patient presents with   Abdominal Pain    KEZIAH BARASCH is a 42 y.o. female with a past medical history significant for PCOS who presents to the ED due to left lower quadrant abdominal pain that radiates to left back and flank region x 1 day.  Previous history of kidney stones.  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  No nausea or vomiting.  Denies fever.  No urinary or vaginal symptoms.  Admits to some vaginal spotting earlier today.  Currently sexually active with 1 partner without concern for STIs.  History obtained from patient and past medical records. No interpreter used during encounter.       Home Medications Prior to Admission medications   Medication Sig Start Date End Date Taking? Authorizing Provider  amitriptyline (ELAVIL) 10 MG tablet Take 1 tablet (10 mg total) by mouth at bedtime. 08/09/22   Lomax, Amy, NP  amoxicillin-clavulanate (AUGMENTIN) 875-125 MG tablet Take 1 tablet by mouth 2 (two) times daily. 07/26/22   Mar Daring, PA-C  fluticasone (FLONASE) 50 MCG/ACT nasal spray PLACE 1 SPRAY INTO BOTH NOSTRILS DAILY AS NEEDED FOR ALLERGIES OR RHINITIS. Patient not taking: Reported on 08/09/2022 05/29/21   Ladell Pier, MD  gabapentin (NEURONTIN) 100 MG capsule Take 100 mg by mouth 3 (three) times daily.    [provider]  ketoconazole (NIZORAL) 2 % shampoo Apply 1 Application topically 2 (two) times a week. 06/19/22   Ladell Pier, MD  oxyCODONE-acetaminophen (PERCOCET) 10-325 MG tablet Take 1 tablet by mouth every 8 (eight) hours as needed for pain (Fill on or after2/11/2020). 08/20/20   Ladell Pier, MD      Allergies    Zoloft [sertraline]    Review of Systems   Review of Systems  Constitutional:  Negative for chills and fever.  Respiratory:  Negative for shortness of  breath.   Cardiovascular:  Negative for chest pain.  Gastrointestinal:  Positive for abdominal pain.  Genitourinary:  Positive for vaginal bleeding. Negative for dysuria and vaginal discharge.  All other systems reviewed and are negative.   Physical Exam Updated Vital Signs BP 111/70   Pulse 69   Temp 98.4 F (36.9 C)   Resp 16   Ht 5' 8.5" (1.74 m)   Wt 107.5 kg   SpO2 99%   BMI 35.51 kg/m  Physical Exam Vitals and nursing note reviewed.  Constitutional:      General: She is not in acute distress.    Appearance: She is not ill-appearing.  HENT:     Head: Normocephalic.  Eyes:     Pupils: Pupils are equal, round, and reactive to light.  Cardiovascular:     Rate and Rhythm: Normal rate and regular rhythm.     Pulses: Normal pulses.     Heart sounds: Normal heart sounds. No murmur heard.    No friction rub. No gallop.  Pulmonary:     Effort: Pulmonary effort is normal.     Breath sounds: Normal breath sounds.  Abdominal:     General: Abdomen is flat. There is no distension.     Palpations: Abdomen is soft.     Tenderness: There is no abdominal tenderness. There is no guarding or rebound.  Musculoskeletal:        General: Normal range of motion.  Cervical back: Neck supple.  Skin:    General: Skin is warm and dry.  Neurological:     General: No focal deficit present.     Mental Status: She is alert.  Psychiatric:        Mood and Affect: Mood normal.        Behavior: Behavior normal.     ED Results / Procedures / Treatments   Labs (all labs ordered are listed, but only abnormal results are displayed) Labs Reviewed  COMPREHENSIVE METABOLIC PANEL - Abnormal; Notable for the following components:      Result Value   Calcium 8.6 (*)    All other components within normal limits  CBC WITH DIFFERENTIAL/PLATELET  LIPASE, BLOOD  URINALYSIS, ROUTINE W REFLEX MICROSCOPIC  I-STAT BETA HCG BLOOD, ED ([facility], [facility], [facility] ONLY)    EKG None  Radiology US Pelvis  Complete  Result Date: 10/02/2022 CLINICAL DATA:  Pelvic pain EXAM: TRANSABDOMINAL AND TRANSVAGINAL ULTRASOUND OF PELVIS DOPPLER ULTRASOUND OF OVARIES TECHNIQUE: Both transabdominal and transvaginal ultrasound examinations of the pelvis were performed. Transabdominal technique was performed for global imaging of the pelvis including uterus, ovaries, adnexal regions, and pelvic cul-de-sac. It was necessary to proceed with endovaginal exam following the transabdominal exam to visualize the pelvic structures. Color and duplex Doppler ultrasound was utilized to evaluate blood flow to the ovaries. COMPARISON:  10/27/2014. FINDINGS: Uterus Measurements: Anteverted, 8 x 5 x 4 cm . Diffuse myometrial inhomogeneity which may be due to adenomyosis. Fundal intramural fibroid measures 0.8 cm. Endometrium Thickness: 0.5 cm. No focal abnormality visualized. Uterine cavity empty. Right ovary Unremarkable, 1.9 x 1.0 x 1.0 cm Left ovary Unremarkable, 1.2 x 1.2 x 1.1 cm. Pulsed Doppler evaluation of both ovaries demonstrates normal arterial and venous flow . Other findings No masses or fluid collections. IMPRESSION: 1. Uterine myometrial inhomogeneity which may be seen with adenomyosis. 2. Small intramural fibroid. 3. No adnexal pathology. 4. Ovaries demonstrate normal blood flow with Doppler. Electronically Signed   By: Sammie Bench M.D.   On: 10/02/2022 22:00   US Transvaginal Non-OB  Result Date: 10/02/2022 CLINICAL DATA:  Pelvic pain EXAM: TRANSABDOMINAL AND TRANSVAGINAL ULTRASOUND OF PELVIS DOPPLER ULTRASOUND OF OVARIES TECHNIQUE: Both transabdominal and transvaginal ultrasound examinations of the pelvis were performed. Transabdominal technique was performed for global imaging of the pelvis including uterus, ovaries, adnexal regions, and pelvic cul-de-sac. It was necessary to proceed with endovaginal exam following the transabdominal exam to visualize the pelvic structures. Color and duplex Doppler ultrasound was  utilized to evaluate blood flow to the ovaries. COMPARISON:  10/27/2014. FINDINGS: Uterus Measurements: Anteverted, 8 x 5 x 4 cm . Diffuse myometrial inhomogeneity which may be due to adenomyosis. Fundal intramural fibroid measures 0.8 cm. Endometrium Thickness: 0.5 cm. No focal abnormality visualized. Uterine cavity empty. Right ovary Unremarkable, 1.9 x 1.0 x 1.0 cm Left ovary Unremarkable, 1.2 x 1.2 x 1.1 cm. Pulsed Doppler evaluation of both ovaries demonstrates normal arterial and venous flow . Other findings No masses or fluid collections. IMPRESSION: 1. Uterine myometrial inhomogeneity which may be seen with adenomyosis. 2. Small intramural fibroid. 3. No adnexal pathology. 4. Ovaries demonstrate normal blood flow with Doppler. Electronically Signed   By: Sammie Bench M.D.   On: 10/02/2022 22:00   [place] Art/Ven Flow Abd Pelv Doppler  Result Date: 10/02/2022 CLINICAL DATA:  Pelvic pain EXAM: TRANSABDOMINAL AND TRANSVAGINAL ULTRASOUND OF PELVIS DOPPLER ULTRASOUND OF OVARIES TECHNIQUE: Both transabdominal and transvaginal ultrasound examinations of the pelvis were performed. Transabdominal technique was  performed for global imaging of the pelvis including uterus, ovaries, adnexal regions, and pelvic cul-de-sac. It was necessary to proceed with endovaginal exam following the transabdominal exam to visualize the pelvic structures. Color and duplex Doppler ultrasound was utilized to evaluate blood flow to the ovaries. COMPARISON:  10/27/2014. FINDINGS: Uterus Measurements: Anteverted, 8 x 5 x 4 cm . Diffuse myometrial inhomogeneity which may be due to adenomyosis. Fundal intramural fibroid measures 0.8 cm. Endometrium Thickness: 0.5 cm. No focal abnormality visualized. Uterine cavity empty. Right ovary Unremarkable, 1.9 x 1.0 x 1.0 cm Left ovary Unremarkable, 1.2 x 1.2 x 1.1 cm. Pulsed Doppler evaluation of both ovaries demonstrates normal arterial and venous flow . Other findings No masses or fluid  collections. IMPRESSION: 1. Uterine myometrial inhomogeneity which may be seen with adenomyosis. 2. Small intramural fibroid. 3. No adnexal pathology. 4. Ovaries demonstrate normal blood flow with Doppler. Electronically Signed   By: Sammie Bench M.D.   On: 10/02/2022 22:00   CT ABDOMEN PELVIS W CONTRAST  Result Date: 10/02/2022 CLINICAL DATA:  Left lower quadrant abdominal pain EXAM: CT ABDOMEN AND PELVIS WITH CONTRAST TECHNIQUE: Multidetector CT imaging of the abdomen and pelvis was performed using the standard protocol following bolus administration of intravenous contrast. RADIATION DOSE REDUCTION: This exam was performed according to the departmental dose-optimization program which includes automated exposure control, adjustment of the mA and/or kV according to patient size and/or use of iterative reconstruction technique. CONTRAST:  154mL OMNIPAQUE IOHEXOL 300 MG/ML  SOLN COMPARISON:  08/20/2019 FINDINGS: Lower chest: No acute abnormality. Hepatobiliary: No focal liver abnormality is seen. Status post cholecystectomy. No biliary dilatation. Pancreas: Unremarkable Spleen: Unremarkable Adrenals/Urinary Tract: Adrenal glands are unremarkable. Kidneys are normal, without renal calculi, focal lesion, or hydronephrosis. Bladder is unremarkable. Stomach/Bowel: Surgical changes of gastric sleeve resection noted. Severe sigmoid diverticulosis without superimposed acute inflammatory change. The stomach, small bowel, and large bowel are otherwise unremarkable. Appendix normal. No free intraperitoneal gas or fluid. Vascular/Lymphatic: No significant vascular findings are present. No enlarged abdominal or pelvic lymph nodes. Reproductive: Status post bilateral tubal ligation. The pelvic organs are otherwise unremarkable. Other: Small fat containing umbilical hernia. Musculoskeletal: Osseous structures are age-appropriate. No acute bone abnormality. No lytic or blastic bone lesion. IMPRESSION: 1. No acute  intra-abdominal pathology identified. No definite radiographic explanation for the patient's reported symptoms. 2. Severe sigmoid diverticulosis without superimposed acute inflammatory change. Electronically Signed   By: Fidela Salisbury M.D.   On: 10/02/2022 20:34    Procedures Procedures    Medications Ordered in ED Medications  morphine (PF) 4 MG/ML injection 4 mg (4 mg Intravenous Given 10/02/22 1930)  iohexol (OMNIPAQUE) 300 MG/ML solution 100 mL (100 mLs Intravenous Contrast Given 10/02/22 2012)    ED Course/ Medical Decision Making/ A&P                             Medical Decision Making Amount and/or Complexity of Data Reviewed Labs: ordered. Decision-making details documented in ED Course. Radiology: ordered and independent interpretation performed. Decision-making details documented in ED Course.  Risk Prescription drug management.   This patient presents to the ED for concern of LLQ abdominal pain, this involves an extensive number of treatment options, and is a complaint that carries with it a high risk of complications and morbidity.  The differential diagnosis includes diverticulitis, ovarian cyst, kidney stone, etc  42 year old female with history of PCOS and kidney stones presents to the ED due to left  lower quadrant abdominal pain that radiates to flank and left low back.  No nausea or vomiting.  Previous cholecystectomy and gastric sleeve surgery.  Upon arrival, patient afebrile, not tachycardic or hypoxic.  Patient no acute distress.  Mild tenderness in left lower quadrants. Abdominal labs ordered in triage. Added CT abdomen to rule out needs of kidney stone versus diverticulitis versus pelvic etiology.  CBC unremarkable.  No leukocytosis and normal hemoglobin.  UA negative for signs of infection.  Doubt pyelonephritis.  CMP reassuring.  Normal renal function.  No major electrolyte derangements.  Pregnancy test negative.  Doubt ectopic pregnancy.  Lipase normal.  Doubt  pancreatitis.  CT abdomen personally reviewed and interpreted negative for any acute abnormalities.  Does demonstrate severe sigmoid diverticulosis without evidence of diverticulitis.  On reassessment, patient admits to continued left sided pelvic pain.  Ultrasound ordered to rule out evidence of torsion versus cysts.   Ultrasound demonstrates adenomyosis and small fibroid.  No adnexal pathology.  No evidence of ovarian torsion.  Patient admits to improvement in pain after morphine.  Advised patient to follow-up with GYN for further evaluation. Strict ED precautions discussed with patient. Patient states understanding and agrees to plan. Patient discharged home in no acute distress and stable vitals  Has PCP        Final Clinical Impression(s) / ED Diagnoses Final diagnoses:  Pelvic pain    Rx / DC Orders ED Discharge Orders     None         Karie Kirks 10/02/22 2207    Valarie Merino, MD 10/02/22 2326

## 2022-10-02 NOTE — ED Provider Triage Note (Signed)
Emergency Medicine Provider Triage Evaluation Note  Amy Mendoza , a 42 y.o. female  was evaluated in triage.  She has a history of cholecystectomy and gastric sleeve surgery.  Pt complains of left-sided lower abdominal and pelvic pain.  She has had the symptoms in the past when she has had ruptured ovarian cyst.  She states that she has been diagnosed with PCOS.  She denies associated fever, vomiting, or diarrhea.  No urinary symptoms.  Review of Systems  Positive: Abdominal pain Negative: Fever  Physical Exam  BP (!) 132/92 (BP Location: Left Arm)   Pulse 96   Temp 98.4 F (36.9 C) (Oral)   Resp 18   Ht 5' 8.5" (1.74 m)   Wt 107.5 kg   SpO2 100%   BMI 35.51 kg/m  Gen:   Awake, no distress   Resp:  Normal effort  MSK:   Moves extremities without difficulty  Other:  Left lower quadrant abdominal tenderness to palpation, mild, no rebound or guarding  Medical Decision Making  Medically screening exam initiated at 3:41 PM.  Appropriate orders placed.  Loralee Pacas was informed that the remainder of the evaluation will be completed by another provider, this initial triage assessment does not replace that evaluation, and the importance of remaining in the ED until their evaluation is complete.     Carlisle Cater, PA-C 10/02/22 [id number]

## 2022-10-02 NOTE — ED Triage Notes (Signed)
LLQ abd pain radiating into back x1 day. Pt reports that she gets this pain monthly r/t her PCOS cysts rupturing. Spotting reported.

## 2022-10-02 NOTE — Discharge Instructions (Addendum)
It was a pleasure taking care of you today.  Your CT scan was normal.  Your ultrasound showed adenomyosis and a small fibroid.  No evidence of ovarian torsion.  Please follow-up with your OB/GYN for further evaluation.  Return to the ER for any worsening symptoms.

## 2022-10-18 ENCOUNTER — Other Ambulatory Visit: Payer: Self-pay | Admitting: Internal Medicine

## 2022-10-28 ENCOUNTER — Emergency Department (HOSPITAL_COMMUNITY)
Admission: EM | Admit: 2022-10-28 | Discharge: 2022-10-28 | Discharge status: Against Medical Advice | Payer: BLUE CROSS/BLUE SHIELD

## 2023-02-08 NOTE — Progress Notes (Deleted)
No chief complaint on file.   HISTORY OF PRESENT ILLNESS:  02/13/2023 ALL: Amy Mendoza returns for follow up for headaches. She was last seen 07/2022 and started on amitriptyline.   08/09/2022 ALL:  Amy Mendoza returns for follow up for headaches. She was started on propranolol 20mg  BID at last visit 12/2021. She reports taking one dose and felt as if she was going to pass out. Heart rate was in the 50s. She has not reached out to let me know. She reports headaches continue to wax and wane. She continues to have right sided sinus pressure. She was treated for a sinus infection but symptoms have not improved. She reports having a dull pain in the center of her scalp that is constant. She reports taking oxycodone 10-325 1-2 tablets 3-4 times weekly. PDMP shows refills for 90 tablets every 30 days for the past 3 months. She is drinking 50-60 ounces of water daily. She works nights. She has stopped anxiety medications. She continues to follow up closely with PCP.   01/03/2022 ALL:  Amy Mendoza returns for follow up for headaches. She was last seen 05/2020 and reported chronic tension headaches with associated vocalizations. Neuro workup was unremarkable. She was encouraged to continue follow up with psychiatry and consider starting paroxetine as recommended. Since, she reports headaches have continued. She reports headaches occur, on average, 16 days a month (4 per week). Some headaches are bifrontal. Some are right sided. She describes pain as a pressure sensation. She denies light or sound sensitivity. She does have nausea at times. She has not noted any correlation of headache intensity with activity or rest. No obvious triggers. She reports BP is usually 120's/80s. No significant history of asthma. She has seasonal allergies. History of kidney stones, last in 2019. No ringing in ears. She was advised by PCP to get sleep study. Has deviated septum and seeing ENT.   She did start paroxetine but discontinued as  she didn't like how she felt. She was started on hydroxyzine that seems to help anxiety. She has not seen them recently. She has been followed by PCP. She broke her left arm and has had three surgeries since. She is taking gabapentin 100mg  TID. Oxy only as needed.   05/25/2020 ALL:  Amy Mendoza is a 42 y.o. female here today for follow up for chronic episodic headaches. 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 We started tizanidine. She reports that she never got this medication.   She reports that head pressure and vocalization concerns continue. She feels symptoms wax and wane. Head pressure feels that it tightens up and then releases when she yells out. She reports vocalization occurs with any type of pain. She shares a video with me int he office today where she was having head pain. She has random episodes where she will yell out or groan. She is seeing psychiatry. She was advised to take Paxil but has been scared to start it. She continues to have abdominal muscle spasms. She feels that she has trouble with short term memory loss. She has decreased motivation. She feels that she is gaining weight. She feels hungry but then feels that she doesn't have an appetite. She gets anxious about pain coming on if she exercises. She has chest pains when she is anxious. She has started B12 for low normal levels that she thought could be causing bilateral leg pain. EMG/NCS  normal. She was diagnosed with Covid in 06/2019 and concerned these symptoms are related.    HISTORY (copied from my note on 01/21/2020)  Amy Mendoza is a 42 y.o. female here today for follow up. She reports that twitching has improved significantly. She reports a "vocalization" when she has a headache or stomach pains. She describes a moan or groan that "comes out different with one pain and a different way with the other". She has not had any migrainous  headaches. She describes a tension type pain. Pain is bilateral in the cheek and jaw area. She reports significant jaw pain after work. 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 She has taken OTC allergy medications that have helped. She is seeing GI for stomach aches. She was started on Bentyl but not sure if it is helping. She has an appt with psychiatry this Saturday. She does not feel symptoms are related to anxiety.    HISTORY: (copied from Dr Lucia Gaskins note on 10/21/2019)    HPI:  Amy Mendoza is a 42 y.o. female here as requested by Marcine Matar, MD for tic disorder. PMHx anxiety, obesity. Reviewed Dr. Henriette Combs notes: she developed body twitching in early January, the twitching comes "in waves" some days no twitching at all, she stopped drinking caffeine. She was started on zoloft. I reviewed ED notes: She presented to the ED 3/9 for persistent tics and anxiety disorder, tics controlled with cognitive behavioral therapy and breathing exercises and they thought she would benefit from behavioral therapy. She also presented 3/24 for migraine to the ER and then again on 3/29. She was in the ED 3/31 as well for migraine, facial pressure, chest pains, grunting and clearing her throat during exam which she describes as "fits". She then presented to the ER again 10/19/19 for facial pressure and pain, taking ketorolac and reglan, and also stated that many symptoms likely due to anxiety, she had maxillary tenderness and dxed with a sinus headache and told to use flonase, antihistamines and nasal rinses. She has also visited the ED at [facility] as well on 3/30 with similar headache symptoms, diagnosed with acute tension-type headache and motor and vocal tics, exam was normal per notes. She has also been evaluated by cardiology for palpitations in the past per review of records as well.    She is here alone. We discussed due to limited time we will focus on migraines today, I will address tic  disorder briefly but patient should really be seen in psychiatry for this disorder as they can provide cognitive behavioral therapy or other therapy and can also treat with medication. She has a hx of migraines around menses for many years episodic but after covid started having significant headaches and pressure in the head and face, she had Covid in [place] and was hospitalized, she was home on Dec 22nd, the following week she was feeling better but still with significant fatigue. January 8th she went to the emergency room, she had chest pain, felt like she was dying, they thought she was having an anxiety atack she had multiple visits for this. In February headaches worsened, has progressively gotten worse and she went to the ED many times for headache as well. She feels like electric shocks in the head, pressure in the head severe, worst headaches of life and "brain zaps". Sensory changes left side of the face, light and sound sensitivity. Working made it worse. Headaches/migraines can last days, going into a  dark room helps, pulsating/pounding/throbbing, pressure in the face and jaw, positional worse with bending over, blurry vision. She is having the headaches every day. Shooting pain through the nose. Headaches are throbbing and all over, can be unilateral in the temples also in the back of the head. Never been on migraine medications in the past. She says her tics are in arms and shoulders, feels like it does it on its own, jerking movements, she makes vocalizations.    Reviewed notes, labs and imaging from outside physicians, which showed: see above, cbc and cmp unremarkable 10/13/2019   REVIEW OF SYSTEMS: Out of a complete 14 system review of symptoms, the patient complains only of the following symptoms, intermittent head pressure, vocalizations, anxiety, and all other reviewed systems are negative.   ALLERGIES: Allergies  Allergen Reactions   Zoloft [Sertraline]     Pt states it made her  crazy/worsened anxiety.      HOME MEDICATIONS: Outpatient Medications Prior to Visit  Medication Sig Dispense Refill   cyclobenzaprine (FLEXERIL) 10 MG tablet Take by mouth 3 (three) times daily as needed.     dicyclomine (BENTYL) 10 MG capsule Take 10 mg by mouth. 1 tab PO TID     fluticasone (FLONASE) 50 MCG/ACT nasal spray PLACE 1 SPRAY INTO BOTH NOSTRILS DAILY AS NEEDED FOR ALLERGIES OR RHINITIS. (Patient not taking: Reported on 08/09/2022) 48 mL 3   gabapentin (NEURONTIN) 100 MG capsule Take 100 mg by mouth 3 (three) times daily.     ketoconazole (NIZORAL) 2 % shampoo Apply 1 Application topically 2 (two) times a week. 120 mL 2   spironolactone (ALDACTONE) 50 MG tablet Take 50 mg by mouth daily.     No facility-administered medications prior to visit.     PAST MEDICAL HISTORY: Past Medical History:  Diagnosis Date   Anxiety    Asthma    Chronic headaches    COVID-19    Gallstones    Gastritis    GERD (gastroesophageal reflux disease)    Hyperlipemia    no meds   Kidney stones      PAST SURGICAL HISTORY: Past Surgical History:  Procedure Laterality Date   BARIATRIC SURGERY  05/17/2022   CHOLECYSTECTOMY  08/2004   COLONOSCOPY     KNEE SURGERY Left      FAMILY HISTORY: Family History  Problem Relation Age of Onset   Diabetes Father    Heart failure Father    Heart attack Father    Prostate cancer Maternal Grandfather    Pancreatic cancer Maternal Grandfather    Colon cancer Maternal Grandfather    Breast cancer Maternal Aunt    Ovarian cancer Mother    Diabetes Maternal Grandmother    Kidney Stones Maternal Grandmother    Heart attack Paternal Grandfather    Heart attack Paternal Aunt    Diabetes Maternal Uncle    Asthma Son    Tics Neg Hx    Migraines Neg Hx    Esophageal cancer Neg Hx    Rectal cancer Neg Hx    Stomach cancer Neg Hx      SOCIAL HISTORY: Social History   Socioeconomic History   Marital status: Single    Spouse name: Not on  file   Number of children: 1   Years of education: Not on file   Highest education level: Associate degree: occupational, Scientist, product/process development, or vocational program  Occupational History   Occupation: LPN  Tobacco Use   Smoking status: Never   Smokeless tobacco: Never  Vaping Use   Vaping status: Former  Substance and Sexual Activity   Alcohol use: Not Currently    Comment: occasionally-social   Drug use: Not Currently    Comment: tried marijuana once in an edible but it made her freak out.    Sexual activity: Yes    Birth control/protection: I.U.D.  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   Right handed   Caffeine: none    Social Determinants of Health   Financial Resource Strain: Low Risk  (11/23/2021)   Received from [facility], [facility]   Overall Financial Resource Strain (CARDIA)    Difficulty of Paying Living Expenses: Not hard at all  Food Insecurity: Not on file  Transportation Needs: Not on file  Physical Activity: Not on file  Stress: Not on file  Social Connections: Not on file  Intimate Partner Violence: Not on file      PHYSICAL EXAM  There were no vitals filed for this visit.   There is no height or weight on file to calculate BMI.   Generalized: Well developed, in no acute distress   Cardiology: Normal rate and rhythm, no murmur auscultated Respiratory: Clear to auscultation bilaterally  Neurological examination  Mentation: Alert oriented to time, place, history taking. Follows all commands speech and language fluent Cranial nerve II-XII: Pupils were equal round reactive to light. Extraocular movements were full, visual field were full  Motor: The motor testing reveals 5 over 5 strength of all 4 extremities. Good symmetric motor tone is noted throughout.  Gait and station: Gait is normal.     DIAGNOSTIC DATA (LABS, IMAGING, TESTING) - I reviewed  patient records, labs, notes, testing and imaging myself where available.  Lab Results  Component Value Date   WBC 7.8 10/02/2022   HGB 13.2 10/02/2022   HCT 41.1 10/02/2022   MCV 87.3 10/02/2022   PLT 268 10/02/2022      Component Value Date/Time   NA 137 10/02/2022 1552   NA 142 09/18/2019 1605   K 3.6 10/02/2022 1552   CL 106 10/02/2022 1552   CO2 26 10/02/2022 1552   GLUCOSE 98 10/02/2022 1552   BUN 11 10/02/2022 1552   BUN 7 09/18/2019 1605   CREATININE 0.75 10/02/2022 1552   CREATININE 0.84 02/07/2016 1719   CALCIUM 8.6 (L) 10/02/2022 1552   PROT 7.4 10/02/2022 1552   PROT 7.1 12/30/2020 1515   ALBUMIN 3.6 10/02/2022 1552   ALBUMIN 4.2 12/30/2020 1515   AST 15 10/02/2022 1552   ALT 21 10/02/2022 1552   ALKPHOS 60 10/02/2022 1552   BILITOT 0.8 10/02/2022 1552   BILITOT 0.6 12/30/2020 1515   GFRNONAA >60 10/02/2022 1552   GFRAA >60 02/03/2020 1608   Lab Results  Component Value Date   CHOL 182 12/30/2020   HDL 38 (L) 12/30/2020   LDLCALC 121 (H) 12/30/2020   TRIG 130 12/30/2020   CHOLHDL 4.8 (H) 12/30/2020   Lab Results  Component Value Date   HGBA1C 5.6 12/30/2020   Lab Results  Component Value Date   VITAMINB12 339 02/12/2020   Lab Results  Component Value Date   TSH 0.912 02/12/2020    ASSESSMENT AND PLAN  42 y.o. year old female  has a past medical history of Anxiety, Asthma, Chronic headaches, COVID-19, Gallstones, Gastritis, GERD (gastroesophageal reflux disease), Hyperlipemia, and Kidney stones. here with  No diagnosis found.  Amy Mendoza presents today with continued concerns of intermittent head pressure. Now having tenderness of scalp with touch. No clear migrainous symptoms. I will ave her try low dose amitriptyline. Advised to take 5mg  QHS for 4-5 days then increase to 10mg  QHS. Potential side effects and time needed to determine effectiveness discussed. Education provided in AVS. I have advised that she try to wean oxycodone as this could  be causing rebound headaches. She has an appt with ortho next week to discuss. Healthy lifestyle habits encouraged. Continue close follow up with PCP.  She will follow-up in 6 months. She verbalizes understanding and agreement with this plan.   Shawnie Dapper, MSN, FNP-C 02/08/2023, 4:41 PM  [facility] [address], [address] [place], [place] [address] [phone]

## 2023-02-13 ENCOUNTER — Ambulatory Visit: Payer: BLUE CROSS/BLUE SHIELD | Admitting: Family Medicine

## 2023-02-13 DIAGNOSIS — R519 Headache, unspecified: Secondary | ICD-10-CM

## 2023-03-14 ENCOUNTER — Encounter: Payer: Self-pay | Admitting: Internal Medicine

## 2023-04-30 ENCOUNTER — Other Ambulatory Visit: Payer: Self-pay

## 2023-04-30 ENCOUNTER — Encounter (HOSPITAL_COMMUNITY): Payer: Self-pay

## 2023-04-30 ENCOUNTER — Emergency Department (HOSPITAL_COMMUNITY)
Admission: EM | Admit: 2023-04-30 | Discharge: 2023-04-30 | Disposition: A | Discharge status: Home / Self Care | Payer: BLUE CROSS/BLUE SHIELD | Attending: Emergency Medicine | Admitting: Emergency Medicine

## 2023-04-30 ENCOUNTER — Ambulatory Visit: Payer: Self-pay

## 2023-04-30 DIAGNOSIS — R531 Weakness: Secondary | ICD-10-CM | POA: Insufficient documentation

## 2023-04-30 DIAGNOSIS — R29898 Other symptoms and signs involving the musculoskeletal system: Secondary | ICD-10-CM

## 2023-04-30 LAB — BASIC METABOLIC PANEL
Anion gap: 11 (ref 5–15)
BUN: 12 mg/dL (ref 6–20)
CO2: 25 mmol/L (ref 22–32)
Calcium: 9.4 mg/dL (ref 8.9–10.3)
Chloride: 103 mmol/L (ref 98–111)
Creatinine, Ser: 0.67 mg/dL (ref 0.44–1.00)
GFR, Estimated: >60 mL/min (ref >60)
Glucose, Bld: 81 mg/dL (ref 70–99)
Potassium: 3.6 mmol/L (ref 3.5–5.1)
Sodium: 139 mmol/L (ref 135–145)

## 2023-04-30 LAB — CBC WITH DIFFERENTIAL/PLATELET
Abs Immature Granulocytes: 0.01 10*3/uL (ref 0.00–0.07)
Basophils Absolute: 0 10*3/uL (ref 0.0–0.1)
Basophils Relative: 1 %
Eosinophils Absolute: 0.1 10*3/uL (ref 0.0–0.5)
Eosinophils Relative: 1 %
HCT: 41.9 % (ref 36.0–46.0)
Hemoglobin: 13.2 g/dL (ref 12.0–15.0)
Immature Granulocytes: 0 %
Lymphocytes Relative: 41 %
Lymphs Abs: 2.7 10*3/uL (ref 0.7–4.0)
MCH: 27.7 pg (ref 26.0–34.0)
MCHC: 31.5 g/dL (ref 30.0–36.0)
MCV: 88 fL (ref 80.0–100.0)
Monocytes Absolute: 0.3 10*3/uL (ref 0.1–1.0)
Monocytes Relative: 5 %
Neutro Abs: 3.4 10*3/uL (ref 1.7–7.7)
Neutrophils Relative %: 52 %
Platelets: 283 10*3/uL (ref 150–400)
RBC: 4.76 MIL/uL (ref 3.87–5.11)
RDW: 12.5 % (ref 11.5–15.5)
WBC: 6.6 10*3/uL (ref 4.0–10.5)
nRBC: 0 % (ref 0.0–0.2)

## 2023-04-30 LAB — MAGNESIUM: Magnesium: 1.9 mg/dL (ref 1.7–2.4)

## 2023-04-30 LAB — VITAMIN B12: Vitamin B-12: 1118 pg/mL — ABNORMAL HIGH (ref 180–914)

## 2023-04-30 NOTE — Telephone Encounter (Signed)
     Chief Complaint: Leg weakness, feels jittery. Had a panic attack last week. Has had some nausea. Symptoms: Above Frequency: Last week Pertinent Negatives: Patient denies  Disposition: [] ED /[] Urgent Care (no appt availability in office) / [] Appointment(In office/virtual)/ []  [facility]/ [] Home Care/ [] Refused Recommended Disposition /[x] [facility]/ []  Follow-up with PCP Additional Notes: Agrees with mobile unit.  Reason for Disposition  [1] MODERATE weakness (i.e., interferes with work, school, normal activities) AND [2] cause unknown  (Exceptions: Weakness from acute minor illness or poor fluid intake; weakness is chronic and not worse.)  Answer Assessment - Initial Assessment Questions 1. DESCRIPTION: "Describe how you are feeling."     Legs are weak, I feel jittery 2. SEVERITY: "How bad is it?"  "Can you stand and walk?"   - MILD (0-3): Feels weak or tired, but does not interfere with work, school or normal activities.   - MODERATE (4-7): Able to stand and walk; weakness interferes with work, school, or normal activities.   - SEVERE (8-10): Unable to stand or walk; unable to do usual activities.     Moderate 3. ONSET: "When did these symptoms begin?" (e.g., hours, days, weeks, months)     Last week 4. CAUSE: "What do you think is causing the weakness or fatigue?" (e.g., not drinking enough fluids, medical problem, trouble sleeping)     Unsure 5. NEW MEDICINES:  "Have you started on any new medicines recently?" (e.g., opioid pain medicines, benzodiazepines, muscle relaxants, antidepressants, antihistamines, neuroleptics, beta blockers)     No 6. OTHER SYMPTOMS: "Do you have any other symptoms?" (e.g., chest pain, fever, cough, SOB, vomiting, diarrhea, bleeding, other areas of pain)     Decreased appetite 7. PREGNANCY: "Is there any chance you are pregnant?" "When was your last menstrual period?"     No  Protocols used: Weakness (Generalized) and  Fatigue-A-AH

## 2023-04-30 NOTE — ED Triage Notes (Signed)
Bilateral leg weakness for a week, left leg is worse than right. Pt reports fatigue and feeling jittery. States she had strep past week and wasn't eating and drinking like normal. Pt had penicillin injection and steroid infection last week.

## 2023-04-30 NOTE — ED Provider Notes (Signed)
[facility] Provider Note   CSN: [id number] Arrival date & time: 04/30/23  1351     History  Chief Complaint  Patient presents with   Extremity Weakness    Amy Mendoza is a 42 y.o. female.  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 The weakness is strictly below the knees and somewhat worse on the L. She reports that is makes her feel somewhat unbalanced when she walks, but grossly she can ambulate as normal. She denies changes in sensation, including numbness, tingling, burning. There is no bowel or bladder incontinence. She reports fatigue as well. This has happened before, often after an illness, originating from a Covid infection in 2020. She reports strep throat and one day of non-bloody diarrhea about two weeks ago (strep was treated with PCN). She sees a neurologist who did nerve conductions tests that were normal. She takes supplemental vitamins after her stomach procedure but has never had her B12 checked or specifically supplemented. She denies fevers, chills, rashes, tick bites, chest pain, dyspnea.    Extremity Weakness Pertinent negatives include no chest pain and no shortness of breath.       Home Medications Prior to Admission medications   Medication Sig Start Date End Date Taking? Authorizing Provider  cyclobenzaprine (FLEXERIL) 10 MG tablet Take 10 mg by mouth 3 (three) times daily as needed for muscle spasms. 04/04/21  Yes [provider]  dicyclomine (BENTYL) 10 MG capsule Take 10 mg by mouth 3 (three) times daily as needed for spasms. 06/07/22  Yes [provider]  gabapentin (NEURONTIN) 100 MG capsule Take 100 mg by mouth 3 (three) times daily as needed (for pain).   Yes [provider]  ibuprofen (IBU-200) 200 MG tablet Take 200-400 mg by mouth  every 6 (six) hours as needed (for headaches- when not taking Tylenol).   Yes [provider]  ketoconazole (NIZORAL) 2 % shampoo Apply 1 Application topically 2 (two) times a week. 06/19/22  Yes Marcine Matar, MD  oxyCODONE-acetaminophen (PERCOCET) 10-325 MG tablet Take 0.5-1 tablets by mouth every 8 (eight) hours as needed for pain.   Yes [provider]  TYLENOL 325 MG CAPS Take 325-650 mg by mouth every 6 (six) hours as needed (for headaches- when not taking Ibuprofen).   Yes [provider]  Vitamin D, Ergocalciferol, (DRISDOL) 1.25 MG (50000 UNIT) CAPS capsule Take 50,000 Units by mouth every Friday.   Yes [provider]  ZYRTEC ALLERGY 10 MG tablet Take 10 mg by mouth daily as needed for allergies or rhinitis.   Yes [provider]  fluticasone (FLONASE) 50 MCG/ACT nasal spray PLACE 1 SPRAY INTO BOTH NOSTRILS DAILY AS NEEDED FOR ALLERGIES OR RHINITIS. Patient not taking: Reported on 04/30/2023 05/29/21   Marcine Matar, MD      Allergies    Metformin and related and Sertraline    Review of Systems   Review of Systems  Constitutional:  Positive for fatigue. Negative for chills and fever.  HENT:  Negative for rhinorrhea and sore throat.   Eyes:  Negative for photophobia and pain.  Respiratory:  Negative for cough, chest tightness and shortness of breath.   Cardiovascular:  Negative for chest pain and palpitations.  Gastrointestinal:  Negative for abdominal distention, constipation, diarrhea, nausea and vomiting.  Musculoskeletal:  Positive for extremity weakness. Negative for arthralgias and myalgias.  Skin:  Negative for rash.    Physical Exam Updated Vital Signs BP 120/85 (BP Location: Left Arm)   Pulse 74   Temp 98.9 F (37.2 C) (Oral)   Resp 18   Ht 5' 8.5" (1.74 m)   Wt 107.5 kg   SpO2 100%   BMI 35.51 kg/m  Physical Exam Constitutional:      General: She is not in acute distress.    Appearance: Normal appearance.  She is obese. She is not ill-appearing.  HENT:     Head: Normocephalic and atraumatic.  Eyes:     Extraocular Movements: Extraocular movements intact.     Conjunctiva/sclera: Conjunctivae normal.     Pupils: Pupils are equal, round, and reactive to light.  Cardiovascular:     Rate and Rhythm: Normal rate and regular rhythm.     Pulses: Normal pulses.  Pulmonary:     Effort: Pulmonary effort is normal.     Breath sounds: Normal breath sounds. No wheezing or rales.  Abdominal:     General: Abdomen is flat.     Palpations: Abdomen is soft.     Tenderness: There is no abdominal tenderness.  Musculoskeletal:        General: No swelling or tenderness.     Right lower leg: No edema.     Left lower leg: No edema.  Skin:    General: Skin is warm and dry.  Neurological:     General: No focal deficit present.     Mental Status: She is alert and oriented to person, place, and time.     Cranial Nerves: No cranial nerve deficit.     Sensory: No sensory deficit.     Motor: Weakness present.     Coordination: Coordination normal.     Deep Tendon Reflexes: Reflexes normal.  Psychiatric:        Mood and Affect: Mood normal.        Behavior: Behavior normal.     ED Results / Procedures / Treatments   Labs (all labs ordered are listed, but only abnormal results are displayed) Labs Reviewed  VITAMIN B12 - Abnormal; Notable for the following components:      Result Value   Vitamin B-12 1,118 (*)    All other components within normal limits  BASIC METABOLIC PANEL  CBC WITH DIFFERENTIAL/PLATELET  MAGNESIUM    EKG None  Radiology No results found.  Procedures Procedures    Medications Ordered in ED Medications - No data to display  ED Course/ Medical Decision Making/ A&P Clinical Course as of 04/30/23 1812  Mon Apr 30, 2023  1507 Stable (Resident Case) Recent URI  Nonspecific weakness BL below the knee weakness.  No sensory changes. Had a similar complication with COVID  2020. Saw neuro. Not GBS or TM.   Exam is benign. 4/5 BLLE weakness with intact reflexes.  AP GBS- Less likely  Tick-Borne Illness- No history of exposure. Out of season TM- Not likely based on age/medical history  Viral Myositis RO Metabolic abnormalities due to history of  [CC]    Clinical Course User Index [CC] Glyn Ade, MD                                 Medical Decision Making This is a 42 yo F with history of gastric sleeve surgery and recent strep infection complaining of bilateral  leg weakness x 7 days, which is a recurrent issue for her. Differential includes Guillain-Barre, Tick-borne disease, vitamin deficiency, electrolyte disturbance, transverse myelitis, viral myositis. Vitals are unremarkable, just mild hypertension. Exam is not consistent with GBS/transverse myelitis. There is subtle weakness of the lower muscle groups and DTRs are intact. Denies recent fevers, chills, rashes, and has not found any ticks/no exposure. Given her recent illness (with reduced po intake) and gastric surgery, evaluated for acute and chronic electrolyte/anemia/B12. These are normal, B12 is slightly high. Given all of this, there does not appear to be a critical cause of her weakness. 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 Pt is safe for discharge to community with continued follow-up with her primary and neurology team.  Amount and/or Complexity of Data Reviewed Labs: ordered.          Final Clinical Impression(s) / ED Diagnoses Final diagnoses:  Weakness of both legs    Rx / DC Orders ED Discharge Orders     None         Katheran James, DO 04/30/23 1832    Glyn Ade, MD 04/30/23 2126

## 2023-04-30 NOTE — Discharge Instructions (Addendum)
Ms Haelee, Bolen were evaluated for lower leg weakness today. Fortunately, you do not have signs of critical conditions such as an infection or injury. I am sorry we could not give you a more definitive answer. Most likely, this weakness happened tue to mild muscle inflammation after your illness. Please return if you have sudden worsening of your weakness, are unable to walk, or develop shortness of breath.

## 2023-05-12 ENCOUNTER — Other Ambulatory Visit: Payer: Self-pay | Admitting: Internal Medicine

## 2023-05-12 DIAGNOSIS — L219 Seborrheic dermatitis, unspecified: Secondary | ICD-10-CM

## 2023-08-16 ENCOUNTER — Telehealth: Payer: Self-pay

## 2023-08-16 NOTE — Telephone Encounter (Signed)
Patient has been called and scheduled an appointment to discuss MM order.    Copied from CRM [id number]. Topic: Referral - Question >> Aug 16, 2023 11:34 AM Patsy Lager T wrote: Reason for CRM: patient called stated she was at the [facility] and was told she needs to go back to the imaging place she started with which is [facility]. Please f/u with patient as she needs to know if and why she would need another referral

## 2023-09-03 ENCOUNTER — Ambulatory Visit: Payer: BLUE CROSS/BLUE SHIELD | Admitting: Internal Medicine

## 2023-09-13 ENCOUNTER — Encounter: Payer: Self-pay | Admitting: Internal Medicine

## 2023-09-17 ENCOUNTER — Telehealth (HOSPITAL_BASED_OUTPATIENT_CLINIC_OR_DEPARTMENT_OTHER): Payer: BLUE CROSS/BLUE SHIELD | Admitting: Internal Medicine

## 2023-09-17 ENCOUNTER — Ambulatory Visit: Attending: Internal Medicine

## 2023-09-17 ENCOUNTER — Encounter: Payer: Self-pay | Admitting: Internal Medicine

## 2023-09-17 ENCOUNTER — Other Ambulatory Visit (HOSPITAL_COMMUNITY)
Admission: RE | Admit: 2023-09-17 | Discharge: 2023-09-17 | Disposition: A | Discharge status: Home / Self Care | Source: Ambulatory Visit | Attending: Internal Medicine | Admitting: Internal Medicine

## 2023-09-17 DIAGNOSIS — N938 Other specified abnormal uterine and vaginal bleeding: Secondary | ICD-10-CM | POA: Diagnosis not present

## 2023-09-17 DIAGNOSIS — N898 Other specified noninflammatory disorders of vagina: Secondary | ICD-10-CM | POA: Insufficient documentation

## 2023-09-17 NOTE — Progress Notes (Signed)
 Patient ID: Amy Mendoza, female   DOB: June 26, 1981, 43 y.o.   MRN: [id number] Virtual Visit via Video Note  I connected with Amy Mendoza on 09/17/2023 at 8:15 AM by a video enabled telemedicine application and verified that I am speaking with the correct person using two identifiers.  Location: Patient: home Provider: Office   I discussed the limitations of evaluation and management by telemedicine and the availability of in person appointments. The patient expressed understanding and agreed to proceed.  History of Present Illness: Patient with history of COVID-19 infection 06/2019, asthma, HL, tension HA, ? TMJ,  anxiety, obesity s/p wgh reduction surgery 05/2022, GERD.   Discussed the use of AI scribe software for clinical note transcription with the patient, who gave verbal consent to proceed.  History of Present Illness   The patient, with a history of endometrial ablation in October 2021 and weight loss surgery in November 2023, presents with vaginal discharge and spotting. She reports that after the endometrial ablation, she did not have a menstrual cycle until October 2024, when she began spotting. This progressed to a full cycle in November, lasting two to three days, which was lighter than her previous cycles. Had regular cycle in Dec 2024 and Jan 2025. In February, she noticed increased spotting, occurring three to four times throughout the month, and a persistent light brown discharge, which she describes as "clear and slimy." She has been wearing a panty liner throughout February due to this discharge. She denies any associated abdominal pain or foul odor. She has a history of bacterial vaginosis (BV) and suspects she may have it again due to the discharge, despite the absence of a smell. She also reports a history of polycystic ovary syndrome (PCOS) and was previously prescribed metformin and spironolactone. She is sexually active and has had the same partner for over five years.  She has a history of tubal ligation for birth control.       Outpatient Encounter Medications as of 09/17/2023  Medication Sig Note   cyclobenzaprine (FLEXERIL) 10 MG tablet Take 10 mg by mouth 3 (three) times daily as needed for muscle spasms.    dicyclomine (BENTYL) 10 MG capsule Take 10 mg by mouth 3 (three) times daily as needed for spasms.    fluticasone (FLONASE) 50 MCG/ACT nasal spray PLACE 1 SPRAY INTO BOTH NOSTRILS DAILY AS NEEDED FOR ALLERGIES OR RHINITIS. (Patient not taking: Reported on 04/30/2023)    gabapentin (NEURONTIN) 100 MG capsule Take 100 mg by mouth 3 (three) times daily as needed (for pain).    ibuprofen (IBU-200) 200 MG tablet Take 200-400 mg by mouth every 6 (six) hours as needed (for headaches- when not taking Tylenol).    ketoconazole (NIZORAL) 2 % shampoo Apply 1 Application topically 2 (two) times a week. 04/30/2023: PROVIDER: The patient has requested a NEW Rx for this (please)   oxyCODONE-acetaminophen (PERCOCET) 10-325 MG tablet Take 0.5-1 tablets by mouth every 8 (eight) hours as needed for pain.    TYLENOL 325 MG CAPS Take 325-650 mg by mouth every 6 (six) hours as needed (for headaches- when not taking Ibuprofen).    Vitamin D, Ergocalciferol, (DRISDOL) 1.25 MG (50000 UNIT) CAPS capsule Take 50,000 Units by mouth every Friday.    ZYRTEC ALLERGY 10 MG tablet Take 10 mg by mouth daily as needed for allergies or rhinitis.    No facility-administered encounter medications on file as of 09/17/2023.      Observations/Objective: Middle-age African-American female sitting in chair  in NAD  Assessment and Plan: 1. Dysfunctional uterine bleeding (Primary) I do not think the spotting that she has been having for the past month is due to BV.  I recommend referral to GYN for further evaluation. - Ambulatory referral to Gynecology  2. Vaginal discharge Patient will come today as a nurse only visit to do a self swab. - Cervicovaginal ancillary only   Follow Up  Instructions: As needed   I discussed the assessment and treatment plan with the patient. The patient was provided an opportunity to ask questions and all were answered. The patient agreed with the plan and demonstrated an understanding of the instructions.   The patient was advised to call back or seek an in-person evaluation if the symptoms worsen or if the condition fails to improve as anticipated.  I spent 12 minutes dedicated to the care of this patient on the date of this encounter to include previsit review of chart, face-to-face time with patient discussing diagnosis and management and post visit entering of orders.  This note has been created with Education officer, environmental. Any transcriptional errors are unintentional.  Jonah Blue, MD

## 2023-09-18 ENCOUNTER — Other Ambulatory Visit: Payer: Self-pay | Admitting: Internal Medicine

## 2023-09-18 ENCOUNTER — Encounter: Payer: Self-pay | Admitting: Internal Medicine

## 2023-09-18 LAB — CERVICOVAGINAL ANCILLARY ONLY
Bacterial Vaginitis (gardnerella): POSITIVE — AB
Candida Glabrata: NEGATIVE
Candida Vaginitis: NEGATIVE
Chlamydia: NEGATIVE
Comment: NEGATIVE
Comment: NEGATIVE
Comment: NEGATIVE
Comment: NEGATIVE
Comment: NEGATIVE
Comment: NORMAL
Neisseria Gonorrhea: NEGATIVE
Trichomonas: POSITIVE — AB

## 2023-09-18 MED ORDER — METRONIDAZOLE 500 MG PO TABS: 500.0000 mg | ORAL_TABLET | Freq: Two times a day (BID) | ORAL | 0 refills | Status: DC

## 2023-10-18 ENCOUNTER — Encounter: Payer: Self-pay | Admitting: Internal Medicine

## 2023-10-19 ENCOUNTER — Other Ambulatory Visit: Payer: Self-pay | Admitting: Family Medicine

## 2023-10-19 DIAGNOSIS — L219 Seborrheic dermatitis, unspecified: Secondary | ICD-10-CM

## 2023-10-19 MED ORDER — KETOCONAZOLE 2 % EX SHAM
1.0000 | MEDICATED_SHAMPOO | CUTANEOUS | 0 refills | Status: DC
Start: 2023-10-22 — End: 2023-11-16

## 2023-10-25 ENCOUNTER — Encounter: Payer: Self-pay | Admitting: Internal Medicine

## 2023-10-26 ENCOUNTER — Other Ambulatory Visit: Payer: Self-pay | Admitting: Internal Medicine

## 2023-10-26 MED ORDER — TRIAMCINOLONE ACETONIDE 0.1 % EX CREA: 1.0000 | TOPICAL_CREAM | Freq: Every day | CUTANEOUS | 0 refills | Status: DC

## 2023-11-01 ENCOUNTER — Ambulatory Visit: Payer: BLUE CROSS/BLUE SHIELD | Admitting: Internal Medicine

## 2023-11-16 ENCOUNTER — Other Ambulatory Visit: Payer: Self-pay | Admitting: Family Medicine

## 2023-11-16 DIAGNOSIS — L219 Seborrheic dermatitis, unspecified: Secondary | ICD-10-CM

## 2023-12-31 ENCOUNTER — Other Ambulatory Visit: Payer: Self-pay

## 2023-12-31 ENCOUNTER — Emergency Department (HOSPITAL_COMMUNITY)

## 2023-12-31 ENCOUNTER — Inpatient Hospital Stay (HOSPITAL_COMMUNITY)
Admission: EM | Admit: 2023-12-31 | Discharge: 2024-01-08 | DRG: 872 | Disposition: A | Discharge status: Home / Self Care | Attending: Family Medicine | Admitting: Family Medicine

## 2023-12-31 ENCOUNTER — Encounter (HOSPITAL_COMMUNITY): Payer: Self-pay

## 2023-12-31 DIAGNOSIS — K219 Gastro-esophageal reflux disease without esophagitis: Secondary | ICD-10-CM | POA: Diagnosis present

## 2023-12-31 DIAGNOSIS — Z9884 Bariatric surgery status: Secondary | ICD-10-CM | POA: Diagnosis not present

## 2023-12-31 DIAGNOSIS — D72829 Elevated white blood cell count, unspecified: Secondary | ICD-10-CM

## 2023-12-31 DIAGNOSIS — A419 Sepsis, unspecified organism: Secondary | ICD-10-CM | POA: Diagnosis present

## 2023-12-31 DIAGNOSIS — G4733 Obstructive sleep apnea (adult) (pediatric): Secondary | ICD-10-CM | POA: Diagnosis present

## 2023-12-31 DIAGNOSIS — K567 Ileus, unspecified: Secondary | ICD-10-CM | POA: Diagnosis not present

## 2023-12-31 DIAGNOSIS — K5792 Diverticulitis of intestine, part unspecified, without perforation or abscess without bleeding: Secondary | ICD-10-CM | POA: Diagnosis not present

## 2023-12-31 DIAGNOSIS — E876 Hypokalemia: Secondary | ICD-10-CM | POA: Diagnosis not present

## 2023-12-31 DIAGNOSIS — E878 Other disorders of electrolyte and fluid balance, not elsewhere classified: Secondary | ICD-10-CM | POA: Diagnosis not present

## 2023-12-31 DIAGNOSIS — K56600 Partial intestinal obstruction, unspecified as to cause: Secondary | ICD-10-CM | POA: Diagnosis not present

## 2023-12-31 DIAGNOSIS — E66811 Obesity, class 1: Secondary | ICD-10-CM | POA: Diagnosis present

## 2023-12-31 DIAGNOSIS — Z9049 Acquired absence of other specified parts of digestive tract: Secondary | ICD-10-CM | POA: Diagnosis not present

## 2023-12-31 DIAGNOSIS — E785 Hyperlipidemia, unspecified: Secondary | ICD-10-CM | POA: Diagnosis present

## 2023-12-31 DIAGNOSIS — Z6833 Body mass index (BMI) 33.0-33.9, adult: Secondary | ICD-10-CM | POA: Diagnosis not present

## 2023-12-31 DIAGNOSIS — E871 Hypo-osmolality and hyponatremia: Secondary | ICD-10-CM | POA: Diagnosis present

## 2023-12-31 DIAGNOSIS — K5732 Diverticulitis of large intestine without perforation or abscess without bleeding: Principal | ICD-10-CM | POA: Diagnosis present

## 2023-12-31 DIAGNOSIS — R7989 Other specified abnormal findings of blood chemistry: Secondary | ICD-10-CM | POA: Diagnosis present

## 2023-12-31 DIAGNOSIS — J4520 Mild intermittent asthma, uncomplicated: Secondary | ICD-10-CM | POA: Diagnosis present

## 2023-12-31 LAB — COMPREHENSIVE METABOLIC PANEL WITH GFR
ALT: 18 U/L (ref 0–44)
AST: 16 U/L (ref 15–41)
Albumin: 3.5 g/dL (ref 3.5–5.0)
Alkaline Phosphatase: 57 U/L (ref 38–126)
Anion gap: 8 (ref 5–15)
BUN: 9 mg/dL (ref 6–20)
CO2: 24 mmol/L (ref 22–32)
Calcium: 8.9 mg/dL (ref 8.9–10.3)
Chloride: 102 mmol/L (ref 98–111)
Creatinine, Ser: 0.75 mg/dL (ref 0.44–1.00)
GFR, Estimated: >60 mL/min (ref >60)
Glucose, Bld: 122 mg/dL — ABNORMAL HIGH (ref 70–99)
Potassium: 3.8 mmol/L (ref 3.5–5.1)
Sodium: 134 mmol/L — ABNORMAL LOW (ref 135–145)
Total Bilirubin: 1.6 mg/dL — ABNORMAL HIGH (ref 0.0–1.2)
Total Protein: 7.3 g/dL (ref 6.5–8.1)

## 2023-12-31 LAB — CBC
HCT: 39.1 % (ref 36.0–46.0)
Hemoglobin: 13 g/dL (ref 12.0–15.0)
MCH: 29.1 pg (ref 26.0–34.0)
MCHC: 33.2 g/dL (ref 30.0–36.0)
MCV: 87.5 fL (ref 80.0–100.0)
Platelets: 233 10*3/uL (ref 150–400)
RBC: 4.47 MIL/uL (ref 3.87–5.11)
RDW: 12.3 % (ref 11.5–15.5)
WBC: 17.4 10*3/uL — ABNORMAL HIGH (ref 4.0–10.5)
nRBC: 0 % (ref 0.0–0.2)

## 2023-12-31 LAB — URINALYSIS, ROUTINE W REFLEX MICROSCOPIC
Bilirubin Urine: NEGATIVE
Glucose, UA: NEGATIVE mg/dL
Hgb urine dipstick: NEGATIVE
Ketones, ur: NEGATIVE mg/dL
Leukocytes,Ua: NEGATIVE
Nitrite: NEGATIVE
Protein, ur: NEGATIVE mg/dL
Specific Gravity, Urine: >1.046 — ABNORMAL HIGH (ref 1.005–1.030)
pH: 7 (ref 5.0–8.0)

## 2023-12-31 LAB — LIPASE, BLOOD: Lipase: 26 U/L (ref 11–51)

## 2023-12-31 LAB — I-STAT CG4 LACTIC ACID, ED: Lactic Acid, Venous: 0.8 mmol/L (ref 0.5–1.9)

## 2023-12-31 LAB — HCG, SERUM, QUALITATIVE: Preg, Serum: NEGATIVE

## 2023-12-31 MED ORDER — PIPERACILLIN-TAZOBACTAM 3.375 G IVPB
3.3750 g | Freq: Three times a day (TID) | INTRAVENOUS | Status: DC
  Administered 2023-12-31 – 2024-01-06 (×14): 3.375 g via INTRAVENOUS
  Filled 2023-12-31 (×17): qty 50

## 2023-12-31 MED ORDER — ONDANSETRON HCL 4 MG/2ML IJ SOLN
4.0000 mg | Freq: Four times a day (QID) | INTRAMUSCULAR | Status: DC | PRN
Start: 2023-12-31 — End: 2024-01-08
  Administered 2024-01-02 – 2024-01-04 (×3): 4 mg via INTRAVENOUS
  Filled 2023-12-31 (×3): qty 2

## 2023-12-31 MED ORDER — ENOXAPARIN SODIUM 40 MG/0.4ML IJ SOSY
40.0000 mg | PREFILLED_SYRINGE | INTRAMUSCULAR | Status: DC
  Administered 2023-12-31 – 2024-01-07 (×8): 40 mg via SUBCUTANEOUS
  Filled 2023-12-31 (×8): qty 0.4

## 2023-12-31 MED ORDER — KETOROLAC TROMETHAMINE 30 MG/ML IJ SOLN
30.0000 mg | Freq: Once | INTRAMUSCULAR | Status: AC
  Administered 2023-12-31: 30 mg via INTRAVENOUS
  Filled 2023-12-31: qty 1

## 2023-12-31 MED ORDER — ONDANSETRON HCL 4 MG PO TABS
4.0000 mg | ORAL_TABLET | Freq: Four times a day (QID) | ORAL | Status: DC | PRN
Start: 2023-12-31 — End: 2024-01-08

## 2023-12-31 MED ORDER — ACETAMINOPHEN 650 MG RE SUPP: 650.0000 mg | Freq: Four times a day (QID) | RECTAL | Status: DC | PRN

## 2023-12-31 MED ORDER — IOHEXOL 300 MG/ML  SOLN
100.0000 mL | Freq: Once | INTRAMUSCULAR | Status: AC | PRN
  Administered 2023-12-31: 100 mL via INTRAVENOUS

## 2023-12-31 MED ORDER — HYDROMORPHONE HCL 1 MG/ML IJ SOLN
1.0000 mg | Freq: Once | INTRAMUSCULAR | Status: AC
  Filled 2023-12-31: qty 1

## 2023-12-31 MED ORDER — ONDANSETRON HCL 4 MG/2ML IJ SOLN
4.0000 mg | Freq: Once | INTRAMUSCULAR | Status: AC
  Administered 2023-12-31: 4 mg via INTRAVENOUS
  Filled 2023-12-31: qty 2

## 2023-12-31 MED ORDER — PIPERACILLIN-TAZOBACTAM 3.375 G IVPB 30 MIN
3.3750 g | Freq: Once | INTRAVENOUS | Status: AC
  Administered 2023-12-31: 3.375 g via INTRAVENOUS
  Filled 2023-12-31: qty 50

## 2023-12-31 MED ORDER — FENTANYL CITRATE PF 50 MCG/ML IJ SOSY
50.0000 ug | PREFILLED_SYRINGE | Freq: Once | INTRAMUSCULAR | Status: AC
  Filled 2023-12-31: qty 1

## 2023-12-31 MED ORDER — SODIUM CHLORIDE 0.9 % IV BOLUS
1000.0000 mL | Freq: Once | INTRAVENOUS | Status: AC
  Administered 2023-12-31: 1000 mL via INTRAVENOUS

## 2023-12-31 MED ORDER — ACETAMINOPHEN 325 MG PO TABS
650.0000 mg | ORAL_TABLET | Freq: Four times a day (QID) | ORAL | Status: DC | PRN
  Administered 2024-01-01 – 2024-01-06 (×3): 650 mg via ORAL
  Filled 2023-12-31 (×4): qty 2

## 2023-12-31 MED ORDER — HYDROMORPHONE HCL 1 MG/ML IJ SOLN
1.0000 mg | INTRAMUSCULAR | Status: DC | PRN
  Administered 2024-01-01 – 2024-01-02 (×5): 1 mg via INTRAVENOUS
  Filled 2023-12-31 (×7): qty 1

## 2023-12-31 MED ORDER — PANTOPRAZOLE SODIUM 40 MG IV SOLR
40.0000 mg | Freq: Once | INTRAVENOUS | Status: AC
  Administered 2023-12-31: 40 mg via INTRAVENOUS
  Filled 2023-12-31: qty 10

## 2023-12-31 NOTE — ED Triage Notes (Signed)
 Pt states that she has been having lower abdominal pain since yesterday with nausea. Denies vomiting, diarrhea or fevers. Denies dysuria

## 2023-12-31 NOTE — H&P (Signed)
 History and Physical    Patient: Amy Mendoza:[id number] DOB: 24-Nov-1980 DOA: 12/31/2023 DOS: the patient was seen and examined on 12/31/2023 PCP: Lawrance Presume, MD  Patient coming from: Home  Chief Complaint:  Chief Complaint  Patient presents with   Abdominal Pain   HPI: Amy Mendoza is a 43 y.o. female with medical history significant of anxiety, asthma, chronic headaches, COVID-19, cholelithiasis, gastritis, GERD, hyperlipidemia, nephrolithiasis who presented to the emergency department with complaints of severe abdominal pain since yesterday evening, associated with nausea.  The patient stated that she started having lower abdominal cramping on Saturday and thought that this was due to menstrual discomfort.  However, symptoms intensified significantly yesterday.  No emesis, diarrhea, constipation, melena or hematochezia.  No flank pain, dysuria, frequency or hematuria.  She denied fever, chills, rhinorrhea, sore throat, wheezing or hemoptysis.  No chest pain, palpitations, diaphoresis, PND, orthopnea or pitting edema of the lower extremities. No polyuria, polydipsia, polyphagia or blurred vision.   Lab work: CBC showed white count 17.4, hemoglobin 13.0 g/dL platelets 811.  Serum pregnancy test was negative.  Normal lipase and lactic acid.  CMP showed a sodium 134 mmol/L, glucose 122 and total bilirubin 1.6 mg/dL.  Renal function, the rest of the electrolytes and the rest of the hepatic functions were normal.  Imaging: CT abdomen/pelvis with contrast show acute diverticulitis stage I-II involving the distal descending colon and sigmoid colon junction milligram and residual fecal material throughout the colon without obstruction or constipation.  Status postcholecystectomy.  Prior bariatric gastric surgical procedure.  Small anterior abdominal wall umbilical hernia containing only fat.  ED course: Initial vital signs were temperature 99.5 F, pulse 127, respiration 18, BP  121/84 mmHg O2 sat 99% on room air.  The patient received fentanyl 50 mcg IVP, hydromorphone 1 mg IVP ondansetron  4 mg IVP, Zosyn 3.375 g IVPB and 1000 mL of normal saline bolus.  Review of Systems: As mentioned in the history of present illness. All other systems reviewed and are negative.  Past Medical History:  Diagnosis Date   Anxiety    Asthma    Chronic headaches    COVID-19    Gallstones    Gastritis    GERD (gastroesophageal reflux disease)    Hyperlipemia    no meds   Kidney stones    Past Surgical History:  Procedure Laterality Date   BARIATRIC SURGERY  05/17/2022   CHOLECYSTECTOMY  08/2004   COLONOSCOPY     KNEE SURGERY Left    Social History:  reports that she has never smoked. She has never used smokeless tobacco. She reports that she does not currently use alcohol. She reports that she does not currently use drugs.  Allergies  Allergen Reactions   Metformin And Related Diarrhea   Sertraline  Anxiety and Other (See Comments)    Pt states it made her crazy/worsened anxiety    Family History  Problem Relation Age of Onset   Diabetes Father    Heart failure Father    Heart attack Father    Prostate cancer Maternal Grandfather    Pancreatic cancer Maternal Grandfather    Colon cancer Maternal Grandfather    Breast cancer Maternal Aunt    Ovarian cancer Mother    Diabetes Maternal Grandmother    Kidney Stones Maternal Grandmother    Heart attack Paternal Grandfather    Heart attack Paternal Aunt    Diabetes Maternal Uncle    Asthma Son    Tics Neg Hx  Migraines Neg Hx    Esophageal cancer Neg Hx    Rectal cancer Neg Hx    Stomach cancer Neg Hx     Prior to Admission medications   Medication Sig Start Date End Date Taking? Authorizing Provider  cyclobenzaprine  (FLEXERIL ) 10 MG tablet Take 10 mg by mouth 3 (three) times daily as needed for muscle spasms. 04/04/21   [provider]  dicyclomine  (BENTYL ) 10 MG capsule Take 10 mg by mouth 3  (three) times daily as needed for spasms. 06/07/22   [provider]  fluticasone  (FLONASE ) 50 MCG/ACT nasal spray PLACE 1 SPRAY INTO BOTH NOSTRILS DAILY AS NEEDED FOR ALLERGIES OR RHINITIS. Patient not taking: Reported on 04/30/2023 05/29/21   Lawrance Presume, MD  gabapentin (NEURONTIN) 100 MG capsule Take 100 mg by mouth 3 (three) times daily as needed (for pain).    [provider]  ibuprofen  (IBU-200) 200 MG tablet Take 200-400 mg by mouth every 6 (six) hours as needed (for headaches- when not taking Tylenol ).    [provider]  ketoconazole  (NIZORAL ) 2 % shampoo Apply 1 Application topically 2 (two) times a week. 11/19/23   Lawrance Presume, MD  metroNIDAZOLE  (FLAGYL ) 500 MG tablet Take 1 tablet (500 mg total) by mouth 2 (two) times daily. 09/18/23   Lawrance Presume, MD  oxyCODONE -acetaminophen  (PERCOCET) 10-325 MG tablet Take 0.5-1 tablets by mouth every 8 (eight) hours as needed for pain.    [provider]  triamcinolone  cream (KENALOG ) 0.1 % Apply 1 Application topically daily. 10/26/23   Lawrance Presume, MD  TYLENOL  325 MG CAPS Take 325-650 mg by mouth every 6 (six) hours as needed (for headaches- when not taking Ibuprofen ).    [provider]  Vitamin D, Ergocalciferol, (DRISDOL) 1.25 MG (50000 UNIT) CAPS capsule Take 50,000 Units by mouth every Friday.    [provider]  ZYRTEC ALLERGY 10 MG tablet Take 10 mg by mouth daily as needed for allergies or rhinitis.    [provider]    Physical Exam: Vitals:   12/31/23 0631 12/31/23 0735 12/31/23 0830 12/31/23 0853  BP: 121/84 115/67 114/71   Pulse: (!) 127 (!) 106 (!) 101   Resp: 18 18 18    Temp: 99.5 F (37.5 C)   99.3 F (37.4 C)  TempSrc: Oral   Oral  SpO2: 99% 99% 98%   Weight: 97.5 kg     Height: 5' 9 (1.753 m)      Physical Exam Vitals and nursing note reviewed.  Constitutional:      General: She is awake. She is not in acute distress.     Appearance: She is obese. She is ill-appearing.  HENT:     Head: Normocephalic.     Nose: No rhinorrhea.     Mouth/Throat:     Mouth: Mucous membranes are dry.   Eyes:     General: No scleral icterus.    Pupils: Pupils are equal, round, and reactive to light.   Neck:     Vascular: No JVD.   Cardiovascular:     Rate and Rhythm: Normal rate and regular rhythm.     Heart sounds: S1 normal and S2 normal.  Pulmonary:     Effort: Pulmonary effort is normal.     Breath sounds: Normal breath sounds. No wheezing, rhonchi or rales.  Abdominal:     General: Bowel sounds are normal.     Palpations: Abdomen is soft.     Tenderness: There  is abdominal tenderness in the left lower quadrant. There is guarding. There is no right CVA tenderness or left CVA tenderness.   Musculoskeletal:     Cervical back: Neck supple.     Right lower leg: No edema.     Left lower leg: No edema.   Skin:    General: Skin is warm and dry.   Neurological:     General: No focal deficit present.     Mental Status: She is alert and oriented to person, place, and time.   Psychiatric:        Mood and Affect: Mood normal.        Behavior: Behavior normal. Behavior is cooperative.     Data Reviewed:  Results are pending, will review when available.  EKG: Vent. rate 124 BPM PR interval 172 ms QRS duration 78 ms QT/QTcB 286/411 ms P-R-T axes 79 84 -12 Sinus tachycardia Anteroseptal infarct, old Borderline T abnormalities, inferior leads  Assessment and Plan: Principal Problem:   Acute diverticulitis IP/MedSurg. Continue IV fluids. Keep n.p.o. for now. Analgesics as needed. Antiemetics as needed. Pantoprazole 40 mg IVP x 1. Continue Zosyn 3.375 g IVPB every 8 hours. Follow CBC, CMP in AM.  Active Problems:   Mild intermittent asthma Albuterol  neb or MDI as needed.    Gastroesophageal reflux disease Currently not taking omeprazole .    Hyperlipidemia Currently not on therapy. Lifestyle  modifications might be beneficial. Follow-up with primary care provider.    OSA (obstructive sleep apnea) Does not use CPAP. Declined to use CPAP in the hospital.    Class 1 obesity Current BMI 31.75 kg/m. Would benefit from lifestyle modifications. Follow-up closely with PCP.     Advance Care Planning:   Code Status: Full Code   Consults:   Family Communication:   Severity of Illness: The appropriate patient status for this patient is INPATIENT. Inpatient status is judged to be reasonable and necessary in order to provide the required intensity of service to ensure the patient's safety. The patient's presenting symptoms, physical exam findings, and initial radiographic and laboratory data in the context of their chronic comorbidities is felt to place them at high risk for further clinical deterioration. Furthermore, it is not anticipated that the patient will be medically stable for discharge from the hospital within 2 midnights of admission.   * I certify that at the point of admission it is my clinical judgment that the patient will require inpatient hospital care spanning beyond 2 midnights from the point of admission due to high intensity of service, high risk for further deterioration and high frequency of surveillance required.*  Author: Danice Dural, MD 12/31/2023 9:17 AM  For on call review [email or web address].   This document was prepared using Dragon voice recognition software and may contain some unintended transcription errors.

## 2023-12-31 NOTE — ED Provider Notes (Signed)
 [facility] EMERGENCY DEPARTMENT AT [facility] ([facility]) Provider Note   CSN: [id number] Arrival date & time: 12/31/23  4259     Patient presents with: Abdominal Pain   Amy Mendoza is a 43 y.o. female.   43 year old female presenting with abdominal pain.  Patient reports that symptoms began yesterday around 7 PM, no known inciting event/injury.  Patient was able to eat/drink yesterday as normal.  Describes her abdominal pain as 10 out of 10 with associated nausea but no vomiting.  Patient has history of sleeve gastrectomy, cholecystectomy, endometrial ablation with tubal ligation.  Last bowel movement was yesterday and was normal.  Denies fever, diarrhea, hematochezia/melena, dysuria/hematuria, chest pain, shortness of breath.  Patient does not have a regular menstrual cycle s/p endometrial ablation with tubal ligation but did report mild vaginal bleeding this morning when she wiped after using the restroom, recently seen by her PCP/OBGYN for DUB and diagnosed with uterine fibroids.  She took ibuprofen  with minimal relief yesterday but none today.   Abdominal Pain Associated symptoms: nausea        Prior to Admission medications   Medication Sig Start Date End Date Taking? Authorizing Provider  cyclobenzaprine  (FLEXERIL ) 10 MG tablet Take 10 mg by mouth 3 (three) times daily as needed for muscle spasms. 04/04/21   [provider]  dicyclomine  (BENTYL ) 10 MG capsule Take 10 mg by mouth 3 (three) times daily as needed for spasms. 06/07/22   [provider]  fluticasone  (FLONASE ) 50 MCG/ACT nasal spray PLACE 1 SPRAY INTO BOTH NOSTRILS DAILY AS NEEDED FOR ALLERGIES OR RHINITIS. Patient not taking: Reported on 04/30/2023 05/29/21   Lawrance Presume, MD  gabapentin (NEURONTIN) 100 MG capsule Take 100 mg by mouth 3 (three) times daily as needed (for pain).    [provider]  ibuprofen  (IBU-200) 200 MG tablet Take 200-400 mg by mouth every 6 (six) hours as  needed (for headaches- when not taking Tylenol ).    [provider]  ketoconazole  (NIZORAL ) 2 % shampoo Apply 1 Application topically 2 (two) times a week. 11/19/23   Lawrance Presume, MD  metroNIDAZOLE  (FLAGYL ) 500 MG tablet Take 1 tablet (500 mg total) by mouth 2 (two) times daily. 09/18/23   Lawrance Presume, MD  oxyCODONE -acetaminophen  (PERCOCET) 10-325 MG tablet Take 0.5-1 tablets by mouth every 8 (eight) hours as needed for pain.    [provider]  triamcinolone  cream (KENALOG ) 0.1 % Apply 1 Application topically daily. 10/26/23   Lawrance Presume, MD  TYLENOL  325 MG CAPS Take 325-650 mg by mouth every 6 (six) hours as needed (for headaches- when not taking Ibuprofen ).    [provider]  Vitamin D, Ergocalciferol, (DRISDOL) 1.25 MG (50000 UNIT) CAPS capsule Take 50,000 Units by mouth every Friday.    [provider]  ZYRTEC ALLERGY 10 MG tablet Take 10 mg by mouth daily as needed for allergies or rhinitis.    [provider]    Allergies: Metformin and related and Sertraline     Review of Systems  Gastrointestinal:  Positive for abdominal pain and nausea.    Updated Vital Signs  Vitals:   12/31/23 0631 12/31/23 0735  BP: 121/84 115/67  Pulse: (!) 127 (!) 106  Resp: 18 18  Temp: 99.5 F (37.5 C)   TempSrc: Oral   SpO2: 99% 99%  Weight: 97.5 kg   Height: 5' 9 (1.753 m)      Physical Exam Vitals and nursing note reviewed.  HENT:  Head: Normocephalic.   Eyes:     Extraocular Movements: Extraocular movements intact.     Pupils: Pupils are equal, round, and reactive to light.    Cardiovascular:     Rate and Rhythm: Regular rhythm. Tachycardia present.  Pulmonary:     Effort: Pulmonary effort is normal.     Breath sounds: Normal breath sounds.  Abdominal:     Palpations: Abdomen is soft.     Tenderness: There is abdominal tenderness (generalized). There is guarding.   Musculoskeletal:     Right lower leg: No edema.      Left lower leg: No edema.     Comments: Moves all extremities spontaneously without difficulty   Skin:    General: Skin is warm and dry.   Neurological:     Mental Status: She is alert and oriented to person, place, and time.     (all labs ordered are listed, but only abnormal results are displayed) Labs Reviewed  COMPREHENSIVE METABOLIC PANEL WITH GFR - Abnormal; Notable for the following components:      Result Value   Sodium 134 (*)    Glucose, Bld 122 (*)    Total Bilirubin 1.6 (*)    All other components within normal limits  CBC - Abnormal; Notable for the following components:   WBC 17.4 (*)    All other components within normal limits  CULTURE, BLOOD (ROUTINE X 2)  CULTURE, BLOOD (ROUTINE X 2)  LIPASE, BLOOD  HCG, SERUM, QUALITATIVE  URINALYSIS, ROUTINE W REFLEX MICROSCOPIC  I-STAT CG4 LACTIC ACID, ED  I-STAT CG4 LACTIC ACID, ED    EKG: EKG Interpretation Date/Time:  Monday December 31 2023 06:43:47 EDT Ventricular Rate:  124 PR Interval:  172 QRS Duration:  78 QT Interval:  286 QTC Calculation: 411 R Axis:   84  Text Interpretation: Sinus tachycardia Anteroseptal infarct, old Borderline T abnormalities, inferior leads When compared with ECG of 02/11/2020, No significant change was found Confirmed by Alissa April ([id number]) on 12/31/2023 6:55:32 AM  Radiology: No results found.   .Critical Care  Performed by: Kendrick Pax, PA-C Authorized by: Kendrick Pax, PA-C   Critical care provider statement:    Critical care time (minutes):  42   Critical care time was exclusive of:  Separately billable procedures and treating other patients and teaching time   Critical care was necessary to treat or prevent imminent or life-threatening deterioration of the following conditions: diverticulitis.   Critical care was time spent personally by me on the following activities:  Ordering and performing treatments and interventions, development of treatment plan with patient  or surrogate, ordering and review of laboratory studies, discussions with consultants, ordering and review of radiographic studies, evaluation of patient's response to treatment, re-evaluation of patient's condition, examination of patient and review of old charts   I assumed direction of critical care for this patient from another provider in my specialty: no     Care discussed with: admitting provider      Medications Ordered in the ED  piperacillin-tazobactam (ZOSYN) IVPB 3.375 g (3.375 g Intravenous New Bag/Given 12/31/23 0851)  fentaNYL (SUBLIMAZE) injection 50 mcg (50 mcg Intravenous Given 12/31/23 0731)  ondansetron  (ZOFRAN ) injection 4 mg (4 mg Intravenous Given 12/31/23 0732)  sodium chloride  0.9 % bolus 1,000 mL (1,000 mLs Intravenous New Bag/Given 12/31/23 0731)  iohexol  (OMNIPAQUE ) 300 MG/ML solution 100 mL (100 mLs Intravenous Contrast Given 12/31/23 0759)  HYDROmorphone (DILAUDID) injection 1 mg (1 mg Intravenous Given 12/31/23 0906)  Medical Decision Making This patient presents to the ED for concern of abdominal pain, this involves an extensive number of treatment options, and is a complaint that carries with it a high risk of complications and morbidity.  The differential diagnosis includes diverticulitis, PUD, pancreatitis, appendicitis, SBO.   Co morbidities that complicate the patient evaluation  Obesity of phentermine, DUB   Additional history obtained:  Additional history obtained from record review External records from outside source obtained and reviewed including PCP/OBGYN notes   Lab Tests:  I Ordered, and personally interpreted labs.  The pertinent results include: CBC notable for leukocytosis of 17.4.  CMP notable for mild hyponatremia with sodium of 134, hyperglycemia at 122, elevated total bilirubin of 1.6.  Lipase within normal limits.  Serum hCG negative.  Lactic 0.8.   Imaging Studies ordered:  I ordered imaging  studies including CT abdomen/pelvis with contrast  I independently visualized and interpreted imaging which showed *Acute diverticulitis stage 1-2) involving the distal descending colon sigmoid colon junction. *Moderate amount of residual fecal material throughout the colon without obstruction or constipation. *Status post cholecystectomy. *Prior bariatric gastric surgical procedure. *Small anterior abdominal wall umbilical hernia containing fat only.  I agree with the radiologist interpretation   Cardiac Monitoring: / EKG:  The patient was maintained on a cardiac monitor.  I personally viewed and interpreted the cardiac monitored which showed an underlying rhythm of: sinus tachycardia    Consultations Obtained:  I requested consultation with the hospitalist,  and discussed lab and imaging findings as well as pertinent plan - they recommend: Spoke with Dr. Bonita Bussing who recommends admission for IV antibiotics and pain management    Problem List / ED Course / Critical interventions / Medication management   I ordered medication including fentanyl  for pain, pain was relieved for a short period of time but has since returned, I ordered medication including Dilaudid for additional pain management. I ordered medication including IV Zosyn for diverticulitis.  Reevaluation of the patient after these medicines showed that the patient improved I have reviewed the patients home medicines and have made adjustments as needed   Test / Admission - Considered:  Physical exam notable as above.  Patient's initial presentation with significant abdominal pain, tachycardia, and subsequent leukocytosis was concerning for sepsis given that she met SIRS criteria, however lactic reassuring as above.  CT imaging results notable for diverticulitis, started on IV Zosyn.  Pain is poorly controlled at this time, given this as well as above findings I do feel that patient is appropriate for hospital admission for  additional IV antibiotics and pain management.  I spoke with the hospitalist, see above for their recommendations.  Patient is appropriate for admission at this time.    Amount and/or Complexity of Data Reviewed Labs: ordered. Radiology: ordered.  Risk Prescription drug management. Decision regarding hospitalization.        Final diagnoses:  Diverticulitis  Leukocytosis, unspecified type    ED Discharge Orders     None          Adolm Ahumada 12/31/23 0920    Trish Furl, MD 01/01/24 1034

## 2024-01-01 DIAGNOSIS — K5792 Diverticulitis of intestine, part unspecified, without perforation or abscess without bleeding: Secondary | ICD-10-CM | POA: Diagnosis not present

## 2024-01-01 LAB — COMPREHENSIVE METABOLIC PANEL WITH GFR
ALT: 16 U/L (ref 0–44)
AST: 11 U/L — ABNORMAL LOW (ref 15–41)
Albumin: 3.2 g/dL — ABNORMAL LOW (ref 3.5–5.0)
Alkaline Phosphatase: 59 U/L (ref 38–126)
Anion gap: 9 (ref 5–15)
BUN: 9 mg/dL (ref 6–20)
CO2: 25 mmol/L (ref 22–32)
Calcium: 8.4 mg/dL — ABNORMAL LOW (ref 8.9–10.3)
Chloride: 103 mmol/L (ref 98–111)
Creatinine, Ser: 0.81 mg/dL (ref 0.44–1.00)
GFR, Estimated: >60 mL/min (ref >60)
Glucose, Bld: 101 mg/dL — ABNORMAL HIGH (ref 70–99)
Potassium: 3.4 mmol/L — ABNORMAL LOW (ref 3.5–5.1)
Sodium: 137 mmol/L (ref 135–145)
Total Bilirubin: 2.2 mg/dL — ABNORMAL HIGH (ref 0.0–1.2)
Total Protein: 7 g/dL (ref 6.5–8.1)

## 2024-01-01 LAB — CBC
HCT: 37.8 % (ref 36.0–46.0)
Hemoglobin: 12.1 g/dL (ref 12.0–15.0)
MCH: 28.5 pg (ref 26.0–34.0)
MCHC: 32 g/dL (ref 30.0–36.0)
MCV: 89.2 fL (ref 80.0–100.0)
Platelets: 217 10*3/uL (ref 150–400)
RBC: 4.24 MIL/uL (ref 3.87–5.11)
RDW: 12.7 % (ref 11.5–15.5)
WBC: 17.5 10*3/uL — ABNORMAL HIGH (ref 4.0–10.5)
nRBC: 0 % (ref 0.0–0.2)

## 2024-01-01 LAB — HIV ANTIBODY (ROUTINE TESTING W REFLEX): HIV Screen 4th Generation wRfx: NONREACTIVE

## 2024-01-01 MED ORDER — NITROGLYCERIN 0.4 MG SL SUBL: 0.4000 mg | SUBLINGUAL_TABLET | SUBLINGUAL | Status: DC | PRN

## 2024-01-01 MED ORDER — ALUM & MAG HYDROXIDE-SIMETH 200-200-20 MG/5ML PO SUSP: 15.0000 mL | ORAL | Status: DC | PRN

## 2024-01-01 MED ORDER — PANTOPRAZOLE SODIUM 40 MG IV SOLR
40.0000 mg | Freq: Every day | INTRAVENOUS | Status: DC
  Administered 2024-01-01: 40 mg via INTRAVENOUS
  Filled 2024-01-01: qty 10

## 2024-01-01 MED ORDER — POTASSIUM CHLORIDE CRYS ER 20 MEQ PO TBCR
40.0000 meq | EXTENDED_RELEASE_TABLET | Freq: Once | ORAL | Status: AC
  Administered 2024-01-01: 40 meq via ORAL
  Filled 2024-01-01: qty 2

## 2024-01-01 MED ORDER — SODIUM CHLORIDE 0.9 % IV SOLN: INTRAVENOUS | Status: AC

## 2024-01-01 NOTE — Plan of Care (Signed)

## 2024-01-01 NOTE — Progress Notes (Signed)
 PROGRESS NOTE    Amy Mendoza  ZOX:[id number] DOB: [birth year] DOA: 12/31/2023 PCP: Lawrance Presume, MD   Brief Narrative:  43 y.o. female with medical history significant of anxiety, asthma, chronic headaches, COVID-19, cholelithiasis, gastritis, GERD, hyperlipidemia, nephrolithiasis presented with worsening abdominal pain and nausea.  On presentation, WBC was 17.4 with normal lipase and lactic acid normal renal and hepatic functions. CT abdomen/pelvis with contrast showed acute diverticulitis involving the distal descending colon and sigmoid colon junction.  She was started on IV antibiotics and fluids.  Assessment & Plan:   Acute diverticulitis - Imaging as above.  Continue Zosyn.  Continue IV fluids, and antiemetics and analgesics as needed -Currently NPO.  Will start her on clear liquid diet.  Advance diet as tolerated  Leukocytosis - Still significant.  Monitor  Hypokalemia -Replace.  Repeat a.m. labs  Hyponatremia - Improved  Mildly elevated total bilirubin - Questionable cause.  Monitor  Mild intermittent asthma - Stable.  Currently on room air.   Hyperlipidemia - Currently not on therapy.  Outpatient follow-up with PCP  Obesity class I - Outpatient follow-up  OSA -Does not use CPAP at home    DVT prophylaxis: Lovenox  Code Status: Full Family Communication: None at bedside Disposition Plan: Status is: Inpatient Remains inpatient appropriate because: Of severity of illness  Consultants: None  Procedures: None  Antimicrobials:  Anti-infectives (From admission, onward)    Start     Dose/Rate Route Frequency Ordered Stop   12/31/23 1700  piperacillin-tazobactam (ZOSYN) IVPB 3.375 g        3.375 g 12.5 mL/hr over 240 Minutes Intravenous Every 8 hours 12/31/23 1359     12/31/23 0900  piperacillin-tazobactam (ZOSYN) IVPB 3.375 g        3.375 g 100 mL/hr over 30 Minutes Intravenous  Once 12/31/23 0846 12/31/23 0939        Subjective: Patient  seen and examined at bedside.  Still complains of abdominal pain and some nausea.  Wants to try some clear liquid diet.  No fever, chest pain or vomiting reported.  Objective: Vitals:   12/31/23 1925 12/31/23 2342 01/01/24 0346 01/01/24 0430  BP: 121/68 120/65 117/66   Pulse:  (!) 105 (!) 121 (!) 120  Resp: 16 16 17    Temp: 99.2 F (37.3 C) 99.9 F (37.7 C) 99.2 F (37.3 C)   TempSrc: Oral Oral Oral   SpO2: 99% 94% 95%   Weight:      Height:        Intake/Output Summary (Last 24 hours) at 01/01/2024 0811 Last data filed at 01/01/2024 0600 Gross per 24 hour  Intake 1187.31 ml  Output 1300 ml  Net -112.69 ml   Filed Weights   12/31/23 0631  Weight: 97.5 kg    Examination:  General exam: Appears calm and comfortable.  On room air. Respiratory system: Bilateral decreased breath sounds at bases, no wheezing Cardiovascular system: S1 & S2 heard, Rate controlled Gastrointestinal system: Abdomen is obese, nondistended, soft and mildly tender.  Normal bowel sounds heard. Extremities: No cyanosis, clubbing, edema  Central nervous system: Alert and oriented. No focal neurological deficits. Moving extremities Skin: No rashes, lesions or ulcers Psychiatry: Flat affect.  Not agitated.    Data Reviewed: I have personally reviewed following labs and imaging studies  CBC: Recent Labs  Lab 12/31/23 0640 01/01/24 0500  WBC 17.4* 17.5*  HGB 13.0 12.1  HCT 39.1 37.8  MCV 87.5 89.2  PLT 233 217   Basic Metabolic Panel: Recent  Labs  Lab 12/31/23 0640 01/01/24 0500  NA 134* 137  K 3.8 3.4*  CL 102 103  CO2 24 25  GLUCOSE 122* 101*  BUN 9 9  CREATININE 0.75 0.81  CALCIUM 8.9 8.4*   GFR: Estimated Creatinine Clearance: 113.6 mL/min (by C-G formula based on SCr of 0.81 mg/dL). Liver Function Tests: Recent Labs  Lab 12/31/23 0640 01/01/24 0500  AST 16 11*  ALT 18 16  ALKPHOS 57 59  BILITOT 1.6* 2.2*  PROT 7.3 7.0  ALBUMIN 3.5 3.2*   Recent Labs  Lab  12/31/23 0640  LIPASE 26   No results for input(s): AMMONIA in the last 168 hours. Coagulation Profile: No results for input(s): INR, PROTIME in the last 168 hours. Cardiac Enzymes: No results for input(s): CKTOTAL, CKMB, CKMBINDEX, TROPONINI in the last 168 hours. BNP (last 3 results) No results for input(s): PROBNP in the last 8760 hours. HbA1C: No results for input(s): HGBA1C in the last 72 hours. CBG: No results for input(s): GLUCAP in the last 168 hours. Lipid Profile: No results for input(s): CHOL, HDL, LDLCALC, TRIG, CHOLHDL, LDLDIRECT in the last 72 hours. Thyroid  Function Tests: No results for input(s): TSH, T4TOTAL, FREET4, T3FREE, THYROIDAB in the last 72 hours. Anemia Panel: No results for input(s): VITAMINB12, FOLATE, FERRITIN, TIBC, IRON, RETICCTPCT in the last 72 hours. Sepsis Labs: Recent Labs  Lab 12/31/23 0755  LATICACIDVEN 0.8    No results found for this or any previous visit (from the past 240 hours).       Radiology Studies: CT ABDOMEN PELVIS W CONTRAST Result Date: 12/31/2023 CLINICAL DATA:  Abdominal pain EXAM: CT ABDOMEN AND PELVIS WITH CONTRAST TECHNIQUE: Multidetector CT imaging of the abdomen and pelvis was performed using the standard protocol following bolus administration of intravenous contrast. RADIATION DOSE REDUCTION: This exam was performed according to the departmental dose-optimization program which includes automated exposure control, adjustment of the mA and/or kV according to patient size and/or use of iterative reconstruction technique. CONTRAST:  OMNIPAQUE  IOHEXOL  300 MG/ML  SOLN COMPARISON:  October 02, 2022 FINDINGS: Lower chest: No infiltrates or consolidations, no pleural effusions Hepatobiliary: No focal liver abnormality is seen. Status post cholecystectomy. No biliary dilatation. Prior bariatric gastric surgical procedure. Small anterior abdominal wall umbilical hernia  containing fat only. Pancreas: Pancreas normal size. No masses calcifications or inflammatory changes. Spleen: Spleen normal size.  No masses. Adrenals/Urinary Tract: Adrenal glands are normal size. Follow-up recommended. Kidneys are normal. No masses calcifications or hydronephrosis Stomach/Bowel: No small or large bowel obstruction Inflammatory changes involving the distal descending colon sigmoid colon junction with inflammatory changes of the pericolonic soft tissues and inflammatory changes of the adjacent small bowel loop anterior to the sigmoid colon findings consistent with a acute diverticulitis stage 1-2) (0 4) Moderate amount of residual fecal material throughout the colon without obstruction or constipation. Vascular/Lymphatic: No significant vascular findings are present. No enlarged abdominal or pelvic lymph nodes. Reproductive: .  No masses. Bladder unremarkable. Other: Anterior abdominal wall unremarkable without evidence of umbilical or inguinal hernias Musculoskeletal: Visualized portion of the thoracolumbar spine and pelvic structures grossly unremarkable without evidence of fracture bony abnormalities or soft tissue masses. IMPRESSION: *Acute diverticulitis stage 1-2) involving the distal descending colon sigmoid colon junction. *Moderate amount of residual fecal material throughout the colon without obstruction or constipation. *Status post cholecystectomy. *Prior bariatric gastric surgical procedure. *Small anterior abdominal wall umbilical hernia containing fat only. Electronically Signed   By: Fredrich Jefferson M.D.   On: 12/31/2023  08:32        Scheduled Meds:  enoxaparin  (LOVENOX ) injection  40 mg Subcutaneous Q24H   Continuous Infusions:  sodium chloride  40 mL/hr at 01/01/24 0504   piperacillin-tazobactam (ZOSYN)  IV 3.375 g (01/01/24 0751)          Audria Leather, MD [facility] 01/01/2024, 8:11 AM

## 2024-01-01 NOTE — Progress Notes (Signed)
   01/01/24 1051  TOC Brief Assessment  Insurance and Status Reviewed  Patient has primary care physician Yes  Home environment has been reviewed resides in an apartment  Prior level of function: Independent  Prior/Current Home Services No current home services  Social Drivers of Health Review SDOH reviewed no interventions necessary  Readmission risk has been reviewed Yes  Transition of care needs no transition of care needs at this time

## 2024-01-01 NOTE — Plan of Care (Signed)
   Problem: Education: Goal: Knowledge of General Education information will improve Description Including pain rating scale, medication(s)/side effects and non-pharmacologic comfort measures Outcome: Progressing   Problem: Health Behavior/Discharge Planning: Goal: Ability to manage health-related needs will improve Outcome: Progressing

## 2024-01-02 DIAGNOSIS — K5792 Diverticulitis of intestine, part unspecified, without perforation or abscess without bleeding: Secondary | ICD-10-CM | POA: Diagnosis not present

## 2024-01-02 LAB — CBC WITH DIFFERENTIAL/PLATELET
Abs Immature Granulocytes: 0.08 10*3/uL — ABNORMAL HIGH (ref 0.00–0.07)
Basophils Absolute: 0 10*3/uL (ref 0.0–0.1)
Basophils Relative: 0 %
Eosinophils Absolute: 0.1 10*3/uL (ref 0.0–0.5)
Eosinophils Relative: 1 %
HCT: 37.7 % (ref 36.0–46.0)
Hemoglobin: 11.9 g/dL — ABNORMAL LOW (ref 12.0–15.0)
Immature Granulocytes: 1 %
Lymphocytes Relative: 11 %
Lymphs Abs: 1.4 10*3/uL (ref 0.7–4.0)
MCH: 28.6 pg (ref 26.0–34.0)
MCHC: 31.6 g/dL (ref 30.0–36.0)
MCV: 90.6 fL (ref 80.0–100.0)
Monocytes Absolute: 0.8 10*3/uL (ref 0.1–1.0)
Monocytes Relative: 6 %
Neutro Abs: 11.3 10*3/uL — ABNORMAL HIGH (ref 1.7–7.7)
Neutrophils Relative %: 81 %
Platelets: 211 10*3/uL (ref 150–400)
RBC: 4.16 MIL/uL (ref 3.87–5.11)
RDW: 12.5 % (ref 11.5–15.5)
WBC: 13.8 10*3/uL — ABNORMAL HIGH (ref 4.0–10.5)
nRBC: 0 % (ref 0.0–0.2)

## 2024-01-02 LAB — C-REACTIVE PROTEIN: CRP: 23.2 mg/dL — ABNORMAL HIGH (ref <1.0)

## 2024-01-02 LAB — COMPREHENSIVE METABOLIC PANEL WITH GFR
ALT: 14 U/L (ref 0–44)
AST: 12 U/L — ABNORMAL LOW (ref 15–41)
Albumin: 2.8 g/dL — ABNORMAL LOW (ref 3.5–5.0)
Alkaline Phosphatase: 61 U/L (ref 38–126)
Anion gap: 7 (ref 5–15)
BUN: 5 mg/dL — ABNORMAL LOW (ref 6–20)
CO2: 25 mmol/L (ref 22–32)
Calcium: 8.3 mg/dL — ABNORMAL LOW (ref 8.9–10.3)
Chloride: 101 mmol/L (ref 98–111)
Creatinine, Ser: 0.55 mg/dL (ref 0.44–1.00)
GFR, Estimated: >60 mL/min (ref >60)
Glucose, Bld: 96 mg/dL (ref 70–99)
Potassium: 3.4 mmol/L — ABNORMAL LOW (ref 3.5–5.1)
Sodium: 133 mmol/L — ABNORMAL LOW (ref 135–145)
Total Bilirubin: 1.5 mg/dL — ABNORMAL HIGH (ref 0.0–1.2)
Total Protein: 6.7 g/dL (ref 6.5–8.1)

## 2024-01-02 LAB — MAGNESIUM: Magnesium: 1.8 mg/dL (ref 1.7–2.4)

## 2024-01-02 MED ORDER — MORPHINE SULFATE (PF) 4 MG/ML IV SOLN: 4.0000 mg | INTRAVENOUS | Status: DC | PRN

## 2024-01-02 MED ORDER — HYDROCODONE-ACETAMINOPHEN 5-325 MG PO TABS
1.0000 | ORAL_TABLET | ORAL | Status: DC | PRN
  Administered 2024-01-02 – 2024-01-07 (×9): 1 via ORAL
  Filled 2024-01-02 (×9): qty 1

## 2024-01-02 MED ORDER — PANTOPRAZOLE SODIUM 40 MG PO TBEC
40.0000 mg | DELAYED_RELEASE_TABLET | Freq: Every day | ORAL | Status: DC
  Administered 2024-01-02 – 2024-01-08 (×6): 40 mg via ORAL
  Filled 2024-01-02 (×7): qty 1

## 2024-01-02 MED ORDER — MORPHINE SULFATE (PF) 2 MG/ML IV SOLN
2.0000 mg | INTRAVENOUS | Status: DC | PRN
  Administered 2024-01-02 – 2024-01-04 (×2): 2 mg via INTRAVENOUS
  Filled 2024-01-02: qty 1
  Filled 2024-01-02: qty 2
  Filled 2024-01-02 (×3): qty 1

## 2024-01-02 MED ORDER — POTASSIUM CHLORIDE CRYS ER 20 MEQ PO TBCR
40.0000 meq | EXTENDED_RELEASE_TABLET | Freq: Once | ORAL | Status: AC
  Administered 2024-01-02: 40 meq via ORAL
  Filled 2024-01-02: qty 2

## 2024-01-02 MED ORDER — METHOCARBAMOL 500 MG PO TABS
500.0000 mg | ORAL_TABLET | Freq: Three times a day (TID) | ORAL | Status: DC | PRN
  Administered 2024-01-02 – 2024-01-07 (×3): 500 mg via ORAL
  Filled 2024-01-02 (×3): qty 1

## 2024-01-02 MED ORDER — SODIUM CHLORIDE 0.9 % IV SOLN: INTRAVENOUS | Status: AC

## 2024-01-02 NOTE — Hospital Course (Signed)
 Amy Mendoza is a 43 y.o. female with a history of anxiety, asthma, chronic headache, COVID-19, cholelithiasis, gastritis, GERD, hyperlipidemia, nephrolithiasis.  Patient presented secondary to abdominal pain and was found to have evidence of acute diverticulitis.  Empiric antibiotics and bowel rest initiated on admission.  Patient's symptoms managed with analgesics as needed.  Diet advanced as tolerated.

## 2024-01-02 NOTE — Progress Notes (Signed)
 PROGRESS NOTE    MARIEM SKOLNICK  ZOX:[id number] DOB: August 30, 1980 DOA: 12/31/2023 PCP: Lawrance Presume, MD   Brief Narrative: GWENETTA DEVOS is a 43 y.o. female with a history of anxiety, asthma, chronic headache, COVID-19, cholelithiasis, gastritis, GERD, hyperlipidemia, nephrolithiasis.  Patient presented secondary to abdominal pain and was found to have evidence of acute diverticulitis.  Empiric antibiotics and bowel rest initiated on admission.  Patient's symptoms managed with analgesics as needed.  Diet advanced as tolerated.   Assessment and Plan:  Acute diverticulitis Patient with pain and leukocytosis.  CT abdomen/pelvis significant for acute diverticulitis involving the distal descending colon and sigmoid colon junction with associated moderate amount of residual fecal material throughout the colon without obstruction or constipation.  Patient started empirically on Zosyn IV for management in addition to bowel rest and analgesics.  Diet advanced.  Leukocytosis improving. - Continue Zosyn IV - Advance to soft diet  Hypokalemia Mild with potassium of 3.4. - Potassium supplementation  Hyponatremia Mild with a sodium of 133.  Asymptomatic.  Elevated bilirubin Mildly elevated at 2.2 peak.  Unclear etiology.  Trended down to 1.5 today.  Mild intermittent asthma Noted.  Asymptomatic.  Hyperlipidemia Noted.  Patient is not on medication therapy as an outpatient.  OSA Patient is nonadherent with CPAP as an outpatient.  Obesity, class I Estimated body mass index is 31.75 kg/m as calculated from the following:   Height as of this encounter: 5' 9 (1.753 m).   Weight as of this encounter: 97.5 kg.   DVT prophylaxis: Lovenox  Code Status:   Code Status: Full Code Family Communication: None at bedside Disposition Plan: Discharge home likely in 24 hours if remains afebrile and continues to tolerate diet   Consultants:  None  Procedures:   None  Antimicrobials: Zosyn IV    Subjective: Still with abdominal pain. No bowel movement. No emesis. Tolerating diet.  Febrile last night with Tmax of 101 F.  Objective: BP 134/74 (BP Location: Left Arm)   Pulse 99   Temp 99.5 F (37.5 C) (Oral)   Resp 18   Ht 5' 9 (1.753 m)   Wt 97.5 kg   SpO2 93%   BMI 31.75 kg/m   Examination:  General exam: Appears calm and comfortable Respiratory system: Respiratory effort normal. Gastrointestinal system: Abdomen is nondistended, soft and tender in LUQ/LLQ.  Central nervous system: Alert and oriented. No focal neurological deficits. Musculoskeletal: No edema. No calf tenderness Psychiatry: Judgement and insight appear normal. Mood & affect appropriate.    Data Reviewed: I have personally reviewed following labs and imaging studies  CBC Lab Results  Component Value Date   WBC 13.8 (H) 01/02/2024   RBC 4.16 01/02/2024   HGB 11.9 (L) 01/02/2024   HCT 37.7 01/02/2024   MCV 90.6 01/02/2024   MCH 28.6 01/02/2024   PLT 211 01/02/2024   MCHC 31.6 01/02/2024   RDW 12.5 01/02/2024   LYMPHSABS 1.4 01/02/2024   MONOABS 0.8 01/02/2024   EOSABS 0.1 01/02/2024   BASOSABS 0.0 01/02/2024     Last metabolic panel Lab Results  Component Value Date   NA 133 (L) 01/02/2024   K 3.4 (L) 01/02/2024   CL 101 01/02/2024   CO2 25 01/02/2024   BUN 5 (L) 01/02/2024   CREATININE 0.55 01/02/2024   GLUCOSE 96 01/02/2024   GFRNONAA >60 01/02/2024   GFRAA >60 02/03/2020   CALCIUM 8.3 (L) 01/02/2024   PHOS 3.1 07/08/2019   PROT 6.7 01/02/2024   ALBUMIN  2.8 (L) 01/02/2024   BILITOT 1.5 (H) 01/02/2024   ALKPHOS 61 01/02/2024   AST 12 (L) 01/02/2024   ALT 14 01/02/2024   ANIONGAP 7 01/02/2024    GFR: Estimated Creatinine Clearance: 115 mL/min (by C-G formula based on SCr of 0.55 mg/dL).  Recent Results (from the past 240 hours)  Blood Culture (routine x 2)     Status: None (Preliminary result)   Collection Time: 12/31/23  7:12 AM    Specimen: BLOOD  Result Value Ref Range Status   Specimen Description   Final    BLOOD RIGHT ANTECUBITAL Performed at [facility], [address], [place], [place] [address]    Special Requests   Final    BOTTLES DRAWN AEROBIC AND ANAEROBIC Blood Culture results may not be optimal due to an inadequate volume of blood received in culture bottles Performed at [facility], [address] [place], [place] [address]    Culture   Final    NO GROWTH 2 DAYS Performed at [facility], [address], [place], [place] [address]    Report Status PENDING  Incomplete  Blood Culture (routine x 2)     Status: None (Preliminary result)   Collection Time: 12/31/23  8:04 AM   Specimen: BLOOD  Result Value Ref Range Status   Specimen Description   Final    BLOOD LEFT ANTECUBITAL Performed at [facility], [address], [place], [place] [address]    Special Requests   Final    BOTTLES DRAWN AEROBIC AND ANAEROBIC Blood Culture results may not be optimal due to an inadequate volume of blood received in culture bottles Performed at [facility], [address], [place], [place] [address]    Culture   Final    NO GROWTH 2 DAYS Performed at [facility], [address], [place], [place] [address]    Report Status PENDING  Incomplete      Radiology Studies: No results found.    LOS: 2 days    Aneita Keens, MD [facility] 01/02/2024, 9:45 AM   If 7PM-7AM, please contact night-coverage [email or web address]

## 2024-01-02 NOTE — Plan of Care (Signed)
 ?  Problem: Clinical Measurements: ?Goal: Will remain free from infection ?Outcome: Progressing ?  ?Problem: Clinical Measurements: ?Goal: Diagnostic test results will improve ?Outcome: Progressing ?  ?

## 2024-01-03 DIAGNOSIS — K5792 Diverticulitis of intestine, part unspecified, without perforation or abscess without bleeding: Secondary | ICD-10-CM | POA: Diagnosis not present

## 2024-01-03 LAB — COMPREHENSIVE METABOLIC PANEL WITH GFR
ALT: 13 U/L (ref 0–44)
AST: 10 U/L — ABNORMAL LOW (ref 15–41)
Albumin: 2.7 g/dL — ABNORMAL LOW (ref 3.5–5.0)
Alkaline Phosphatase: 56 U/L (ref 38–126)
Anion gap: 10 (ref 5–15)
BUN: 6 mg/dL (ref 6–20)
CO2: 25 mmol/L (ref 22–32)
Calcium: 8.6 mg/dL — ABNORMAL LOW (ref 8.9–10.3)
Chloride: 105 mmol/L (ref 98–111)
Creatinine, Ser: 0.83 mg/dL (ref 0.44–1.00)
GFR, Estimated: >60 mL/min (ref >60)
Glucose, Bld: 80 mg/dL (ref 70–99)
Potassium: 3.5 mmol/L (ref 3.5–5.1)
Sodium: 140 mmol/L (ref 135–145)
Total Bilirubin: 1 mg/dL (ref 0.0–1.2)
Total Protein: 6.6 g/dL (ref 6.5–8.1)

## 2024-01-03 LAB — CBC
HCT: 38.1 % (ref 36.0–46.0)
Hemoglobin: 11.9 g/dL — ABNORMAL LOW (ref 12.0–15.0)
MCH: 28.6 pg (ref 26.0–34.0)
MCHC: 31.2 g/dL (ref 30.0–36.0)
MCV: 91.6 fL (ref 80.0–100.0)
Platelets: 230 10*3/uL (ref 150–400)
RBC: 4.16 MIL/uL (ref 3.87–5.11)
RDW: 12.3 % (ref 11.5–15.5)
WBC: 11.1 10*3/uL — ABNORMAL HIGH (ref 4.0–10.5)
nRBC: 0 % (ref 0.0–0.2)

## 2024-01-03 MED ORDER — POTASSIUM CHLORIDE CRYS ER 20 MEQ PO TBCR
40.0000 meq | EXTENDED_RELEASE_TABLET | Freq: Once | ORAL | Status: AC
  Administered 2024-01-03: 40 meq via ORAL
  Filled 2024-01-03: qty 2

## 2024-01-03 MED ORDER — POTASSIUM CHLORIDE CRYS ER 20 MEQ PO TBCR
40.0000 meq | EXTENDED_RELEASE_TABLET | Freq: Once | ORAL | Status: AC
  Filled 2024-01-03: qty 2

## 2024-01-03 MED ORDER — POLYETHYLENE GLYCOL 3350 17 G PO PACK
17.0000 g | PACK | Freq: Two times a day (BID) | ORAL | Status: DC
  Administered 2024-01-03 – 2024-01-04 (×2): 17 g via ORAL
  Filled 2024-01-03 (×2): qty 1

## 2024-01-03 NOTE — Plan of Care (Signed)
°  Problem: Clinical Measurements: Goal: Will remain free from infection Outcome: Progressing   Problem: Activity: Goal: Risk for activity intolerance will decrease Outcome: Progressing   Problem: Nutrition: Goal: Adequate nutrition will be maintained Outcome: Progressing

## 2024-01-03 NOTE — Progress Notes (Signed)
 PROGRESS NOTE    BRANTLEIGH MIFFLIN  [id number] DOB: 1980/09/09 DOA: 12/31/2023 PCP: Lawrance Presume, MD   Brief Narrative: Amy Mendoza is a 43 y.o. female with a history of anxiety, asthma, chronic headache, COVID-19, cholelithiasis, gastritis, GERD, hyperlipidemia, nephrolithiasis.  Patient presented secondary to abdominal pain and was found to have evidence of acute diverticulitis.  Empiric antibiotics and bowel rest initiated on admission.  Patient's symptoms managed with analgesics as needed.  Diet advanced as tolerated.   Assessment and Plan:  Acute diverticulitis Patient with pain and leukocytosis.  CT abdomen/pelvis significant for acute diverticulitis involving the distal descending colon and sigmoid colon junction with associated moderate amount of residual fecal material throughout the colon without obstruction or constipation.  Patient started empirically on Zosyn IV for management in addition to bowel rest and analgesics.  Diet advanced but patient had an episode of emesis; diet restricted to NPO.  Leukocytosis improving. - Continue Zosyn IV - Advance to a clear liquid diet again  Hypokalemia Mild with potassium of 3.4. Improved with potassium supplementation. Resolved.  Hyponatremia Mild with a sodium of 133.  Asymptomatic.  Elevated bilirubin Mildly elevated at 2.2 peak.  Unclear etiology.  Trended down to 1.0. Resolved.  Mild intermittent asthma Noted.  Asymptomatic.  Hyperlipidemia Noted.  Patient is not on medication therapy as an outpatient.  OSA Patient is nonadherent with CPAP as an outpatient.  Obesity, class I Estimated body mass index is 33.53 kg/m as calculated from the following:   Height as of this encounter: 5' 9 (1.753 m).   Weight as of this encounter: 103 kg.   DVT prophylaxis: Lovenox  Code Status:   Code Status: Full Code Family Communication: None at bedside Disposition Plan: Discharge home likely in 24 hours if remains  afebrile and continues to tolerate diet   Consultants:  None  Procedures:  None  Antimicrobials: Zosyn IV    Subjective: Patient feels better than yesterday. Abdominal pain is not as severe. Afebrile overnight.  Objective: BP (!) 147/77 (BP Location: Left Arm)   Pulse 84   Temp 98.4 F (36.9 C) (Oral)   Resp 17   Ht 5' 9 (1.753 m)   Wt 103 kg   SpO2 99%   BMI 33.53 kg/m   Examination:  General exam: Appears calm and comfortable Respiratory system: Respiratory effort normal. Gastrointestinal system: Abdomen is nondistended, soft and tender in LUQ/LLQ.  Central nervous system: Alert and oriented. No focal neurological deficits. Musculoskeletal: No edema. No calf tenderness Psychiatry: Judgement and insight appear normal. Mood & affect appropriate.    Data Reviewed: I have personally reviewed following labs and imaging studies  CBC Lab Results  Component Value Date   WBC 11.1 (H) 01/03/2024   RBC 4.16 01/03/2024   HGB 11.9 (L) 01/03/2024   HCT 38.1 01/03/2024   MCV 91.6 01/03/2024   MCH 28.6 01/03/2024   PLT 230 01/03/2024   MCHC 31.2 01/03/2024   RDW 12.3 01/03/2024   LYMPHSABS 1.4 01/02/2024   MONOABS 0.8 01/02/2024   EOSABS 0.1 01/02/2024   BASOSABS 0.0 01/02/2024     Last metabolic panel Lab Results  Component Value Date   NA 140 01/03/2024   K 3.5 01/03/2024   CL 105 01/03/2024   CO2 25 01/03/2024   BUN 6 01/03/2024   CREATININE 0.83 01/03/2024   GLUCOSE 80 01/03/2024   GFRNONAA >60 01/03/2024   GFRAA >60 02/03/2020   CALCIUM 8.6 (L) 01/03/2024   PHOS 3.1 07/08/2019  PROT 6.6 01/03/2024   ALBUMIN 2.7 (L) 01/03/2024   BILITOT 1.0 01/03/2024   ALKPHOS 56 01/03/2024   AST 10 (L) 01/03/2024   ALT 13 01/03/2024   ANIONGAP 10 01/03/2024    GFR: Estimated Creatinine Clearance: 113.9 mL/min (by C-G formula based on SCr of 0.83 mg/dL).  Recent Results (from the past 240 hours)  Blood Culture (routine x 2)     Status: None (Preliminary  result)   Collection Time: 12/31/23  7:12 AM   Specimen: BLOOD  Result Value Ref Range Status   Specimen Description   Final    BLOOD RIGHT ANTECUBITAL Performed at [facility], [address], [place], [place] [address]    Special Requests   Final    BOTTLES DRAWN AEROBIC AND ANAEROBIC Blood Culture results may not be optimal due to an inadequate volume of blood received in culture bottles Performed at [facility], [address], [place], [place] [address]    Culture   Final    NO GROWTH 3 DAYS Performed at [facility], [address], [place], [place] [address]    Report Status PENDING  Incomplete  Blood Culture (routine x 2)     Status: None (Preliminary result)   Collection Time: 12/31/23  8:04 AM   Specimen: BLOOD  Result Value Ref Range Status   Specimen Description   Final    BLOOD LEFT ANTECUBITAL Performed at [facility], [address], [place], [place] [address]    Special Requests   Final    BOTTLES DRAWN AEROBIC AND ANAEROBIC Blood Culture results may not be optimal due to an inadequate volume of blood received in culture bottles Performed at [facility], [address], [place], [place] [address]    Culture   Final    NO GROWTH 3 DAYS Performed at [facility], [address], [place], [place] [address]    Report Status PENDING  Incomplete      Radiology Studies: No results found.    LOS: 3 days    Aneita Keens, MD [facility] 01/03/2024, 9:11 AM   If 7PM-7AM, please contact night-coverage [email or web address]

## 2024-01-04 ENCOUNTER — Inpatient Hospital Stay (HOSPITAL_COMMUNITY)

## 2024-01-04 DIAGNOSIS — K567 Ileus, unspecified: Secondary | ICD-10-CM | POA: Diagnosis not present

## 2024-01-04 DIAGNOSIS — K5792 Diverticulitis of intestine, part unspecified, without perforation or abscess without bleeding: Secondary | ICD-10-CM | POA: Diagnosis not present

## 2024-01-04 MED ORDER — SODIUM CHLORIDE 0.45 % IV SOLN: INTRAVENOUS | Status: DC

## 2024-01-04 NOTE — Plan of Care (Signed)
   Problem: Elimination: Goal: Will not experience complications related to bowel motility Outcome: Progressing   Problem: Pain Managment: Goal: General experience of comfort will improve and/or be controlled Outcome: Progressing

## 2024-01-04 NOTE — Progress Notes (Signed)
 PROGRESS NOTE    KHADEJA ABT  [id number] DOB: 1981/05/13 DOA: 12/31/2023 PCP: Lawrance Presume, MD   Brief Narrative: Amy Mendoza is a 43 y.o. female with a history of anxiety, asthma, chronic headache, COVID-19, cholelithiasis, gastritis, GERD, hyperlipidemia, nephrolithiasis.  Patient presented secondary to abdominal pain and was found to have evidence of acute diverticulitis.  Empiric antibiotics and bowel rest initiated on admission.  Patient's symptoms managed with analgesics as needed.  Diet advanced as tolerated.   Assessment and Plan:  Acute diverticulitis Patient with pain and leukocytosis.  CT abdomen/pelvis significant for acute diverticulitis involving the distal descending colon and sigmoid colon junction with associated moderate amount of residual fecal material throughout the colon without obstruction or constipation.  Patient started empirically on Zosyn IV for management in addition to bowel rest and analgesics.  Diet advanced but patient had an episode of emesis; diet restricted to NPO.  Leukocytosis improving. Patient's abdominal pain and intermittent emesis/nausea persisting. Abdominal x-ray obtained which is concerning for ileus versus partial small bowel obstruction. - Obtain stat CT abdomen/pelvis - Continue Zosyn IV - NPO with sips  Hypokalemia Mild with potassium of 3.4. Improved with potassium supplementation. Resolved.  Hyponatremia Mild with a sodium of 133.  Asymptomatic.  Elevated bilirubin Mildly elevated at 2.2 peak.  Unclear etiology.  Trended down to 1.0. Resolved.  Mild intermittent asthma Noted.  Asymptomatic.  Hyperlipidemia Noted.  Patient is not on medication therapy as an outpatient.  OSA Patient is nonadherent with CPAP as an outpatient.  Obesity, class I Estimated body mass index is 33.08 kg/m as calculated from the following:   Height as of this encounter: 5' 9 (1.753 m).   Weight as of this encounter: 101.6  kg.   DVT prophylaxis: Lovenox  Code Status:   Code Status: Full Code Family Communication: None at bedside Disposition Plan: Discharge home pending workup for possible small bowel obstruction and ability to tolerate oral diet   Consultants:  None  Procedures:  None  Antimicrobials: Zosyn IV    Subjective: Patient with recurrent nausea. No emesis today. Still with some abdominal pain, however.  Objective: BP 129/76 (BP Location: Left Arm)   Pulse 83   Temp 98 F (36.7 C)   Resp 15   Ht 5' 9 (1.753 m)   Wt 101.6 kg   SpO2 97%   BMI 33.08 kg/m   Examination:  General exam: Appears calm and comfortable Respiratory system: Clear to auscultation. Respiratory effort normal. Cardiovascular system: S1 & S2 heard, RRR. Gastrointestinal system: Abdomen is mildly distended, soft and mildly tender. Normal bowel sounds heard. Central nervous system: Alert and oriented. No focal neurological deficits. Psychiatry: Judgement and insight appear normal. Mood & affect appropriate.    Data Reviewed: I have personally reviewed following labs and imaging studies  CBC Lab Results  Component Value Date   WBC 11.1 (H) 01/03/2024   RBC 4.16 01/03/2024   HGB 11.9 (L) 01/03/2024   HCT 38.1 01/03/2024   MCV 91.6 01/03/2024   MCH 28.6 01/03/2024   PLT 230 01/03/2024   MCHC 31.2 01/03/2024   RDW 12.3 01/03/2024   LYMPHSABS 1.4 01/02/2024   MONOABS 0.8 01/02/2024   EOSABS 0.1 01/02/2024   BASOSABS 0.0 01/02/2024     Last metabolic panel Lab Results  Component Value Date   NA 140 01/03/2024   K 3.5 01/03/2024   CL 105 01/03/2024   CO2 25 01/03/2024   BUN 6 01/03/2024   CREATININE 0.83 01/03/2024  GLUCOSE 80 01/03/2024   GFRNONAA >60 01/03/2024   GFRAA >60 02/03/2020   CALCIUM 8.6 (L) 01/03/2024   PHOS 3.1 07/08/2019   PROT 6.6 01/03/2024   ALBUMIN 2.7 (L) 01/03/2024   BILITOT 1.0 01/03/2024   ALKPHOS 56 01/03/2024   AST 10 (L) 01/03/2024   ALT 13 01/03/2024    ANIONGAP 10 01/03/2024    GFR: Estimated Creatinine Clearance: 113.2 mL/min (by C-G formula based on SCr of 0.83 mg/dL).  Recent Results (from the past 240 hours)  Blood Culture (routine x 2)     Status: None (Preliminary result)   Collection Time: 12/31/23  7:12 AM   Specimen: BLOOD  Result Value Ref Range Status   Specimen Description   Final    BLOOD RIGHT ANTECUBITAL Performed at [facility], [address], [place], [place] [address]    Special Requests   Final    BOTTLES DRAWN AEROBIC AND ANAEROBIC Blood Culture results may not be optimal due to an inadequate volume of blood received in culture bottles Performed at [facility], [address] [place], [place] [address]    Culture   Final    NO GROWTH 4 DAYS Performed at [facility], [address], [place], [place] [address]    Report Status PENDING  Incomplete  Blood Culture (routine x 2)     Status: None (Preliminary result)   Collection Time: 12/31/23  8:04 AM   Specimen: BLOOD  Result Value Ref Range Status   Specimen Description   Final    BLOOD LEFT ANTECUBITAL Performed at [facility], [address] [place], [place] [address]    Special Requests   Final    BOTTLES DRAWN AEROBIC AND ANAEROBIC Blood Culture results may not be optimal due to an inadequate volume of blood received in culture bottles Performed at [facility], [address] [place], [place] [address]    Culture   Final    NO GROWTH 4 DAYS Performed at [facility], [address], [place], [place] [address]    Report Status PENDING  Incomplete      Radiology Studies: No results found.    LOS: 4 days    Aneita Keens, MD [facility] 01/04/2024, 12:08 PM   If 7PM-7AM, please contact night-coverage [email or web address]

## 2024-01-05 ENCOUNTER — Inpatient Hospital Stay (HOSPITAL_COMMUNITY)

## 2024-01-05 DIAGNOSIS — K5792 Diverticulitis of intestine, part unspecified, without perforation or abscess without bleeding: Secondary | ICD-10-CM | POA: Diagnosis not present

## 2024-01-05 DIAGNOSIS — K567 Ileus, unspecified: Secondary | ICD-10-CM | POA: Diagnosis not present

## 2024-01-05 LAB — BASIC METABOLIC PANEL WITH GFR
Anion gap: 13 (ref 5–15)
BUN: 7 mg/dL (ref 6–20)
CO2: 22 mmol/L (ref 22–32)
Calcium: 8.7 mg/dL — ABNORMAL LOW (ref 8.9–10.3)
Chloride: 101 mmol/L (ref 98–111)
Creatinine, Ser: 0.74 mg/dL (ref 0.44–1.00)
GFR, Estimated: >60 mL/min (ref >60)
Glucose, Bld: 63 mg/dL — ABNORMAL LOW (ref 70–99)
Potassium: 4.5 mmol/L (ref 3.5–5.1)
Sodium: 136 mmol/L (ref 135–145)

## 2024-01-05 LAB — CULTURE, BLOOD (ROUTINE X 2)
Culture: NO GROWTH
Culture: NO GROWTH

## 2024-01-05 LAB — CBC
HCT: 32.3 % — ABNORMAL LOW (ref 36.0–46.0)
Hemoglobin: 10.2 g/dL — ABNORMAL LOW (ref 12.0–15.0)
MCH: 28.6 pg (ref 26.0–34.0)
MCHC: 31.6 g/dL (ref 30.0–36.0)
MCV: 90.5 fL (ref 80.0–100.0)
Platelets: 232 10*3/uL (ref 150–400)
RBC: 3.57 MIL/uL — ABNORMAL LOW (ref 3.87–5.11)
RDW: 11.9 % (ref 11.5–15.5)
WBC: 7.9 10*3/uL (ref 4.0–10.5)
nRBC: 0 % (ref 0.0–0.2)

## 2024-01-05 LAB — COMPREHENSIVE METABOLIC PANEL WITH GFR
ALT: 11 U/L (ref 0–44)
AST: 9 U/L — ABNORMAL LOW (ref 15–41)
Albumin: 2.2 g/dL — ABNORMAL LOW (ref 3.5–5.0)
Alkaline Phosphatase: 43 U/L (ref 38–126)
Anion gap: 14 (ref 5–15)
BUN: 5 mg/dL — ABNORMAL LOW (ref 6–20)
CO2: 20 mmol/L — ABNORMAL LOW (ref 22–32)
Calcium: 7.3 mg/dL — ABNORMAL LOW (ref 8.9–10.3)
Chloride: 95 mmol/L — ABNORMAL LOW (ref 98–111)
Creatinine, Ser: 0.63 mg/dL (ref 0.44–1.00)
GFR, Estimated: >60 mL/min (ref >60)
Glucose, Bld: 72 mg/dL (ref 70–99)
Potassium: 2.9 mmol/L — ABNORMAL LOW (ref 3.5–5.1)
Sodium: 129 mmol/L — ABNORMAL LOW (ref 135–145)
Total Bilirubin: 0.9 mg/dL (ref 0.0–1.2)
Total Protein: 5.4 g/dL — ABNORMAL LOW (ref 6.5–8.1)

## 2024-01-05 LAB — MAGNESIUM: Magnesium: 1.4 mg/dL — ABNORMAL LOW (ref 1.7–2.4)

## 2024-01-05 MED ORDER — IOHEXOL 9 MG/ML PO SOLN
500.0000 mL | ORAL | Status: AC
  Administered 2024-01-05 (×2): 500 mL via ORAL

## 2024-01-05 MED ORDER — SODIUM CHLORIDE 0.9 % IV SOLN: INTRAVENOUS | Status: AC

## 2024-01-05 MED ORDER — POTASSIUM CHLORIDE 10 MEQ/100ML IV SOLN
10.0000 meq | INTRAVENOUS | Status: AC
  Administered 2024-01-05 (×4): 10 meq via INTRAVENOUS
  Filled 2024-01-05 (×2): qty 100

## 2024-01-05 NOTE — Progress Notes (Signed)
 PROGRESS NOTE    Amy Mendoza  FMW:[id number] DOB: 11/24/1980 DOA: 12/31/2023 PCP: Vicci Barnie NOVAK, MD   Brief Narrative: Amy Mendoza is a 43 y.o. female with a history of anxiety, asthma, chronic headache, COVID-19, cholelithiasis, gastritis, GERD, hyperlipidemia, nephrolithiasis.  Patient presented secondary to abdominal pain and was found to have evidence of acute diverticulitis.  Empiric antibiotics and bowel rest initiated on admission.  Patient's symptoms managed with analgesics as needed.  Diet advanced as tolerated, however patient with intermittent nausea with some vomiting. Abdominal x-ray suggests ileus versus partial bowel obstruction.   Assessment and Plan:  Acute diverticulitis Patient with pain and leukocytosis.  CT abdomen/pelvis significant for acute diverticulitis involving the distal descending colon and sigmoid colon junction with associated moderate amount of residual fecal material throughout the colon without obstruction or constipation.  Patient started empirically on Zosyn  IV for management in addition to bowel rest and analgesics.  Diet advanced but patient had an episode of emesis; diet restricted to NPO.  Leukocytosis improving. Patient's abdominal pain and intermittent emesis/nausea persisting. Abdominal x-ray obtained which is concerning for ileus versus partial small bowel obstruction. Stat CT abdomen/pelvis ordered on 6/20 and is still pending. - Follow-up stat CT abdomen/pelvis - Continue Zosyn  IV - NPO with sips  Hypokalemia Mild with potassium of 3.4. Improved with potassium supplementation. Resolved.  Hyponatremia Worsened. Associated hypochloremia. Still asymptomatic. -NS IV fluids  Hypokalemia -Potassium supplementation  Elevated bilirubin Mildly elevated at 2.2 peak.  Unclear etiology.  Trended down to 1.0. Resolved.  Mild intermittent asthma Noted.  Asymptomatic.  Hyperlipidemia Noted.  Patient is not on medication therapy as  an outpatient.  OSA Patient is nonadherent with CPAP as an outpatient.  Obesity, class I Estimated body mass index is 32.65 kg/m as calculated from the following:   Height as of this encounter: 5' 9 (1.753 m).   Weight as of this encounter: 100.3 kg.   DVT prophylaxis: Lovenox  Code Status:   Code Status: Full Code Family Communication: None at bedside Disposition Plan: Discharge home pending workup for possible small bowel obstruction and ability to tolerate oral diet   Consultants:  None  Procedures:  None  Antimicrobials: Zosyn  IV    Subjective: No emesis overnight. Having a lot of belching. Passing gas. Had a tiny bowel movement last night. Still with fullness/discomfort in center of abdomen.  Objective: BP (!) 148/80 (BP Location: Left Arm)   Pulse 87   Temp 98.1 F (36.7 C) (Oral)   Resp 18   Ht 5' 9 (1.753 m)   Wt 100.3 kg   SpO2 95%   BMI 32.65 kg/m   Examination:  General exam: Appears calm and comfortable Respiratory system: Clear to auscultation. Respiratory effort normal. Cardiovascular system: S1 & S2 heard, RRR. Gastrointestinal system: Abdomen is nondistended, soft and mildly tender in middle and left lower quadrant areas. Normal bowel sounds heard. Central nervous system: Alert and oriented. No focal neurological deficits. Musculoskeletal: No edema. No calf tenderness Psychiatry: Judgement and insight appear normal. Mood & affect appropriate.    Data Reviewed: I have personally reviewed following labs and imaging studies  CBC Lab Results  Component Value Date   WBC 7.9 01/05/2024   RBC 3.57 (L) 01/05/2024   HGB 10.2 (L) 01/05/2024   HCT 32.3 (L) 01/05/2024   MCV 90.5 01/05/2024   MCH 28.6 01/05/2024   PLT 232 01/05/2024   MCHC 31.6 01/05/2024   RDW 11.9 01/05/2024   LYMPHSABS 1.4 01/02/2024  MONOABS 0.8 01/02/2024   EOSABS 0.1 01/02/2024   BASOSABS 0.0 01/02/2024     Last metabolic panel Lab Results  Component Value Date    NA 129 (L) 01/05/2024   K 2.9 (L) 01/05/2024   CL 95 (L) 01/05/2024   CO2 20 (L) 01/05/2024   BUN 5 (L) 01/05/2024   CREATININE 0.63 01/05/2024   GLUCOSE 72 01/05/2024   GFRNONAA >60 01/05/2024   GFRAA >60 02/03/2020   CALCIUM 7.3 (L) 01/05/2024   PHOS 3.1 07/08/2019   PROT 5.4 (L) 01/05/2024   ALBUMIN 2.2 (L) 01/05/2024   BILITOT 0.9 01/05/2024   ALKPHOS 43 01/05/2024   AST 9 (L) 01/05/2024   ALT 11 01/05/2024   ANIONGAP 14 01/05/2024    GFR: Estimated Creatinine Clearance: 116.6 mL/min (by C-G formula based on SCr of 0.63 mg/dL).  Recent Results (from the past 240 hours)  Blood Culture (routine x 2)     Status: None   Collection Time: 12/31/23  7:12 AM   Specimen: BLOOD  Result Value Ref Range Status   Specimen Description   Final    BLOOD RIGHT ANTECUBITAL Performed at [facility], [address], [place], [place] [address]    Special Requests   Final    BOTTLES DRAWN AEROBIC AND ANAEROBIC Blood Culture results may not be optimal due to an inadequate volume of blood received in culture bottles Performed at [facility], [address], [place], [place] [address]    Culture   Final    NO GROWTH 5 DAYS Performed at [facility], [address] [place], [place] [address]    Report Status 01/05/2024 FINAL  Final  Blood Culture (routine x 2)     Status: None   Collection Time: 12/31/23  8:04 AM   Specimen: BLOOD  Result Value Ref Range Status   Specimen Description   Final    BLOOD LEFT ANTECUBITAL Performed at [facility], [address], [place], [place] [address]    Special Requests   Final    BOTTLES DRAWN AEROBIC AND ANAEROBIC Blood Culture results may not be optimal due to an inadequate volume of blood received in culture bottles Performed at [facility], [address] [place], [place] [address]    Culture   Final    NO GROWTH 5 DAYS Performed at [facility], [address], [place], [place] [address]    Report Status 01/05/2024 FINAL  Final      Radiology Studies: DG Abd 2 Views Result Date: 01/04/2024 CLINICAL DATA:  Abdomen pain EXAM: ABDOMEN - 2 VIEW COMPARISON:  CT 12/31/2023 FINDINGS: Postsurgical changes in the upper abdomen and clips in the right upper quadrant. Slightly limited erect view of the abdomen, no obvious free air. Interval air distension of multiple small bowel loops measuring up to 5.4 cm. Some colon gas is noted. Ligation clips in the pelvis IMPRESSION: Interval air distension of multiple small bowel loops measuring up to 5.4 cm. Findings could be secondary to ileus though developing or partial small bowel obstruction could also produce this appearance Electronically Signed   By: Luke Bun M.D.   On: 01/04/2024 16:36      LOS: 5 days    Elgin Lam, MD [facility] 01/05/2024, 8:58 AM   If 7PM-7AM, please contact night-coverage [email or web address]

## 2024-01-05 NOTE — Consult Note (Signed)
 Reason for Consult:ab pain Referring Physician: Dr Briana Norvel LITTIE Amy Mendoza is an 43 y.o. female.  HPI: 19 yof with no prior episodes presented five days ago and was admitted for diverticulitis.  She has been on zosyn  since then. Her wbc is now normal. She feels better.  She had diet advanced to soft diet yesterday and had episode of emesis. Another ct was done that shows continued diverticulitis with either ileus and psbo related to diverticular inflammation.  When I see her she has some mild llq pain, was tol clears fine.  Past Medical History:  Diagnosis Date   Anxiety    Asthma    Chronic headaches    COVID-19    Gallstones    Gastritis    GERD (gastroesophageal reflux disease)    Hyperlipemia    no meds   Kidney stones     Past Surgical History:  Procedure Laterality Date   BARIATRIC SURGERY  05/17/2022   CHOLECYSTECTOMY  08/2004   COLONOSCOPY     KNEE SURGERY Left     Family History  Problem Relation Age of Onset   Diabetes Father    Heart failure Father    Heart attack Father    Prostate cancer Maternal Grandfather    Pancreatic cancer Maternal Grandfather    Colon cancer Maternal Grandfather    Breast cancer Maternal Aunt    Ovarian cancer Mother    Diabetes Maternal Grandmother    Kidney Stones Maternal Grandmother    Heart attack Paternal Grandfather    Heart attack Paternal Aunt    Diabetes Maternal Uncle    Asthma Son    Tics Neg Hx    Migraines Neg Hx    Esophageal cancer Neg Hx    Rectal cancer Neg Hx    Stomach cancer Neg Hx     Social History:  reports that she has never smoked. She has never used smokeless tobacco. She reports that she does not currently use alcohol. She reports that she does not currently use drugs.  Allergies:  Allergies  Allergen Reactions   Metformin And Related Diarrhea   Sertraline  Anxiety and Other (See Comments)    Pt states it made her crazy/worsened anxiety    Medications: I have reviewed the patient's current  medications.  Results for orders placed or performed during the hospital encounter of 12/31/23 (from the past 48 hours)  CBC     Status: Abnormal   Collection Time: 01/05/24  5:16 AM  Result Value Ref Range   WBC 7.9 4.0 - 10.5 K/uL   RBC 3.57 (L) 3.87 - 5.11 MIL/uL   Hemoglobin 10.2 (L) 12.0 - 15.0 g/dL   HCT 67.6 (L) 63.9 - 53.9 %   MCV 90.5 80.0 - 100.0 fL   MCH 28.6 26.0 - 34.0 pg   MCHC 31.6 30.0 - 36.0 g/dL   RDW 88.0 88.4 - 84.4 %   Platelets 232 150 - 400 K/uL   nRBC 0.0 0.0 - 0.2 %    Comment: Performed at [facility], [address] [place], [place] [address]  Comprehensive metabolic panel     Status: Abnormal   Collection Time: 01/05/24  5:16 AM  Result Value Ref Range   Sodium 129 (L) 135 - 145 mmol/L    Comment: DELTA CHECK NOTED   Potassium 2.9 (L) 3.5 - 5.1 mmol/L   Chloride 95 (L) 98 - 111 mmol/L   CO2 20 (L) 22 - 32 mmol/L   Glucose,  Bld 72 70 - 99 mg/dL    Comment: Glucose reference range applies only to samples taken after fasting for at least 8 hours.   BUN 5 (L) 6 - 20 mg/dL   Creatinine, Ser 9.36 0.44 - 1.00 mg/dL   Calcium 7.3 (L) 8.9 - 10.3 mg/dL   Total Protein 5.4 (L) 6.5 - 8.1 g/dL   Albumin 2.2 (L) 3.5 - 5.0 g/dL   AST 9 (L) 15 - 41 U/L   ALT 11 0 - 44 U/L   Alkaline Phosphatase 43 38 - 126 U/L   Total Bilirubin 0.9 0.0 - 1.2 mg/dL   GFR, Estimated >39 >39 mL/min    Comment: (NOTE) Calculated using the CKD-EPI Creatinine Equation (2021)    Anion gap 14 5 - 15    Comment: Performed at [facility], [address] [place], [place] [address]  Magnesium     Status: Abnormal   Collection Time: 01/05/24  5:16 AM  Result Value Ref Range   Magnesium 1.4 (L) 1.7 - 2.4 mg/dL    Comment: Performed at [facility], [address], [place], [place] [address]  Basic metabolic panel     Status: Abnormal   Collection Time: 01/05/24  5:20 PM  Result Value Ref Range   Sodium 136 135 - 145 mmol/L     Comment: DELTA CHECK NOTED   Potassium 4.5 3.5 - 5.1 mmol/L    Comment: HEMOLYSIS AT THIS LEVEL MAY AFFECT RESULT DELTA CHECK NOTED    Chloride 101 98 - 111 mmol/L   CO2 22 22 - 32 mmol/L   Glucose, Bld 63 (L) 70 - 99 mg/dL    Comment: Glucose reference range applies only to samples taken after fasting for at least 8 hours.   BUN 7 6 - 20 mg/dL   Creatinine, Ser 9.25 0.44 - 1.00 mg/dL   Calcium 8.7 (L) 8.9 - 10.3 mg/dL   GFR, Estimated >39 >39 mL/min    Comment: (NOTE) Calculated using the CKD-EPI Creatinine Equation (2021)    Anion gap 13 5 - 15    Comment: Performed at [facility], [address] [place], [place] [address]    CT ABDOMEN PELVIS WO CONTRAST Result Date: 01/05/2024 CLINICAL DATA:  Bowel obstruction suspected.  Abdominal pain EXAM: CT ABDOMEN AND PELVIS WITHOUT CONTRAST TECHNIQUE: Multidetector CT imaging of the abdomen and pelvis was performed following the standard protocol without IV contrast. RADIATION DOSE REDUCTION: This exam was performed according to the departmental dose-optimization program which includes automated exposure control, adjustment of the mA and/or kV according to patient size and/or use of iterative reconstruction technique. COMPARISON:  CT 12/31/2023 FINDINGS: Lower chest: Lung bases are clear. Hepatobiliary: No focal hepatic lesion. Postcholecystectomy. No biliary dilatation. Pancreas: Pancreas is normal. No ductal dilatation. No pancreatic inflammation. Spleen: Normal spleen Adrenals/urinary tract: Adrenal glands and kidneys are normal. The ureters and bladder normal. Stomach/Bowel: Postsurgical change in the stomach suggesting gastric sleeve bariatric surgery. Contrast is present within the duodenum and proximal small bowel. There is dilatation of the proximal small bowel. There is a focus of small bowel wall thickening in the LEFT mid abdomen (image 56/series 10). This small bowel thickening partially obstructs the progression of  oral contrast. The distal small bowel does have oral contrast but is reduced in caliber. This focal small bowel inflammation and stenosis is immediately adjacent to acute diverticulitis of the descending colon described on comparison CT. On comparison CT there was early small  bowel edema through this region. The edema has increased now with partial obstruction. There are several pockets of gas at this site (image 58/7) which presumably within the bowel wall. Cannot completely exclude a contained perforation of although less favored. Terminal ileum is normal. Appendix not identified. Ascending and transverse colon normal. Acute diverticulitis of the mid sigmoid colon as described above. Diverticulosis of the distal sigmoid colon. Rectum normal. There is no evidence of extra intraperitoneal free air. Vascular/Lymphatic: Abdominal aorta is normal caliber. No periportal or retroperitoneal adenopathy. No pelvic adenopathy. Reproductive: Uterus and adnexa unremarkable. Other: No free fluid. Musculoskeletal: No aggressive osseous lesion. IMPRESSION: 1. Progressive inflammatory reaction in the peritoneal space involving the mid sigmoid colon diverticulitis now with partial obstruction of the small bowel. 2. Partial small bowel obstruction secondary to focal small bowel wall thickening and luminal narrowing in the LEFT mid abdomen. This is immediately adjacent to acute diverticulitis of the mid sigmoid colon. Findings progressive over CT 12/31/2023. 3. Several pockets of gas at this site are favored to be within the bowel wall. Cannot completely exclude a contained perforation related to the sigmoid diverticulitis. 4. No evidence of extra intraperitoneal free air. 5. Postsurgical change in the stomach suggesting gastric sleeve bariatric surgery. Electronically Signed   By: Jackquline Boxer M.D.   On: 01/05/2024 11:20   DG Abd 2 Views Result Date: 01/04/2024 CLINICAL DATA:  Abdomen pain EXAM: ABDOMEN - 2 VIEW COMPARISON:   CT 12/31/2023 FINDINGS: Postsurgical changes in the upper abdomen and clips in the right upper quadrant. Slightly limited erect view of the abdomen, no obvious free air. Interval air distension of multiple small bowel loops measuring up to 5.4 cm. Some colon gas is noted. Ligation clips in the pelvis IMPRESSION: Interval air distension of multiple small bowel loops measuring up to 5.4 cm. Findings could be secondary to ileus though developing or partial small bowel obstruction could also produce this appearance Electronically Signed   By: Luke Bun M.D.   On: 01/04/2024 16:36    Review of Systems  Constitutional:  Negative for fever.  Gastrointestinal:  Positive for abdominal pain and nausea.  All other systems reviewed and are negative.  Blood pressure 124/72, pulse 66, temperature 98 F (36.7 C), temperature source Oral, resp. rate 18, height 5' 9 (1.753 m), weight 100.3 kg, SpO2 99%. Physical Exam Vitals reviewed.  Constitutional:      Appearance: She is well-developed.   Eyes:     General: No scleral icterus.   Cardiovascular:     Rate and Rhythm: Normal rate and regular rhythm.  Pulmonary:     Effort: Pulmonary effort is normal.  Abdominal:     Palpations: Abdomen is soft.     Tenderness: There is abdominal tenderness in the left lower quadrant.     Hernia: No hernia is present.   Skin:    Capillary Refill: Capillary refill takes less than 2 seconds.   Neurological:     General: No focal deficit present.     Mental Status: She is alert.   Psychiatric:        Mood and Affect: Mood normal.        Behavior: Behavior normal.     Assessment/Plan: Diverticulitis -will switch to invanz maybe this will help -she was doing fine on clears and not obstructed (passing flatus, no bm- but this is also her norm) so will let her have clears -lovenox  -discussed role of surgery now that we are involved -will follow with you  I reviewed hospitalist notes, last 24 h vitals and  pain scores, last 48 h intake and output, last 24 h labs and trends, and last 24 h imaging results.   Donnice Bury 01/05/2024, 6:06 PM

## 2024-01-05 NOTE — Plan of Care (Signed)
  Problem: Clinical Measurements: Goal: Will remain free from infection Outcome: Progressing   Problem: Clinical Measurements: Goal: Diagnostic test results will improve Outcome: Progressing   Problem: Nutrition: Goal: Adequate nutrition will be maintained Outcome: Adequate for Discharge

## 2024-01-06 DIAGNOSIS — K567 Ileus, unspecified: Secondary | ICD-10-CM | POA: Diagnosis not present

## 2024-01-06 DIAGNOSIS — K5792 Diverticulitis of intestine, part unspecified, without perforation or abscess without bleeding: Secondary | ICD-10-CM | POA: Diagnosis not present

## 2024-01-06 LAB — BASIC METABOLIC PANEL WITH GFR
Anion gap: 12 (ref 5–15)
BUN: 6 mg/dL (ref 6–20)
CO2: 23 mmol/L (ref 22–32)
Calcium: 8.9 mg/dL (ref 8.9–10.3)
Chloride: 102 mmol/L (ref 98–111)
Creatinine, Ser: 0.64 mg/dL (ref 0.44–1.00)
GFR, Estimated: >60 mL/min (ref >60)
Glucose, Bld: 92 mg/dL (ref 70–99)
Potassium: 3.6 mmol/L (ref 3.5–5.1)
Sodium: 137 mmol/L (ref 135–145)

## 2024-01-06 LAB — MAGNESIUM: Magnesium: 1.8 mg/dL (ref 1.7–2.4)

## 2024-01-06 MED ORDER — ENSURE PLUS HIGH PROTEIN PO LIQD
237.0000 mL | Freq: Two times a day (BID) | ORAL | Status: DC
  Administered 2024-01-06 – 2024-01-08 (×4): 237 mL via ORAL

## 2024-01-06 MED ORDER — SODIUM CHLORIDE 0.9 % IV SOLN
1.0000 g | Freq: Every day | INTRAVENOUS | Status: DC
  Administered 2024-01-06 – 2024-01-07 (×2): 1 g via INTRAVENOUS
  Filled 2024-01-06 (×2): qty 1000

## 2024-01-06 NOTE — Plan of Care (Signed)
 Pt reports that she is flatulent. She has not had a BM. Will continue to monitor.

## 2024-01-06 NOTE — Progress Notes (Signed)
 PROGRESS NOTE    Amy Mendoza  FMW:[id number] DOB: 1981/03/29 DOA: 12/31/2023 PCP: Vicci Barnie NOVAK, MD   Brief Narrative: Amy Mendoza is a 43 y.o. female with a history of anxiety, asthma, chronic headache, COVID-19, cholelithiasis, gastritis, GERD, hyperlipidemia, nephrolithiasis.  Patient presented secondary to abdominal pain and was found to have evidence of acute diverticulitis.  Empiric antibiotics and bowel rest initiated on admission.  Patient's symptoms managed with analgesics as needed.  Diet advanced as tolerated, however patient with intermittent nausea with some vomiting. Abdominal x-ray suggests ileus versus partial bowel obstruction.   Assessment and Plan:  Acute diverticulitis Patient with pain and leukocytosis.  CT abdomen/pelvis significant for acute diverticulitis involving the distal descending colon and sigmoid colon junction with associated moderate amount of residual fecal material throughout the colon without obstruction or constipation.  Patient started empirically on Zosyn  IV for management in addition to bowel rest and analgesics.  Diet advanced but patient had an episode of emesis; diet restricted to NPO.  Leukocytosis improving. Patient's abdominal pain and intermittent emesis/nausea persisting. Abdominal x-ray obtained which is concerning for ileus versus partial small bowel obstruction. Stat CT abdomen/pelvis ordered on 6/20 and returned significant for evidence of possible small bowel obstruction. - General surgery transitioned patient from Zosyn  IV to Ertapenem IV  Possible partial small bowel obstruction Noted on abdominal x-ray and CT imaging. General surgery consulted. -General surgery recommendations: conservative management. Advancing diet  Hypokalemia Mild with potassium of 3.4. Improved with potassium supplementation. Resolved.  Hyponatremia Worsened. Associated hypochloremia. Resolved with IV fluids.  Hypokalemia Resolved with  potassium supplementation.  Elevated bilirubin Mildly elevated at 2.2 peak.  Unclear etiology.  Trended down to 0.9. Resolved.  Mild intermittent asthma Noted.  Asymptomatic.  Hyperlipidemia Noted.  Patient is not on medication therapy as an outpatient.  OSA Patient is nonadherent with CPAP as an outpatient.  Obesity, class I Estimated body mass index is 32.56 kg/m as calculated from the following:   Height as of this encounter: 5' 9 (1.753 m).   Weight as of this encounter: 100 kg.   DVT prophylaxis: Lovenox  Code Status:   Code Status: Full Code Family Communication: None at bedside Disposition Plan: Discharge home pending general surgery recommendations   Consultants:  None  Procedures:  None  Antimicrobials: Zosyn  IV Ertapenem   Subjective: Some sharp pains in right upper abdomen area. Passing gas. No bowel movement overnight. Belching is slightly improved.  Objective: BP 120/70 (BP Location: Left Arm)   Pulse 69   Temp 98.4 F (36.9 C) (Oral)   Resp 16   Ht 5' 9 (1.753 m)   Wt 100 kg   SpO2 96%   BMI 32.56 kg/m   Examination:  General exam: Appears calm and comfortable Respiratory system: Clear to auscultation. Respiratory effort normal. Cardiovascular system: S1 & S2 heard, RRR. No murmurs, rubs, gallops or clicks. Gastrointestinal system: Abdomen is nondistended, soft and tender in LLQ. Normal bowel sounds heard. Central nervous system: Alert and oriented. No focal neurological deficits. Musculoskeletal: No edema. No calf tenderness Psychiatry: Judgement and insight appear normal. Mood & affect appropriate.    Data Reviewed: I have personally reviewed following labs and imaging studies  CBC Lab Results  Component Value Date   WBC 7.9 01/05/2024   RBC 3.57 (L) 01/05/2024   HGB 10.2 (L) 01/05/2024   HCT 32.3 (L) 01/05/2024   MCV 90.5 01/05/2024   MCH 28.6 01/05/2024   PLT 232 01/05/2024   MCHC 31.6 01/05/2024  RDW 11.9 01/05/2024    LYMPHSABS 1.4 01/02/2024   MONOABS 0.8 01/02/2024   EOSABS 0.1 01/02/2024   BASOSABS 0.0 01/02/2024     Last metabolic panel Lab Results  Component Value Date   NA 137 01/06/2024   K 3.6 01/06/2024   CL 102 01/06/2024   CO2 23 01/06/2024   BUN 6 01/06/2024   CREATININE 0.64 01/06/2024   GLUCOSE 92 01/06/2024   GFRNONAA >60 01/06/2024   GFRAA >60 02/03/2020   CALCIUM 8.9 01/06/2024   PHOS 3.1 07/08/2019   PROT 5.4 (L) 01/05/2024   ALBUMIN 2.2 (L) 01/05/2024   BILITOT 0.9 01/05/2024   ALKPHOS 43 01/05/2024   AST 9 (L) 01/05/2024   ALT 11 01/05/2024   ANIONGAP 12 01/06/2024    GFR: Estimated Creatinine Clearance: 116.4 mL/min (by C-G formula based on SCr of 0.64 mg/dL).  Recent Results (from the past 240 hours)  Blood Culture (routine x 2)     Status: None   Collection Time: 12/31/23  7:12 AM   Specimen: BLOOD  Result Value Ref Range Status   Specimen Description   Final    BLOOD RIGHT ANTECUBITAL Performed at [facility], [address] [place], [place] [address]    Special Requests   Final    BOTTLES DRAWN AEROBIC AND ANAEROBIC Blood Culture results may not be optimal due to an inadequate volume of blood received in culture bottles Performed at [facility], [address], [place], [place] [address]    Culture   Final    NO GROWTH 5 DAYS Performed at [facility], [address], [place], [place] [address]    Report Status 01/05/2024 FINAL  Final  Blood Culture (routine x 2)     Status: None   Collection Time: 12/31/23  8:04 AM   Specimen: BLOOD  Result Value Ref Range Status   Specimen Description   Final    BLOOD LEFT ANTECUBITAL Performed at [facility], [address] [place], [place] [address]    Special Requests   Final    BOTTLES DRAWN AEROBIC AND ANAEROBIC Blood Culture results may not be optimal due to an inadequate volume of blood received in culture bottles Performed at [facility], [address], [place], [place] [address]    Culture   Final    NO GROWTH 5 DAYS Performed at [facility], [address] [place], [place] [address]    Report Status 01/05/2024 FINAL  Final      Radiology Studies: CT ABDOMEN PELVIS WO CONTRAST Result Date: 01/05/2024 CLINICAL DATA:  Bowel obstruction suspected.  Abdominal pain EXAM: CT ABDOMEN AND PELVIS WITHOUT CONTRAST TECHNIQUE: Multidetector CT imaging of the abdomen and pelvis was performed following the standard protocol without IV contrast. RADIATION DOSE REDUCTION: This exam was performed according to the departmental dose-optimization program which includes automated exposure control, adjustment of the mA and/or kV according to patient size and/or use of iterative reconstruction technique. COMPARISON:  CT 12/31/2023 FINDINGS: Lower chest: Lung bases are clear. Hepatobiliary: No focal hepatic lesion. Postcholecystectomy. No biliary dilatation. Pancreas: Pancreas is normal. No ductal dilatation. No pancreatic inflammation. Spleen: Normal spleen Adrenals/urinary tract: Adrenal glands and kidneys are normal. The ureters and bladder normal. Stomach/Bowel: Postsurgical change in the stomach suggesting gastric sleeve bariatric surgery. Contrast is present within the duodenum and proximal small bowel. There is dilatation of the proximal small bowel. There is a focus of small bowel  wall thickening in the LEFT mid abdomen (image 56/series 10). This small bowel thickening partially obstructs the progression of oral contrast. The distal small bowel does have oral contrast but is reduced in caliber. This focal small bowel inflammation and stenosis is immediately adjacent to acute diverticulitis of the descending colon described on comparison CT. On comparison CT there was early small bowel edema through this region. The edema has increased now with partial obstruction. There are several pockets of gas at this site (image  58/7) which presumably within the bowel wall. Cannot completely exclude a contained perforation of although less favored. Terminal ileum is normal. Appendix not identified. Ascending and transverse colon normal. Acute diverticulitis of the mid sigmoid colon as described above. Diverticulosis of the distal sigmoid colon. Rectum normal. There is no evidence of extra intraperitoneal free air. Vascular/Lymphatic: Abdominal aorta is normal caliber. No periportal or retroperitoneal adenopathy. No pelvic adenopathy. Reproductive: Uterus and adnexa unremarkable. Other: No free fluid. Musculoskeletal: No aggressive osseous lesion. IMPRESSION: 1. Progressive inflammatory reaction in the peritoneal space involving the mid sigmoid colon diverticulitis now with partial obstruction of the small bowel. 2. Partial small bowel obstruction secondary to focal small bowel wall thickening and luminal narrowing in the LEFT mid abdomen. This is immediately adjacent to acute diverticulitis of the mid sigmoid colon. Findings progressive over CT 12/31/2023. 3. Several pockets of gas at this site are favored to be within the bowel wall. Cannot completely exclude a contained perforation related to the sigmoid diverticulitis. 4. No evidence of extra intraperitoneal free air. 5. Postsurgical change in the stomach suggesting gastric sleeve bariatric surgery. Electronically Signed   By: Jackquline Boxer M.D.   On: 01/05/2024 11:20   DG Abd 2 Views Result Date: 01/04/2024 CLINICAL DATA:  Abdomen pain EXAM: ABDOMEN - 2 VIEW COMPARISON:  CT 12/31/2023 FINDINGS: Postsurgical changes in the upper abdomen and clips in the right upper quadrant. Slightly limited erect view of the abdomen, no obvious free air. Interval air distension of multiple small bowel loops measuring up to 5.4 cm. Some colon gas is noted. Ligation clips in the pelvis IMPRESSION: Interval air distension of multiple small bowel loops measuring up to 5.4 cm. Findings could be  secondary to ileus though developing or partial small bowel obstruction could also produce this appearance Electronically Signed   By: Luke Bun M.D.   On: 01/04/2024 16:36      LOS: 6 days    Elgin Lam, MD [facility] 01/06/2024, 8:50 AM   If 7PM-7AM, please contact night-coverage [email or web address]

## 2024-01-06 NOTE — Plan of Care (Signed)

## 2024-01-06 NOTE — Progress Notes (Addendum)
 Subjective/Chief Complaint: Tol clears fine, having flatus no bm yet, no n/v   Objective: Vital signs in last 24 hours: Temp:  [98 F (36.7 C)-98.8 F (37.1 C)] 98.4 F (36.9 C) (06/22 0546) Pulse Rate:  [66-80] 69 (06/22 0546) Resp:  [16-18] 16 (06/22 0546) BP: (120-128)/(70-78) 120/70 (06/22 0546) SpO2:  [96 %-99 %] 96 % (06/22 0546) Weight:  [100 kg] 100 kg (06/22 0500) Last BM Date : 01/04/24  Intake/Output from previous day: 06/21 0701 - 06/22 0700 In: 1536.2 [P.O.:535; I.V.:477.6; IV Piggyback:523.6] Out: 0  Intake/Output this shift: No intake/output data recorded.  Ab focal llq tender, soft nondistended  Lab Results:  Recent Labs    01/05/24 0516  WBC 7.9  HGB 10.2*  HCT 32.3*  PLT 232   BMET Recent Labs    01/05/24 1720 01/06/24 0535  NA 136 137  K 4.5 3.6  CL 101 102  CO2 22 23  GLUCOSE 63* 92  BUN 7 6  CREATININE 0.74 0.64  CALCIUM 8.7* 8.9   PT/INR No results for input(s): LABPROT, INR in the last 72 hours. ABG No results for input(s): PHART, HCO3 in the last 72 hours.  Invalid input(s): PCO2, PO2  Studies/Results: CT ABDOMEN PELVIS WO CONTRAST Result Date: 01/05/2024 CLINICAL DATA:  Bowel obstruction suspected.  Abdominal pain EXAM: CT ABDOMEN AND PELVIS WITHOUT CONTRAST TECHNIQUE: Multidetector CT imaging of the abdomen and pelvis was performed following the standard protocol without IV contrast. RADIATION DOSE REDUCTION: This exam was performed according to the departmental dose-optimization program which includes automated exposure control, adjustment of the mA and/or kV according to patient size and/or use of iterative reconstruction technique. COMPARISON:  CT 12/31/2023 FINDINGS: Lower chest: Lung bases are clear. Hepatobiliary: No focal hepatic lesion. Postcholecystectomy. No biliary dilatation. Pancreas: Pancreas is normal. No ductal dilatation. No pancreatic inflammation. Spleen: Normal spleen Adrenals/urinary tract:  Adrenal glands and kidneys are normal. The ureters and bladder normal. Stomach/Bowel: Postsurgical change in the stomach suggesting gastric sleeve bariatric surgery. Contrast is present within the duodenum and proximal small bowel. There is dilatation of the proximal small bowel. There is a focus of small bowel wall thickening in the LEFT mid abdomen (image 56/series 10). This small bowel thickening partially obstructs the progression of oral contrast. The distal small bowel does have oral contrast but is reduced in caliber. This focal small bowel inflammation and stenosis is immediately adjacent to acute diverticulitis of the descending colon described on comparison CT. On comparison CT there was early small bowel edema through this region. The edema has increased now with partial obstruction. There are several pockets of gas at this site (image 58/7) which presumably within the bowel wall. Cannot completely exclude a contained perforation of although less favored. Terminal ileum is normal. Appendix not identified. Ascending and transverse colon normal. Acute diverticulitis of the mid sigmoid colon as described above. Diverticulosis of the distal sigmoid colon. Rectum normal. There is no evidence of extra intraperitoneal free air. Vascular/Lymphatic: Abdominal aorta is normal caliber. No periportal or retroperitoneal adenopathy. No pelvic adenopathy. Reproductive: Uterus and adnexa unremarkable. Other: No free fluid. Musculoskeletal: No aggressive osseous lesion. IMPRESSION: 1. Progressive inflammatory reaction in the peritoneal space involving the mid sigmoid colon diverticulitis now with partial obstruction of the small bowel. 2. Partial small bowel obstruction secondary to focal small bowel wall thickening and luminal narrowing in the LEFT mid abdomen. This is immediately adjacent to acute diverticulitis of the mid sigmoid colon. Findings progressive over CT 12/31/2023. 3. Several  pockets of gas at this site are  favored to be within the bowel wall. Cannot completely exclude a contained perforation related to the sigmoid diverticulitis. 4. No evidence of extra intraperitoneal free air. 5. Postsurgical change in the stomach suggesting gastric sleeve bariatric surgery. Electronically Signed   By: Jackquline Boxer M.D.   On: 01/05/2024 11:20   DG Abd 2 Views Result Date: 01/04/2024 CLINICAL DATA:  Abdomen pain EXAM: ABDOMEN - 2 VIEW COMPARISON:  CT 12/31/2023 FINDINGS: Postsurgical changes in the upper abdomen and clips in the right upper quadrant. Slightly limited erect view of the abdomen, no obvious free air. Interval air distension of multiple small bowel loops measuring up to 5.4 cm. Some colon gas is noted. Ligation clips in the pelvis IMPRESSION: Interval air distension of multiple small bowel loops measuring up to 5.4 cm. Findings could be secondary to ileus though developing or partial small bowel obstruction could also produce this appearance Electronically Signed   By: Luke Bun M.D.   On: 01/04/2024 16:36    Anti-infectives: Anti-infectives (From admission, onward)    Start     Dose/Rate Route Frequency Ordered Stop   01/06/24 0915  ertapenem (INVANZ) 1 g in sodium chloride  0.9 % 100 mL IVPB        1 g 200 mL/hr over 30 Minutes Intravenous Every 24 hours 01/06/24 0815     12/31/23 1700  piperacillin -tazobactam (ZOSYN ) IVPB 3.375 g  Status:  Discontinued        3.375 g 12.5 mL/hr over 240 Minutes Intravenous Every 8 hours 12/31/23 1359 01/06/24 0815   12/31/23 0900  piperacillin -tazobactam (ZOSYN ) IVPB 3.375 g        3.375 g 100 mL/hr over 30 Minutes Intravenous  Once 12/31/23 0846 12/31/23 0939       Assessment/Plan: Diverticulitis, ?pSBO -will do fulls. Supplements -she may have some pain for a while but normal wbc would not do anything else. If does well on fulls can even go home on that and advance at home. She has longstanding bowel habit issues.  Would just need oral abx course and  follow up -lovenox  -no role for surgery -she has had csc before she states -invanz  I reviewed hospitalist notes, last 24 h vitals and pain scores, last 48 h intake and output, last 24 h labs and trends, and last 24 h imaging results.  Amy Mendoza 01/06/2024

## 2024-01-07 DIAGNOSIS — K567 Ileus, unspecified: Secondary | ICD-10-CM | POA: Diagnosis not present

## 2024-01-07 DIAGNOSIS — K5792 Diverticulitis of intestine, part unspecified, without perforation or abscess without bleeding: Secondary | ICD-10-CM | POA: Diagnosis not present

## 2024-01-07 MED ORDER — AMOXICILLIN-POT CLAVULANATE 875-125 MG PO TABS
1.0000 | ORAL_TABLET | Freq: Two times a day (BID) | ORAL | Status: DC
  Administered 2024-01-07 – 2024-01-08 (×3): 1 via ORAL
  Filled 2024-01-07 (×3): qty 1

## 2024-01-07 NOTE — Progress Notes (Signed)
 PROGRESS NOTE    Amy Mendoza  FMW:[id number] DOB: 11-Jul-1981 DOA: 12/31/2023 PCP: Vicci Barnie NOVAK, MD   Brief Narrative: Amy Mendoza is a 43 y.o. female with a history of anxiety, asthma, chronic headache, COVID-19, cholelithiasis, gastritis, GERD, hyperlipidemia, nephrolithiasis.  Patient presented secondary to abdominal pain and was found to have evidence of acute diverticulitis.  Empiric antibiotics and bowel rest initiated on admission.  Patient's symptoms managed with analgesics as needed.  Diet advanced as tolerated, however patient with intermittent nausea with some vomiting. Abdominal x-ray suggests ileus versus partial bowel obstruction.   Assessment and Plan:  Acute diverticulitis Patient with pain and leukocytosis.  CT abdomen/pelvis significant for acute diverticulitis involving the distal descending colon and sigmoid colon junction with associated moderate amount of residual fecal material throughout the colon without obstruction or constipation.  Patient started empirically on Zosyn  IV for management in addition to bowel rest and analgesics.  Diet advanced but patient had an episode of emesis; diet restricted to NPO.  Leukocytosis improving. Patient's abdominal pain and intermittent emesis/nausea persisting. Abdominal x-ray obtained which is concerning for ileus versus partial small bowel obstruction. Stat CT abdomen/pelvis ordered on 6/20 and returned significant for evidence of possible small bowel obstruction. - General surgery recommendations: Augmentin , full liquid diet  Possible partial small bowel obstruction Noted on abdominal x-ray and CT imaging. General surgery consulted. -General surgery recommendations: conservative management. Advancing diet  Hypokalemia Mild with potassium of 3.4. Improved with potassium supplementation. Resolved.  Hyponatremia Associated hypochloremia. Resolved with IV fluids.  Hypokalemia Resolved with potassium  supplementation.  Elevated bilirubin Mildly elevated at 2.2 peak.  Unclear etiology but likely from acute illness..  Trended down to 0.9. Resolved.  Mild intermittent asthma Noted.  Asymptomatic.  Hyperlipidemia Noted.  Patient is not on medication therapy as an outpatient.  OSA Patient is nonadherent with CPAP as an outpatient.  Obesity, class I Estimated body mass index is 32.64 kg/m as calculated from the following:   Height as of this encounter: 5' 9 (1.753 m).   Weight as of this encounter: 100.3 kg.   DVT prophylaxis: Lovenox  Code Status:   Code Status: Full Code Family Communication: None at bedside Disposition Plan: Discharge home pending general surgery recommendations   Consultants:  General surgery  Procedures:  None  Antimicrobials: Zosyn  IV Ertapenem Augmentin    Subjective: Still with some abdominal pain. Patient reports having a bowel movement.  Objective: BP 113/78 (BP Location: Right Arm)   Pulse 80   Temp 98.2 F (36.8 C) (Oral)   Resp 18   Ht 5' 9 (1.753 m)   Wt 100.3 kg   SpO2 97%   BMI 32.64 kg/m   Examination:  General exam: Appears calm and comfortable Respiratory system: Clear to auscultation. Respiratory effort normal. Cardiovascular system: S1 & S2 heard, RRR. No murmurs, rubs, gallops or clicks. Gastrointestinal system: Abdomen is slightly distended, soft and nontender. Normal bowel sounds heard. Central nervous system: Alert and oriented. No focal neurological deficits. Psychiatry: Judgement and insight appear normal. Mood & affect appropriate.    Data Reviewed: I have personally reviewed following labs and imaging studies  CBC Lab Results  Component Value Date   WBC 7.9 01/05/2024   RBC 3.57 (L) 01/05/2024   HGB 10.2 (L) 01/05/2024   HCT 32.3 (L) 01/05/2024   MCV 90.5 01/05/2024   MCH 28.6 01/05/2024   PLT 232 01/05/2024   MCHC 31.6 01/05/2024   RDW 11.9 01/05/2024   LYMPHSABS 1.4 01/02/2024  MONOABS 0.8  01/02/2024   EOSABS 0.1 01/02/2024   BASOSABS 0.0 01/02/2024     Last metabolic panel Lab Results  Component Value Date   NA 137 01/06/2024   K 3.6 01/06/2024   CL 102 01/06/2024   CO2 23 01/06/2024   BUN 6 01/06/2024   CREATININE 0.64 01/06/2024   GLUCOSE 92 01/06/2024   GFRNONAA >60 01/06/2024   GFRAA >60 02/03/2020   CALCIUM 8.9 01/06/2024   PHOS 3.1 07/08/2019   PROT 5.4 (L) 01/05/2024   ALBUMIN 2.2 (L) 01/05/2024   BILITOT 0.9 01/05/2024   ALKPHOS 43 01/05/2024   AST 9 (L) 01/05/2024   ALT 11 01/05/2024   ANIONGAP 12 01/06/2024    GFR: Estimated Creatinine Clearance: 116.6 mL/min (by C-G formula based on SCr of 0.64 mg/dL).  Recent Results (from the past 240 hours)  Blood Culture (routine x 2)     Status: None   Collection Time: 12/31/23  7:12 AM   Specimen: BLOOD  Result Value Ref Range Status   Specimen Description   Final    BLOOD RIGHT ANTECUBITAL Performed at [facility], [address] [place], [place] [address]    Special Requests   Final    BOTTLES DRAWN AEROBIC AND ANAEROBIC Blood Culture results may not be optimal due to an inadequate volume of blood received in culture bottles Performed at [facility], [address], [place], [place] [address]    Culture   Final    NO GROWTH 5 DAYS Performed at [facility], [address], [place], [place] [address]    Report Status 01/05/2024 FINAL  Final  Blood Culture (routine x 2)     Status: None   Collection Time: 12/31/23  8:04 AM   Specimen: BLOOD  Result Value Ref Range Status   Specimen Description   Final    BLOOD LEFT ANTECUBITAL Performed at [facility], [address] [place], [place] [address]    Special Requests   Final    BOTTLES DRAWN AEROBIC AND ANAEROBIC Blood Culture results may not be optimal due to an inadequate volume of blood received in culture bottles Performed at [facility], [address], [place], [place] [address]    Culture   Final    NO GROWTH 5 DAYS Performed at [facility], [address], [place], [place] [address]    Report Status 01/05/2024 FINAL  Final      Radiology Studies: CT ABDOMEN PELVIS WO CONTRAST Result Date: 01/05/2024 CLINICAL DATA:  Bowel obstruction suspected.  Abdominal pain EXAM: CT ABDOMEN AND PELVIS WITHOUT CONTRAST TECHNIQUE: Multidetector CT imaging of the abdomen and pelvis was performed following the standard protocol without IV contrast. RADIATION DOSE REDUCTION: This exam was performed according to the departmental dose-optimization program which includes automated exposure control, adjustment of the mA and/or kV according to patient size and/or use of iterative reconstruction technique. COMPARISON:  CT 12/31/2023 FINDINGS: Lower chest: Lung bases are clear. Hepatobiliary: No focal hepatic lesion. Postcholecystectomy. No biliary dilatation. Pancreas: Pancreas is normal. No ductal dilatation. No pancreatic inflammation. Spleen: Normal spleen Adrenals/urinary tract: Adrenal glands and kidneys are normal. The ureters and bladder normal. Stomach/Bowel: Postsurgical change in the stomach suggesting gastric sleeve bariatric surgery. Contrast is present within the duodenum and proximal small bowel. There is dilatation of the proximal small bowel. There is a focus of small bowel wall thickening in the LEFT mid abdomen (image 56/series 10).  This small bowel thickening partially obstructs the progression of oral contrast. The distal small bowel does have oral contrast but is reduced in caliber. This focal small bowel inflammation and stenosis is immediately adjacent to acute diverticulitis of the descending colon described on comparison CT. On comparison CT there was early small bowel edema through this region. The edema has increased now with partial obstruction. There are several pockets of gas at this site (image 58/7) which presumably within the bowel  wall. Cannot completely exclude a contained perforation of although less favored. Terminal ileum is normal. Appendix not identified. Ascending and transverse colon normal. Acute diverticulitis of the mid sigmoid colon as described above. Diverticulosis of the distal sigmoid colon. Rectum normal. There is no evidence of extra intraperitoneal free air. Vascular/Lymphatic: Abdominal aorta is normal caliber. No periportal or retroperitoneal adenopathy. No pelvic adenopathy. Reproductive: Uterus and adnexa unremarkable. Other: No free fluid. Musculoskeletal: No aggressive osseous lesion. IMPRESSION: 1. Progressive inflammatory reaction in the peritoneal space involving the mid sigmoid colon diverticulitis now with partial obstruction of the small bowel. 2. Partial small bowel obstruction secondary to focal small bowel wall thickening and luminal narrowing in the LEFT mid abdomen. This is immediately adjacent to acute diverticulitis of the mid sigmoid colon. Findings progressive over CT 12/31/2023. 3. Several pockets of gas at this site are favored to be within the bowel wall. Cannot completely exclude a contained perforation related to the sigmoid diverticulitis. 4. No evidence of extra intraperitoneal free air. 5. Postsurgical change in the stomach suggesting gastric sleeve bariatric surgery. Electronically Signed   By: Jackquline Boxer M.D.   On: 01/05/2024 11:20      LOS: 7 days    Elgin Lam, MD [facility] 01/07/2024, 10:47 AM   If 7PM-7AM, please contact night-coverage [email or web address]

## 2024-01-07 NOTE — Plan of Care (Signed)
   Problem: Education: Goal: Knowledge of General Education information will improve Description Including pain rating scale, medication(s)/side effects and non-pharmacologic comfort measures Outcome: Progressing   Problem: Health Behavior/Discharge Planning: Goal: Ability to manage health-related needs will improve Outcome: Progressing

## 2024-01-07 NOTE — Progress Notes (Signed)
 Subjective/Chief Complaint: Overall doing much better, only taking tylenol  for pain today and yesterday. Tolerating FLD with a slight increase in abdominal discomfort after PO intake. No nausea or vomiting. Reports flatus and a small BM. Reports some abdominal squeezing/pain after she urinates. 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 Reports history of multiple colonoscopies, most recent just over 5 years she think, at [facility] [employer]   Objective: Vital signs in last 24 hours: Temp:  [97.8 F (36.6 C)-98.2 F (36.8 C)] 98.2 F (36.8 C) (06/23 0545) Pulse Rate:  [75-80] 80 (06/22 2120) Resp:  [17-18] 18 (06/23 0545) BP: (110-129)/(61-78) 113/78 (06/23 0545) SpO2:  [97 %] 97 % (06/23 0545) Weight:  [100.3 kg] 100.3 kg (06/23 0707) Last BM Date : 01/06/24  Intake/Output from previous day: 06/22 0701 - 06/23 0700 In: 1390 [P.O.:1290; IV Piggyback:100] Out: -  Intake/Output this shift: No intake/output data recorded.  Gen: alert, up in chair, NAD Abd: soft, focally tender in LLQ without rebound or guarding  Lab Results:  Recent Labs    01/05/24 0516  WBC 7.9  HGB 10.2*  HCT 32.3*  PLT 232   BMET Recent Labs    01/05/24 1720 01/06/24 0535  NA 136 137  K 4.5 3.6  CL 101 102  CO2 22 23  GLUCOSE 63* 92  BUN 7 6  CREATININE 0.74 0.64  CALCIUM 8.7* 8.9   PT/INR No results for input(s): LABPROT, INR in the last 72 hours. ABG No results for input(s): PHART, HCO3 in the last 72 hours.  Invalid input(s): PCO2, PO2  Studies/Results: CT ABDOMEN PELVIS WO CONTRAST Result Date: 01/05/2024 CLINICAL DATA:  Bowel obstruction suspected.  Abdominal pain EXAM: CT ABDOMEN AND PELVIS WITHOUT CONTRAST TECHNIQUE: Multidetector CT imaging of the abdomen and pelvis was performed following the standard protocol without IV contrast. RADIATION DOSE REDUCTION: This exam was performed according to the departmental dose-optimization program which includes automated exposure control,  adjustment of the mA and/or kV according to patient size and/or use of iterative reconstruction technique. COMPARISON:  CT 12/31/2023 FINDINGS: Lower chest: Lung bases are clear. Hepatobiliary: No focal hepatic lesion. Postcholecystectomy. No biliary dilatation. Pancreas: Pancreas is normal. No ductal dilatation. No pancreatic inflammation. Spleen: Normal spleen Adrenals/urinary tract: Adrenal glands and kidneys are normal. The ureters and bladder normal. Stomach/Bowel: Postsurgical change in the stomach suggesting gastric sleeve bariatric surgery. Contrast is present within the duodenum and proximal small bowel. There is dilatation of the proximal small bowel. There is a focus of small bowel wall thickening in the LEFT mid abdomen (image 56/series 10). This small bowel thickening partially obstructs the progression of oral contrast. The distal small bowel does have oral contrast but is reduced in caliber. This focal small bowel inflammation and stenosis is immediately adjacent to acute diverticulitis of the descending colon described on comparison CT. On comparison CT there was early small bowel edema through this region. The edema has increased now with partial obstruction. There are several pockets of gas at this site (image 58/7) which presumably within the bowel wall. Cannot completely exclude a contained perforation of although less favored. Terminal ileum is normal. Appendix not identified. Ascending and transverse colon normal. Acute diverticulitis of the mid sigmoid colon as described above. Diverticulosis of the distal sigmoid colon. Rectum normal. There is no evidence of extra intraperitoneal free air. Vascular/Lymphatic: Abdominal aorta is normal caliber. No periportal or retroperitoneal adenopathy. No pelvic adenopathy. Reproductive: Uterus and adnexa unremarkable. Other: No free fluid. Musculoskeletal: No aggressive osseous lesion.  IMPRESSION: 1. Progressive inflammatory reaction in the peritoneal  space involving the mid sigmoid colon diverticulitis now with partial obstruction of the small bowel. 2. Partial small bowel obstruction secondary to focal small bowel wall thickening and luminal narrowing in the LEFT mid abdomen. This is immediately adjacent to acute diverticulitis of the mid sigmoid colon. Findings progressive over CT 12/31/2023. 3. Several pockets of gas at this site are favored to be within the bowel wall. Cannot completely exclude a contained perforation related to the sigmoid diverticulitis. 4. No evidence of extra intraperitoneal free air. 5. Postsurgical change in the stomach suggesting gastric sleeve bariatric surgery. Electronically Signed   By: Jackquline Boxer M.D.   On: 01/05/2024 11:20    Anti-infectives: Anti-infectives (From admission, onward)    Start     Dose/Rate Route Frequency Ordered Stop   01/07/24 1000  amoxicillin -clavulanate (AUGMENTIN ) 875-125 MG per tablet 1 tablet        1 tablet Oral Every 12 hours 01/07/24 0907     01/06/24 0900  ertapenem (INVANZ) 1 g in sodium chloride  0.9 % 100 mL IVPB  Status:  Discontinued        1 g 200 mL/hr over 30 Minutes Intravenous Daily 01/06/24 0815 01/07/24 0907   12/31/23 1700  piperacillin -tazobactam (ZOSYN ) IVPB 3.375 g  Status:  Discontinued        3.375 g 12.5 mL/hr over 240 Minutes Intravenous Every 8 hours 12/31/23 1359 01/06/24 0815   12/31/23 0900  piperacillin -tazobactam (ZOSYN ) IVPB 3.375 g        3.375 g 100 mL/hr over 30 Minutes Intravenous  Once 12/31/23 0846 12/31/23 9060       Assessment/Plan: Diverticulitis, possible pSBO due to reactive inflammatory changes of adjacent loop of small bowel - continue FLD and supplements -overall pain is improved. She has longstanding bowel habit issues. Transition to PO abx today and if her pain remains stable/improved plan discharge home tomorrow with oral abx course and GI/surgical follow up -lovenox   I reviewed hospitalist notes, last 24 h vitals and pain  scores, last 48 h intake and output, last 24 h labs and trends, and last 24 h imaging results.  Almarie GORMAN Pringle 01/07/2024

## 2024-01-08 DIAGNOSIS — K5792 Diverticulitis of intestine, part unspecified, without perforation or abscess without bleeding: Secondary | ICD-10-CM | POA: Diagnosis not present

## 2024-01-08 MED ORDER — AMOXICILLIN-POT CLAVULANATE 875-125 MG PO TABS: 1.0000 | ORAL_TABLET | Freq: Two times a day (BID) | ORAL | 0 refills | Status: AC

## 2024-01-08 MED ORDER — ACETAMINOPHEN 500 MG PO TABS: 500.0000 mg | ORAL_TABLET | Freq: Three times a day (TID) | ORAL | Status: AC | PRN

## 2024-01-08 NOTE — Plan of Care (Signed)
   Problem: Education: Goal: Knowledge of General Education information will improve Description Including pain rating scale, medication(s)/side effects and non-pharmacologic comfort measures Outcome: Progressing   Problem: Health Behavior/Discharge Planning: Goal: Ability to manage health-related needs will improve Outcome: Progressing

## 2024-01-08 NOTE — Discharge Instructions (Signed)
 Norvel LITTIE Ply,

## 2024-01-08 NOTE — Discharge Summary (Signed)
 Physician Discharge Summary   Patient: Amy Mendoza MRN: [id number] DOB: 1981-02-11  Admit date:     12/31/2023  Discharge date: 01/08/24  Discharge Physician: Elgin Lam, MD   PCP: Vicci Barnie NOVAK, MD   Recommendations at discharge:  PCP visit for hospital follow-up General Surgery follow-up for consideration of surgical management if needed Gastroenterology follow-up for consideration of colonoscopy in 4 to 6 weeks  Discharge Diagnoses: Principal Problem:   Acute diverticulitis Active Problems:   Mild intermittent asthma   Gastroesophageal reflux disease   Hyperlipidemia   OSA (obstructive sleep apnea)   Class 1 obesity  Resolved Problems:   * No resolved hospital problems. *  Hospital Course: Amy Mendoza is a 43 y.o. female with a history of anxiety, asthma, chronic headache, COVID-19, cholelithiasis, gastritis, GERD, hyperlipidemia, nephrolithiasis.  Patient presented secondary to abdominal pain and was found to have evidence of acute diverticulitis.  Empiric antibiotics and bowel rest initiated on admission.  Patient's symptoms managed with analgesics as needed.  Diet advanced as tolerated, however patient with intermittent nausea with some vomiting. Abdominal x-ray suggests ileus versus partial bowel obstruction.  CT scan suggest possible partial small bowel obstruction related to adjacent inflammation from diverticulitis.  General surgery was consulted with recommendations for continued conservative management.  Patient's diet was advanced slowly with ability and symptoms.  Patient with ongoing bowel function with bowel movement produced.  General surgery recommendations for outpatient follow-up with GI and general surgery.  Assessment and Plan:  Acute diverticulitis Patient with pain and leukocytosis.  CT abdomen/pelvis significant for acute diverticulitis involving the distal descending colon and sigmoid colon junction with associated moderate amount of  residual fecal material throughout the colon without obstruction or constipation.  Patient started empirically on Zosyn  IV for management in addition to bowel rest and analgesics.  Diet advanced but patient had an episode of emesis; diet restricted to NPO.  Leukocytosis improving. Patient's abdominal pain and intermittent emesis/nausea persisting. Abdominal x-ray obtained which is concerning for ileus versus partial small bowel obstruction. Stat CT abdomen/pelvis ordered on 6/20 and returned significant for evidence of possible small bowel obstruction. Patient discharged on antibiotics to complete a 14-day course of antibiotics.  Possible partial small bowel obstruction Noted on abdominal x-ray and CT imaging. General surgery consulted and recommended continued conservative management. Diet advanced slowly to full liquid with recommendation from general surgery for discharge and outpatient follow-up.   Hypokalemia Mild with potassium of 3.4. Improved with potassium supplementation. Resolved.   Hyponatremia Associated hypochloremia. Resolved with IV fluids.   Hypokalemia Resolved with potassium supplementation.   Elevated bilirubin Mildly elevated at 2.2 peak.  Unclear etiology but likely from acute illness..  Trended down to 0.9. Resolved.   Mild intermittent asthma Noted.  Asymptomatic.   Hyperlipidemia Noted.  Patient is not on medication therapy as an outpatient.   OSA Patient is nonadherent with CPAP as an outpatient.   Obesity, class I Estimated body mass index is 32.64 kg/m as calculated from the following:   Height as of this encounter: 5' 9 (1.753 m).   Weight as of this encounter: 100.3 kg.   Consultants:  General surgery   Procedures:  None   Disposition: Home Diet recommendation: Full liquid diet with eventual advancement to soft diet  DISCHARGE MEDICATION: Allergies as of 01/08/2024       Reactions   Metformin And Related Diarrhea   Sertraline  Anxiety, Other  (See Comments)   Pt states it made her crazy/worsened anxiety  Medication List     STOP taking these medications    oxyCODONE -acetaminophen  10-325 MG tablet Commonly known as: PERCOCET       TAKE these medications    acetaminophen  500 MG tablet Commonly known as: TYLENOL  Take 1-2 tablets (500-1,000 mg total) by mouth every 8 (eight) hours as needed for up to 10 days for mild pain (pain score 1-3), fever or moderate pain (pain score 4-6).   amoxicillin -clavulanate 875-125 MG tablet Commonly known as: AUGMENTIN  Take 1 tablet by mouth every 12 (twelve) hours for 6 days.   cyclobenzaprine  10 MG tablet Commonly known as: FLEXERIL  Take 10 mg by mouth 3 (three) times daily as needed for muscle spasms.   dicyclomine  10 MG capsule Commonly known as: BENTYL  Take 10 mg by mouth 3 (three) times daily as needed for spasms.   gabapentin 100 MG capsule Commonly known as: NEURONTIN Take 100 mg by mouth 3 (three) times daily as needed (for pain).   IBU-200 200 MG tablet Generic drug: ibuprofen  Take 200-400 mg by mouth every 6 (six) hours as needed for moderate pain (pain score 4-6).   ketoconazole  2 % shampoo Commonly known as: NIZORAL  Apply 1 Application topically 2 (two) times a week.   omeprazole  20 MG capsule Commonly known as: PRILOSEC Take 20 mg by mouth every morning.   ondansetron  4 MG disintegrating tablet Commonly known as: ZOFRAN -ODT Take 4 mg by mouth every 8 (eight) hours as needed for nausea or vomiting.   phentermine 37.5 MG capsule Take 37.5 mg by mouth every morning.   VITAMIN D PO Take 1 tablet by mouth daily.   ZyrTEC Allergy 10 MG tablet Generic drug: cetirizine Take 10 mg by mouth daily.        Follow-up Information     Surgery, [facility]. Call.   Specialty: General Surgery Why: As needed, after colonoscopy, if you would like to discuss surgery for diverticulitis. Contact information: [address] [place]  [address] [phone]         Vicci Barnie NOVAK, MD. Schedule an appointment as soon as possible for a visit in 1 week(s).   Specialty: Internal Medicine Why: For hospital follow-up Contact information: [address] [place] [address] [phone]         [facility]. Schedule an appointment as soon as possible for a visit in 4 week(s).   Specialty: Gastroenterology Why: Colonoscopy Contact information: [address] [place]  [address] [phone]               Discharge Exam: BP 100/65 (BP Location: Right Arm)   Pulse 81   Temp 98.4 F (36.9 C) (Oral)   Resp 16   Ht 5' 9 (1.753 m)   Wt 100.3 kg   SpO2 97%   BMI 32.64 kg/m   General exam: Appears calm and comfortable Respiratory system: Clear to auscultation. Respiratory effort normal. Cardiovascular system: S1 & S2 heard, RRR. No murmurs, rubs, gallops or clicks. Gastrointestinal system: Abdomen is nondistended, soft and nontender. Normal bowel sounds heard. Central nervous system: Alert and oriented. No focal neurological deficits. Musculoskeletal: No edema. No calf tenderness Skin: No cyanosis. No rashes Psychiatry: Judgement and insight appear normal. Mood & affect appropriate.   Condition at discharge: stable  The results of significant diagnostics from this hospitalization (including imaging, microbiology, ancillary and laboratory) are listed below for reference.   Imaging Studies: CT ABDOMEN PELVIS WO CONTRAST Result Date: 01/05/2024 CLINICAL  DATA:  Bowel obstruction suspected.  Abdominal pain EXAM: CT ABDOMEN AND PELVIS WITHOUT CONTRAST TECHNIQUE: Multidetector CT imaging of the abdomen and pelvis was performed following the standard protocol without IV contrast. RADIATION DOSE REDUCTION: This exam was performed according to the departmental dose-optimization program which includes automated exposure control, adjustment of the mA  and/or kV according to patient size and/or use of iterative reconstruction technique. COMPARISON:  CT 12/31/2023 FINDINGS: Lower chest: Lung bases are clear. Hepatobiliary: No focal hepatic lesion. Postcholecystectomy. No biliary dilatation. Pancreas: Pancreas is normal. No ductal dilatation. No pancreatic inflammation. Spleen: Normal spleen Adrenals/urinary tract: Adrenal glands and kidneys are normal. The ureters and bladder normal. Stomach/Bowel: Postsurgical change in the stomach suggesting gastric sleeve bariatric surgery. Contrast is present within the duodenum and proximal small bowel. There is dilatation of the proximal small bowel. There is a focus of small bowel wall thickening in the LEFT mid abdomen (image 56/series 10). This small bowel thickening partially obstructs the progression of oral contrast. The distal small bowel does have oral contrast but is reduced in caliber. This focal small bowel inflammation and stenosis is immediately adjacent to acute diverticulitis of the descending colon described on comparison CT. On comparison CT there was early small bowel edema through this region. The edema has increased now with partial obstruction. There are several pockets of gas at this site (image 58/7) which presumably within the bowel wall. Cannot completely exclude a contained perforation of although less favored. Terminal ileum is normal. Appendix not identified. Ascending and transverse colon normal. Acute diverticulitis of the mid sigmoid colon as described above. Diverticulosis of the distal sigmoid colon. Rectum normal. There is no evidence of extra intraperitoneal free air. Vascular/Lymphatic: Abdominal aorta is normal caliber. No periportal or retroperitoneal adenopathy. No pelvic adenopathy. Reproductive: Uterus and adnexa unremarkable. Other: No free fluid. Musculoskeletal: No aggressive osseous lesion. IMPRESSION: 1. Progressive inflammatory reaction in the peritoneal space involving the mid  sigmoid colon diverticulitis now with partial obstruction of the small bowel. 2. Partial small bowel obstruction secondary to focal small bowel wall thickening and luminal narrowing in the LEFT mid abdomen. This is immediately adjacent to acute diverticulitis of the mid sigmoid colon. Findings progressive over CT 12/31/2023. 3. Several pockets of gas at this site are favored to be within the bowel wall. Cannot completely exclude a contained perforation related to the sigmoid diverticulitis. 4. No evidence of extra intraperitoneal free air. 5. Postsurgical change in the stomach suggesting gastric sleeve bariatric surgery. Electronically Signed   By: Jackquline Boxer M.D.   On: 01/05/2024 11:20   DG Abd 2 Views Result Date: 01/04/2024 CLINICAL DATA:  Abdomen pain EXAM: ABDOMEN - 2 VIEW COMPARISON:  CT 12/31/2023 FINDINGS: Postsurgical changes in the upper abdomen and clips in the right upper quadrant. Slightly limited erect view of the abdomen, no obvious free air. Interval air distension of multiple small bowel loops measuring up to 5.4 cm. Some colon gas is noted. Ligation clips in the pelvis IMPRESSION: Interval air distension of multiple small bowel loops measuring up to 5.4 cm. Findings could be secondary to ileus though developing or partial small bowel obstruction could also produce this appearance Electronically Signed   By: Luke Bun M.D.   On: 01/04/2024 16:36   CT ABDOMEN PELVIS W CONTRAST Result Date: 12/31/2023 CLINICAL DATA:  Abdominal pain EXAM: CT ABDOMEN AND PELVIS WITH CONTRAST TECHNIQUE: Multidetector CT imaging of the abdomen and pelvis was performed using the standard protocol following bolus administration of intravenous contrast.  RADIATION DOSE REDUCTION: This exam was performed according to the departmental dose-optimization program which includes automated exposure control, adjustment of the mA and/or kV according to patient size and/or use of iterative reconstruction technique.  CONTRAST:  OMNIPAQUE  IOHEXOL  300 MG/ML  SOLN COMPARISON:  October 02, 2022 FINDINGS: Lower chest: No infiltrates or consolidations, no pleural effusions Hepatobiliary: No focal liver abnormality is seen. Status post cholecystectomy. No biliary dilatation. Prior bariatric gastric surgical procedure. Small anterior abdominal wall umbilical hernia containing fat only. Pancreas: Pancreas normal size. No masses calcifications or inflammatory changes. Spleen: Spleen normal size.  No masses. Adrenals/Urinary Tract: Adrenal glands are normal size. Follow-up recommended. Kidneys are normal. No masses calcifications or hydronephrosis Stomach/Bowel: No small or large bowel obstruction Inflammatory changes involving the distal descending colon sigmoid colon junction with inflammatory changes of the pericolonic soft tissues and inflammatory changes of the adjacent small bowel loop anterior to the sigmoid colon findings consistent with a acute diverticulitis stage 1-2) (0 4) Moderate amount of residual fecal material throughout the colon without obstruction or constipation. Vascular/Lymphatic: No significant vascular findings are present. No enlarged abdominal or pelvic lymph nodes. Reproductive: .  No masses. Bladder unremarkable. Other: Anterior abdominal wall unremarkable without evidence of umbilical or inguinal hernias Musculoskeletal: Visualized portion of the thoracolumbar spine and pelvic structures grossly unremarkable without evidence of fracture bony abnormalities or soft tissue masses. IMPRESSION: *Acute diverticulitis stage 1-2) involving the distal descending colon sigmoid colon junction. *Moderate amount of residual fecal material throughout the colon without obstruction or constipation. *Status post cholecystectomy. *Prior bariatric gastric surgical procedure. *Small anterior abdominal wall umbilical hernia containing fat only. Electronically Signed   By: Franky Chard M.D.   On: 12/31/2023 08:32     Microbiology: Results for orders placed or performed during the hospital encounter of 12/31/23  Blood Culture (routine x 2)     Status: None   Collection Time: 12/31/23  7:12 AM   Specimen: BLOOD  Result Value Ref Range Status   Specimen Description   Final    BLOOD RIGHT ANTECUBITAL Performed at [facility], [address], [place], [place] [address]    Special Requests   Final    BOTTLES DRAWN AEROBIC AND ANAEROBIC Blood Culture results may not be optimal due to an inadequate volume of blood received in culture bottles Performed at [facility], [address], [place], [place] [address]    Culture   Final    NO GROWTH 5 DAYS Performed at [facility], [address], [place], [place] [address]    Report Status 01/05/2024 FINAL  Final  Blood Culture (routine x 2)     Status: None   Collection Time: 12/31/23  8:04 AM   Specimen: BLOOD  Result Value Ref Range Status   Specimen Description   Final    BLOOD LEFT ANTECUBITAL Performed at [facility], [address], [place], [place] [address]    Special Requests   Final    BOTTLES DRAWN AEROBIC AND ANAEROBIC Blood Culture results may not be optimal due to an inadequate volume of blood received in culture bottles Performed at [facility], [address], [place], [place] [address]    Culture   Final    NO GROWTH 5 DAYS Performed at [facility], [address] [place], [place] [address]    Report Status 01/05/2024 FINAL  Final    Labs: CBC: Recent Labs  Lab 01/02/24 0444  01/03/24 0454 01/05/24 0516  WBC 13.8* 11.1* 7.9  NEUTROABS 11.3*  --   --   HGB 11.9* 11.9* 10.2*  HCT 37.7 38.1 32.3*  MCV 90.6 91.6 90.5  PLT 211 230 232   Basic Metabolic Panel: Recent Labs  Lab 01/02/24 0444 01/03/24 0454 01/05/24 0516 01/05/24 1720 01/06/24 0535 01/06/24 1328  NA 133* 140 129* 136 137  --   K 3.4* 3.5 2.9* 4.5 3.6  --   CL  101 105 95* 101 102  --   CO2 25 25 20* 22 23  --   GLUCOSE 96 80 72 63* 92  --   BUN 5* 6 5* 7 6  --   CREATININE 0.55 0.83 0.63 0.74 0.64  --   CALCIUM 8.3* 8.6* 7.3* 8.7* 8.9  --   MG 1.8  --  1.4*  --   --  1.8   Liver Function Tests: Recent Labs  Lab 01/02/24 0444 01/03/24 0454 01/05/24 0516  AST 12* 10* 9*  ALT 14 13 11   ALKPHOS 61 56 43  BILITOT 1.5* 1.0 0.9  PROT 6.7 6.6 5.4*  ALBUMIN 2.8* 2.7* 2.2*    Discharge time spent: 35 minutes.  Signed: Elgin Lam, MD [facility] 01/08/2024

## 2024-01-08 NOTE — Progress Notes (Signed)
   Subjective/Chief Complaint: Feeling better. Pain improving. Tolerating FLD without pain, nausea, or vomiting. Walking the halls. Reports a good BM yesterday  Objective: Vital signs in last 24 hours: Temp:  [98.2 F (36.8 C)-98.8 F (37.1 C)] 98.4 F (36.9 C) (06/24 0449) Pulse Rate:  [74-81] 81 (06/24 0449) Resp:  [16-18] 16 (06/24 0449) BP: (100-154)/(65-130) 100/65 (06/24 0449) SpO2:  [97 %-99 %] 97 % (06/24 0449) Last BM Date : 01/07/24  Intake/Output from previous day: 06/23 0701 - 06/24 0700 In: 1200 [P.O.:1200] Out: -  Intake/Output this shift: No intake/output data recorded.  Gen: alert, up in chair, NAD Abd: soft, focally tender in LLQ without rebound or guarding  Lab Results:  No results for input(s): WBC, HGB, HCT, PLT in the last 72 hours.  BMET Recent Labs    01/05/24 1720 01/06/24 0535  NA 136 137  K 4.5 3.6  CL 101 102  CO2 22 23  GLUCOSE 63* 92  BUN 7 6  CREATININE 0.74 0.64  CALCIUM 8.7* 8.9   PT/INR No results for input(s): LABPROT, INR in the last 72 hours. ABG No results for input(s): PHART, HCO3 in the last 72 hours.  Invalid input(s): PCO2, PO2  Studies/Results: No results found.   Anti-infectives: Anti-infectives (From admission, onward)    Start     Dose/Rate Route Frequency Ordered Stop   01/07/24 1000  amoxicillin -clavulanate (AUGMENTIN ) 875-125 MG per tablet 1 tablet        1 tablet Oral Every 12 hours 01/07/24 0907     01/06/24 0900  ertapenem (INVANZ) 1 g in sodium chloride  0.9 % 100 mL IVPB  Status:  Discontinued        1 g 200 mL/hr over 30 Minutes Intravenous Daily 01/06/24 0815 01/07/24 0907   12/31/23 1700  piperacillin -tazobactam (ZOSYN ) IVPB 3.375 g  Status:  Discontinued        3.375 g 12.5 mL/hr over 240 Minutes Intravenous Every 8 hours 12/31/23 1359 01/06/24 0815   12/31/23 0900  piperacillin -tazobactam (ZOSYN ) IVPB 3.375 g        3.375 g 100 mL/hr over 30 Minutes Intravenous  Once  12/31/23 0846 12/31/23 9060       Assessment/Plan: Diverticulitis, possible pSBO due to reactive inflammatory changes of adjacent loop of small bowel - continue FLD and supplements, advance diet to soft at home in the coming days;  - stable for discharge, would recommend a total of 14 days antibiotics. She needs to see GI for colonoscopy in 4-6 weeks. I offered her outpatient surgical consultation and she is wanting to avoid surgery. I advised her to call us  to schedule a consultation after her colonoscopy if she wants to discuss surgery further.   -lovenox   I reviewed hospitalist notes, last 24 h vitals and pain scores, last 48 h intake and output, last 24 h labs and trends, and last 24 h imaging results.  Amy Mendoza 01/08/2024

## 2024-01-13 ENCOUNTER — Emergency Department (HOSPITAL_COMMUNITY)

## 2024-01-13 ENCOUNTER — Emergency Department (HOSPITAL_COMMUNITY)
Admission: EM | Admit: 2024-01-13 | Discharge: 2024-01-13 | Disposition: A | Discharge status: Home / Self Care | Attending: Emergency Medicine | Admitting: Emergency Medicine

## 2024-01-13 ENCOUNTER — Encounter (HOSPITAL_COMMUNITY): Payer: Self-pay | Admitting: Emergency Medicine

## 2024-01-13 DIAGNOSIS — K632 Fistula of intestine: Secondary | ICD-10-CM | POA: Insufficient documentation

## 2024-01-13 DIAGNOSIS — R1032 Left lower quadrant pain: Secondary | ICD-10-CM | POA: Diagnosis present

## 2024-01-13 DIAGNOSIS — K5732 Diverticulitis of large intestine without perforation or abscess without bleeding: Secondary | ICD-10-CM | POA: Diagnosis not present

## 2024-01-13 DIAGNOSIS — K5792 Diverticulitis of intestine, part unspecified, without perforation or abscess without bleeding: Secondary | ICD-10-CM

## 2024-01-13 LAB — CBC WITH DIFFERENTIAL/PLATELET
Abs Immature Granulocytes: 0.02 10*3/uL (ref 0.00–0.07)
Basophils Absolute: 0 10*3/uL (ref 0.0–0.1)
Basophils Relative: 1 %
Eosinophils Absolute: 0.2 10*3/uL (ref 0.0–0.5)
Eosinophils Relative: 3 %
HCT: 41.7 % (ref 36.0–46.0)
Hemoglobin: 13.3 g/dL (ref 12.0–15.0)
Immature Granulocytes: 0 %
Lymphocytes Relative: 23 %
Lymphs Abs: 1.8 10*3/uL (ref 0.7–4.0)
MCH: 27.8 pg (ref 26.0–34.0)
MCHC: 31.9 g/dL (ref 30.0–36.0)
MCV: 87.2 fL (ref 80.0–100.0)
Monocytes Absolute: 0.3 10*3/uL (ref 0.1–1.0)
Monocytes Relative: 4 %
Neutro Abs: 5.4 10*3/uL (ref 1.7–7.7)
Neutrophils Relative %: 69 %
Platelets: 364 10*3/uL (ref 150–400)
RBC: 4.78 MIL/uL (ref 3.87–5.11)
RDW: 12.1 % (ref 11.5–15.5)
WBC: 7.8 10*3/uL (ref 4.0–10.5)
nRBC: 0 % (ref 0.0–0.2)

## 2024-01-13 LAB — COMPREHENSIVE METABOLIC PANEL WITH GFR
ALT: 35 U/L (ref 0–44)
AST: 20 U/L (ref 15–41)
Albumin: 3.4 g/dL — ABNORMAL LOW (ref 3.5–5.0)
Alkaline Phosphatase: 57 U/L (ref 38–126)
Anion gap: 8 (ref 5–15)
BUN: 7 mg/dL (ref 6–20)
CO2: 25 mmol/L (ref 22–32)
Calcium: 9.1 mg/dL (ref 8.9–10.3)
Chloride: 101 mmol/L (ref 98–111)
Creatinine, Ser: 0.62 mg/dL (ref 0.44–1.00)
GFR, Estimated: >60 mL/min (ref >60)
Glucose, Bld: 113 mg/dL — ABNORMAL HIGH (ref 70–99)
Potassium: 3.7 mmol/L (ref 3.5–5.1)
Sodium: 134 mmol/L — ABNORMAL LOW (ref 135–145)
Total Bilirubin: 0.4 mg/dL (ref 0.0–1.2)
Total Protein: 7.8 g/dL (ref 6.5–8.1)

## 2024-01-13 LAB — HCG, SERUM, QUALITATIVE: Preg, Serum: NEGATIVE

## 2024-01-13 MED ORDER — DICLOFENAC SODIUM ER 100 MG PO TB24
100.0000 mg | ORAL_TABLET | Freq: Every day | ORAL | 0 refills | Status: AC
Start: 2024-01-13 — End: ?

## 2024-01-13 MED ORDER — SODIUM CHLORIDE 0.9 % IV SOLN: INTRAVENOUS | Status: DC

## 2024-01-13 MED ORDER — IOHEXOL 300 MG/ML  SOLN
100.0000 mL | Freq: Once | INTRAMUSCULAR | Status: AC | PRN
  Administered 2024-01-13: 100 mL via INTRAVENOUS

## 2024-01-13 MED ORDER — ONDANSETRON 8 MG PO TBDP: ORAL_TABLET | ORAL | 0 refills | Status: AC

## 2024-01-13 MED ORDER — ONDANSETRON 4 MG PO TBDP: 4.0000 mg | ORAL_TABLET | Freq: Three times a day (TID) | ORAL | 0 refills | Status: AC | PRN

## 2024-01-13 MED ORDER — FENTANYL CITRATE PF 50 MCG/ML IJ SOSY
50.0000 ug | PREFILLED_SYRINGE | Freq: Once | INTRAMUSCULAR | Status: AC
  Administered 2024-01-13: 50 ug via INTRAVENOUS
  Filled 2024-01-13: qty 1

## 2024-01-13 MED ORDER — KETOROLAC TROMETHAMINE 30 MG/ML IJ SOLN
15.0000 mg | Freq: Once | INTRAMUSCULAR | Status: AC
  Administered 2024-01-13: 15 mg via INTRAVENOUS
  Filled 2024-01-13: qty 1

## 2024-01-13 MED ORDER — ONDANSETRON HCL 4 MG/2ML IJ SOLN
4.0000 mg | Freq: Once | INTRAMUSCULAR | Status: AC
  Administered 2024-01-13: 4 mg via INTRAVENOUS
  Filled 2024-01-13: qty 2

## 2024-01-13 MED ORDER — SODIUM CHLORIDE (PF) 0.9 % IJ SOLN
INTRAMUSCULAR | Status: AC
  Filled 2024-01-13: qty 50

## 2024-01-13 MED ORDER — AMOXICILLIN-POT CLAVULANATE 875-125 MG PO TABS: 1.0000 | ORAL_TABLET | Freq: Two times a day (BID) | ORAL | 0 refills | Status: DC

## 2024-01-13 NOTE — ED Provider Notes (Signed)
 [place] [facility]ERGENC[facility] DEPARTMENT AT [facility] Provider Note   CSN: [id number] Arrival date & time: 01/13/24  9952     Patient presents with: Abdominal Pain   Amy Mendoza is a 43 y.o. female.   The history is provided by the patient.  Abdominal Pain Pain location:  LLQ Pain radiates to:  Does not radiate Pain severity:  Severe Timing:  Constant Progression:  Unchanged Chronicity:  Recurrent Context: recent illness   Context: not trauma   Relieved by:  Nothing Worsened by:  Nothing Ineffective treatments:  None tried Associated symptoms: nausea   Associated symptoms: no constipation, no diarrhea and no fever   Risk factors: not pregnant   Patient with recent admission for diverticulitis and SBO presents with recurrent pain and nausea.  No fevers.       Prior to Admission medications   Medication Sig Start Date End Date Taking? Authorizing Provider  amoxicillin -clavulanate (AUGMENTIN ) 875-125 MG tablet Take 1 tablet by mouth every 12 (twelve) hours. 01/13/24  Yes Andrei Mccook, MD  Diclofenac Sodium CR 100 MG 24 hr tablet Take 1 tablet (100 mg total) by mouth daily. 01/13/24  Yes Taytum Wheller, MD  ondansetron  (ZOFRAN -ODT) 8 MG disintegrating tablet 8mg  ODT q4 hours prn nausea 01/13/24  Yes Analisa Sledd, MD  acetaminophen  (TYLENOL ) 500 MG tablet Take 1-2 tablets (500-1,000 mg total) by mouth every 8 (eight) hours as needed for up to 10 days for mild pain (pain score 1-3), fever or moderate pain (pain score 4-6). 01/08/24 01/18/24  Briana Elgin LABOR, MD  amoxicillin -clavulanate (AUGMENTIN ) 875-125 MG tablet Take 1 tablet by mouth every 12 (twelve) hours for 6 days. 01/08/24 01/14/24  Briana Elgin LABOR, MD  cyclobenzaprine  (FLEXERIL ) 10 MG tablet Take 10 mg by mouth 3 (three) times daily as needed for muscle spasms.    [provider]  dicyclomine  (BENTYL ) 10 MG capsule Take 10 mg by mouth 3 (three) times daily as needed for spasms. 06/07/22   [provider]  gabapentin (NEURONTIN) 100 MG capsule Take 100 mg by mouth 3 (three) times daily as needed (for pain).    [provider]  ibuprofen  (IBU-200) 200 MG tablet Take 200-400 mg by mouth every 6 (six) hours as needed for moderate pain (pain score 4-6).    [provider]  ketoconazole  (NIZORAL ) 2 % shampoo Apply 1 Application topically 2 (two) times a week. 11/19/23   Vicci Barnie NOVAK, MD  omeprazole  (PRILOSEC) 20 MG capsule Take 20 mg by mouth every morning. Patient not taking: Reported on 12/31/2023    [provider]  ondansetron  (ZOFRAN -ODT) 4 MG disintegrating tablet Take 1 tablet (4 mg total) by mouth every 8 (eight) hours as needed for nausea or vomiting. 01/13/24   Taishaun Levels, MD  phentermine 37.5 MG capsule Take 37.5 mg by mouth every morning. 12/12/23   [provider]  VITAMIN D PO Take 1 tablet by mouth daily.    [provider]  ZYRTEC ALLERGY 10 MG tablet Take 10 mg by mouth daily.    [provider]    Allergies: Metformin and related and Sertraline     Review of Systems  Constitutional:  Negative for fever.  Gastrointestinal:  Positive for abdominal pain and nausea. Negative for constipation and diarrhea.  All other systems reviewed and are negative.   Updated Vital Signs BP 103/62   Pulse 70   Temp 98.4 F (36.9 C) (Oral)   Resp 17   SpO2 97%  Physical Exam Vitals and nursing note reviewed.  Constitutional:      General: She is not in acute distress.    Appearance: Normal appearance. She is well-developed.  HENT:     Head: Normocephalic and atraumatic.     Nose: Nose normal.   Eyes:     Pupils: Pupils are equal, round, and reactive to light.    Cardiovascular:     Rate and Rhythm: Normal rate and regular rhythm.  Pulmonary:     Effort: No respiratory distress.     Breath sounds: Normal breath sounds.  Abdominal:     General: Bowel sounds are normal. There is no distension.      Palpations: Abdomen is soft.     Tenderness: There is no abdominal tenderness. There is no guarding or rebound.   Musculoskeletal:        General: Normal range of motion.     Cervical back: Neck supple.   Skin:    General: Skin is warm and dry.     Capillary Refill: Capillary refill takes less than 2 seconds.     Findings: No erythema or rash.   Neurological:     General: No focal deficit present.     Mental Status: She is alert and oriented to person, place, and time.     Deep Tendon Reflexes: Reflexes normal.   Psychiatric:        Mood and Affect: Mood normal.        Behavior: Behavior normal.     (all labs ordered are listed, but only abnormal results are displayed) Labs Reviewed  COMPREHENSIVE METABOLIC PANEL WITH GFR - Abnormal; Notable for the following components:      Result Value   Sodium 134 (*)    Glucose, Bld 113 (*)    Albumin 3.4 (*)    All other components within normal limits  CBC WITH DIFFERENTIAL/PLATELET  HCG, SERUM, QUALITATIVE    EKG: None  Radiology: CT ABDOMEN PELVIS W CONTRAST Result Date: 01/13/2024 CLINICAL DATA:  Blunt polytrauma EXAM: CT ABDOMEN AND PELVIS WITH CONTRAST TECHNIQUE: Multidetector CT imaging of the abdomen and pelvis was performed using the standard protocol following bolus administration of intravenous contrast. RADIATION DOSE REDUCTION: This exam was performed according to the departmental dose-optimization program which includes automated exposure control, adjustment of the mA and/or kV according to patient size and/or use of iterative reconstruction technique. CONTRAST:  OMNIPAQUE  IOHEXOL  300 MG/ML  SOLN COMPARISON:  CT 01/05/2024 FINDINGS: Lower chest: No acute abnormality. Hepatobiliary: Unremarkable liver. Normal gallbladder. No biliary dilation. Pancreas: Unremarkable. Spleen: Unremarkable. Adrenals/Urinary Tract: Normal adrenal glands. No urinary calculi or hydronephrosis. Bladder is unremarkable. Stomach/Bowel: Normal  caliber large and small bowel. Moderate colonic stool burden. Decreased stranding and fluid about the proximal sigmoid colon in the area of diverticulitis seen previously. Decreased edema and resolved dilation of the adjacent small bowel in the left hemiabdomen. There is residual mural thickening and hyperenhancement of the small bowel adjacent to the inflamed colon. There is a soft tissue tract extending between the inflamed small bowel and colon in the left hemiabdomen best appreciated on coronal image 60. An enterocolonic fistula is not excluded. Small amount of adjacent edema. No abscess. No free intraperitoneal air. The appendix is normal.  Postoperative change about the stomach. Vascular/Lymphatic: No significant vascular findings are present. No enlarged abdominal or pelvic lymph nodes. Reproductive: Uterus and bilateral adnexa are unremarkable. Tubal ligation clips. Other: Unchanged fat containing a periumbilical hernia. Musculoskeletal: No acute fracture. IMPRESSION:  1. Improving sigmoid diverticulitis compared to 01/05/2024. 2. Decreased inflammation of the adjacent small bowel with resolved obstruction. Soft tissue tract extends between the inflamed small bowel and sigmoid colon. Enterocolonic fistula is not excluded. 3. No acute traumatic injury in the abdomen or pelvis. Electronically Signed   By: Norman Gatlin M.D.   On: 01/13/2024 03:19     Procedures   Medications Ordered in the ED  0.9 %  sodium chloride  infusion ( Intravenous New Bag/Given 01/13/24 0145)  fentaNYL  (SUBLIMAZE ) injection 50 mcg (50 mcg Intravenous Given 01/13/24 0149)  ondansetron  (ZOFRAN ) injection 4 mg (4 mg Intravenous Given 01/13/24 0147)  iohexol  (OMNIPAQUE ) 300 MG/ML solution 100 mL (100 mLs Intravenous Contrast Given 01/13/24 0251)  ketorolac  (TORADOL ) 30 MG/ML injection 15 mg (15 mg Intravenous Given 01/13/24 0344)                                    Medical Decision Making Amount and/or Complexity of Data  Reviewed Labs: ordered. Radiology: ordered.    Details: No SBO by me  Discussion of management or test interpretation with external provider(s): 333 am: case d/w Dr. Signe of surgery.  Follow up as an outpatient.  Nothing additional to do for enterocolonic fistula at this time   Risk Prescription drug management. Risk Details: Patient is well appearing.  Will continue Augmentin  and add voltaren and start zofran  for nausea.  Have explained CT findings in detail and need for close follow up with surgery as an outpatient.  Stable for discharge.  Strict returns.  Follow up with surgery as an outpatient      Final diagnoses:  Diverticulitis  Enterocolic fistula  No signs of systemic illness or infection. The patient is nontoxic-appearing on exam and vital signs are within normal limits.  I have reviewed the triage vital signs and the nursing notes. Pertinent labs & imaging results that were available during my care of the patient were reviewed by me and considered in my medical decision making (see chart for details). After history, exam, and medical workup I feel the patient has been appropriately medically screened and is safe for discharge home. Pertinent diagnoses were discussed with the patient. Patient was given return precautions.           Millissa Deese, MD 01/13/24 [phone]

## 2024-01-13 NOTE — ED Triage Notes (Addendum)
 Recent discharge for diverticulitis and partial SBO. Is experiencing pain and nausea starting today. Has been taking antibiotics prescribed after discharge. Took tylenol  with no relief

## 2024-04-16 ENCOUNTER — Inpatient Hospital Stay (HOSPITAL_COMMUNITY)
Admission: EM | Admit: 2024-04-16 | Discharge: 2024-04-18 | DRG: 392 | Discharge status: Against Medical Advice | Payer: Self-pay | Attending: Internal Medicine | Admitting: Internal Medicine

## 2024-04-16 ENCOUNTER — Emergency Department (HOSPITAL_COMMUNITY): Payer: Self-pay

## 2024-04-16 ENCOUNTER — Other Ambulatory Visit: Payer: Self-pay

## 2024-04-16 ENCOUNTER — Encounter (HOSPITAL_COMMUNITY): Payer: Self-pay | Admitting: *Deleted

## 2024-04-16 DIAGNOSIS — Z833 Family history of diabetes mellitus: Secondary | ICD-10-CM

## 2024-04-16 DIAGNOSIS — E669 Obesity, unspecified: Secondary | ICD-10-CM | POA: Diagnosis present

## 2024-04-16 DIAGNOSIS — Z803 Family history of malignant neoplasm of breast: Secondary | ICD-10-CM

## 2024-04-16 DIAGNOSIS — Z5329 Procedure and treatment not carried out because of patient's decision for other reasons: Secondary | ICD-10-CM | POA: Diagnosis present

## 2024-04-16 DIAGNOSIS — Z9884 Bariatric surgery status: Secondary | ICD-10-CM

## 2024-04-16 DIAGNOSIS — J45909 Unspecified asthma, uncomplicated: Secondary | ICD-10-CM | POA: Diagnosis present

## 2024-04-16 DIAGNOSIS — K9186 Retained cholelithiasis following cholecystectomy: Secondary | ICD-10-CM | POA: Diagnosis present

## 2024-04-16 DIAGNOSIS — Z8041 Family history of malignant neoplasm of ovary: Secondary | ICD-10-CM

## 2024-04-16 DIAGNOSIS — Z8616 Personal history of COVID-19: Secondary | ICD-10-CM

## 2024-04-16 DIAGNOSIS — Z888 Allergy status to other drugs, medicaments and biological substances status: Secondary | ICD-10-CM

## 2024-04-16 DIAGNOSIS — Z8249 Family history of ischemic heart disease and other diseases of the circulatory system: Secondary | ICD-10-CM

## 2024-04-16 DIAGNOSIS — K5900 Constipation, unspecified: Secondary | ICD-10-CM | POA: Diagnosis present

## 2024-04-16 DIAGNOSIS — Z800 Family history of malignant neoplasm of digestive organs: Secondary | ICD-10-CM

## 2024-04-16 DIAGNOSIS — Z825 Family history of asthma and other chronic lower respiratory diseases: Secondary | ICD-10-CM

## 2024-04-16 DIAGNOSIS — K5792 Diverticulitis of intestine, part unspecified, without perforation or abscess without bleeding: Secondary | ICD-10-CM | POA: Diagnosis present

## 2024-04-16 DIAGNOSIS — E785 Hyperlipidemia, unspecified: Secondary | ICD-10-CM | POA: Diagnosis present

## 2024-04-16 DIAGNOSIS — K219 Gastro-esophageal reflux disease without esophagitis: Secondary | ICD-10-CM | POA: Diagnosis present

## 2024-04-16 DIAGNOSIS — K429 Umbilical hernia without obstruction or gangrene: Secondary | ICD-10-CM | POA: Diagnosis present

## 2024-04-16 DIAGNOSIS — R109 Unspecified abdominal pain: Secondary | ICD-10-CM

## 2024-04-16 DIAGNOSIS — Z6833 Body mass index (BMI) 33.0-33.9, adult: Secondary | ICD-10-CM

## 2024-04-16 DIAGNOSIS — K5720 Diverticulitis of large intestine with perforation and abscess without bleeding: Principal | ICD-10-CM | POA: Diagnosis present

## 2024-04-16 DIAGNOSIS — Y838 Other surgical procedures as the cause of abnormal reaction of the patient, or of later complication, without mention of misadventure at the time of the procedure: Secondary | ICD-10-CM | POA: Diagnosis present

## 2024-04-16 DIAGNOSIS — K5732 Diverticulitis of large intestine without perforation or abscess without bleeding: Principal | ICD-10-CM | POA: Diagnosis present

## 2024-04-16 DIAGNOSIS — Z9049 Acquired absence of other specified parts of digestive tract: Secondary | ICD-10-CM

## 2024-04-16 DIAGNOSIS — G8929 Other chronic pain: Secondary | ICD-10-CM | POA: Diagnosis present

## 2024-04-16 DIAGNOSIS — Z87442 Personal history of urinary calculi: Secondary | ICD-10-CM

## 2024-04-16 DIAGNOSIS — F419 Anxiety disorder, unspecified: Secondary | ICD-10-CM | POA: Diagnosis present

## 2024-04-16 DIAGNOSIS — Z79899 Other long term (current) drug therapy: Secondary | ICD-10-CM

## 2024-04-16 LAB — COMPREHENSIVE METABOLIC PANEL WITH GFR
ALT: 15 U/L (ref 0–44)
AST: 15 U/L (ref 15–41)
Albumin: 4 g/dL (ref 3.5–5.0)
Alkaline Phosphatase: 83 U/L (ref 38–126)
Anion gap: 11 (ref 5–15)
BUN: 7 mg/dL (ref 6–20)
CO2: 24 mmol/L (ref 22–32)
Calcium: 9.4 mg/dL (ref 8.9–10.3)
Chloride: 100 mmol/L (ref 98–111)
Creatinine, Ser: 0.73 mg/dL (ref 0.44–1.00)
GFR, Estimated: >60 mL/min (ref >60)
Glucose, Bld: 100 mg/dL — ABNORMAL HIGH (ref 70–99)
Potassium: 3.7 mmol/L (ref 3.5–5.1)
Sodium: 135 mmol/L (ref 135–145)
Total Bilirubin: 1.4 mg/dL — ABNORMAL HIGH (ref 0.0–1.2)
Total Protein: 7.7 g/dL (ref 6.5–8.1)

## 2024-04-16 LAB — HCG, SERUM, QUALITATIVE: Preg, Serum: NEGATIVE

## 2024-04-16 LAB — URINALYSIS, ROUTINE W REFLEX MICROSCOPIC
Bilirubin Urine: NEGATIVE
Glucose, UA: NEGATIVE mg/dL
Hgb urine dipstick: NEGATIVE
Ketones, ur: 20 mg/dL — AB
Leukocytes,Ua: NEGATIVE
Nitrite: NEGATIVE
Protein, ur: NEGATIVE mg/dL
Specific Gravity, Urine: 1.023 (ref 1.005–1.030)
pH: 5 (ref 5.0–8.0)

## 2024-04-16 LAB — CBC
HCT: 42.5 % (ref 36.0–46.0)
Hemoglobin: 13.9 g/dL (ref 12.0–15.0)
MCH: 28.4 pg (ref 26.0–34.0)
MCHC: 32.7 g/dL (ref 30.0–36.0)
MCV: 86.7 fL (ref 80.0–100.0)
Platelets: 262 K/uL (ref 150–400)
RBC: 4.9 MIL/uL (ref 3.87–5.11)
RDW: 12.2 % (ref 11.5–15.5)
WBC: 8.8 K/uL (ref 4.0–10.5)
nRBC: 0 % (ref 0.0–0.2)

## 2024-04-16 LAB — LIPASE, BLOOD: Lipase: 21 U/L (ref 11–51)

## 2024-04-16 MED ORDER — HYDROMORPHONE HCL 1 MG/ML IJ SOLN
1.0000 mg | Freq: Once | INTRAMUSCULAR | Status: AC
  Administered 2024-04-16: 1 mg via INTRAVENOUS
  Filled 2024-04-16: qty 1

## 2024-04-16 MED ORDER — PIPERACILLIN-TAZOBACTAM 3.375 G IVPB 30 MIN
3.3750 g | Freq: Once | INTRAVENOUS | Status: AC
  Administered 2024-04-16: 3.375 g via INTRAVENOUS
  Filled 2024-04-16: qty 50

## 2024-04-16 MED ORDER — IOHEXOL 300 MG/ML  SOLN
100.0000 mL | Freq: Once | INTRAMUSCULAR | Status: AC | PRN
  Administered 2024-04-16: 100 mL via INTRAVENOUS

## 2024-04-16 NOTE — ED Provider Notes (Signed)
 [place] EMERGENCY DEPARTMENT AT [facility] Provider Note  CSN: [id number] Arrival date & time: 04/16/24  8048   Patient presents with: Abdominal Pain   Amy Mendoza is a 43 y.o. female with a past medical history of diverticulitis, obesity s/p gastric sleeve, partial SBO, cholelithiasis s/p cholecystectomy, nephrolithiasis, GERD, asthma, HLD, anxiety who presents for evaluation of acute onset abdominal pain.  The patient was previously admitted in June for diverticulitis and experienced upper and lower abdominal pain that was similar to her previous admission.  Patient states that she has had worsening abdominal pain that started 2 days ago.  Describes the pain is worse with walking and bending over.  She also endorses worsening of pain when she is driving and drives over speed bumps.  Associated symptoms include abdominal bloating which  has been present over the same time course.  She was previously taking MiraLAX  for constipation and would have bowel movements.  However, in the last week she has not had a bowel movement until today.  She states that she has not eaten in the past 2 days but today she ate some biscuits and chips and had loose stools for 1 episode.  She has a history of cholecystectomy from cholelithiasis.  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  Denies fevers, chills, fatigue, myalgias, chest pain, cough, shortness of breath, nausea/vomiting.  Denies recent NSAID use.  Patient endorsing pain in the left gluteal region.  Explains that she has sciatica which she has being followed in the outpatient setting for.  She has tried gabapentin has not really helped her pain that much.  This pain has been ongoing for 3 weeks.   Prior to Admission medications   Medication Sig Start Date End Date Taking? Authorizing Provider  oxyCODONE -acetaminophen  (PERCOCET) 10-325 MG tablet Take 0.5-1 tablets by mouth  every 8 (eight) hours as needed. 03/20/24  Yes [provider]  amoxicillin -clavulanate (AUGMENTIN ) 875-125 MG tablet Take 1 tablet by mouth every 12 (twelve) hours. 01/13/24   Palumbo, April, MD  cyclobenzaprine  (FLEXERIL ) 10 MG tablet Take 10 mg by mouth 3 (three) times daily as needed for muscle spasms.    [provider]  Diclofenac  Sodium CR 100 MG 24 hr tablet Take 1 tablet (100 mg total) by mouth daily. 01/13/24   Palumbo, April, MD  dicyclomine  (BENTYL ) 10 MG capsule Take 10 mg by mouth 3 (three) times daily as needed for spasms. 06/07/22   [provider]  gabapentin (NEURONTIN) 100 MG capsule Take 100 mg by mouth 3 (three) times daily as needed (for pain).    [provider]  ibuprofen  (IBU-200) 200 MG tablet Take 200-400 mg by mouth every 6 (six) hours as needed for moderate pain (pain score 4-6).    [provider]  ketoconazole  (NIZORAL ) 2 % shampoo Apply 1 Application topically 2 (two) times a week. 11/19/23   Vicci Barnie NOVAK, MD  omeprazole  (PRILOSEC) 20 MG capsule Take 20 mg by mouth every morning. Patient not taking: Reported on 12/31/2023    [provider]  ondansetron  (ZOFRAN -ODT) 4 MG disintegrating tablet Take 1 tablet (4 mg total) by mouth every 8 (eight) hours as needed for nausea or vomiting. 01/13/24   Palumbo, April, MD  ondansetron  (ZOFRAN -ODT) 8 MG disintegrating tablet 8mg  ODT q4 hours prn nausea 01/13/24   Palumbo, April, MD  phentermine 37.5 MG capsule Take 37.5 mg by mouth every morning. 12/12/23  [provider]  VITAMIN D PO Take 1 tablet by mouth daily.    [provider]  ZYRTEC ALLERGY 10 MG tablet Take 10 mg by mouth daily.    [provider]    Allergies: Metformin and related and Sertraline     Review of Systems  Constitutional:  Negative for appetite change, chills, fatigue and fever.  HENT:  Negative for rhinorrhea, sinus pain, sneezing, sore throat and trouble swallowing.    Respiratory:  Negative for cough, shortness of breath and wheezing.   Cardiovascular:  Negative for chest pain, palpitations and leg swelling.  Gastrointestinal:  Positive for abdominal distention, abdominal pain, constipation and diarrhea. Negative for blood in stool and nausea.  Genitourinary:  Negative for difficulty urinating, dysuria and vaginal bleeding.  Musculoskeletal:  Positive for back pain. Negative for myalgias.  Neurological:  Negative for dizziness, light-headedness and headaches.    Updated Vital Signs BP 117/63   Pulse 98   Temp 98.9 F (37.2 C) (Oral)   Resp 11   Ht 5' 9 (1.753 m)   Wt 102.1 kg   SpO2 100%   BMI 33.23 kg/m   Physical Exam Vitals reviewed.  Constitutional:      General: She is not in acute distress.    Appearance: Normal appearance. She is normal weight. She is not ill-appearing, toxic-appearing or diaphoretic.  HENT:     Head: Normocephalic and atraumatic.  Eyes:     General: No scleral icterus.       Right eye: No discharge.        Left eye: No discharge.     Extraocular Movements: Extraocular movements intact.     Conjunctiva/sclera: Conjunctivae normal.     Pupils: Pupils are equal, round, and reactive to light.  Cardiovascular:     Rate and Rhythm: Normal rate and regular rhythm.     Pulses: Normal pulses.     Heart sounds: Normal heart sounds. No murmur heard.    No friction rub. No gallop.  Pulmonary:     Effort: Pulmonary effort is normal. No respiratory distress.     Breath sounds: Normal breath sounds. No stridor. No wheezing, rhonchi or rales.  Abdominal:     General: Abdomen is flat. There is no distension.     Palpations: Abdomen is soft. There is no mass.     Tenderness: There is generalized abdominal tenderness and tenderness in the periumbilical area and left lower quadrant. There is no right CVA tenderness, left CVA tenderness, guarding or rebound.  Musculoskeletal:        General: No swelling, tenderness, deformity  or signs of injury.     Right lower leg: No edema.     Left lower leg: No edema.  Skin:    General: Skin is warm and dry.     Capillary Refill: Capillary refill takes less than 2 seconds.     Coloration: Skin is not jaundiced or pale.     Findings: No bruising, erythema, lesion or rash.  Neurological:     General: No focal deficit present.     Mental Status: She is alert and oriented to person, place, and time. Mental status is at baseline.     Motor: No weakness.     Gait: Gait normal.  Psychiatric:        Mood and Affect: Mood normal.        Behavior: Behavior normal.     (all labs ordered are listed, but only abnormal results are displayed)  Labs Reviewed  COMPREHENSIVE METABOLIC PANEL WITH GFR - Abnormal; Notable for the following components:      Result Value   Glucose, Bld 100 (*)    Total Bilirubin 1.4 (*)    All other components within normal limits  URINALYSIS, ROUTINE W REFLEX MICROSCOPIC - Abnormal; Notable for the following components:   Ketones, ur 20 (*)    All other components within normal limits  LIPASE, BLOOD  CBC  HCG, SERUM, QUALITATIVE    EKG: None  Radiology: CT ABDOMEN PELVIS W CONTRAST Result Date: 04/16/2024 CLINICAL DATA:  Left-sided abdominal pain, diarrhea EXAM: CT ABDOMEN AND PELVIS WITH CONTRAST TECHNIQUE: Multidetector CT imaging of the abdomen and pelvis was performed using the standard protocol following bolus administration of intravenous contrast. RADIATION DOSE REDUCTION: This exam was performed according to the departmental dose-optimization program which includes automated exposure control, adjustment of the mA and/or kV according to patient size and/or use of iterative reconstruction technique. CONTRAST:  OMNIPAQUE  IOHEXOL  300 MG/ML  SOLN COMPARISON:  01/13/2024 FINDINGS: Lower chest: No acute pleural or parenchymal lung disease. Hepatobiliary: No focal liver abnormality is seen. Status post cholecystectomy. No biliary dilatation.  Pancreas: Unremarkable. No pancreatic ductal dilatation or surrounding inflammatory changes. Spleen: Normal in size without focal abnormality. Adrenals/Urinary Tract: Adrenal glands are unremarkable. Kidneys are normal, without renal calculi, focal lesion, or hydronephrosis. Bladder is unremarkable. Stomach/Bowel: No bowel obstruction or ileus. Normal appendix right lower quadrant. Diverticulosis of the descending and sigmoid colon, with long segment sigmoid colonic wall thickening and pericolonic fat stranding consistent with acute diverticulitis. There is a punctate focus of extraluminal gas within the central pelvis image 64/2, with extensive fat stranding extending between the inflamed sigmoid colon and an adjacent loop of jejunum. Enterocolic fistula cannot be excluded. No fluid collection or abscess. Vascular/Lymphatic: No significant vascular findings are present. No enlarged abdominal or pelvic lymph nodes. Reproductive: Uterus and bilateral adnexa are unremarkable. Other: No free fluid or free intraperitoneal gas. Fat containing umbilical hernia. No bowel herniation. Musculoskeletal: No acute or destructive bony abnormalities. Reconstructed images demonstrate no additional findings. IMPRESSION: 1. Acute sigmoid diverticulitis. Punctate focus of extraluminal gas is seen within a region of mesenteric inflammation between the inflamed sigmoid colon and a loop of distal jejunum, which may reflect micro perforation or underlying enterocolic fistula. No fluid collection or abscess. 2. Stable small fat containing umbilical hernia. Electronically Signed   By: Ozell Daring M.D.   On: 04/16/2024 22:52    Medications Ordered in the ED  HYDROmorphone  (DILAUDID ) injection 1 mg (has no administration in time range)  piperacillin -tazobactam (ZOSYN ) IVPB 3.375 g (has no administration in time range)  HYDROmorphone  (DILAUDID ) injection 1 mg (1 mg Intravenous Given 04/16/24 2215)  iohexol  (OMNIPAQUE ) 300 MG/ML  solution 100 mL (100 mLs Intravenous Contrast Given 04/16/24 2232)    Medical Decision Making Patient with history of diverticulitis and partial SBO now presenting with acute onset abdominal pain that subjectively feels similar to prior pain she experienced with her past admission.  Vitals showing tachycardia but otherwise within normal limits.  Exam showing severe tenderness to palpation in epigastric area and left lower quadrant.  No rebound tenderness. Patient's complaint of abdominal pain exacerbated by walking/movement/speed bumps sounds like peritonitis however patient does not have rebound tenderness on exam.  CBC and CMP relatively unremarkable.  Lipase within normal limits.  UA negative.  Serum hCG negative.  Differential for patient's abdominal pain includes recurrent diverticulitis with possible complication of abscess formation, fistula, perforation.  Other differential includes small bowel obstruction, gastritis, gastroenteritis, peritonitis.  Low concern for ACS given lack of chest pain or nausea/vomiting.  Further workup to include CT abdomen pelvis with contrast.  1101 CT abdomen/pelvis showing acute sigmoid diverticulitis with questionable microperforation vs fistula formation. Reassessed patient and she is still having 7/10 abdominal pain with unchanged exam. Will start Zosyn  given concern for microperforation and placed consult to hospitalist for admission.  1110 Discussed patient case with Dr. Terry Hurst with [facility] who agrees to come evaluate the patient for admission.   Final diagnoses:  Sigmoid diverticulitis  Abdominal pain, unspecified abdominal location    ED Discharge Orders     None        Waymond Cart, MD 04/16/24 2314    Tegeler, Lonni PARAS, MD 04/16/24 2322

## 2024-04-16 NOTE — Progress Notes (Signed)
 ED Pharmacy Antibiotic Sign Off An antibiotic consult was received from an ED provider for zosyn  per pharmacy dosing for [facility]. A chart review was completed to assess appropriateness.   The following one time order(s) were placed:  Zosyn  3.375 gm  Further antibiotic and/or antibiotic pharmacy consults should be ordered by the admitting provider if indicated.   Thank you for allowing pharmacy to be a part of this patient's care.   Rosaline IVAR Edison, Pharm.D Use secure chat for questions 04/16/2024 11:09 PM Clinical Pharmacist 04/16/24 11:09 PM

## 2024-04-16 NOTE — ED Triage Notes (Signed)
 Pt reports upper and lower abdominal pain since Monday night. Diarrhea today. Denies fevers.

## 2024-04-17 DIAGNOSIS — K5732 Diverticulitis of large intestine without perforation or abscess without bleeding: Secondary | ICD-10-CM

## 2024-04-17 LAB — LACTIC ACID, PLASMA
Lactic Acid, Venous: 1.4 mmol/L (ref 0.5–1.9)
Lactic Acid, Venous: 1 mmol/L (ref 0.5–1.9)

## 2024-04-17 LAB — CBC
HCT: 42.2 % (ref 36.0–46.0)
HCT: 41.3 % (ref 36.0–46.0)
Hemoglobin: 13 g/dL (ref 12.0–15.0)
Hemoglobin: 12.7 g/dL (ref 12.0–15.0)
MCH: 27.5 pg (ref 26.0–34.0)
MCH: 27 pg (ref 26.0–34.0)
MCHC: 30.8 g/dL (ref 30.0–36.0)
MCHC: 30.8 g/dL (ref 30.0–36.0)
MCV: 89.4 fL (ref 80.0–100.0)
MCV: 87.9 fL (ref 80.0–100.0)
Platelets: 219 K/uL (ref 150–400)
Platelets: 238 K/uL (ref 150–400)
RBC: 4.72 MIL/uL (ref 3.87–5.11)
RBC: 4.7 MIL/uL (ref 3.87–5.11)
RDW: 12.1 % (ref 11.5–15.5)
RDW: 12.1 % (ref 11.5–15.5)
WBC: 7 K/uL (ref 4.0–10.5)
WBC: 6.3 K/uL (ref 4.0–10.5)
nRBC: 0 % (ref 0.0–0.2)
nRBC: 0 % (ref 0.0–0.2)

## 2024-04-17 LAB — COMPREHENSIVE METABOLIC PANEL WITH GFR
ALT: 12 U/L (ref 0–44)
AST: 16 U/L (ref 15–41)
Albumin: 3.7 g/dL (ref 3.5–5.0)
Alkaline Phosphatase: 73 U/L (ref 38–126)
Anion gap: 11 (ref 5–15)
BUN: 7 mg/dL (ref 6–20)
CO2: 21 mmol/L — ABNORMAL LOW (ref 22–32)
Calcium: 8.9 mg/dL (ref 8.9–10.3)
Chloride: 103 mmol/L (ref 98–111)
Creatinine, Ser: 0.74 mg/dL (ref 0.44–1.00)
GFR, Estimated: >60 mL/min (ref >60)
Glucose, Bld: 79 mg/dL (ref 70–99)
Potassium: 3.9 mmol/L (ref 3.5–5.1)
Sodium: 136 mmol/L (ref 135–145)
Total Bilirubin: 1.1 mg/dL (ref 0.0–1.2)
Total Protein: 7.3 g/dL (ref 6.5–8.1)

## 2024-04-17 LAB — LIPASE, BLOOD: Lipase: 24 U/L (ref 11–51)

## 2024-04-17 LAB — MAGNESIUM: Magnesium: 2.2 mg/dL (ref 1.7–2.4)

## 2024-04-17 LAB — PHOSPHORUS: Phosphorus: 3.5 mg/dL (ref 2.5–4.6)

## 2024-04-17 MED ORDER — ONDANSETRON 4 MG PO TBDP
4.0000 mg | ORAL_TABLET | Freq: Once | ORAL | Status: AC
  Administered 2024-04-17: 4 mg via ORAL
  Filled 2024-04-17: qty 1

## 2024-04-17 MED ORDER — MORPHINE SULFATE (PF) 2 MG/ML IV SOLN: 2.0000 mg | INTRAVENOUS | Status: DC | PRN

## 2024-04-17 MED ORDER — OXYCODONE HCL 5 MG PO TABS
5.0000 mg | ORAL_TABLET | Freq: Four times a day (QID) | ORAL | Status: DC | PRN
  Administered 2024-04-17 – 2024-04-18 (×3): 5 mg via ORAL
  Filled 2024-04-17 (×3): qty 1

## 2024-04-17 MED ORDER — SODIUM CHLORIDE 0.9 % IV SOLN
INTRAVENOUS | Status: DC
Start: 2024-04-17 — End: 2024-04-18

## 2024-04-17 MED ORDER — PIPERACILLIN-TAZOBACTAM 3.375 G IVPB 30 MIN: 3.3750 g | Freq: Three times a day (TID) | INTRAVENOUS | Status: DC

## 2024-04-17 MED ORDER — HYDROMORPHONE HCL 1 MG/ML IJ SOLN
1.0000 mg | INTRAMUSCULAR | Status: AC | PRN
  Administered 2024-04-17 (×2): 1 mg via INTRAVENOUS
  Filled 2024-04-17 (×2): qty 1

## 2024-04-17 MED ORDER — ACETAMINOPHEN 325 MG PO TABS: 650.0000 mg | ORAL_TABLET | Freq: Four times a day (QID) | ORAL | Status: DC | PRN

## 2024-04-17 MED ORDER — POLYETHYLENE GLYCOL 3350 17 G PO PACK: 17.0000 g | PACK | Freq: Every day | ORAL | Status: DC | PRN

## 2024-04-17 MED ORDER — ENOXAPARIN SODIUM 40 MG/0.4ML IJ SOSY
40.0000 mg | PREFILLED_SYRINGE | Freq: Every day | INTRAMUSCULAR | Status: DC
  Administered 2024-04-17 (×2): 40 mg via SUBCUTANEOUS
  Filled 2024-04-17 (×3): qty 0.4

## 2024-04-17 MED ORDER — PROCHLORPERAZINE EDISYLATE 10 MG/2ML IJ SOLN: 5.0000 mg | Freq: Four times a day (QID) | INTRAMUSCULAR | Status: DC | PRN

## 2024-04-17 MED ORDER — SODIUM CHLORIDE 0.9 % IV SOLN
INTRAVENOUS | Status: DC
Start: 2024-04-17 — End: 2024-04-17

## 2024-04-17 MED ORDER — HYDROMORPHONE HCL 1 MG/ML IJ SOLN
0.5000 mg | INTRAMUSCULAR | Status: DC | PRN
  Administered 2024-04-17 (×2): 0.5 mg via INTRAVENOUS
  Filled 2024-04-17 (×2): qty 0.5

## 2024-04-17 MED ORDER — MELATONIN 5 MG PO TABS
5.0000 mg | ORAL_TABLET | Freq: Every evening | ORAL | Status: DC | PRN
Start: 2024-04-17 — End: 2024-04-18

## 2024-04-17 MED ORDER — PIPERACILLIN-TAZOBACTAM 3.375 G IVPB
3.3750 g | Freq: Three times a day (TID) | INTRAVENOUS | Status: DC
  Administered 2024-04-17 – 2024-04-18 (×5): 3.375 g via INTRAVENOUS
  Filled 2024-04-17 (×5): qty 50

## 2024-04-17 NOTE — H&P (Signed)
 History and Physical    Amy Mendoza FMW:[id number] DOB: 11-03-1980 DOA: 04/16/2024  PCP: Patient, No Pcp Per  Patient coming from: Home  I have personally briefly reviewed patient's old medical records in [facility]  Chief Complaint: Abdominal pain since 4 days  HPI: Amy Mendoza is a 43 y.o. female with medical history significant of diverticulitis, obesity status post gastric sleeve procedure, partial SBO, cholelithiasis s/p cholecystectomy, nephrolithiasis, GERD, asthma, hyperlipidemia, anxiety, chronic joint pain presented with complaining of epigastric and left lower quadrant pain that started on Monday evening.  Abdominal pain was severe in intensity, nonradiating.  Reports that she has been constipated for the past 1 week for which she has been taking MiraLAX  and she had loose stool yesterday.  She denies nausea, vomiting, melena however reports poor appetite for the past couple of days.  No history of tobacco abuse, alcohol use, licit drug use  ED Course: Upon arrival to ED: Patient afebrile, pulse 108, BP 116/74, maintaining oxygen saturation on room air, RR: 18.  Normal CBC.  NA: 136, K: 3.9, BUN 7, CR: 0.74, normal liver enzymes, bicarb 21.  Normal magnesium and phosphorous level.  CT abdomen/pelvis shows acute sigmoid diverticulitis.  Punctate focus of extraluminal gas is seen within the region of mesenteric inflammation between then from sigmoid colon and loop of distal jejunum which may reflect microperforation or underlying enterocolic fistula.  No fluid collection or abscess.  Stable small fat-containing umbilical hernia.  Patient was given IV fluids, Dilaudid  and started on Zosyn  in the ER.  [facility] hospitalist consulted for admission.  Review of Systems: As per HPI otherwise negative.    Past Medical History:  Diagnosis Date   Anxiety    Asthma    Chronic headaches    COVID-19    Gallstones    Gastritis    GERD (gastroesophageal reflux disease)     Hyperlipemia    no meds   Kidney stones     Past Surgical History:  Procedure Laterality Date   BARIATRIC SURGERY  05/17/2022   CHOLECYSTECTOMY  08/2004   COLONOSCOPY     KNEE SURGERY Left      reports that she has never smoked. She has never used smokeless tobacco. She reports that she does not currently use alcohol. She reports that she does not currently use drugs.  Allergies  Allergen Reactions   Metformin And Related Diarrhea   Sertraline  Anxiety and Other (See Comments)    Pt states it made her crazy/worsened anxiety    Family History  Problem Relation Age of Onset   Diabetes Father    Heart failure Father    Heart attack Father    Prostate cancer Maternal Grandfather    Pancreatic cancer Maternal Grandfather    Colon cancer Maternal Grandfather    Breast cancer Maternal Aunt    Ovarian cancer Mother    Diabetes Maternal Grandmother    Kidney Stones Maternal Grandmother    Heart attack Paternal Grandfather    Heart attack Paternal Aunt    Diabetes Maternal Uncle    Asthma Son    Tics Neg Hx    Migraines Neg Hx    Esophageal cancer Neg Hx    Rectal cancer Neg Hx    Stomach cancer Neg Hx     Prior to Admission medications   Medication Sig Start Date End Date Taking? Authorizing Provider  oxyCODONE -acetaminophen  (PERCOCET) 10-325 MG tablet Take 0.5-1 tablets by mouth every 8 (eight) hours as needed. 03/20/24  Yes [provider]  amoxicillin -clavulanate (AUGMENTIN ) 875-125 MG tablet Take 1 tablet by mouth every 12 (twelve) hours. 01/13/24   Palumbo, April, MD  cyclobenzaprine  (FLEXERIL ) 10 MG tablet Take 10 mg by mouth 3 (three) times daily as needed for muscle spasms.    [provider]  Diclofenac  Sodium CR 100 MG 24 hr tablet Take 1 tablet (100 mg total) by mouth daily. 01/13/24   Palumbo, April, MD  dicyclomine  (BENTYL ) 10 MG capsule Take 10 mg by mouth 3 (three) times daily as needed for spasms. 06/07/22   [provider]   gabapentin (NEURONTIN) 100 MG capsule Take 100 mg by mouth 3 (three) times daily as needed (for pain).    [provider]  ibuprofen  (IBU-200) 200 MG tablet Take 200-400 mg by mouth every 6 (six) hours as needed for moderate pain (pain score 4-6).    [provider]  ketoconazole  (NIZORAL ) 2 % shampoo Apply 1 Application topically 2 (two) times a week. 11/19/23   Vicci Barnie NOVAK, MD  omeprazole  (PRILOSEC) 20 MG capsule Take 20 mg by mouth every morning. Patient not taking: Reported on 12/31/2023    [provider]  ondansetron  (ZOFRAN -ODT) 4 MG disintegrating tablet Take 1 tablet (4 mg total) by mouth every 8 (eight) hours as needed for nausea or vomiting. 01/13/24   Palumbo, April, MD  ondansetron  (ZOFRAN -ODT) 8 MG disintegrating tablet 8mg  ODT q4 hours prn nausea 01/13/24   Palumbo, April, MD  phentermine 37.5 MG capsule Take 37.5 mg by mouth every morning. 12/12/23   [provider]  VITAMIN D PO Take 1 tablet by mouth daily.    [provider]  ZYRTEC ALLERGY 10 MG tablet Take 10 mg by mouth daily.    [provider]    Physical Exam: Vitals:   04/17/24 0004 04/17/24 0004 04/17/24 0031 04/17/24 0604  BP:   109/66 96/65  Pulse: 93  93 84  Resp: 17  17 17   Temp:  98.1 F (36.7 C) 97.6 F (36.4 C) 98 F (36.7 C)  TempSrc:  Oral Oral Oral  SpO2: 100%  100% 100%  Weight:      Height:        Constitutional: NAD, calm, comfortable, on room air, communicating well Eyes: PERRL, lids and conjunctivae normal ENMT: Mucous membranes are moist. Posterior pharynx clear of any exudate or lesions.Normal dentition.  Neck: normal, supple, no masses, no thyromegaly Respiratory: clear to auscultation bilaterally, no wheezing, no crackles. Normal respiratory effort. No accessory muscle use.  Cardiovascular: Regular rate and rhythm, no murmurs / rubs / gallops. No extremity edema. 2+ pedal pulses. No carotid bruits.  Abdomen: [place] soft, epigastric  and left lower quadrant tenderness positive, no guarding, no rigidity, no hepatosplenomegaly.  Bowel sounds positive Musculoskeletal: no clubbing / cyanosis. No joint deformity upper and lower extremities. Good ROM, no contractures. Normal muscle tone.  Skin: no rashes, lesions, ulcers. No induration Neurologic: CN 2-12 grossly intact. Sensation intact, DTR normal. Strength 5/5 in all 4.  Psychiatric: Normal judgment and insight. Alert and oriented x 3. Normal mood.    Labs on Admission: I have personally reviewed following labs and imaging studies  CBC: Recent Labs  Lab 04/16/24 2013 04/17/24 0446  WBC 8.8 7.0  HGB 13.9 13.0  HCT 42.5 42.2  MCV 86.7 89.4  PLT 262 219   Basic Metabolic Panel: Recent Labs  Lab 04/16/24 2013 04/17/24 0446  NA 135 136  K 3.7 3.9  CL 100 103  CO2 24 21*  GLUCOSE 100* 79  BUN 7 7  CREATININE 0.73 0.74  CALCIUM 9.4 8.9  MG  --  2.2  PHOS  --  3.5   GFR: Estimated Creatinine Clearance: 117.8 mL/min (by C-G formula based on SCr of 0.74 mg/dL). Liver Function Tests: Recent Labs  Lab 04/16/24 2013 04/17/24 0446  AST 15 16  ALT 15 12  ALKPHOS 83 73  BILITOT 1.4* 1.1  PROT 7.7 7.3  ALBUMIN 4.0 3.7   Recent Labs  Lab 04/16/24 2013  LIPASE 21   No results for input(s): AMMONIA in the last 168 hours. Coagulation Profile: No results for input(s): INR, PROTIME in the last 168 hours. Cardiac Enzymes: No results for input(s): CKTOTAL, CKMB, CKMBINDEX, TROPONINI in the last 168 hours. BNP (last 3 results) No results for input(s): PROBNP in the last 8760 hours. HbA1C: No results for input(s): HGBA1C in the last 72 hours. CBG: No results for input(s): GLUCAP in the last 168 hours. Lipid Profile: No results for input(s): CHOL, HDL, LDLCALC, TRIG, CHOLHDL, LDLDIRECT in the last 72 hours. Thyroid  Function Tests: No results for input(s): TSH, T4TOTAL, FREET4, T3FREE, THYROIDAB in the last 72  hours. Anemia Panel: No results for input(s): VITAMINB12, FOLATE, FERRITIN, TIBC, IRON, RETICCTPCT in the last 72 hours. Urine analysis:    Component Value Date/Time   COLORURINE YELLOW 04/16/2024 2013   APPEARANCEUR CLEAR 04/16/2024 2013   LABSPEC 1.023 04/16/2024 2013   PHURINE 5.0 04/16/2024 2013   GLUCOSEU NEGATIVE 04/16/2024 2013   HGBUR NEGATIVE 04/16/2024 2013   BILIRUBINUR NEGATIVE 04/16/2024 2013   KETONESUR 20 (A) 04/16/2024 2013   PROTEINUR NEGATIVE 04/16/2024 2013   UROBILINOGEN 0.2 04/15/2019 1303   NITRITE NEGATIVE 04/16/2024 2013   LEUKOCYTESUR NEGATIVE 04/16/2024 2013    Radiological Exams on Admission: CT ABDOMEN PELVIS W CONTRAST Result Date: 04/16/2024 CLINICAL DATA:  Left-sided abdominal pain, diarrhea EXAM: CT ABDOMEN AND PELVIS WITH CONTRAST TECHNIQUE: Multidetector CT imaging of the abdomen and pelvis was performed using the standard protocol following bolus administration of intravenous contrast. RADIATION DOSE REDUCTION: This exam was performed according to the departmental dose-optimization program which includes automated exposure control, adjustment of the mA and/or kV according to patient size and/or use of iterative reconstruction technique. CONTRAST:  OMNIPAQUE  IOHEXOL  300 MG/ML  SOLN COMPARISON:  01/13/2024 FINDINGS: Lower chest: No acute pleural or parenchymal lung disease. Hepatobiliary: No focal liver abnormality is seen. Status post cholecystectomy. No biliary dilatation. Pancreas: Unremarkable. No pancreatic ductal dilatation or surrounding inflammatory changes. Spleen: Normal in size without focal abnormality. Adrenals/Urinary Tract: Adrenal glands are unremarkable. Kidneys are normal, without renal calculi, focal lesion, or hydronephrosis. Bladder is unremarkable. Stomach/Bowel: No bowel obstruction or ileus. Normal appendix right lower quadrant. Diverticulosis of the descending and sigmoid colon, with long segment sigmoid colonic wall  thickening and pericolonic fat stranding consistent with acute diverticulitis. There is a punctate focus of extraluminal gas within the central pelvis image 64/2, with extensive fat stranding extending between the inflamed sigmoid colon and an adjacent loop of jejunum. Enterocolic fistula cannot be excluded. No fluid collection or abscess. Vascular/Lymphatic: No significant vascular findings are present. No enlarged abdominal or pelvic lymph nodes. Reproductive: Uterus and bilateral adnexa are unremarkable. Other: No free fluid or free intraperitoneal gas. Fat containing umbilical hernia. No bowel herniation. Musculoskeletal: No acute or destructive bony abnormalities. Reconstructed images demonstrate no additional findings. IMPRESSION: 1. Acute sigmoid diverticulitis. Punctate focus of extraluminal gas is seen within a region of mesenteric inflammation between  the inflamed sigmoid colon and a loop of distal jejunum, which may reflect micro perforation or underlying enterocolic fistula. No fluid collection or abscess. 2. Stable small fat containing umbilical hernia. Electronically Signed   By: Ozell Daring M.D.   On: 04/16/2024 22:52    Assessment/Plan  Acute sigmoid diverticulitis with possible microperforation: - Patient presented with severe abdominal pain. - She is afebrile with no leukocytosis.  CT abdomen/pelvis reviewed concerning for acute sigmoid diverticulitis with?  Microperforation or underlying enterocolic fistula. - Received IV fluid and Zosyn  in ED. - Check lactic acid and lipase.  Will continue IV fluids and Zosyn  - Continue as needed pain medication and antiemetic as needed - Advance diet as tolerated  Obesity with BMI of 33: - Status post gastric sleeve surgery in the past  GERD: On PPI as needed at home  Status post left elbow surgery Chronic joint pain: - Takes gabapentin as needed  Cholelithiasis status postcholecystectomy - Aware.  Small fat-containing umbilical  hernia - Noted on CT.  DVT prophylaxis: Lovenox  Code Status: Full code Family Communication: None present at bedside.  Plan of care discussed with patient in length and she verbalized understanding and agreed with it. Disposition Plan:  home Consults called: None Admission status: Inpatient   Velna JONELLE Skeeter MD [facility]  If 7PM-7AM, please contact night-coverage [email or web address]  04/17/2024, 8:27 AM

## 2024-04-17 NOTE — Plan of Care (Signed)
   Problem: Education: Goal: Knowledge of General Education information will improve Description: Including pain rating scale, medication(s)/side effects and non-pharmacologic comfort measures Outcome: Progressing   Problem: Activity: Goal: Risk for activity intolerance will decrease Outcome: Progressing   Problem: Coping: Goal: Level of anxiety will decrease Outcome: Progressing

## 2024-04-17 NOTE — Plan of Care (Signed)
  Problem: Clinical Measurements: Goal: Will remain free from infection Outcome: Progressing   Problem: Activity: Goal: Risk for activity intolerance will decrease Outcome: Progressing   

## 2024-04-17 NOTE — Progress Notes (Signed)
   04/17/24 0836  TOC Brief Assessment  Insurance and Status Lapsed (No insurance on file)  Patient has primary care physician No (No PCP on file)  Home environment has been reviewed resides in an apartment  Prior level of function: Independent  Prior/Current Home Services No current home services  Social Drivers of Health Review SDOH reviewed no interventions necessary  Readmission risk has been reviewed Yes  Transition of care needs no transition of care needs at this time

## 2024-04-18 LAB — BASIC METABOLIC PANEL WITH GFR
Anion gap: 9 (ref 5–15)
BUN: 5 mg/dL — ABNORMAL LOW (ref 6–20)
CO2: 25 mmol/L (ref 22–32)
Calcium: 8.7 mg/dL — ABNORMAL LOW (ref 8.9–10.3)
Chloride: 101 mmol/L (ref 98–111)
Creatinine, Ser: 0.77 mg/dL (ref 0.44–1.00)
GFR, Estimated: >60 mL/min (ref >60)
Glucose, Bld: 77 mg/dL (ref 70–99)
Potassium: 4.3 mmol/L (ref 3.5–5.1)
Sodium: 135 mmol/L (ref 135–145)

## 2024-04-18 LAB — CBC
HCT: 38.2 % (ref 36.0–46.0)
Hemoglobin: 11.9 g/dL — ABNORMAL LOW (ref 12.0–15.0)
MCH: 27.5 pg (ref 26.0–34.0)
MCHC: 31.2 g/dL (ref 30.0–36.0)
MCV: 88.4 fL (ref 80.0–100.0)
Platelets: 228 K/uL (ref 150–400)
RBC: 4.32 MIL/uL (ref 3.87–5.11)
RDW: 11.9 % (ref 11.5–15.5)
WBC: 6.6 K/uL (ref 4.0–10.5)
nRBC: 0 % (ref 0.0–0.2)

## 2024-04-18 MED ORDER — METRONIDAZOLE 500 MG PO TABS: 500.0000 mg | ORAL_TABLET | Freq: Three times a day (TID) | ORAL | 0 refills | Status: AC

## 2024-04-18 MED ORDER — CIPROFLOXACIN HCL 500 MG PO TABS: 500.0000 mg | ORAL_TABLET | Freq: Two times a day (BID) | ORAL | 0 refills | Status: AC

## 2024-04-18 NOTE — Progress Notes (Signed)
 Doctor was contacted due to pt wanting to sign AMA form and he was ok with it form signed witnessed and in chart

## 2024-04-18 NOTE — Plan of Care (Signed)
   Problem: Education: Goal: Knowledge of General Education information will improve Description Including pain rating scale, medication(s)/side effects and non-pharmacologic comfort measures Outcome: Progressing   Problem: Health Behavior/Discharge Planning: Goal: Ability to manage health-related needs will improve Outcome: Progressing

## 2024-04-18 NOTE — Progress Notes (Deleted)
 PROGRESS NOTE    Amy Mendoza  FMW:[id number] DOB: 09-27-1980 DOA: 04/16/2024 PCP: Patient, No Pcp Per   Brief Narrative:  Amy Mendoza is a 43 y.o. female with medical history significant of diverticulitis, obesity status post gastric sleeve procedure, partial SBO, cholelithiasis s/p cholecystectomy, nephrolithiasis, GERD, asthma, hyperlipidemia, anxiety, chronic joint pain presented with complaining of epigastric and left lower quadrant pain that started on 04/14/2024.  Patient admitted for recurrent episode of diverticulitis, previous hospitalization earlier this summer for similar episode.  Imaging at intake concerning for microperforation.  Hospitalist called for admission, surgery called in consult.  Assessment & Plan:   Principal Problem:   Sigmoid diverticulitis Active Problems:   Acute diverticulitis  Acute sigmoid diverticulitis with possible microperforation Does not meet sepsis criteria - Recurrent, patient indicates this may be due to her phentermine as she has 1 to 2 weeks of constipation while on this medication - Continue supportive care, advance diet slowly per general surgery - Imaging initially concerning for diverticulitis with possible microperforation - Continue Zosyn  in the interim, labs otherwise reassuring with negative lactic acid and lipase   Obesity with BMI of 33: - Status post gastric sleeve surgery in the past GERD: On PPI as needed at home  Status post left elbow surgery/Chronic joint pain: - Takes gabapentin as needed Cholelithiasis status postcholecystectomy - stable  Small fat-containing umbilical hernia -incidentally noted on CT.   DVT prophylaxis: SCDs Start: 04/17/24 0825 enoxaparin  (LOVENOX ) injection 40 mg Start: 04/17/24 0015 Code Status:   Code Status: Full Code  Family Communication: None present  Status is: Inpatient  Dispo: The patient is from: Home              Anticipated d/c is to: Home              Anticipated d/c  date is: 24 to 48 hours              Patient currently not medically stable for discharge  Consultants:  General Surgery  Procedures:  None  Antimicrobials:  Zosyn   Subjective: No acute issues or events overnight, abdominal pain improving, noted bowel movement, soft overnight.  Requesting advancement in diet which is certainly reasonable given her improvement, will confirm with general surgery.  Objective: Vitals:   04/17/24 1337 04/17/24 2133 04/18/24 0531 04/18/24 0534  BP: (!) 99/46 (!) 102/58  (!) 141/98  Pulse: 78 84 66 (!) 108  Resp:  15 15 15   Temp: 98.2 F (36.8 C) 97.9 F (36.6 C) 98.8 F (37.1 C) 98.2 F (36.8 C)  TempSrc: Oral Oral Oral Oral  SpO2: 98% 100% 100% 94%  Weight:      Height:        Intake/Output Summary (Last 24 hours) at 04/18/2024 0738 Last data filed at 04/18/2024 0600 Gross per 24 hour  Intake 1851.13 ml  Output --  Net 1851.13 ml   Filed Weights   04/16/24 1958  Weight: 102.1 kg    Examination:  General:  Pleasantly resting in bed, No acute distress. HEENT:  Normocephalic atraumatic.  Sclerae nonicteric, noninjected.  Extraocular movements intact bilaterally. Neck:  Without mass or deformity.  Trachea is midline. Lungs:  Clear to auscultate bilaterally without rhonchi, wheeze, or rales. Heart:  Regular rate and rhythm.  Without murmurs, rubs, or gallops. Abdomen:  Soft, minimally tender diffusely without PMI, no guarding or rebound. Extremities: Without cyanosis, clubbing, edema, or obvious deformity. Skin:  Warm and dry, no erythema.  Data Reviewed: I have  personally reviewed following labs and imaging studies  CBC: Recent Labs  Lab 04/16/24 2013 04/17/24 0446 04/17/24 0853 04/18/24 0424  WBC 8.8 7.0 6.3 6.6  HGB 13.9 13.0 12.7 11.9*  HCT 42.5 42.2 41.3 38.2  MCV 86.7 89.4 87.9 88.4  PLT 262 219 238 228   Basic Metabolic Panel: Recent Labs  Lab 04/16/24 2013 04/17/24 0446 04/18/24 0424  NA 135 136 135  K 3.7 3.9  4.3  CL 100 103 101  CO2 24 21* 25  GLUCOSE 100* 79 77  BUN 7 7 5*  CREATININE 0.73 0.74 0.77  CALCIUM 9.4 8.9 8.7*  MG  --  2.2  --   PHOS  --  3.5  --    GFR: Estimated Creatinine Clearance: 116.6 mL/min (by C-G formula based on SCr of 0.77 mg/dL). Liver Function Tests: Recent Labs  Lab 04/16/24 2013 04/17/24 0446  AST 15 16  ALT 15 12  ALKPHOS 83 73  BILITOT 1.4* 1.1  PROT 7.7 7.3  ALBUMIN 4.0 3.7   Recent Labs  Lab 04/16/24 2013 04/17/24 0853  LIPASE 21 24   Sepsis Labs: Recent Labs  Lab 04/17/24 0853 04/17/24 1136  LATICACIDVEN 1.4 1.0   No results found for this or any previous visit (from the past 240 hours).   Radiology Studies: CT ABDOMEN PELVIS W CONTRAST Result Date: 04/16/2024 CLINICAL DATA:  Left-sided abdominal pain, diarrhea EXAM: CT ABDOMEN AND PELVIS WITH CONTRAST TECHNIQUE: Multidetector CT imaging of the abdomen and pelvis was performed using the standard protocol following bolus administration of intravenous contrast. RADIATION DOSE REDUCTION: This exam was performed according to the departmental dose-optimization program which includes automated exposure control, adjustment of the mA and/or kV according to patient size and/or use of iterative reconstruction technique. CONTRAST:  OMNIPAQUE  IOHEXOL  300 MG/ML  SOLN COMPARISON:  01/13/2024 FINDINGS: Lower chest: No acute pleural or parenchymal lung disease. Hepatobiliary: No focal liver abnormality is seen. Status post cholecystectomy. No biliary dilatation. Pancreas: Unremarkable. No pancreatic ductal dilatation or surrounding inflammatory changes. Spleen: Normal in size without focal abnormality. Adrenals/Urinary Tract: Adrenal glands are unremarkable. Kidneys are normal, without renal calculi, focal lesion, or hydronephrosis. Bladder is unremarkable. Stomach/Bowel: No bowel obstruction or ileus. Normal appendix right lower quadrant. Diverticulosis of the descending and sigmoid colon, with long  segment sigmoid colonic wall thickening and pericolonic fat stranding consistent with acute diverticulitis. There is a punctate focus of extraluminal gas within the central pelvis image 64/2, with extensive fat stranding extending between the inflamed sigmoid colon and an adjacent loop of jejunum. Enterocolic fistula cannot be excluded. No fluid collection or abscess. Vascular/Lymphatic: No significant vascular findings are present. No enlarged abdominal or pelvic lymph nodes. Reproductive: Uterus and bilateral adnexa are unremarkable. Other: No free fluid or free intraperitoneal gas. Fat containing umbilical hernia. No bowel herniation. Musculoskeletal: No acute or destructive bony abnormalities. Reconstructed images demonstrate no additional findings. IMPRESSION: 1. Acute sigmoid diverticulitis. Punctate focus of extraluminal gas is seen within a region of mesenteric inflammation between the inflamed sigmoid colon and a loop of distal jejunum, which may reflect micro perforation or underlying enterocolic fistula. No fluid collection or abscess. 2. Stable small fat containing umbilical hernia. Electronically Signed   By: Ozell Daring M.D.   On: 04/16/2024 22:52        Scheduled Meds:  enoxaparin  (LOVENOX ) injection  40 mg Subcutaneous QHS   Continuous Infusions:  sodium chloride  75 mL/hr at 04/17/24 2301   piperacillin -tazobactam (ZOSYN )  IV 3.375 g (  04/18/24 0538)     LOS: 2 days    Time spent:    Amy JAYSON [place], DO [facility]  If 7PM-7AM, please contact night-coverage [email or web address]  04/18/2024, 7:38 AM

## 2024-04-18 NOTE — Progress Notes (Signed)
 Per pt she wanted writer to contact her doctor and ask if he can advance her diet to soft due to her being hungry she stated she has not eaten since Monday. I did contact doctor and he stated that he wanted surgery team to come and see her and to see if she will need surgery he said all he can do is advance her diet to full liquids he said he will not release her but if she left she will have to sign AMA papers and I relayed that back to pt and she stated to me if the surgery team is not here between 5/6 she will sign out AMA

## 2024-04-18 NOTE — Discharge Summary (Signed)
 DISCHARGE NOTE    Amy Mendoza  FMW:[id number] DOB: [birth year] DOA: 04/16/2024 PCP: Patient, No Pcp Per   Brief Narrative:  Amy Mendoza is a 43 y.o. female with medical history significant of diverticulitis, obesity status post gastric sleeve procedure, partial SBO, cholelithiasis s/p cholecystectomy, nephrolithiasis, GERD, asthma, hyperlipidemia, anxiety, chronic joint pain presented with complaining of epigastric and left lower quadrant pain that started on 04/14/2024.  Patient admitted for recurrent episode of diverticulitis, previous hospitalization earlier this summer for similar episode.  Imaging at intake concerning for microperforation.  Hospitalist called for admission, surgery called in consult.  Patient was being followed for acute diverticulitis secondary to profound constipation with concern for microperforation. She continued to have pain today - as such her diet was only slowly advanced to full liquid diet. She requested a 'regular diet' which we discussed was not appropriate given her pain levels. Unfortunately later this evening the patient left the hospital against medical advice. We will attempt to contact the patient and prescribe antibiotics to hopefully prevent any intra-abdominal infection. Would recommend follow up with PCP and if necessary return to the hospital if symptoms were to worsen.  Assessment & Plan:   Principal Problem:   Sigmoid diverticulitis Active Problems:   Acute diverticulitis  Acute sigmoid diverticulitis with possible microperforation Does not meet sepsis criteria - Recurrent, patient indicates this may be due to her phentermine as she has 1 to 2 weeks of constipation while on this medication - Imaging initially concerning for diverticulitis with possible microperforation - Labs otherwise reassuring with negative lactic acid and lipase   Obesity with BMI of 33: - Status post gastric sleeve surgery in the past GERD: On PPI as needed at  home  Status post left elbow surgery/Chronic joint pain: - Takes gabapentin as needed Cholelithiasis status postcholecystectomy - stable  Small fat-containing umbilical hernia -incidentally noted on CT.   DVT prophylaxis:  Code Status:   Code Status: Full Code  Family Communication: None present  Status is: Inpatient  Dispo: The patient is from: Home              Anticipated d/c is to: Home              Anticipated d/c date is: 24 to 48 hours              Patient currently not medically stable for discharge  Consultants:  General Surgery  Procedures:  None  Antimicrobials:  Zosyn   Subjective: No acute issues or events overnight, abdominal pain improving but not yet resolved.  Objective: Vitals:   04/17/24 2133 04/18/24 0531 04/18/24 0534 04/18/24 1317  BP: (!) 102/58  (!) 141/98 (!) 108/48  Pulse: 84 66 (!) 108 65  Resp: 15 15 15 18   Temp: 97.9 F (36.6 C) 98.8 F (37.1 C) 98.2 F (36.8 C) 98.6 F (37 C)  TempSrc: Oral Oral Oral   SpO2: 100% 100% 94% 100%  Weight:      Height:        Intake/Output Summary (Last 24 hours) at 04/18/2024 1906 Last data filed at 04/18/2024 1553 Gross per 24 hour  Intake 2064.75 ml  Output --  Net 2064.75 ml   Filed Weights   04/16/24 1958  Weight: 102.1 kg    Examination:  General:  Pleasantly resting in bed, No acute distress. HEENT:  Normocephalic atraumatic.  Sclerae nonicteric, noninjected.  Extraocular movements intact bilaterally. Neck:  Without mass or deformity.  Trachea is midline. Lungs:  Clear  to auscultate bilaterally without rhonchi, wheeze, or rales. Heart:  Regular rate and rhythm.  Without murmurs, rubs, or gallops. Abdomen:  Soft, minimally tender diffusely without PMI, no guarding or rebound. Extremities: Without cyanosis, clubbing, edema, or obvious deformity. Skin:  Warm and dry, no erythema.  Data Reviewed: I have personally reviewed following labs and imaging studies  CBC: Recent Labs  Lab  04/16/24 2013 04/17/24 0446 04/17/24 0853 04/18/24 0424  WBC 8.8 7.0 6.3 6.6  HGB 13.9 13.0 12.7 11.9*  HCT 42.5 42.2 41.3 38.2  MCV 86.7 89.4 87.9 88.4  PLT 262 219 238 228   Basic Metabolic Panel: Recent Labs  Lab 04/16/24 2013 04/17/24 0446 04/18/24 0424  NA 135 136 135  K 3.7 3.9 4.3  CL 100 103 101  CO2 24 21* 25  GLUCOSE 100* 79 77  BUN 7 7 5*  CREATININE 0.73 0.74 0.77  CALCIUM 9.4 8.9 8.7*  MG  --  2.2  --   PHOS  --  3.5  --    GFR: Estimated Creatinine Clearance: 116.6 mL/min (by C-G formula based on SCr of 0.77 mg/dL). Liver Function Tests: Recent Labs  Lab 04/16/24 2013 04/17/24 0446  AST 15 16  ALT 15 12  ALKPHOS 83 73  BILITOT 1.4* 1.1  PROT 7.7 7.3  ALBUMIN 4.0 3.7   Recent Labs  Lab 04/16/24 2013 04/17/24 0853  LIPASE 21 24   Sepsis Labs: Recent Labs  Lab 04/17/24 0853 04/17/24 1136  LATICACIDVEN 1.4 1.0   No results found for this or any previous visit (from the past 240 hours).   Radiology Studies: CT ABDOMEN PELVIS W CONTRAST Result Date: 04/16/2024 CLINICAL DATA:  Left-sided abdominal pain, diarrhea EXAM: CT ABDOMEN AND PELVIS WITH CONTRAST TECHNIQUE: Multidetector CT imaging of the abdomen and pelvis was performed using the standard protocol following bolus administration of intravenous contrast. RADIATION DOSE REDUCTION: This exam was performed according to the departmental dose-optimization program which includes automated exposure control, adjustment of the mA and/or kV according to patient size and/or use of iterative reconstruction technique. CONTRAST:  OMNIPAQUE  IOHEXOL  300 MG/ML  SOLN COMPARISON:  01/13/2024 FINDINGS: Lower chest: No acute pleural or parenchymal lung disease. Hepatobiliary: No focal liver abnormality is seen. Status post cholecystectomy. No biliary dilatation. Pancreas: Unremarkable. No pancreatic ductal dilatation or surrounding inflammatory changes. Spleen: Normal in size without focal abnormality.  Adrenals/Urinary Tract: Adrenal glands are unremarkable. Kidneys are normal, without renal calculi, focal lesion, or hydronephrosis. Bladder is unremarkable. Stomach/Bowel: No bowel obstruction or ileus. Normal appendix right lower quadrant. Diverticulosis of the descending and sigmoid colon, with long segment sigmoid colonic wall thickening and pericolonic fat stranding consistent with acute diverticulitis. There is a punctate focus of extraluminal gas within the central pelvis image 64/2, with extensive fat stranding extending between the inflamed sigmoid colon and an adjacent loop of jejunum. Enterocolic fistula cannot be excluded. No fluid collection or abscess. Vascular/Lymphatic: No significant vascular findings are present. No enlarged abdominal or pelvic lymph nodes. Reproductive: Uterus and bilateral adnexa are unremarkable. Other: No free fluid or free intraperitoneal gas. Fat containing umbilical hernia. No bowel herniation. Musculoskeletal: No acute or destructive bony abnormalities. Reconstructed images demonstrate no additional findings. IMPRESSION: 1. Acute sigmoid diverticulitis. Punctate focus of extraluminal gas is seen within a region of mesenteric inflammation between the inflamed sigmoid colon and a loop of distal jejunum, which may reflect micro perforation or underlying enterocolic fistula. No fluid collection or abscess. 2. Stable small fat containing umbilical hernia.  Electronically Signed   By: Ozell Daring M.D.   On: 04/16/2024 22:52        Scheduled Meds:  enoxaparin  (LOVENOX ) injection  40 mg Subcutaneous QHS   Continuous Infusions:  sodium chloride  75 mL/hr at 04/18/24 1259   piperacillin -tazobactam (ZOSYN )  IV 3.375 g (04/18/24 1303)     LOS: 2 days    Time spent:    Elsie JAYSON [place], DO [facility]  If 7PM-7AM, please contact night-coverage [email or web address]  04/18/2024, 7:06 PM

## 2024-06-24 ENCOUNTER — Encounter: Payer: Self-pay | Admitting: Internal Medicine

## 2024-06-25 ENCOUNTER — Ambulatory Visit: Payer: Self-pay | Attending: *Deleted | Admitting: *Deleted

## 2024-06-25 ENCOUNTER — Encounter: Payer: Self-pay | Admitting: Internal Medicine

## 2024-06-25 ENCOUNTER — Encounter: Payer: Self-pay | Admitting: *Deleted

## 2024-06-25 ENCOUNTER — Telehealth: Payer: Self-pay | Admitting: Internal Medicine

## 2024-06-25 VITALS — BP 115/75 | HR 95 | Temp 98.4°F | Ht 69.0 in | Wt 230.0 lb

## 2024-06-25 DIAGNOSIS — R6889 Other general symptoms and signs: Secondary | ICD-10-CM

## 2024-06-25 LAB — POC COVID19/FLU A&B COMBO
Covid Antigen, POC: NEGATIVE
Influenza A Antigen, POC: NEGATIVE
Influenza B Antigen, POC: NEGATIVE

## 2024-06-25 MED ORDER — ALBUTEROL SULFATE HFA 108 (90 BASE) MCG/ACT IN AERS: 2.0000 | INHALATION_SPRAY | Freq: Four times a day (QID) | RESPIRATORY_TRACT | 0 refills | Status: AC | PRN

## 2024-06-25 NOTE — Patient Instructions (Signed)
 Seen today to r/o flu.  Flu swab was negative. Rest and fluids. Albuterol  for hx of asthma. Call if symptoms persist or worsen

## 2024-06-25 NOTE — Telephone Encounter (Signed)
 Noted! Thank you

## 2024-06-25 NOTE — Progress Notes (Signed)
° °  Acute Office Visit  Subjective:    Patient ID: Amy Mendoza, female    DOB: [birth year], 43 y.o.   MRN: [id number]  X 2 days hacky cough.  The cough in non productive and getting better.  The cough hurts her throat and is not associated with SOB or wheezing.  She feels a tickle in the throat which causes a cough.  Today, her cough is gone.  She has been sneezing and felt like she was getting a head cold.  She self treated with delsym , hot tea, sudafed, honey and the humidifier.    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  She would like a refill.   She called out for 2 days so she needs note for work.         ROS Negative except as noted      Objective:   Appears alert and oriented. Skin warm and dry.  Sinus non tender.  Nares clear.  Lungs clear.  Heart RRR.         Assessment & Plan:   Assessment & Plan  R/O flu (swab - for flu and covid    No follow-ups on file.  SCARLETT RONAL DEL, NP

## 2024-06-25 NOTE — Telephone Encounter (Signed)
 Pt sent Mychart message with acute respiratory symptoms. Please get her in today with any provider including offsite if needed. Thanks
